# Patient Record
Sex: Male | Born: 1980 | Race: White | Hispanic: No | Smoking: Current every day smoker
Health system: Southern US, Community
[De-identification: ages and names within clinical notes are randomized; demographics above are authoritative.]

## PROBLEM LIST (undated history)

## (undated) VITALS — BP 118/102 | HR 152 | Temp 97.5°F | Resp 20 | Ht 72.0 in | Wt 200.0 lb

## (undated) DIAGNOSIS — J45909 Unspecified asthma, uncomplicated: Secondary | ICD-10-CM

## (undated) DIAGNOSIS — G4730 Sleep apnea, unspecified: Secondary | ICD-10-CM

## (undated) DIAGNOSIS — F329 Major depressive disorder, single episode, unspecified: Secondary | ICD-10-CM

## (undated) DIAGNOSIS — K219 Gastro-esophageal reflux disease without esophagitis: Secondary | ICD-10-CM

## (undated) DIAGNOSIS — I219 Acute myocardial infarction, unspecified: Secondary | ICD-10-CM

## (undated) DIAGNOSIS — E119 Type 2 diabetes mellitus without complications: Secondary | ICD-10-CM

## (undated) DIAGNOSIS — M1990 Unspecified osteoarthritis, unspecified site: Secondary | ICD-10-CM

## (undated) DIAGNOSIS — F419 Anxiety disorder, unspecified: Secondary | ICD-10-CM

## (undated) DIAGNOSIS — E785 Hyperlipidemia, unspecified: Secondary | ICD-10-CM

## (undated) DIAGNOSIS — F603 Borderline personality disorder: Secondary | ICD-10-CM

## (undated) DIAGNOSIS — Q249 Congenital malformation of heart, unspecified: Secondary | ICD-10-CM

## (undated) DIAGNOSIS — T148XXA Other injury of unspecified body region, initial encounter: Secondary | ICD-10-CM

## (undated) DIAGNOSIS — R569 Unspecified convulsions: Secondary | ICD-10-CM

## (undated) DIAGNOSIS — M272 Inflammatory conditions of jaws: Secondary | ICD-10-CM

## (undated) DIAGNOSIS — F319 Bipolar disorder, unspecified: Secondary | ICD-10-CM

## (undated) DIAGNOSIS — I10 Essential (primary) hypertension: Secondary | ICD-10-CM

## (undated) DIAGNOSIS — F32A Depression, unspecified: Secondary | ICD-10-CM

## (undated) DIAGNOSIS — I2510 Atherosclerotic heart disease of native coronary artery without angina pectoris: Secondary | ICD-10-CM

## (undated) HISTORY — PX: TONSILLECTOMY: SUR1361

## (undated) HISTORY — PX: OTHER SURGICAL HISTORY: SHX169

## (undated) HISTORY — PX: UVULECTOMY: SHX2631

## (undated) HISTORY — PX: APPENDECTOMY: SHX54

## (undated) HISTORY — PX: CARDIAC CATHETERIZATION: SHX172

---

## 2001-09-09 ENCOUNTER — Encounter: Payer: Self-pay | Admitting: Emergency Medicine

## 2001-09-09 ENCOUNTER — Emergency Department (HOSPITAL_COMMUNITY): Admission: EM | Admit: 2001-09-09 | Discharge: 2001-09-09 | Payer: Self-pay | Admitting: Emergency Medicine

## 2001-12-01 ENCOUNTER — Ambulatory Visit (HOSPITAL_COMMUNITY): Admission: RE | Admit: 2001-12-01 | Discharge: 2001-12-01 | Payer: Self-pay | Admitting: Orthopaedic Surgery

## 2001-12-01 ENCOUNTER — Encounter: Payer: Self-pay | Admitting: Orthopaedic Surgery

## 2002-05-04 ENCOUNTER — Encounter: Payer: Self-pay | Admitting: Emergency Medicine

## 2002-05-04 ENCOUNTER — Emergency Department (HOSPITAL_COMMUNITY): Admission: EM | Admit: 2002-05-04 | Discharge: 2002-05-04 | Payer: Self-pay | Admitting: Emergency Medicine

## 2002-05-07 ENCOUNTER — Emergency Department (HOSPITAL_COMMUNITY): Admission: EM | Admit: 2002-05-07 | Discharge: 2002-05-07 | Payer: Self-pay | Admitting: Emergency Medicine

## 2002-05-07 ENCOUNTER — Encounter: Payer: Self-pay | Admitting: Emergency Medicine

## 2003-02-23 ENCOUNTER — Encounter: Payer: Self-pay | Admitting: Emergency Medicine

## 2003-02-23 ENCOUNTER — Emergency Department (HOSPITAL_COMMUNITY): Admission: EM | Admit: 2003-02-23 | Discharge: 2003-02-23 | Payer: Self-pay | Admitting: Emergency Medicine

## 2003-02-24 ENCOUNTER — Emergency Department (HOSPITAL_COMMUNITY): Admission: EM | Admit: 2003-02-24 | Discharge: 2003-02-24 | Payer: Self-pay | Admitting: Emergency Medicine

## 2003-02-25 ENCOUNTER — Emergency Department (HOSPITAL_COMMUNITY): Admission: EM | Admit: 2003-02-25 | Discharge: 2003-02-25 | Payer: Self-pay | Admitting: Emergency Medicine

## 2003-09-04 ENCOUNTER — Emergency Department (HOSPITAL_COMMUNITY): Admission: EM | Admit: 2003-09-04 | Discharge: 2003-09-04 | Payer: Self-pay | Admitting: Emergency Medicine

## 2004-08-02 ENCOUNTER — Emergency Department (HOSPITAL_COMMUNITY): Admission: EM | Admit: 2004-08-02 | Discharge: 2004-08-03 | Payer: Self-pay | Admitting: Emergency Medicine

## 2004-08-04 ENCOUNTER — Ambulatory Visit: Payer: Self-pay | Admitting: Orthopedic Surgery

## 2004-08-06 ENCOUNTER — Emergency Department (HOSPITAL_COMMUNITY): Admission: EM | Admit: 2004-08-06 | Discharge: 2004-08-06 | Payer: Self-pay | Admitting: Emergency Medicine

## 2004-10-12 ENCOUNTER — Emergency Department (HOSPITAL_COMMUNITY): Admission: EM | Admit: 2004-10-12 | Discharge: 2004-10-13 | Payer: Self-pay | Admitting: Emergency Medicine

## 2004-11-29 ENCOUNTER — Emergency Department (HOSPITAL_COMMUNITY): Admission: EM | Admit: 2004-11-29 | Discharge: 2004-11-29 | Payer: Self-pay | Admitting: Emergency Medicine

## 2005-03-01 ENCOUNTER — Emergency Department (HOSPITAL_COMMUNITY): Admission: EM | Admit: 2005-03-01 | Discharge: 2005-03-01 | Payer: Self-pay | Admitting: Emergency Medicine

## 2005-03-02 ENCOUNTER — Ambulatory Visit (HOSPITAL_COMMUNITY): Admission: RE | Admit: 2005-03-02 | Discharge: 2005-03-02 | Payer: Self-pay | Admitting: Emergency Medicine

## 2005-04-01 ENCOUNTER — Emergency Department (HOSPITAL_COMMUNITY): Admission: EM | Admit: 2005-04-01 | Discharge: 2005-04-01 | Payer: Self-pay | Admitting: Emergency Medicine

## 2005-04-06 ENCOUNTER — Emergency Department (HOSPITAL_COMMUNITY): Admission: EM | Admit: 2005-04-06 | Discharge: 2005-04-06 | Payer: Self-pay | Admitting: *Deleted

## 2005-05-11 ENCOUNTER — Inpatient Hospital Stay (HOSPITAL_COMMUNITY): Admission: RE | Admit: 2005-05-11 | Discharge: 2005-05-18 | Payer: Self-pay | Admitting: Psychiatry

## 2005-05-11 ENCOUNTER — Ambulatory Visit: Payer: Self-pay | Admitting: Psychiatry

## 2005-06-09 ENCOUNTER — Ambulatory Visit: Payer: Self-pay | Admitting: Family Medicine

## 2005-06-10 ENCOUNTER — Ambulatory Visit: Payer: Self-pay | Admitting: Family Medicine

## 2005-06-11 ENCOUNTER — Ambulatory Visit: Payer: Self-pay | Admitting: Family Medicine

## 2005-06-12 ENCOUNTER — Ambulatory Visit: Payer: Self-pay | Admitting: Family Medicine

## 2005-06-15 ENCOUNTER — Ambulatory Visit (HOSPITAL_COMMUNITY): Admission: RE | Admit: 2005-06-15 | Discharge: 2005-06-15 | Payer: Self-pay | Admitting: Family Medicine

## 2005-06-16 ENCOUNTER — Ambulatory Visit: Payer: Self-pay | Admitting: Family Medicine

## 2005-07-16 ENCOUNTER — Ambulatory Visit: Payer: Self-pay | Admitting: Family Medicine

## 2005-08-25 ENCOUNTER — Ambulatory Visit: Payer: Self-pay | Admitting: Family Medicine

## 2005-08-26 ENCOUNTER — Ambulatory Visit (HOSPITAL_COMMUNITY): Admission: RE | Admit: 2005-08-26 | Discharge: 2005-08-26 | Payer: Self-pay | Admitting: Family Medicine

## 2005-09-28 ENCOUNTER — Ambulatory Visit: Payer: Self-pay | Admitting: Family Medicine

## 2005-10-22 ENCOUNTER — Ambulatory Visit (HOSPITAL_COMMUNITY): Admission: RE | Admit: 2005-10-22 | Discharge: 2005-10-22 | Payer: Self-pay | Admitting: Otolaryngology

## 2005-10-30 ENCOUNTER — Ambulatory Visit (HOSPITAL_COMMUNITY): Admission: RE | Admit: 2005-10-30 | Discharge: 2005-10-30 | Payer: Self-pay | Admitting: Urology

## 2005-10-30 ENCOUNTER — Encounter (INDEPENDENT_AMBULATORY_CARE_PROVIDER_SITE_OTHER): Payer: Self-pay | Admitting: Urology

## 2005-11-26 ENCOUNTER — Inpatient Hospital Stay (HOSPITAL_COMMUNITY): Admission: RE | Admit: 2005-11-26 | Discharge: 2005-11-28 | Payer: Self-pay | Admitting: Otolaryngology

## 2005-12-02 ENCOUNTER — Emergency Department (HOSPITAL_COMMUNITY): Admission: EM | Admit: 2005-12-02 | Discharge: 2005-12-02 | Payer: Self-pay | Admitting: Emergency Medicine

## 2005-12-28 ENCOUNTER — Ambulatory Visit: Payer: Self-pay | Admitting: Family Medicine

## 2006-04-19 ENCOUNTER — Ambulatory Visit: Payer: Self-pay | Admitting: Family Medicine

## 2006-07-15 ENCOUNTER — Emergency Department (HOSPITAL_COMMUNITY): Admission: EM | Admit: 2006-07-15 | Discharge: 2006-07-15 | Payer: Self-pay | Admitting: Emergency Medicine

## 2006-10-14 ENCOUNTER — Emergency Department (HOSPITAL_COMMUNITY): Admission: EM | Admit: 2006-10-14 | Discharge: 2006-10-14 | Payer: Self-pay | Admitting: Emergency Medicine

## 2006-11-26 ENCOUNTER — Emergency Department (HOSPITAL_COMMUNITY): Admission: EM | Admit: 2006-11-26 | Discharge: 2006-11-26 | Payer: Self-pay | Admitting: Emergency Medicine

## 2006-12-05 ENCOUNTER — Emergency Department (HOSPITAL_COMMUNITY): Admission: EM | Admit: 2006-12-05 | Discharge: 2006-12-05 | Payer: Self-pay | Admitting: Emergency Medicine

## 2008-07-28 ENCOUNTER — Emergency Department (HOSPITAL_COMMUNITY): Admission: EM | Admit: 2008-07-28 | Discharge: 2008-07-28 | Payer: Self-pay | Admitting: Emergency Medicine

## 2008-07-29 ENCOUNTER — Emergency Department (HOSPITAL_COMMUNITY): Admission: EM | Admit: 2008-07-29 | Discharge: 2008-07-29 | Payer: Self-pay | Admitting: Emergency Medicine

## 2008-08-31 ENCOUNTER — Ambulatory Visit (HOSPITAL_COMMUNITY): Admission: RE | Admit: 2008-08-31 | Discharge: 2008-08-31 | Payer: Self-pay | Admitting: Family Medicine

## 2008-09-07 ENCOUNTER — Ambulatory Visit (HOSPITAL_COMMUNITY): Admission: RE | Admit: 2008-09-07 | Discharge: 2008-09-07 | Payer: Self-pay | Admitting: Family Medicine

## 2008-09-14 ENCOUNTER — Emergency Department (HOSPITAL_COMMUNITY): Admission: EM | Admit: 2008-09-14 | Discharge: 2008-09-14 | Payer: Self-pay | Admitting: Emergency Medicine

## 2008-10-11 ENCOUNTER — Encounter (HOSPITAL_COMMUNITY): Admission: RE | Admit: 2008-10-11 | Discharge: 2008-11-10 | Payer: Self-pay | Admitting: Endocrinology

## 2008-12-26 ENCOUNTER — Emergency Department (HOSPITAL_COMMUNITY): Admission: EM | Admit: 2008-12-26 | Discharge: 2008-12-26 | Payer: Self-pay | Admitting: Emergency Medicine

## 2009-01-03 ENCOUNTER — Encounter: Admission: RE | Admit: 2009-01-03 | Discharge: 2009-01-03 | Payer: Self-pay | Admitting: Endocrinology

## 2009-01-11 ENCOUNTER — Emergency Department (HOSPITAL_COMMUNITY): Admission: EM | Admit: 2009-01-11 | Discharge: 2009-01-11 | Payer: Self-pay | Admitting: Emergency Medicine

## 2009-01-27 ENCOUNTER — Emergency Department (HOSPITAL_COMMUNITY): Admission: EM | Admit: 2009-01-27 | Discharge: 2009-01-27 | Payer: Self-pay | Admitting: Emergency Medicine

## 2009-01-29 ENCOUNTER — Emergency Department (HOSPITAL_COMMUNITY): Admission: EM | Admit: 2009-01-29 | Discharge: 2009-01-29 | Payer: Self-pay | Admitting: Emergency Medicine

## 2009-03-04 ENCOUNTER — Emergency Department (HOSPITAL_COMMUNITY): Admission: EM | Admit: 2009-03-04 | Discharge: 2009-03-04 | Payer: Self-pay | Admitting: Emergency Medicine

## 2009-05-06 ENCOUNTER — Emergency Department (HOSPITAL_COMMUNITY): Admission: EM | Admit: 2009-05-06 | Discharge: 2009-05-07 | Payer: Self-pay | Admitting: Emergency Medicine

## 2009-05-07 ENCOUNTER — Ambulatory Visit: Payer: Self-pay | Admitting: *Deleted

## 2009-05-07 ENCOUNTER — Inpatient Hospital Stay (HOSPITAL_COMMUNITY): Admission: AD | Admit: 2009-05-07 | Discharge: 2009-05-13 | Payer: Self-pay | Admitting: *Deleted

## 2009-05-28 ENCOUNTER — Observation Stay (HOSPITAL_COMMUNITY): Admission: EM | Admit: 2009-05-28 | Discharge: 2009-05-29 | Payer: Self-pay | Admitting: Emergency Medicine

## 2009-06-01 ENCOUNTER — Inpatient Hospital Stay (HOSPITAL_COMMUNITY): Admission: AD | Admit: 2009-06-01 | Discharge: 2009-06-05 | Payer: Self-pay | Admitting: *Deleted

## 2009-08-29 ENCOUNTER — Encounter: Payer: Self-pay | Admitting: Family Medicine

## 2009-11-20 ENCOUNTER — Emergency Department (HOSPITAL_COMMUNITY): Admission: EM | Admit: 2009-11-20 | Discharge: 2009-11-20 | Payer: Self-pay | Admitting: Emergency Medicine

## 2009-12-12 ENCOUNTER — Emergency Department (HOSPITAL_COMMUNITY): Admission: EM | Admit: 2009-12-12 | Discharge: 2009-12-12 | Payer: Self-pay | Admitting: Emergency Medicine

## 2009-12-14 ENCOUNTER — Emergency Department (HOSPITAL_COMMUNITY): Admission: EM | Admit: 2009-12-14 | Discharge: 2009-12-14 | Payer: Self-pay | Admitting: Emergency Medicine

## 2009-12-17 ENCOUNTER — Emergency Department (HOSPITAL_COMMUNITY): Admission: EM | Admit: 2009-12-17 | Discharge: 2009-12-17 | Payer: Self-pay | Admitting: Emergency Medicine

## 2009-12-18 ENCOUNTER — Emergency Department (HOSPITAL_COMMUNITY): Admission: EM | Admit: 2009-12-18 | Discharge: 2009-12-19 | Payer: Self-pay | Admitting: Emergency Medicine

## 2009-12-19 ENCOUNTER — Inpatient Hospital Stay (HOSPITAL_COMMUNITY): Admission: AD | Admit: 2009-12-19 | Discharge: 2009-12-23 | Payer: Self-pay | Admitting: Psychiatry

## 2009-12-19 ENCOUNTER — Ambulatory Visit: Payer: Self-pay | Admitting: Psychiatry

## 2010-02-13 ENCOUNTER — Emergency Department (HOSPITAL_COMMUNITY): Admission: EM | Admit: 2010-02-13 | Discharge: 2010-02-13 | Payer: Self-pay | Admitting: Emergency Medicine

## 2010-10-12 ENCOUNTER — Encounter: Payer: Self-pay | Admitting: Emergency Medicine

## 2010-12-14 LAB — COMPREHENSIVE METABOLIC PANEL
AST: 40 U/L — ABNORMAL HIGH (ref 0–37)
BUN: 11 mg/dL (ref 6–23)
CO2: 30 mEq/L (ref 19–32)
Chloride: 102 mEq/L (ref 96–112)
Creatinine, Ser: 1.28 mg/dL (ref 0.4–1.5)
GFR calc Af Amer: >60 mL/min (ref >60)
GFR calc non Af Amer: >60 mL/min (ref >60)
Glucose, Bld: 92 mg/dL (ref 70–99)
Total Bilirubin: 0.5 mg/dL (ref 0.3–1.2)

## 2010-12-14 LAB — URINALYSIS, ROUTINE W REFLEX MICROSCOPIC
Bilirubin Urine: NEGATIVE
Ketones, ur: NEGATIVE mg/dL
Nitrite: NEGATIVE
Protein, ur: NEGATIVE mg/dL
Specific Gravity, Urine: 1.01 (ref 1.005–1.030)
Specific Gravity, Urine: 1.005 (ref 1.005–1.030)
Urobilinogen, UA: 0.2 mg/dL (ref 0.0–1.0)
Urobilinogen, UA: 0.2 mg/dL (ref 0.0–1.0)

## 2010-12-14 LAB — BASIC METABOLIC PANEL
Chloride: 105 mEq/L (ref 96–112)
GFR calc Af Amer: >60 mL/min (ref >60)
Potassium: 4.1 mEq/L (ref 3.5–5.1)

## 2010-12-14 LAB — CBC
HCT: 42.5 % (ref 39.0–52.0)
Hemoglobin: 15.3 g/dL (ref 13.0–17.0)
MCV: 96.9 fL (ref 78.0–100.0)
RBC: 4.39 MIL/uL (ref 4.22–5.81)
WBC: 10.5 10*3/uL (ref 4.0–10.5)

## 2010-12-14 LAB — RAPID URINE DRUG SCREEN, HOSP PERFORMED
Barbiturates: NOT DETECTED
Opiates: POSITIVE — AB
Opiates: NOT DETECTED
Tetrahydrocannabinol: NOT DETECTED

## 2010-12-14 LAB — DIFFERENTIAL
Basophils Absolute: 0 10*3/uL (ref 0.0–0.1)
Eosinophils Relative: 1 % (ref 0–5)
Lymphocytes Relative: 15 % (ref 12–46)
Neutrophils Relative %: 78 % — ABNORMAL HIGH (ref 43–77)

## 2010-12-14 LAB — LITHIUM LEVEL: Lithium Lvl: 0.54 mEq/L — ABNORMAL LOW (ref 0.80–1.40)

## 2010-12-26 LAB — BASIC METABOLIC PANEL
Calcium: 8.8 mg/dL (ref 8.4–10.5)
GFR calc Af Amer: >60 mL/min (ref >60)
GFR calc non Af Amer: >60 mL/min (ref >60)
Sodium: 139 mEq/L (ref 135–145)

## 2010-12-26 LAB — CBC
Hemoglobin: 15.1 g/dL (ref 13.0–17.0)
RBC: 4.24 MIL/uL (ref 4.22–5.81)

## 2010-12-26 LAB — T3, FREE: T3, Free: 2.9 pg/mL (ref 2.3–4.2)

## 2010-12-26 LAB — DIFFERENTIAL
Basophils Absolute: 0 10*3/uL (ref 0.0–0.1)
Lymphocytes Relative: 23 % (ref 12–46)
Monocytes Absolute: 0.8 10*3/uL (ref 0.1–1.0)
Monocytes Relative: 10 % (ref 3–12)
Neutro Abs: 5.3 10*3/uL (ref 1.7–7.7)

## 2010-12-26 LAB — T4, FREE: Free T4: 0.59 ng/dL — ABNORMAL LOW (ref 0.80–1.80)

## 2010-12-27 LAB — CBC
HCT: 45.6 % (ref 39.0–52.0)
Hemoglobin: 16.1 g/dL (ref 13.0–17.0)
MCHC: 35.4 g/dL (ref 30.0–36.0)
MCV: 99 fL (ref 78.0–100.0)
Platelets: 155 10*3/uL (ref 150–400)
RBC: 4.6 MIL/uL (ref 4.22–5.81)
RDW: 12.9 % (ref 11.5–15.5)
WBC: 8.4 10*3/uL (ref 4.0–10.5)

## 2010-12-27 LAB — URINALYSIS, ROUTINE W REFLEX MICROSCOPIC
Bilirubin Urine: NEGATIVE
Nitrite: NEGATIVE
Specific Gravity, Urine: >1.03 — ABNORMAL HIGH (ref 1.005–1.030)
pH: 6 (ref 5.0–8.0)

## 2010-12-27 LAB — BASIC METABOLIC PANEL
BUN: 11 mg/dL (ref 6–23)
CO2: 27 mEq/L (ref 19–32)
Chloride: 104 mEq/L (ref 96–112)
GFR calc non Af Amer: >60 mL/min (ref >60)
Glucose, Bld: 87 mg/dL (ref 70–99)
Potassium: 4 mEq/L (ref 3.5–5.1)

## 2010-12-27 LAB — RAPID URINE DRUG SCREEN, HOSP PERFORMED
Amphetamines: POSITIVE — AB
Barbiturates: NOT DETECTED
Benzodiazepines: NOT DETECTED
Cocaine: NOT DETECTED
Opiates: NOT DETECTED
Tetrahydrocannabinol: POSITIVE — AB

## 2010-12-27 LAB — DIFFERENTIAL
Basophils Absolute: 0 10*3/uL (ref 0.0–0.1)
Basophils Relative: 0 % (ref 0–1)
Eosinophils Absolute: 0 10*3/uL (ref 0.0–0.7)
Eosinophils Relative: 1 % (ref 0–5)
Monocytes Absolute: 0.6 10*3/uL (ref 0.1–1.0)

## 2010-12-27 LAB — ETHANOL

## 2010-12-30 LAB — BASIC METABOLIC PANEL
BUN: 10 mg/dL (ref 6–23)
Calcium: 9.6 mg/dL (ref 8.4–10.5)
Creatinine, Ser: 1.08 mg/dL (ref 0.4–1.5)
GFR calc Af Amer: >60 mL/min (ref >60)

## 2010-12-30 LAB — POCT CARDIAC MARKERS
CKMB, poc: 1.3 ng/mL (ref 1.0–8.0)
Troponin i, poc: <0.05 ng/mL (ref 0.00–0.09)

## 2010-12-31 LAB — DIFFERENTIAL
Basophils Relative: 0 % (ref 0–1)
Eosinophils Absolute: 0.1 10*3/uL (ref 0.0–0.7)
Eosinophils Relative: 1 % (ref 0–5)
Lymphs Abs: 1.6 10*3/uL (ref 0.7–4.0)
Monocytes Absolute: 0.4 10*3/uL (ref 0.1–1.0)
Monocytes Relative: 6 % (ref 3–12)

## 2010-12-31 LAB — POCT I-STAT, CHEM 8
BUN: <3 mg/dL — ABNORMAL LOW (ref 6–23)
Calcium, Ion: 1.17 mmol/L (ref 1.12–1.32)
Chloride: 107 mEq/L (ref 96–112)
HCT: 47 % (ref 39.0–52.0)
Sodium: 143 mEq/L (ref 135–145)

## 2010-12-31 LAB — COMPREHENSIVE METABOLIC PANEL
ALT: 28 U/L (ref 0–53)
AST: 28 U/L (ref 0–37)
Albumin: 4.3 g/dL (ref 3.5–5.2)
Alkaline Phosphatase: 67 U/L (ref 39–117)
Calcium: 9.5 mg/dL (ref 8.4–10.5)
GFR calc Af Amer: >60 mL/min (ref >60)
Glucose, Bld: 94 mg/dL (ref 70–99)
Potassium: 3.9 mEq/L (ref 3.5–5.1)
Sodium: 141 mEq/L (ref 135–145)
Total Protein: 7 g/dL (ref 6.0–8.3)

## 2010-12-31 LAB — CBC
Hemoglobin: 16.1 g/dL (ref 13.0–17.0)
MCHC: 35.6 g/dL (ref 30.0–36.0)
Platelets: 152 10*3/uL (ref 150–400)
RDW: 12.7 % (ref 11.5–15.5)

## 2010-12-31 LAB — POCT CARDIAC MARKERS
CKMB, poc: <1 ng/mL — ABNORMAL LOW (ref 1.0–8.0)
Troponin i, poc: <0.05 ng/mL (ref 0.00–0.09)

## 2010-12-31 LAB — TRICYCLICS SCREEN, URINE: TCA Scrn: NOT DETECTED

## 2010-12-31 LAB — URINE CULTURE: Culture: NO GROWTH

## 2010-12-31 LAB — ETHANOL: Alcohol, Ethyl (B): 139 mg/dL — ABNORMAL HIGH (ref 0–10)

## 2010-12-31 LAB — RAPID URINE DRUG SCREEN, HOSP PERFORMED
Barbiturates: NOT DETECTED
Cocaine: NOT DETECTED

## 2010-12-31 LAB — URINALYSIS, ROUTINE W REFLEX MICROSCOPIC
Glucose, UA: NEGATIVE mg/dL
Ketones, ur: NEGATIVE mg/dL
Nitrite: NEGATIVE
Protein, ur: NEGATIVE mg/dL
pH: 7 (ref 5.0–8.0)

## 2010-12-31 LAB — D-DIMER, QUANTITATIVE: D-Dimer, Quant: <0.22 ug/mL-FEU (ref 0.00–0.48)

## 2011-02-03 NOTE — Discharge Summary (Signed)
[name], Jermaine [name] NO.:  [id number]   MEDICAL RECORD NO.:  [id number]          PATIENT TYPE:  IPS   LOCATION:  0501                          FACILITY:  [facility]   PHYSICIAN:  Anselm Jungling, MD  DATE OF BIRTH:  [birth year]   DATE OF ADMISSION:  05/07/2009  DATE OF DISCHARGE:  05/13/2009                               DISCHARGE SUMMARY   IDENTIFYING DATA AND REASON FOR ADMISSION:  This was an inpatient  psychiatric admission for Jermaine [name], a 30 year old married Caucasian male,  who presented for stabilization of severe mood disturbance, including  depression and extreme anger management problems.  Please refer to the  admission note for further details pertaining to the symptoms,  circumstances. and history that led to his hospitalization.  He was  given an initial Axis I diagnosis of mood disorder NOS, cannabis  dependence, and rule out bipolar disorder NOS.   MEDICAL AND LABORATORY:  The patient was medically and physically  assessed by the psychiatric nurse practitioner.  He was in good health  without any active or chronic medical problems.  There were no  significant medical issues.   HOSPITAL COURSE:  The patient was admitted to the adult inpatient  psychiatric service.  He presented as a well-nourished, normally-  developed male who was alert, fully oriented, and depressed, but without  any active suicidal ideation, plan or intent.  He stated that he has  been depressed all of his life.  He also admitted to extreme  irritability, inappropriately rageful responses, and asked for help with  this.  There were no signs or symptoms of psychosis.  He admitted to  regular cannabis use, which he stated was helpful to his mood.   The patient consented to a trial of medication to address what appeared  to be an atypical bipolar disorder characterized by severe anger and  irritability as well as depression.  He responded reasonably well to a  trial of Seroquel, in  combination with Depakote.  He had come to [place] on a  trial of Zoloft from his outside [place] [facility].  This was discontinued, as  there was concern that it might have been increasing his level of  irritability as well as contributing to his admission insomnia.   He participated in therapeutic groups and activities.  His level of  irritability diminished significantly during his stay.  There was a  family session involving his girlfriend and mother, which was fairly  beneficial even though the patient's irritation did come out at times  during that session.   On the 7th hospital day, the patient appeared appropriate for discharge.  He agreed to the following aftercare plan.  He was absent suicidal  ideation at the time of discharge.   AFTERCARE:  The patient was to follow up at [facility] with an appointment on May 15, 2009, at 8:00 a.m.   DISCHARGE MEDICATIONS:  1. Seroquel 100 mg q.a.m. and 200 mg q.h.s.  2. Inderal 80 mg b.i.d.  3. Depakote 750 mg b.i.d.  4. Zyprexa Zydis 5 mg q.6 h as needed  for agitation.   He was instructed to stop taking Zoloft.   He was instructed to also consult with his usual medical physician about  his history of intermittent hypertension and the advisability of  continued use of Inderal for this.   DISCHARGE DIAGNOSES:  AXIS I:  Bipolar disorder, atypical, NOS, and  cannabis abuse.  AXIS II:  Deferred.  AXIS III:  Hypertension.  AXIS IV:  Stressors severe.  AXIS V:  GAF on discharge 50.      Anselm Jungling, MD  Electronically Signed     SPB/MEDQ  D:  05/15/2009  T:  05/15/2009  Job:  [id number]

## 2011-02-06 NOTE — Op Note (Signed)
Jermaine [name], Jermaine [name]                ACCOUNT NO.:  [id number]   MEDICAL RECORD NO.:  [id number]          PATIENT TYPE:  AMB   LOCATION:  SDS                          FACILITY:  [facility]   PHYSICIAN:  Zola Button T. Wolicki, M.D. DATE OF BIRTH:  [birth year]   DATE OF PROCEDURE:  11/26/2005  DATE OF DISCHARGE:                                 OPERATIVE REPORT   PREOPERATIVE DIAGNOSIS:  Nasal septal deviation with obstruction,  hypertrophic inferior turbinates with obstruction, hypertrophic tonsils with  obstruction, obstructive sleep apnea.   POSTOPERATIVE DIAGNOSIS:  Nasal septal deviation with obstruction,  hypertrophic inferior turbinates with obstruction, hypertrophic tonsils with  obstruction, obstructive sleep apnea.   PROCEDURE PERFORMED:  1.  Nasal septoplasty.  2.  Bilateral submucous resection of inferior turbinates.  3.  Lysis, intranasal synechia.  4.  Uvulopalatal pharyngoplasty.  5.  Tonsillectomy.   SURGEON:  Gloris Manchester. Lazarus Salines, M.D.   ANESTHESIA:  General orotracheal.   BLOOD LOSS:  Minimal.   COMPLICATIONS:  None.   FINDINGS:  An overall mildly rightward deviated nasal septum with some heavy  synechial bands between the septum and the left inferior turbinate. Bulky  inferior turbinates bilaterally.  3+ tonsils with a long thick but otherwise  normal soft palate.  No residual adenoids.   PROCEDURE:  With the patient in the comfortable supine position, general  orotracheal anesthesia was induced without difficulty. At an appropriate  level, the table was turned 90 degrees and the patient placed in a slight  sitting position.  A saline moistened throat pack was placed. Nasal  vibrissae were trimmed.  4% cocaine solution, 160 mg total, was applied on  1/2 by 3 inch cottonoids to both sides of the nasal septum. Cocaine  crystals, 200 mg total, were applied on cotton carriers to the anterior  ethmoid and sphenopalatine ganglion regions. Finally, 1% Xylocaine with  1:100,000  epinephrine, 10 mL total, was infiltrated into the anterior floor  of the nose, into the nasal spine region, into the membranous columella, and  into the anterior pole of the inferior turbinates, and into the  submucoperichondrial plane of the septum on both sides.  Several minutes  were allowed for this to take effect.  A sterile preparation and draping of  the midface was accomplished.   The materials were removed.  The nose appeared to be intact and correct in  number. The findings were as described above.  3-4 synechial bands in the  left nose were lysed on the turbinate side, leaving adequate mucosa on the  septal side to avoid perforation.   Two floor incisions were made anteriorly and on the left side, it was  continued up as a hemitransfixion incision.  Floor tunnels were elevated  posteriorly and brought medially to the maxillary crest and then brought  forward.  The submucoperichondrial plane of the left septum was elevated up  to the dorsum of the nose, back onto the perpendicular plate, and brought  down to the floor of the nose and communicated with the floor tunnel and  brought forward.  The septal flap was raised intact.  The chondroethmoid  junction was identified and opened with a Risk analyst.  The opposite  submucoperiosteal plane of the perpendicular plate was elevated back to the  dorsum and carried down and communicated with the right floor tunnel.  The  superior perpendicular plate was lysed with open Jansen-Middleton forceps.  The inferior portion was dissected free, rocked, delivered, and discarded.  Bony spicules were carefully removed and there was a small spur on the left  side posteriorly.  At this point, a 2 mm strip of the inferior quadrangular  cartilage was incised and submucosally resected allowing the septum to  trapdoor into the midline more freely.  A small portion of the posterior  inferior corner of the quadrangular cartilage was submucosally  resected  where it was spurred to the right.  This left approximately 2.5 cm dorsal  and caudal struts.  The maxillary crest posterior to the caudal strut was  resected using a mallet and osteotome.  At this point,  the septum was  straight and easily brought into the midline, it was secured to the nasal  spine with figure-of-eight 4-0 PDS suture.  The septal tunnels were  carefully suctioned free, the flaps were laid back down intact, and the  several incisions were closed with interrupted 4-0 chromic suture.   Just prior to completing the septoplasty, the inferior turbinates were  infiltrated with a total of 6 mL of 1% Xylocaine with 1:100,000 epinephrine.  After completing the septoplasty, beginning on the right side, the anterior  hood of the inferior turbinate was lysed just behind the nasal valve. The  medial mucosa was incised in an anterior upsloping fashion and a laterally  based flap was developed.  The turbinate was infractured.  Using angled  turbinate scissors, the turbinate bone and lateral mucosa was resected in a  posterior downsloping fashion taking virtually all the anterior pole leaving  virtually all the posterior pole. Additional bony spicules were dissected  and removed. The bulbous posterior pole was suction coagulated as were the  cut mucosal edges. The flap was laid back down, the turbinate was  outfractured, and this side was completed.  After completing the right side,  the left side was done in identical fashion.   At this point, 0.040 reinforced Silastic splints were fashioned and placed  against the septum on both sides and secured thereto with a 3-0 nylon  stitch. A double thickness Neosporin impregnated Telfa pack was placed along  the inferior turbinate on each side. A 6.5 mm nasal trumpet which had been  shortened to reach just into the nasopharynx was placed in each side to allow some postoperative nasal airway. This completed the nasal portion of  the  procedure. Hemostasis was observed.   At this point, the patient was flattened and then placed in Trendelenburg.  The pharynx was suctioned clean and the throat pack was removed. Taking care  to protect lips, teeth, and endotracheal tube, the Crowe-Davis mouth gag was  introduced, expanded for visualization, and suspended from the Mayo stand in  the standard fashion. The findings were as described above. Palate retractor  and mirror were used to visualize the nasopharynx with no significant  residual adenoids. 0.5% Xylocaine with 1:200,000 epinephrine, 10 mL total  was infiltrated into the peritonsillar planes and into the soft palate. The  midline palatal tattoo mark placed in the office was identified.   After allowing several minutes for hemostasis to take effect, the cautery  was used to mark an incision  along the anterior tonsillar pillar on both  sides vertically and then communicating across the palate in an arch  fashion. An inverted V was planned around the tattoo mark to prevent scar  contracture.   Beginning on the left side, the tonsil was grasped and retracted medially.  Working through the previously marked incision, the dissection was carried  down through the anterior pillar to the capsule of the tonsil at which point  the dissection was carried on the capsule. The cautery tip was used with a  blunt dissector, fibrous bands were lysed, and crossing vessels were  coagulated as identified. The tonsil was dissected first at its inferior  pole and then brought upwards into the soft palate following the previously  marked arch but remaining by the soft palatal tissues. After freeing the  left tonsil, the right side was done in identical fashion.   Working across the arch of the soft palate, the dissection was carried out  with Bovie cautery directly through the palatal soft tissues and then  working back and forth from each tonsil up onto the posterior surface. An  elongated  flap of mucosa was left on the posterior surface of the uvula to  be brought forward. Hemostasis was observed. The two tonsils and the soft  palate were resected en block and sent for gross interpretation.   At this point, the posterior uvular mucosa was tucked into the anterior  Chevron with interrupted 4-0 Vicryl sutures. The posterior mucosa of the  soft palate was brought up and tethered slightly laterally. A relaxing  incision was made on the posterior mucosal surface of the soft palate to  allow the posterior pillars to be brought more laterally. The free edge of  the soft palate was closed with interrupted 4-0 Vicryl sutures and the  tonsil fossae were closed with the same material.  A good closure with very  slight horizontal banding of the posterior pharyngeal mucosa was  accomplished.  Mirror examination revealed a good configuration to the free  edge of the soft palate. Hemostasis was observed.  At this point, the Crowe-Davis gag was relaxed for several minutes. Upon re-  expansion, hemostasis was persistent. At this point the procedure was  completed. The mouth gag was relaxed and removed. The dental status was  intact. The patient was returned to anesthesia, awakened, extubated, and  transferred to recovery in stable condition.   COMMENT:  31 year old white male with a history of loud snoring, nasal  obstruction, and documented sleep apnea was indication for several  components of today's procedure. Anticipated routine postoperative recovery  with attention to analgesia, antibiosis, hydration, and observation for  bleeding, emesis, and airway compromise. Given the nature of the surgery on  both the oral and nasal airway as well as the patient's history of sleep  apnea, will observe him in one of the step-down units tonight. Anticipate  removal of packs in the morning and discharge home when he is able to  breathe adequately, eat and drink adequately, and achieve adequate  pain  control.      Gloris Manchester. Lazarus Salines, M.D.  Electronically Signed     KTW/MEDQ  D:  11/26/2005  T:  11/26/2005  Job:  [id number]   cc:   Dorthula Rue. Early Chars, MD  Fax: [phone]   Dani Gobble, MD  Fax: [phone]   Kofi A. Gerilyn Pilgrim, M.D.  Fax: [phone]

## 2011-02-06 NOTE — H&P (Signed)
NAMENILO, FALLIN NO.:  [id number]   MEDICAL RECORD NO.:  [id number]          PATIENT TYPE:  IPS   LOCATION:  0306                          [facility]ILITY[facility]  [facility]   PHYSICIAN:  Jermaine [name], M.D.      DATE OF BIRTH:  [birth year]   DATE OF ADMISSION:  05/11/2005  DATE OF DISCHARGE:                         PSYCHIATRIC ADMISSION ASSESSMENT   IDENTIFYING INFORMATION:  A 30 year old married white male, voluntarily  admitted on May 11, 2005.   HISTORY OF PRESENT ILLNESS:  The patient presents with a history of  depression and suicidal thoughts with a plan to cut his wrists.  The patient  has been using crack cocaine for the past 2 years, drinking alcohol and  smoking marijuana.  He has been drinking 6-12 beers daily.  His last drink  was on May 10, 2005.  The patient states that he keeps thinking about  killing himself, and recently cut himself with a razor blade.  He reports  severe mood swings.  He feels that he is just ready to explode.  The patient  reports only sleeping a few hours at a time.  His appetite has been  satisfactory, denies any psychotic symptoms.   PAST PSYCHIATRIC HISTORY:  First admission to [facility], has  a history of cutting his wrists at age of 51, has a history of bipolar  disorder and ADHD.   SOCIAL HISTORY:  He is a 30 year old married white male, has 2 children,  ages 70 and 1 year of age.  Lives with his wife and children.  He is  unemployed for the past month.  Has a DUI pending and driving with a revoked  license.   FAMILY HISTORY:  Sister with bipolar, mother is depressed.   ALCOHOL DRUG HISTORY:  The patient smokes, he reports drinking in the  morning, with blackouts, denies any seizure activity, and has been smoking  marijuana and crack cocaine.   PAST MEDICAL HISTORY:  Primary care Alyana Kreiter is none, medical problems are  hypertension, acid reflux and asthma.   MEDICATIONS:  Takes an albuterol inhaler, reports  no hypertension  medications at this time.   DRUG ALLERGIES:  No known allergies.   PHYSICAL EXAMINATION:  The patient was assessed at [facility].  He is a well-  nourished male, well-developed, in no acute distress.  He does have multiple  tattoos.  He has a healed scar to his right wrist and some superficial  lacerations to his left forearm where he states he cut himself.  Temperature  is 98.3, 93 heart rate, 18 respirations, blood pressure is 140/110.  Urine  drug screen is positive for cocaine, THC and benzodiazepines.  CBC is within  normal limits.  Alcohol level is 38.  His BMET was within normal limits.   MENTAL STATUS EXAM:  He is a fully alert male, cooperative, good eye  contact.  Speech is clear, normal pace and tone.  The patient feels anxious  and tense.  His affect is anxious with some mild irritability.  Thought  processes are coherent.  Cognitive function intact.  Memory  is good,  judgment is fair, insight is fair, poor impulse control.   ADMISSION DIAGNOSES:  AXIS I:  Rule out bipolar disorder, polysubstance  abuse.  AXIS II:  Deferred.  AXIS III: Acid reflux, asthma and hypertension per patient.  AXIS IV:  Problems with primary support group, occupation and other  psychosocial problems related to substance abuse, and legal system.  AXIS V:  Current is 30, past year 60-65.   PLAN:  Plan is to stabilize mood and thinking, work on relapse prevention.  We will initiate a mood stabilizer.  Medication compliance will be  reinforced.  We will have a family session with his wife.  Case manager is  to look at any potential rehab programs available to the patient.  The  patient is to follow up with mental health.   TENTATIVE LENGTH OF CARE:  5-6 days.      Jermaine [name], N.P.      Jermaine [name], M.D.  Electronically Signed    JO/MEDQ  D:  05/14/2005  T:  05/14/2005  Job:  [id number]

## 2011-02-06 NOTE — Discharge Summary (Signed)
Jermaine [name], GEERS NO.:  [id number]   MEDICAL RECORD NO.:  [id number]          PATIENT TYPE:  IPS   LOCATION:  0306                          FACILITY:  [facility]   PHYSICIAN:  Geoffery Lyons, M.D.      DATE OF BIRTH:  Jan 29, 1981   DATE OF ADMISSION:  05/11/2005  DATE OF DISCHARGE:  05/18/2005                                 DISCHARGE SUMMARY   CHIEF COMPLAINT AND PRESENT ILLNESS:  This was the first admission to [facility] for this 30 year old married white male voluntarily  admitted.  History of depression, suicidal thoughts with a plan to cut his  wrist.  Had been using crack cocaine for the past two years.  Drinking  alcohol and smoking marijuana.  Had been drinking 6-12 beers daily.  Last  drink was on August 20th.  Has kept thinking about killing himself.  Recently cut himself with a razor blade.  Reports severe mood swings.  Felt  that he was ready to explode.  Sleeping only a few hours.   PAST PSYCHIATRIC HISTORY:  First time at [facility].  Has of cutting  his wrist at age 1.  History of bipolar disorder and ADHD.   ALCOHOL/DRUG HISTORY:  Smokes.  Drinking in the morning and blackouts.  Has  been smoking marijuana and crack cocaine.   MEDICAL HISTORY:  Hypertension, acid reflux, asthma.   MEDICATIONS:  Albuterol inhaler and no active medication for his blood  pressure.   PHYSICAL EXAMINATION:  Performed and failed to show any acute findings  except healed scars to his right wrist and some superficial laceration to  his left forearm.   LABORATORY DATA:  Blood chemistry with glucose 93.  CBC within normal  limits.  BMET within normal limits.  Urine drug screen positive for cocaine.   MENTAL STATUS EXAM:  Fully alert, cooperative male with good eye contact.  Speech was clear, normal pace, tempo and production.  Feeling anxious and  tense.  Affect was anxious, some mild irritability.  Thought processes were  logical, coherent and  relevant.  No delusions.  No active suicidal or  homicidal ideation.  Cognition was well-preserved.   ADMISSION DIAGNOSES:  AXIS I:  Bipolar disorder.  Polysubstance abuse.  AXIS II:  No diagnosis.  AXIS III:  Acid reflux, asthma, hypertension.  AXIS IV:  Moderate.  AXIS V:  GAF upon admission 30; highest GAF in the last year 60-65.   HOSPITAL COURSE:  He was admitted.  He was started in individual and group  psychotherapy.  He was given Symmetrel for cravings, detoxified with  Librium, given Ambien.  Was switched to Risperdal and Depakote.  He  developed an abscess tooth.  He was given Ultram.  When that was not  effective, he was given Vicodin and Anbesol.  Risperdal was eventually  discontinued.  He was placed on Seroquel 100 mg at night.  He was placed on  Depakote 250 mg twice a day and then 250 mg twice a day and at bedtime.  He  endorsed that he was having a  really hard time.  Endorsed that he was  actively using alcohol, crack and marijuana.  Increased signs and symptoms  of depression.  Gotten more depressed, using more alcohol and drugs.  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  Endorsed that he was kicked out of  his house due to the same.  Strong family history of substance use.  Endorsed he wanted to do better.  Wanted to stop using drugs.  Endorsed that  he was having thoughts of hurting himself, although he could contract in the  unit.  Continued to endorse the anxiety.  Worried about the mood swings.  Continued to have a hard time.  Difficulty with sleep, pain from the tooth,  exposed nerve.  Endorsed panic.  Endorsed not feeling right.  Sleep was an  issue.  Lack of energy, lack of motivation, depressed mood.  Endorsed when  feeling like this outside, he would go and use cocaine.  Endorsed that this  state of mind triggers the relapse.  We continued to work to stabilize.  He  was started on Wellbutrin XL 150 mg in the morning as he said the sister  responded well  to the Wellbutrin and he had similar symptoms.  He was also  placed on Seroquel 200 mg at night.  There was a family session with the  wife.  She was able to tell about his loss of control.  He became upset as  he did not respond to him as he expected her to.  Endorsed consistent  depression, a sense of hopelessness and helplessness, especially as he did  not experience the response from wife that he would expect.  [birth year]  [birth year].  He became upset as he had not heard from his family.  Endorsed feeling really down.  By August 28th, he was better.  He said he  was better and he looked better.  Endorsed no suicidal or homicidal  ideation.  No hallucinations.  No delusions.  Was going to pursue the  medications.  Pursue a residential treatment center.  He was going to be  followed up at mental health.  He was also willing to try the Strattera for  the ADHD.   DISCHARGE DIAGNOSES:  AXIS I:  Bipolar disorder not otherwise specified.  Polysubstance abuse.  Attention-deficit hyperactivity disorder.  AXIS II:  No diagnosis.  AXIS III:  Acid reflux, asthma, arterial hypertension.  AXIS IV:  Moderate.  AXIS V:  GAF upon discharge 50.   DISCHARGE MEDICATIONS:  1.  Seroquel 100 mg at 9 p.m.  2.  Wellbutrin XL 300 mg in the morning.  3.  Symmetrel 100 mg twice a day.  4.  Depakote ER 250 mg twice a day and at night.  5.  Strattera 40 mg per day.   FOLLOW UP:  Michell Heinrich.      Geoffery Lyons, M.D.  Electronically Signed     IL/MEDQ  D:  06/13/2005  T:  06/13/2005  Job:  [id number]

## 2011-02-06 NOTE — H&P (Signed)
NAMEWESLEY, DUCRE                ACCOUNT NO.:  [id number]   MEDICAL RECORD NO.:  [id number]           PATIENT TYPE:  AMB   LOCATION:                                FACILITY:  [facility]   PHYSICIAN:  Ky Barban, M.D.DATE OF BIRTH:  05/22/1981   DATE OF ADMISSION:  DATE OF DISCHARGE:  LH                                HISTORY & PHYSICAL   CHIEF COMPLAINT:  Right hydrocele.   HISTORY:  This 30 year old male was referred to me by Dr. Early Chars with swelling  of his right testicle and it was unsure if it was a hydrocele.  The testicle  was normal. He says that it is gradually getting bigger and bothers him.  He  wants something done about this.  I have advised him to undergo  hydrocelectomy.  The procedure and complications are discussed in detail.  He understands and wants me to proceed.   PAST HISTORY:  He has no other medical problems except hypertension; and he  had some liver enzyme elevation, but he says that he does not have that any  more.  He was told that his liver is fine.  He had appendectomy several  years ago.   PERSONAL HISTORY:  He does not smoke or drink.   REVIEW OF SYSTEMS:  Denies any chest pain, orthopnea, PND, nausea or  vomiting.   PHYSICAL EXAMINATION:  VITAL SIGNS:  Blood pressure 132/84, temperature  98.6, internal  CENTRAL NERVOUS SYSTEM:  Negative.  HEAD AND NECK, AND EENT:  Negative.  CHEST:  Symmetrical.  HEART:  Regular sinus rhythm.  ABDOMEN:  Soft and flat.  Liver, spleen, and kidneys are not palpable.  No  CVA tenderness.  GENITOURINARY:  His uncircumcised meatus is adequate.  Testicles are normal.  There is enlargement of the right hemiscrotum which is cystic and  transilluminates.  No inguinal hernia.   IMPRESSION:  1.  Right hydrocele.  2.  Hypertension.   PLAN:  Right hydrocelectomy under anesthesia as an outpatient.      Ky Barban, M.D.  Electronically Signed     MIJ/MEDQ  D:  10/29/2005  T:  10/29/2005  Job:  [id number]   cc:   Dorthula Rue. Early Chars, MD  Fax: [phone]   [facility]  Fax: [phone]

## 2011-02-06 NOTE — Op Note (Signed)
Jermaine [name], Jermaine [name]                ACCOUNT NO.:  [id number]   MEDICAL RECORD NO.:  [id number]          PATIENT TYPE:  AMB   LOCATION:  DAY                           FACILITY:  [facility]   PHYSICIAN:  Ky Barban, M.D.DATE OF BIRTH:  March 18, 1981   DATE OF PROCEDURE:  10/30/2005  DATE OF DISCHARGE:                                 OPERATIVE REPORT   PREOPERATIVE DIAGNOSIS:  Right hydrocele.   POSTOPERATIVE DIAGNOSIS:  Right hydrocele.   PROCEDURE:  Right hydrocelectomy.   ANESTHESIA:  General.   PROCEDURE NOTE:  The patient had general endotracheal anesthesia, placed in  supine position. After usual prep and drape, right hemiscrotum held by the  assistant. Transverse incision across the mid scrotum was made, carried down  through the fascial layers with the help of the cautery. Tunica vaginalis  opened, and clear, amber-colored fluid came out. The testicle was delivered.  The hydrocele sac was everted. The testicle grossly looked normal. The  appendix testicle was removed, and the everted hydrocele sac was imbricated  along the margins of the testicle. After making sure there was complete  hemostasis, testicle was properly placed in the scrotum, and the fascial  layers were closed with continuous stitch of 3-0 Vicryl. Skin was closed  with 3-0 chromic interrupted sutures. Sterile gauze dressing applied. The  patient left the operating room in satisfactory condition.      Ky Barban, M.D.  Electronically Signed     MIJ/MEDQ  D:  10/30/2005  T:  10/30/2005  Job:  [id number]

## 2011-02-14 ENCOUNTER — Emergency Department (HOSPITAL_COMMUNITY)
Admission: EM | Admit: 2011-02-14 | Discharge: 2011-02-14 | Disposition: A | Discharge status: Home / Self Care | Payer: Self-pay | Attending: Emergency Medicine | Admitting: Emergency Medicine

## 2011-02-14 ENCOUNTER — Emergency Department (HOSPITAL_COMMUNITY): Payer: Self-pay

## 2011-02-14 DIAGNOSIS — Y998 Other external cause status: Secondary | ICD-10-CM | POA: Insufficient documentation

## 2011-02-14 DIAGNOSIS — I10 Essential (primary) hypertension: Secondary | ICD-10-CM | POA: Insufficient documentation

## 2011-02-14 DIAGNOSIS — S52509A Unspecified fracture of the lower end of unspecified radius, initial encounter for closed fracture: Secondary | ICD-10-CM | POA: Insufficient documentation

## 2011-02-14 DIAGNOSIS — S52539A Colles' fracture of unspecified radius, initial encounter for closed fracture: Secondary | ICD-10-CM | POA: Insufficient documentation

## 2011-02-14 DIAGNOSIS — X500XXA Overexertion from strenuous movement or load, initial encounter: Secondary | ICD-10-CM | POA: Insufficient documentation

## 2011-02-14 DIAGNOSIS — Y9359 Activity, other involving other sports and athletics played individually: Secondary | ICD-10-CM | POA: Insufficient documentation

## 2011-02-14 DIAGNOSIS — S52609A Unspecified fracture of lower end of unspecified ulna, initial encounter for closed fracture: Secondary | ICD-10-CM | POA: Insufficient documentation

## 2011-02-14 DIAGNOSIS — F319 Bipolar disorder, unspecified: Secondary | ICD-10-CM | POA: Insufficient documentation

## 2011-05-26 ENCOUNTER — Emergency Department (HOSPITAL_COMMUNITY)
Admission: EM | Admit: 2011-05-26 | Discharge: 2011-05-26 | Disposition: A | Discharge status: Home / Self Care | Payer: BC Managed Care – PPO | Attending: Emergency Medicine | Admitting: Emergency Medicine

## 2011-05-26 ENCOUNTER — Encounter: Payer: Self-pay | Admitting: Emergency Medicine

## 2011-05-26 DIAGNOSIS — A084 Viral intestinal infection, unspecified: Secondary | ICD-10-CM

## 2011-05-26 DIAGNOSIS — F17200 Nicotine dependence, unspecified, uncomplicated: Secondary | ICD-10-CM | POA: Insufficient documentation

## 2011-05-26 DIAGNOSIS — A088 Other specified intestinal infections: Secondary | ICD-10-CM | POA: Insufficient documentation

## 2011-05-26 DIAGNOSIS — I10 Essential (primary) hypertension: Secondary | ICD-10-CM | POA: Insufficient documentation

## 2011-05-26 HISTORY — DX: Essential (primary) hypertension: I10

## 2011-05-26 LAB — BASIC METABOLIC PANEL
BUN: 12 mg/dL (ref 6–23)
CO2: 26 mEq/L (ref 19–32)
Chloride: 101 mEq/L (ref 96–112)
Creatinine, Ser: 0.94 mg/dL (ref 0.50–1.35)
Glucose, Bld: 88 mg/dL (ref 70–99)
Potassium: 3.5 mEq/L (ref 3.5–5.1)

## 2011-05-26 MED ORDER — SODIUM CHLORIDE 0.9 % IV BOLUS (SEPSIS)
2000.0000 mL | Freq: Once | INTRAVENOUS | Status: AC
  Administered 2011-05-26: 2000 mL via INTRAVENOUS

## 2011-05-26 MED ORDER — ONDANSETRON HCL 4 MG/2ML IJ SOLN
4.0000 mg | Freq: Once | INTRAMUSCULAR | Status: AC
  Administered 2011-05-26: 4 mg via INTRAVENOUS
  Filled 2011-05-26: qty 2

## 2011-05-26 MED ORDER — ONDANSETRON HCL 4 MG PO TABS: 8.0000 mg | ORAL_TABLET | Freq: Three times a day (TID) | ORAL | Status: AC | PRN
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
## 2011-05-26 NOTE — Discharge Instructions (Signed)
B.R.A.T. Diet    Your doctor has recommended the B.R.A.T. diet for you or your child until the condition improves. This is often used to help control diarrhea and vomiting symptoms. If you or your child can tolerate clear liquids, you may have:   Bananas.   Rice.   Applesauce.   Toast (and other simple starches such as crackers, potatoes, noodles).    Be sure to avoid dairy products, meats, and fatty foods until symptoms are better.  Fruit juices such as apple, grape, and prune juice can make diarrhea worse. Avoid these. Continue this diet for 2 days or as instructed by your caregiver.    Document Released: 09/07/2005  Document Re-Released: 07/05/2007  [employer] Patient Information 2011 [employer].

## 2011-06-23 LAB — URINALYSIS, ROUTINE W REFLEX MICROSCOPIC
Bilirubin Urine: NEGATIVE
Glucose, UA: NEGATIVE
Hgb urine dipstick: NEGATIVE
Nitrite: NEGATIVE
Specific Gravity, Urine: <1.005 — ABNORMAL LOW
pH: 6.5

## 2011-06-23 LAB — DIFFERENTIAL
Basophils Absolute: 0
Basophils Absolute: 0
Basophils Relative: 0
Eosinophils Absolute: 0.1
Eosinophils Relative: 2
Lymphocytes Relative: 35
Monocytes Absolute: 0.7
Monocytes Relative: 9
Neutrophils Relative %: 65

## 2011-06-23 LAB — BASIC METABOLIC PANEL
BUN: 7
CO2: 25
CO2: 27
Calcium: 9.3
Chloride: 102
Chloride: 99
Creatinine, Ser: 1.06
GFR calc Af Amer: >60
GFR calc non Af Amer: >60
Glucose, Bld: 98
Glucose, Bld: 90
Potassium: 3.5
Sodium: 134 — ABNORMAL LOW

## 2011-06-23 LAB — POCT CARDIAC MARKERS
CKMB, poc: <1 — ABNORMAL LOW
Myoglobin, poc: 77.3
Troponin i, poc: <0.05

## 2011-06-23 LAB — CBC
HCT: 47.8
Hemoglobin: 17
MCHC: 34.3
MCV: 96.7
MCV: 98.8
RBC: 5.29
RDW: 13
RDW: 13.3

## 2011-06-23 LAB — RAPID URINE DRUG SCREEN, HOSP PERFORMED
Barbiturates: NOT DETECTED
Benzodiazepines: NOT DETECTED
Cocaine: NOT DETECTED

## 2011-06-25 LAB — BASIC METABOLIC PANEL
BUN: 8 mg/dL (ref 6–23)
CO2: 27 mEq/L (ref 19–32)
Calcium: 9.4 mg/dL (ref 8.4–10.5)
Glucose, Bld: 110 mg/dL — ABNORMAL HIGH (ref 70–99)
Potassium: 3.5 mEq/L (ref 3.5–5.1)
Sodium: 137 mEq/L (ref 135–145)

## 2011-06-25 LAB — POCT CARDIAC MARKERS
CKMB, poc: 1.8 ng/mL (ref 1.0–8.0)
Myoglobin, poc: 89.7 ng/mL (ref 12–200)
Myoglobin, poc: 98.8 ng/mL (ref 12–200)
Troponin i, poc: <0.05 ng/mL (ref 0.00–0.09)

## 2011-06-25 LAB — DIFFERENTIAL
Basophils Absolute: 0 10*3/uL (ref 0.0–0.1)
Basophils Relative: 0 % (ref 0–1)
Eosinophils Relative: 3 % (ref 0–5)
Monocytes Absolute: 0.5 10*3/uL (ref 0.1–1.0)

## 2011-06-25 LAB — CBC
HCT: 44.3 % (ref 39.0–52.0)
Hemoglobin: 15.6 g/dL (ref 13.0–17.0)
MCHC: 35.3 g/dL (ref 30.0–36.0)
RDW: 13.1 % (ref 11.5–15.5)

## 2011-07-17 ENCOUNTER — Emergency Department (HOSPITAL_COMMUNITY)
Admission: EM | Admit: 2011-07-17 | Discharge: 2011-07-17 | Disposition: A | Discharge status: Home / Self Care | Payer: BC Managed Care – PPO | Attending: Emergency Medicine | Admitting: Emergency Medicine

## 2011-07-17 ENCOUNTER — Encounter (HOSPITAL_COMMUNITY): Payer: Self-pay | Admitting: Emergency Medicine

## 2011-07-17 ENCOUNTER — Emergency Department (HOSPITAL_COMMUNITY): Payer: BC Managed Care – PPO

## 2011-07-17 DIAGNOSIS — J40 Bronchitis, not specified as acute or chronic: Secondary | ICD-10-CM | POA: Insufficient documentation

## 2011-07-17 DIAGNOSIS — I10 Essential (primary) hypertension: Secondary | ICD-10-CM | POA: Insufficient documentation

## 2011-07-17 DIAGNOSIS — F17200 Nicotine dependence, unspecified, uncomplicated: Secondary | ICD-10-CM | POA: Insufficient documentation

## 2011-07-17 MED ORDER — GUAIFENESIN-CODEINE 100-10 MG/5ML PO SYRP: ORAL_SOLUTION | ORAL | Status: DC

## 2011-07-17 MED ORDER — ALBUTEROL SULFATE HFA 108 (90 BASE) MCG/ACT IN AERS
2.0000 | INHALATION_SPRAY | Freq: Once | RESPIRATORY_TRACT | Status: AC
  Administered 2011-07-17: 2 via RESPIRATORY_TRACT
  Filled 2011-07-17: qty 6.7

## 2011-07-17 MED ORDER — ALBUTEROL SULFATE (5 MG/ML) 0.5% IN NEBU
2.5000 mg | INHALATION_SOLUTION | Freq: Once | RESPIRATORY_TRACT | Status: AC
  Administered 2011-07-17: 2.5 mg via RESPIRATORY_TRACT
  Filled 2011-07-17: qty 0.5

## 2011-07-17 MED ORDER — LIDOCAINE VISCOUS 2 % MT SOLN
20.0000 mL | Freq: Once | OROMUCOSAL | Status: AC
  Administered 2011-07-17: 20 mL via OROMUCOSAL
  Filled 2011-07-17: qty 15

## 2011-07-17 MED ORDER — PREDNISONE 20 MG PO TABS
60.0000 mg | ORAL_TABLET | Freq: Once | ORAL | Status: AC
  Administered 2011-07-17: 60 mg via ORAL
  Filled 2011-07-17: qty 3

## 2011-07-17 MED ORDER — PREDNISONE 50 MG PO TABS: ORAL_TABLET | ORAL | Status: AC

## 2011-07-17 NOTE — ED Notes (Signed)
Pt states he began having a productive cough and sore throat approximately 2 weeks ago.  Pt reports thick yellow mucus with cough.  Pt speaking in complete sentences.  NAD.

## 2011-07-17 NOTE — ED Provider Notes (Signed)
History     CSN: [id number] Arrival date & time: 07/17/2011  8:55 AM   First MD Initiated Contact with Patient 07/17/11 0912      Chief Complaint  Patient presents with  . Cough    (Consider location/radiation/quality/duration/timing/severity/associated sxs/prior treatment) Patient is a 30 y.o. male presenting with cough. The history is provided by the patient. No language interpreter was used.  Cough This is a new problem. Episode onset: 2 weeks ago. The problem occurs constantly. The problem has been gradually worsening. The cough is productive of sputum. Maximum temperature: subj fever. Associated symptoms include sore throat. Pertinent negatives include no shortness of breath. Associated symptoms comments: Sore throat. He has tried nothing for the symptoms. His past medical history does not include asthma.    Past Medical History  Diagnosis Date  . Hypertension     Past Surgical History  Procedure Date  . Appendectomy   . Tonsillectomy     History reviewed. No pertinent family history.  History  Substance Use Topics  . Smoking status: Current Everyday Smoker -- 0.5 packs/day  . Smokeless tobacco: Not on file  . Alcohol Use: 7.2 oz/week    12 Cans of beer per week      Review of Systems  HENT: Positive for sore throat and trouble swallowing.   Respiratory: Positive for cough. Negative for shortness of breath.   Gastrointestinal: Negative for nausea and vomiting.  All other systems reviewed and are negative.    Allergies  Review of patient's allergies indicates no known allergies.  Home Medications   Current Outpatient Rx  Name Route Sig Dispense Refill  . GUAIFENESIN 600 MG PO TB12 Oral Take 600 mg by mouth 2 (two) times daily. congestion     . PSEUDOEPH-DOXYLAMINE-DM-APAP 60-7.02-17-999 MG/30ML PO LIQD Oral Take 30 mLs by mouth daily as needed. For cough     . LOPERAMIDE HCL 2 MG PO TABS Oral Take 2 mg by mouth 3 (three) times daily as needed. For  symptoms       BP 127/90  Pulse 80  Temp(Src) 97.9 F (36.6 C) (Oral)  Resp 16  Ht 6\' 2"  (1.88 m)  Wt 245 lb (111.131 kg)  BMI 31.46 kg/m2  SpO2 97%  Physical Exam  Nursing note and vitals reviewed. Constitutional: He is oriented to person, place, and time. He appears well-developed and well-nourished. No distress.  HENT:  Head: Normocephalic and atraumatic.    Right Ear: External ear normal.  Left Ear: External ear normal.  Mouth/Throat: Mucous membranes are normal. Normal dentition. No dental abscesses. No oropharyngeal exudate, posterior oropharyngeal edema, posterior oropharyngeal erythema or tonsillar abscesses.       Uvula surgically removed.  Eyes: EOM are normal.  Neck: Normal range of motion.  Cardiovascular: Normal rate, regular rhythm, normal heart sounds and intact distal pulses.   Pulmonary/Chest: Effort normal. No accessory muscle usage. Not tachypneic. No respiratory distress. He has wheezes in the right upper field, the right middle field, the right lower field, the left upper field, the left middle field and the left lower field. He has no rales. He exhibits no tenderness.  Abdominal: Soft. He exhibits no distension. There is no tenderness.  Musculoskeletal: Normal range of motion.  Lymphadenopathy:    He has no cervical adenopathy.  Neurological: He is alert and oriented to person, place, and time. No cranial nerve deficit. Coordination normal.  Skin: Skin is warm and dry. He is not diaphoretic.  Psychiatric: He has a normal mood  and affect. Judgment normal.    ED Course  Procedures (including critical care time)   Labs Reviewed  RAPID STREP SCREEN   No results found.   No diagnosis found.    MDM          Worthy Rancher, PA 07/17/11 1240

## 2011-07-17 NOTE — ED Provider Notes (Signed)
Medical screening examination/treatment/procedure(s) were performed by non-physician practitioner and as supervising physician I was immediately available for consultation/collaboration.   Eldrick Penick M Kaedyn Belardo, DO 07/17/11 1738 

## 2011-07-17 NOTE — ED Notes (Signed)
Pt alert and oriented x 4.  Respirations even and unlabored.  NAD at this time.  Discharge instructions reviewed with patient and patient verbalized understanding.  Pt ambulated to POV with steady gait.

## 2011-09-11 ENCOUNTER — Emergency Department (HOSPITAL_COMMUNITY): Payer: BC Managed Care – PPO

## 2011-09-11 ENCOUNTER — Encounter (HOSPITAL_COMMUNITY): Payer: Self-pay | Admitting: Emergency Medicine

## 2011-09-11 ENCOUNTER — Emergency Department (HOSPITAL_COMMUNITY)
Admission: EM | Admit: 2011-09-11 | Discharge: 2011-09-11 | Disposition: A | Discharge status: Home / Self Care | Payer: BC Managed Care – PPO | Attending: Emergency Medicine | Admitting: Emergency Medicine

## 2011-09-11 DIAGNOSIS — M25469 Effusion, unspecified knee: Secondary | ICD-10-CM | POA: Insufficient documentation

## 2011-09-11 DIAGNOSIS — M25569 Pain in unspecified knee: Secondary | ICD-10-CM | POA: Insufficient documentation

## 2011-09-11 DIAGNOSIS — M25461 Effusion, right knee: Secondary | ICD-10-CM

## 2011-09-11 DIAGNOSIS — I10 Essential (primary) hypertension: Secondary | ICD-10-CM | POA: Insufficient documentation

## 2011-09-11 DIAGNOSIS — Z79899 Other long term (current) drug therapy: Secondary | ICD-10-CM | POA: Insufficient documentation

## 2011-09-11 DIAGNOSIS — F17200 Nicotine dependence, unspecified, uncomplicated: Secondary | ICD-10-CM | POA: Insufficient documentation

## 2011-09-11 MED ORDER — ACETAMINOPHEN-CODEINE #3 300-30 MG PO TABS: 1.0000 | ORAL_TABLET | Freq: Four times a day (QID) | ORAL | Status: AC | PRN

## 2011-09-11 MED ORDER — PREDNISONE 20 MG PO TABS
60.0000 mg | ORAL_TABLET | Freq: Once | ORAL | Status: AC
  Administered 2011-09-11: 60 mg via ORAL
  Filled 2011-09-11: qty 3

## 2011-09-11 MED ORDER — ACETAMINOPHEN-CODEINE #3 300-30 MG PO TABS
2.0000 | ORAL_TABLET | Freq: Once | ORAL | Status: AC
  Administered 2011-09-11: 2 via ORAL
  Filled 2011-09-11: qty 2

## 2011-09-11 MED ORDER — PREDNISONE 10 MG PO TABS: ORAL_TABLET | ORAL | Status: DC

## 2011-09-11 MED ORDER — PROMETHAZINE HCL 25 MG PO TABS: ORAL_TABLET | ORAL | Status: DC

## 2011-09-11 MED ORDER — PROMETHAZINE HCL 12.5 MG PO TABS
25.0000 mg | ORAL_TABLET | Freq: Once | ORAL | Status: AC
  Administered 2011-09-11: 25 mg via ORAL
  Filled 2011-09-11: qty 2

## 2011-09-11 NOTE — ED Notes (Signed)
Pt c/o rt knee pain x 2 weeks.

## 2011-09-11 NOTE — ED Provider Notes (Signed)
History     CSN: [id number]  Arrival date & time 09/11/11  1112   First MD Initiated Contact with Patient 09/11/11 1359      Chief Complaint  Patient presents with  . Knee Pain    (Consider location/radiation/quality/duration/timing/severity/associated sxs/prior treatment) HPI Comments: Patient reports history of chronic knee pain he reports multiple knee surgeries in the pass. He has been told that he may have to have a knee replacement however the surgeons are tempting to hold on this do to the patient's current age. The patient states that he is now having pain that he cannot control with conservative management. And he requests evaluation of the knee, swelling, and assistance with the pain.  Patient is a 30 y.o. male presenting with knee pain. The history is provided by the patient.  Knee Pain This is a chronic problem. The current episode started more than 1 year ago. The problem occurs daily. The problem has been gradually worsening. Associated symptoms include arthralgias. Pertinent negatives include no abdominal pain, chest pain, coughing, fever, neck pain, vomiting or weakness. The symptoms are aggravated by bending, walking and standing. He has tried NSAIDs for the symptoms. The treatment provided no relief.    Past Medical History  Diagnosis Date  . Hypertension     Past Surgical History  Procedure Date  . Appendectomy   . Tonsillectomy     History reviewed. No pertinent family history.  History  Substance Use Topics  . Smoking status: Current Everyday Smoker -- 0.5 packs/day  . Smokeless tobacco: Not on file  . Alcohol Use: 7.2 oz/week    12 Cans of beer per week      Review of Systems  Constitutional: Negative for fever and activity change.       All ROS Neg except as noted in HPI  HENT: Negative for nosebleeds and neck pain.   Eyes: Negative for photophobia and discharge.  Respiratory: Negative for cough, shortness of breath and wheezing.     Cardiovascular: Negative for chest pain and palpitations.  Gastrointestinal: Negative for vomiting, abdominal pain and blood in stool.  Genitourinary: Negative for dysuria, frequency and hematuria.  Musculoskeletal: Positive for arthralgias. Negative for back pain.  Skin: Negative.   Neurological: Negative for dizziness, seizures, speech difficulty and weakness.  Psychiatric/Behavioral: Negative for hallucinations and confusion.    Allergies  Vicodin  Home Medications   Current Outpatient Rx  Name Route Sig Dispense Refill  . ACETAMINOPHEN 500 MG PO TABS Oral Take 1,000 mg by mouth every 6 (six) hours as needed. For pain     . ALPRAZOLAM 1 MG PO TABS Oral Take 1 mg by mouth 3 (three) times daily as needed. For anxiety     . ASPIRIN 325 MG PO TABS Oral Take 650 mg by mouth every 6 (six) hours as needed. For pain     . IBUPROFEN 200 MG PO TABS Oral Take 400 mg by mouth every 6 (six) hours as needed. For pain     . ACETAMINOPHEN-CODEINE #3 300-30 MG PO TABS Oral Take 1-2 tablets by mouth every 6 (six) hours as needed for pain. 20 tablet 0  . PREDNISONE 10 MG PO TABS  6,5,4,3,2,1 - Take with food 21 tablet 0  . PROMETHAZINE HCL 25 MG PO TABS  Take one with tylenol-codeine every 6 hours as needed 20 tablet 0    BP 149/88  Pulse 101  Temp(Src) 97.6 F (36.4 C) (Oral)  Resp 18  Ht 6\' 2"  (1.88  m)  Wt 237 lb (107.502 kg)  BMI 30.43 kg/m2  SpO2 97%  Physical Exam  Nursing note and vitals reviewed. Constitutional: He is oriented to person, place, and time. He appears well-developed and well-nourished.  Non-toxic appearance.  HENT:  Head: Normocephalic.  Right Ear: Tympanic membrane and external ear normal.  Left Ear: Tympanic membrane and external ear normal.  Eyes: EOM and lids are normal. Pupils are equal, round, and reactive to light.  Neck: Normal range of motion. Neck supple. Carotid bruit is not present.  Cardiovascular: Normal rate, regular rhythm, normal heart sounds,  intact distal pulses and normal pulses.   Pulmonary/Chest: Breath sounds normal. No respiratory distress.  Abdominal: Soft. Bowel sounds are normal. There is no tenderness. There is no guarding.  Musculoskeletal: Normal range of motion.       The dorsalis pedis pulse is symmetrical. The right Achilles tendon is intact. There is effusion of the right knee. The right knee is warm to touch but not hot. There is no posterior mass. No quad deformity. There is significant pain to palpation and attempted range of motion of the right knee.  Lymphadenopathy:       Head (right side): No submandibular adenopathy present.       Head (left side): No submandibular adenopathy present.    He has no cervical adenopathy.  Neurological: He is alert and oriented to person, place, and time. He has normal strength. No cranial nerve deficit or sensory deficit.  Skin: Skin is warm and dry.  Psychiatric: He has a normal mood and affect. His speech is normal.    ED Course  Procedures (including critical care time)  Labs Reviewed - No data to display Dg Knee Complete 4 Views Right  09/11/2011  *RADIOLOGY REPORT*  Clinical Data: Knee pain, swelling.  RIGHT KNEE - COMPLETE 4+ VIEW  Comparison: 02/13/2010  Findings: There is a moderate joint effusion.  Joint spaces are maintained. No acute bony abnormality.  Specifically, no fracture, subluxation, or dislocation.  Soft tissues are intact.  IMPRESSION: Moderate joint effusion. No acute bony abnormality.  Original Report Authenticated By: Cyndie Chime, M.D.   pulse oximetry 97% on room air. Within normal limits by my interpretation.   1. Knee effusion, right       MDM  I have reviewed nursing notes, vital signs, and all appropriate lab and imaging results for this patient. Patient strongly advised to see his primary physician for assistance with pain management. Patient strongly advised to see his orthopedic specialist for reevaluation of his right knee. Patient  fitted with a knee immobilizer and crutches.       Kathie Dike, [place] 09/11/11 [phone]

## 2011-09-11 NOTE — ED Notes (Signed)
Pt a/ox4. Resp even and unlabored. NAD at this time. D/C instructions and Rx x3 reviewed with pt. Pt verbalized understanding. Pt ambulated to lobby with steady gate.   

## 2011-09-11 NOTE — ED Notes (Signed)
Knee immobilizer applied to right knee and pt fitted for crutches.

## 2011-09-11 NOTE — ED Notes (Signed)
Pt presents with right knee pain and swelling. Pt states pain started 2 weeks ago and swelling occurred 2 days ago. Right knee is greatly larger than left. Pt unable to put weight on right leg. Pt denies injury. Pt states 2 years ago he has a nail go through his right knee and ever since he has had problems with his knee. Pt states he has tried OTC medication for pain but with no relief. NAD at this time.

## 2011-09-12 NOTE — ED Provider Notes (Signed)
Medical screening examination/treatment/procedure(s) were performed by non-physician practitioner and as supervising physician I was immediately available for consultation/collaboration.    Aroldo Galli L Veora Fonte, MD 09/12/11 2226 

## 2011-10-02 ENCOUNTER — Other Ambulatory Visit: Payer: Self-pay | Admitting: Pain Medicine

## 2011-10-06 ENCOUNTER — Other Ambulatory Visit (HOSPITAL_COMMUNITY): Payer: Self-pay | Admitting: Specialist

## 2011-10-06 DIAGNOSIS — M25569 Pain in unspecified knee: Secondary | ICD-10-CM

## 2011-10-06 DIAGNOSIS — T148XXA Other injury of unspecified body region, initial encounter: Secondary | ICD-10-CM

## 2011-10-07 ENCOUNTER — Ambulatory Visit (HOSPITAL_COMMUNITY): Payer: Self-pay

## 2011-10-07 ENCOUNTER — Ambulatory Visit (HOSPITAL_COMMUNITY): Payer: Self-pay | Attending: Specialist

## 2011-10-07 ENCOUNTER — Other Ambulatory Visit (HOSPITAL_COMMUNITY): Payer: BC Managed Care – PPO

## 2012-04-15 DIAGNOSIS — R112 Nausea with vomiting, unspecified: Secondary | ICD-10-CM

## 2012-08-07 ENCOUNTER — Encounter (HOSPITAL_COMMUNITY): Payer: Self-pay

## 2012-08-07 ENCOUNTER — Emergency Department (HOSPITAL_COMMUNITY): Payer: Self-pay

## 2012-08-07 ENCOUNTER — Emergency Department (HOSPITAL_COMMUNITY)
Admission: EM | Admit: 2012-08-07 | Discharge: 2012-08-07 | Disposition: A | Discharge status: Home / Self Care | Payer: Self-pay | Attending: Emergency Medicine | Admitting: Emergency Medicine

## 2012-08-07 DIAGNOSIS — S41009A Unspecified open wound of unspecified shoulder, initial encounter: Secondary | ICD-10-CM | POA: Insufficient documentation

## 2012-08-07 DIAGNOSIS — F17200 Nicotine dependence, unspecified, uncomplicated: Secondary | ICD-10-CM | POA: Insufficient documentation

## 2012-08-07 DIAGNOSIS — I10 Essential (primary) hypertension: Secondary | ICD-10-CM | POA: Insufficient documentation

## 2012-08-07 DIAGNOSIS — T148XXA Other injury of unspecified body region, initial encounter: Secondary | ICD-10-CM

## 2012-08-07 DIAGNOSIS — Z79899 Other long term (current) drug therapy: Secondary | ICD-10-CM | POA: Insufficient documentation

## 2012-08-07 MED ORDER — TETANUS-DIPHTH-ACELL PERTUSSIS 5-2.5-18.5 LF-MCG/0.5 IM SUSP
0.5000 mL | Freq: Once | INTRAMUSCULAR | Status: AC
  Administered 2012-08-07: 0.5 mL via INTRAMUSCULAR
  Filled 2012-08-07: qty 0.5

## 2012-08-07 MED ORDER — LIDOCAINE-EPINEPHRINE (PF) 1 %-1:200000 IJ SOLN
INTRAMUSCULAR | Status: AC
  Administered 2012-08-07: 03:00:00
  Filled 2012-08-07: qty 10
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
## 2012-08-15 ENCOUNTER — Emergency Department (HOSPITAL_COMMUNITY)
Admission: EM | Admit: 2012-08-15 | Discharge: 2012-08-15 | Disposition: A | Discharge status: Home / Self Care | Payer: Self-pay | Attending: Emergency Medicine | Admitting: Emergency Medicine

## 2012-08-15 ENCOUNTER — Encounter (HOSPITAL_COMMUNITY): Payer: Self-pay | Admitting: Emergency Medicine

## 2012-08-15 DIAGNOSIS — Z79899 Other long term (current) drug therapy: Secondary | ICD-10-CM | POA: Insufficient documentation

## 2012-08-15 DIAGNOSIS — M25569 Pain in unspecified knee: Secondary | ICD-10-CM | POA: Insufficient documentation

## 2012-08-15 DIAGNOSIS — M25561 Pain in right knee: Secondary | ICD-10-CM

## 2012-08-15 DIAGNOSIS — G8929 Other chronic pain: Secondary | ICD-10-CM | POA: Insufficient documentation

## 2012-08-15 DIAGNOSIS — F17200 Nicotine dependence, unspecified, uncomplicated: Secondary | ICD-10-CM | POA: Insufficient documentation

## 2012-08-15 DIAGNOSIS — Z4802 Encounter for removal of sutures: Secondary | ICD-10-CM | POA: Insufficient documentation

## 2012-08-15 DIAGNOSIS — I10 Essential (primary) hypertension: Secondary | ICD-10-CM | POA: Insufficient documentation

## 2012-08-15 MED ORDER — NAPROXEN 500 MG PO TABS: 500.0000 mg | ORAL_TABLET | Freq: Two times a day (BID) | ORAL | Status: DC

## 2012-08-15 MED ORDER — OXYCODONE-ACETAMINOPHEN 5-325 MG PO TABS: 1.0000 | ORAL_TABLET | ORAL | Status: AC | PRN

## 2012-08-15 NOTE — ED Notes (Signed)
Staples removed from healed lac to upper lt back.

## 2012-08-15 NOTE — ED Provider Notes (Signed)
History     CSN: [id number]  Arrival date & time 08/15/12  7829   First MD Initiated Contact with Patient 08/15/12 1912      Chief Complaint  Patient presents with  . Suture / Staple Removal  . Knee Pain    (Consider location/radiation/quality/duration/timing/severity/associated sxs/prior treatment) HPI Comments: Patient comes to ED requesting staple removal.  He was seen here 8 days ago and treated for a laceration to his upper back. He denies problems with the wound, redness, drainage or excessive warmth.  Patient also c/o increasing pain and swelling or the right knee.  He has hx of same, states that he has been working outside and his job requires a lot of walking and standing which he states makes his knee pain worse.  He denies numbness to his leg, redness, excessive warmth or fever  Patient is a 31 y.o. male presenting with suture removal and knee pain. The history is provided by the patient.  Suture / Staple Removal  The sutures were placed 7 to 10 days ago. There has been no treatment since the wound repair. Fever duration: no fever. There has been no drainage from the wound. There is no redness present. There is no swelling present. The pain has improved. He has no difficulty moving the affected extremity or digit.  Knee Pain This is a recurrent problem. The current episode started in the past 7 days. The problem occurs constantly. The problem has been gradually worsening. Associated symptoms include arthralgias and joint swelling. Pertinent negatives include no chills, fever, headaches, nausea, neck pain, numbness, sore throat, swollen glands, visual change, vomiting or weakness. The symptoms are aggravated by standing, walking, bending and twisting. He has tried nothing for the symptoms. The treatment provided no relief.    Past Medical History  Diagnosis Date  . Hypertension     Past Surgical History  Procedure Date  . Appendectomy   . Tonsillectomy     History  reviewed. No pertinent family history.  History  Substance Use Topics  . Smoking status: Current Every Day Smoker -- 0.5 packs/day  . Smokeless tobacco: Not on file  . Alcohol Use: 0.0 oz/week     Comment: rare      Review of Systems  Constitutional: Negative for fever and chills.  HENT: Negative for sore throat and neck pain.   Gastrointestinal: Negative for nausea and vomiting.  Genitourinary: Negative for dysuria and difficulty urinating.  Musculoskeletal: Positive for joint swelling and arthralgias. Negative for gait problem.  Skin: Negative for color change and wound.       Laceration upper back with staples in place  Neurological: Negative for weakness, numbness and headaches.  All other systems reviewed and are negative.    Allergies  Vicodin  Home Medications   Current Outpatient Rx  Name  Route  Sig  Dispense  Refill  . ACETAMINOPHEN 500 MG PO TABS   Oral   Take 1,000 mg by mouth every 6 (six) hours as needed. For pain          . ALPRAZOLAM 1 MG PO TABS   Oral   Take 1 mg by mouth 3 (three) times daily as needed. For anxiety          . ASPIRIN 325 MG PO TABS   Oral   Take 650 mg by mouth every 6 (six) hours as needed. For pain          . IBUPROFEN 200 MG PO TABS   Oral  Take 400 mg by mouth every 6 (six) hours as needed. For pain          . PREDNISONE 10 MG PO TABS      6,5,4,3,2,1 - Take with food   21 tablet   0   . PROMETHAZINE HCL 25 MG PO TABS      Take one with tylenol-codeine every 6 hours as needed   20 tablet   0     BP 148/105  Pulse 91  Temp 97.5 F (36.4 C)  Resp 18  Ht 6\' 2"  (1.88 m)  Wt 225 lb (102.059 kg)  BMI 28.89 kg/m2  SpO2 98%  Physical Exam  Nursing note and vitals reviewed. Constitutional: He is oriented to person, place, and time. He appears well-developed and well-nourished. No distress.  Cardiovascular: Normal rate, regular rhythm, normal heart sounds and intact distal pulses.   Pulmonary/Chest:  Effort normal and breath sounds normal.  Musculoskeletal: He exhibits tenderness.       Right knee: He exhibits normal range of motion, no swelling, no effusion, no ecchymosis, no deformity, no erythema and no bony tenderness. tenderness found. Medial joint line and lateral joint line tenderness noted. No patellar tendon tenderness noted.       Legs:      ttp of the anterior right knee.  Mild to moderate crepitus. No erythema, bruising , effusion or deformity.  No calf pain or erythema.  Dp pulse brisk, Distal sensation intact.  Pt has full ROM of the joint  Neurological: He is alert and oriented to person, place, and time. He exhibits normal muscle tone. Coordination normal.  Skin: Skin is warm and dry. No erythema.       Laceration to left upper back appears to be healing well.  Staples in place.  No wound dehiscence, erythema or drainage.      ED Course  Procedures (including critical care time)  Labs Reviewed - No data to display No results found.   Previous ED chart and imaging of the right knee reviewed by me.     MDM    staples removed by nursing staff w/o difficulty.  Suture line intact.    Patient ambulates with a steady gait.  Has hx of chronic knee pain.  Advised to f/u with his orthopedic regarding the knee pain.    Prescribed: Naprosyn Percocet #8   Maryah Marinaro L. [place], [place] 08/16/12 8295

## 2012-08-15 NOTE — ED Notes (Signed)
Pt here for staple removal from back. Pt also c/o right knee pain/swelling.

## 2012-08-18 NOTE — ED Provider Notes (Signed)
Medical screening examination/treatment/procedure(s) were performed by non-physician practitioner and as supervising physician I was immediately available for consultation/collaboration.  Hurman Horn, MD 08/18/12 1900

## 2012-12-18 ENCOUNTER — Emergency Department (HOSPITAL_COMMUNITY)
Admission: EM | Admit: 2012-12-18 | Discharge: 2012-12-18 | Disposition: A | Discharge status: Home / Self Care | Payer: Self-pay | Attending: Emergency Medicine | Admitting: Emergency Medicine

## 2012-12-18 ENCOUNTER — Encounter (HOSPITAL_COMMUNITY): Payer: Self-pay | Admitting: Emergency Medicine

## 2012-12-18 DIAGNOSIS — R6883 Chills (without fever): Secondary | ICD-10-CM | POA: Insufficient documentation

## 2012-12-18 DIAGNOSIS — F17200 Nicotine dependence, unspecified, uncomplicated: Secondary | ICD-10-CM | POA: Insufficient documentation

## 2012-12-18 DIAGNOSIS — I10 Essential (primary) hypertension: Secondary | ICD-10-CM | POA: Insufficient documentation

## 2012-12-18 DIAGNOSIS — R112 Nausea with vomiting, unspecified: Secondary | ICD-10-CM | POA: Insufficient documentation

## 2012-12-18 LAB — BASIC METABOLIC PANEL
BUN: 12 mg/dL (ref 6–23)
CO2: 27 mEq/L (ref 19–32)
Calcium: 9.5 mg/dL (ref 8.4–10.5)
Chloride: 100 mEq/L (ref 96–112)
Creatinine, Ser: 1 mg/dL (ref 0.50–1.35)
GFR calc Af Amer: >90 mL/min (ref >90)
GFR calc non Af Amer: >90 mL/min (ref >90)
Glucose, Bld: 128 mg/dL — ABNORMAL HIGH (ref 70–99)
Potassium: 3.9 mEq/L (ref 3.5–5.1)
Sodium: 138 mEq/L (ref 135–145)

## 2012-12-18 LAB — CBC WITH DIFFERENTIAL/PLATELET
Basophils Absolute: 0 10*3/uL (ref 0.0–0.1)
Basophils Relative: 0 % (ref 0–1)
Eosinophils Absolute: 0.2 10*3/uL (ref 0.0–0.7)
Eosinophils Relative: 1 % (ref 0–5)
HCT: 50.3 % (ref 39.0–52.0)
Hemoglobin: 18.7 g/dL — ABNORMAL HIGH (ref 13.0–17.0)
Lymphocytes Relative: 22 % (ref 12–46)
Lymphs Abs: 2.9 10*3/uL (ref 0.7–4.0)
MCH: 35.6 pg — ABNORMAL HIGH (ref 26.0–34.0)
MCHC: 37.2 g/dL — ABNORMAL HIGH (ref 30.0–36.0)
MCV: 95.6 fL (ref 78.0–100.0)
Monocytes Absolute: 0.8 10*3/uL (ref 0.1–1.0)
Monocytes Relative: 6 % (ref 3–12)
Neutro Abs: 9.3 10*3/uL — ABNORMAL HIGH (ref 1.7–7.7)
Neutrophils Relative %: 70 % (ref 43–77)
Platelets: 190 10*3/uL (ref 150–400)
RBC: 5.26 MIL/uL (ref 4.22–5.81)
RDW: 12.7 % (ref 11.5–15.5)
WBC: 13.2 10*3/uL — ABNORMAL HIGH (ref 4.0–10.5)

## 2012-12-18 MED ORDER — METOCLOPRAMIDE HCL 5 MG/ML IJ SOLN
INTRAMUSCULAR | Status: AC
  Filled 2012-12-18: qty 2

## 2012-12-18 MED ORDER — HYDROMORPHONE HCL PF 1 MG/ML IJ SOLN
1.0000 mg | Freq: Once | INTRAMUSCULAR | Status: AC
  Administered 2012-12-18: 1 mg via INTRAVENOUS
  Filled 2012-12-18: qty 1

## 2012-12-18 MED ORDER — SODIUM CHLORIDE 0.9 % IV BOLUS (SEPSIS)
1000.0000 mL | Freq: Once | INTRAVENOUS | Status: AC
  Administered 2012-12-18: 1000 mL via INTRAVENOUS

## 2012-12-18 MED ORDER — METOCLOPRAMIDE HCL 10 MG PO TABS: 10.0000 mg | ORAL_TABLET | Freq: Four times a day (QID) | ORAL | Status: DC | PRN

## 2012-12-18 MED ORDER — METOCLOPRAMIDE HCL 5 MG/ML IJ SOLN
10.0000 mg | Freq: Once | INTRAMUSCULAR | Status: AC
  Administered 2012-12-18: 10 mg via INTRAVENOUS

## 2012-12-18 MED ORDER — ONDANSETRON HCL 4 MG/2ML IJ SOLN
4.0000 mg | Freq: Once | INTRAMUSCULAR | Status: AC
  Administered 2012-12-18: 4 mg via INTRAVENOUS
  Filled 2012-12-18: qty 2

## 2012-12-18 NOTE — ED Notes (Signed)
Has been sleeping soundly.

## 2012-12-18 NOTE — ED Notes (Signed)
Feels much better, sipping coke

## 2012-12-18 NOTE — ED Notes (Signed)
Pt vomiting again, orders received for reglan

## 2012-12-18 NOTE — ED Notes (Signed)
N/v since last night. Pt vomiting in triage. Denies diarrhea. C/o pain to chest "from puking so much".

## 2012-12-18 NOTE — ED Provider Notes (Signed)
History     This chart was scribed for Jermaine Razor, MD, MD by Smitty Pluck, ED Scribe. The patient was seen in room APA04/APA04 and the patient's care was started at 5:37 PM.   CSN: [id number]  Arrival date & time 12/18/12  1720      Chief Complaint  Patient presents with  . Emesis     The history is provided by the patient. No language interpreter was used.   Jermaine [name] is a 32 y.o. male with h/o HTN who presents to the Emergency Department complaining of constant, nausea and moderate emesis onset today 12 hours ago. He has vomited multiple times since onset. He has taken Zofran without relief. Pt reports that he has chills and burning sensation in his chest due to vomiting so much. Wife mentions that the vomit is yellow and appears to have streaks of blood. Pt denies fever, diarrhea, weakness, cough, SOB and any other pain. He denies sick contact.  He hx of appendectomy. He drinks alcohol socially.   Past Medical History  Diagnosis Date  . Hypertension     Past Surgical History  Procedure Laterality Date  . Appendectomy    . Tonsillectomy      History reviewed. No pertinent family history.  History  Substance Use Topics  . Smoking status: Current Every Day Smoker -- 0.50 packs/day  . Smokeless tobacco: Not on file  . Alcohol Use: 0.0 oz/week     Comment: rare      Review of Systems  All other systems reviewed and are negative.    Allergies  Vicodin  Home Medications  No current outpatient prescriptions on file.  BP 128/106  Pulse 81  Temp(Src) 98 F (36.7 C) (Oral)  Resp 24  Ht 6\' 2"  (1.88 m)  Wt 235 lb (106.595 kg)  BMI 30.16 kg/m2  SpO2 99%  Physical Exam  Nursing note and vitals reviewed. Constitutional: He appears well-developed and well-nourished. No distress.  Uncomfortable appearing but  not toxic  HENT:  Head: Normocephalic and atraumatic.  Eyes: Conjunctivae are normal. Right eye exhibits no discharge. Left eye exhibits no  discharge.  Neck: Neck supple.  Cardiovascular: Normal rate, regular rhythm and normal heart sounds.  Exam reveals no gallop and no friction rub.   No murmur heard. Pulmonary/Chest: Effort normal. No respiratory distress. He has wheezes (bilaterally).  Abdominal: Soft. He exhibits no distension. There is tenderness (diffuse). There is no rebound and no guarding.  Musculoskeletal: He exhibits no edema and no tenderness.  Neurological: He is alert.  Skin: Skin is warm and dry.  Psychiatric: He has a normal mood and affect. His behavior is normal. Thought content normal.    ED Course  Procedures (including critical care time) DIAGNOSTIC STUDIES: Oxygen Saturation is 99% on room air, normal by my interpretation.    COORDINATION OF CARE: 5:41 PM Discussed ED treatment with pt and pt agrees.  5:41 PM Ordered:  Medications  ondansetron (ZOFRAN) injection 4 mg (4 mg Intravenous Given 12/18/12 1741)  sodium chloride 0.9 % bolus 1,000 mL (1,000 mLs Intravenous New Bag/Given 12/18/12 1741)  metoCLOPramide (REGLAN) injection 10 mg (10 mg Intravenous Given 12/18/12 1812)   6:45 PM Recheck: pt reports that he feels the same. Will order more medication.     Labs Reviewed  CBC WITH DIFFERENTIAL - Abnormal; Notable for the following:    WBC 13.2 (*)    Hemoglobin 18.7 (*)    MCH 35.6 (*)    MCHC 37.2 (*)  Neutro Abs 9.3 (*)    All other components within normal limits  BASIC METABOLIC PANEL - Abnormal; Notable for the following:    Glucose, Bld 128 (*)    All other components within normal limits   No results found.   1. Nausea and vomiting       MDM  Likely viral illness. Very low suspicion for emergent surgical process, significant metabolic derangement or serious bacterial illness. Tx'd symptomatically with much improved symptoms. HD stable. Return precautions discussed.     I personally preformed the services scribed in my presence. The recorded information has been reviewed  is accurate. Jermaine Razor, MD.    Jermaine Razor, MD 12/21/12 [phone]

## 2013-02-11 ENCOUNTER — Emergency Department (HOSPITAL_COMMUNITY)
Admission: EM | Admit: 2013-02-11 | Discharge: 2013-02-11 | Disposition: A | Discharge status: Home / Self Care | Payer: Self-pay | Attending: Emergency Medicine | Admitting: Emergency Medicine

## 2013-02-11 ENCOUNTER — Emergency Department (HOSPITAL_COMMUNITY): Payer: Self-pay

## 2013-02-11 ENCOUNTER — Encounter (HOSPITAL_COMMUNITY): Payer: Self-pay | Admitting: *Deleted

## 2013-02-11 DIAGNOSIS — Y9389 Activity, other specified: Secondary | ICD-10-CM | POA: Insufficient documentation

## 2013-02-11 DIAGNOSIS — F17200 Nicotine dependence, unspecified, uncomplicated: Secondary | ICD-10-CM | POA: Insufficient documentation

## 2013-02-11 DIAGNOSIS — IMO0002 Reserved for concepts with insufficient information to code with codable children: Secondary | ICD-10-CM | POA: Insufficient documentation

## 2013-02-11 DIAGNOSIS — L089 Local infection of the skin and subcutaneous tissue, unspecified: Secondary | ICD-10-CM

## 2013-02-11 DIAGNOSIS — Y929 Unspecified place or not applicable: Secondary | ICD-10-CM | POA: Insufficient documentation

## 2013-02-11 DIAGNOSIS — S60221A Contusion of right hand, initial encounter: Secondary | ICD-10-CM

## 2013-02-11 DIAGNOSIS — S60229A Contusion of unspecified hand, initial encounter: Secondary | ICD-10-CM | POA: Insufficient documentation

## 2013-02-11 DIAGNOSIS — I10 Essential (primary) hypertension: Secondary | ICD-10-CM | POA: Insufficient documentation

## 2013-02-11 DIAGNOSIS — W230XXA Caught, crushed, jammed, or pinched between moving objects, initial encounter: Secondary | ICD-10-CM | POA: Insufficient documentation

## 2013-02-11 MED ORDER — KETOROLAC TROMETHAMINE 10 MG PO TABS
10.0000 mg | ORAL_TABLET | Freq: Once | ORAL | Status: AC
  Administered 2013-02-11: 10 mg via ORAL
  Filled 2013-02-11: qty 1

## 2013-02-11 MED ORDER — SULFAMETHOXAZOLE-TRIMETHOPRIM 800-160 MG PO TABS: 1.0000 | ORAL_TABLET | Freq: Two times a day (BID) | ORAL | Status: DC

## 2013-02-11 MED ORDER — ONDANSETRON HCL 4 MG PO TABS
4.0000 mg | ORAL_TABLET | Freq: Once | ORAL | Status: AC
  Administered 2013-02-11: 4 mg via ORAL
  Filled 2013-02-11: qty 1

## 2013-02-11 MED ORDER — CEFTRIAXONE SODIUM 1 G IJ SOLR
1.0000 g | Freq: Once | INTRAMUSCULAR | Status: AC
  Administered 2013-02-11: 1 g via INTRAMUSCULAR
  Filled 2013-02-11: qty 10

## 2013-02-11 MED ORDER — DICLOFENAC SODIUM 75 MG PO TBEC: 75.0000 mg | DELAYED_RELEASE_TABLET | Freq: Two times a day (BID) | ORAL | Status: DC

## 2013-02-11 MED ORDER — AMOXICILLIN 500 MG PO CAPS: 500.0000 mg | ORAL_CAPSULE | Freq: Three times a day (TID) | ORAL | Status: DC

## 2013-02-11 MED ORDER — OXYCODONE-ACETAMINOPHEN 5-325 MG PO TABS: ORAL_TABLET | ORAL | Status: DC

## 2013-02-11 NOTE — ED Notes (Signed)
Pt has had no adverse reaction to medications.
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
## 2013-02-11 NOTE — ED Notes (Signed)
Pt alert & oriented x4, stable gait. Patient given discharge instructions, paperwork & prescription(s). Patient  instructed to stop at the registration desk to finish any additional paperwork. Patient verbalized understanding. Pt left department w/ no further questions. 
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
## 2013-02-12 NOTE — ED Provider Notes (Signed)
Medical screening examination/treatment/procedure(s) were performed by non-physician practitioner and as supervising physician I was immediately available for consultation/collaboration. Devoria Albe, MD, FACEP   Ward Givens, MD 02/12/13 [phone]

## 2013-02-16 ENCOUNTER — Emergency Department (HOSPITAL_COMMUNITY)
Admission: EM | Admit: 2013-02-16 | Discharge: 2013-02-17 | Disposition: A | Discharge status: Home / Self Care | Payer: Self-pay | Attending: Emergency Medicine | Admitting: Emergency Medicine

## 2013-02-16 ENCOUNTER — Emergency Department (HOSPITAL_COMMUNITY): Payer: Self-pay

## 2013-02-16 ENCOUNTER — Encounter (HOSPITAL_COMMUNITY): Payer: Self-pay | Admitting: *Deleted

## 2013-02-16 DIAGNOSIS — Y939 Activity, unspecified: Secondary | ICD-10-CM | POA: Insufficient documentation

## 2013-02-16 DIAGNOSIS — F17200 Nicotine dependence, unspecified, uncomplicated: Secondary | ICD-10-CM | POA: Insufficient documentation

## 2013-02-16 DIAGNOSIS — S9780XA Crushing injury of unspecified foot, initial encounter: Secondary | ICD-10-CM | POA: Insufficient documentation

## 2013-02-16 DIAGNOSIS — I10 Essential (primary) hypertension: Secondary | ICD-10-CM | POA: Insufficient documentation

## 2013-02-16 DIAGNOSIS — Y92410 Unspecified street and highway as the place of occurrence of the external cause: Secondary | ICD-10-CM | POA: Insufficient documentation

## 2013-02-16 DIAGNOSIS — S9781XA Crushing injury of right foot, initial encounter: Secondary | ICD-10-CM

## 2013-02-16 DIAGNOSIS — I2510 Atherosclerotic heart disease of native coronary artery without angina pectoris: Secondary | ICD-10-CM | POA: Insufficient documentation

## 2013-02-16 HISTORY — DX: Atherosclerotic heart disease of native coronary artery without angina pectoris: I25.10

## 2013-02-16 MED ORDER — IBUPROFEN 600 MG PO TABS: 600.0000 mg | ORAL_TABLET | Freq: Four times a day (QID) | ORAL | Status: DC | PRN

## 2013-02-16 MED ORDER — OXYCODONE-ACETAMINOPHEN 5-325 MG PO TABS: 2.0000 | ORAL_TABLET | ORAL | Status: DC | PRN
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
## 2013-02-17 MED ORDER — OXYCODONE-ACETAMINOPHEN 5-325 MG PO TABS
ORAL_TABLET | ORAL | Status: AC
  Administered 2013-02-17: 1
  Filled 2013-02-17: qty 1

## 2013-05-08 ENCOUNTER — Encounter (HOSPITAL_COMMUNITY): Payer: Self-pay | Admitting: Emergency Medicine

## 2013-05-08 ENCOUNTER — Emergency Department (HOSPITAL_COMMUNITY)
Admission: EM | Admit: 2013-05-08 | Discharge: 2013-05-08 | Disposition: A | Discharge status: Home / Self Care | Payer: Self-pay | Attending: Emergency Medicine | Admitting: Emergency Medicine

## 2013-05-08 ENCOUNTER — Emergency Department (HOSPITAL_COMMUNITY): Payer: Self-pay

## 2013-05-08 DIAGNOSIS — R05 Cough: Secondary | ICD-10-CM | POA: Insufficient documentation

## 2013-05-08 DIAGNOSIS — R51 Headache: Secondary | ICD-10-CM | POA: Insufficient documentation

## 2013-05-08 DIAGNOSIS — I10 Essential (primary) hypertension: Secondary | ICD-10-CM | POA: Insufficient documentation

## 2013-05-08 DIAGNOSIS — I2510 Atherosclerotic heart disease of native coronary artery without angina pectoris: Secondary | ICD-10-CM | POA: Insufficient documentation

## 2013-05-08 DIAGNOSIS — R079 Chest pain, unspecified: Secondary | ICD-10-CM | POA: Insufficient documentation

## 2013-05-08 DIAGNOSIS — F17200 Nicotine dependence, unspecified, uncomplicated: Secondary | ICD-10-CM | POA: Insufficient documentation

## 2013-05-08 DIAGNOSIS — R059 Cough, unspecified: Secondary | ICD-10-CM | POA: Insufficient documentation

## 2013-05-08 DIAGNOSIS — Z79899 Other long term (current) drug therapy: Secondary | ICD-10-CM | POA: Insufficient documentation

## 2013-05-08 DIAGNOSIS — Z8774 Personal history of (corrected) congenital malformations of heart and circulatory system: Secondary | ICD-10-CM | POA: Insufficient documentation

## 2013-05-08 HISTORY — DX: Congenital malformation of heart, unspecified: Q24.9

## 2013-05-08 LAB — POCT I-STAT, CHEM 8
BUN: 8 mg/dL (ref 6–23)
Hemoglobin: 14.6 g/dL (ref 13.0–17.0)
Potassium: 3.4 mEq/L — ABNORMAL LOW (ref 3.5–5.1)
Sodium: 141 mEq/L (ref 135–145)
TCO2: 23 mmol/L (ref 0–100)

## 2013-05-08 LAB — CBC WITH DIFFERENTIAL/PLATELET
Basophils Absolute: 0 10*3/uL (ref 0.0–0.1)
Eosinophils Absolute: 0.3 10*3/uL (ref 0.0–0.7)
Eosinophils Relative: 4 % (ref 0–5)
MCH: 34.6 pg — ABNORMAL HIGH (ref 26.0–34.0)
MCV: 95.4 fL (ref 78.0–100.0)
Monocytes Absolute: 0.6 10*3/uL (ref 0.1–1.0)
Platelets: 157 10*3/uL (ref 150–400)
RDW: 12.2 % (ref 11.5–15.5)

## 2013-05-08 LAB — POCT I-STAT TROPONIN I: Troponin i, poc: 0 ng/mL (ref 0.00–0.08)

## 2013-05-08 LAB — BASIC METABOLIC PANEL
Creatinine, Ser: 0.97 mg/dL (ref 0.50–1.35)
GFR calc Af Amer: >90 mL/min (ref >90)
Glucose, Bld: 103 mg/dL — ABNORMAL HIGH (ref 70–99)
Sodium: 137 mEq/L (ref 135–145)

## 2013-05-08 MED ORDER — ACETAMINOPHEN 325 MG PO TABS
650.0000 mg | ORAL_TABLET | Freq: Once | ORAL | Status: AC
  Administered 2013-05-08: 650 mg via ORAL

## 2013-05-08 MED ORDER — ACETAMINOPHEN 325 MG PO TABS
ORAL_TABLET | ORAL | Status: AC
  Filled 2013-05-08: qty 2

## 2013-05-08 MED ORDER — NITROGLYCERIN 0.4 MG SL SUBL
0.4000 mg | SUBLINGUAL_TABLET | SUBLINGUAL | Status: DC | PRN
  Administered 2013-05-08: 0.4 mg via SUBLINGUAL
  Filled 2013-05-08: qty 25

## 2013-05-08 NOTE — ED Notes (Signed)
Patient c/o left sided chest pain x 2 hours; states he coughs all the time.  Patient states pain is sharp in nature.  Patient also c/o headache.

## 2013-05-08 NOTE — ED Notes (Addendum)
Pt reports onset of chest pains that started a few hours ago. Pt states he took 2 325 ASA before coming to the ER. Pt denies any activity prior to chest pain starting. Pt states he also has a headache that started when chest pain started.

## 2013-05-08 NOTE — ED Notes (Signed)
Pt alert & oriented x4, stable gait. Patient given discharge instructions, paperwork & prescription(s). Patient  instructed to stop at the registration desk to finish any additional paperwork. Patient verbalized understanding. Pt left department w/ no further questions. 

## 2013-05-08 NOTE — ED Provider Notes (Addendum)
CSN: [id number]     Arrival date & time 05/08/13  2006 History  This chart was scribed for Hilario Quarry, MD by Bennett Scrape, ED Scribe. This patient was seen in room APA02/APA02 and the patient's care was started at 8:37 PM.    Chief Complaint  Patient presents with  . Chest Pain  . Cough    Patient is a 32 y.o. male presenting with chest pain. The history is provided by the patient. No language interpreter was used.  Chest Pain Pain location:  L chest Pain quality: sharp   Duration:  2 hours Timing:  Constant Context: at rest   Ineffective treatments:  Aspirin Associated symptoms: headache   Associated symptoms: no cough, no diaphoresis, no dizziness, no nausea, no shortness of breath, not vomiting and no weakness     HPI Comments: Jermaine [name] is a 32 y.o. male with a h/o CAD who presents to the Emergency Department complaining of left-sided for the past 2 hours that started at rest. He describes the pain as sharp in nature that does not radiate. He denies any modifying factors including deep breathing and cough. He reports having pain in left jaw and left arm pain earlier that resolved. He reports taking two 325 mg ASA PTA with no improvement and rates his CP a 6 or 7 out of 10. He states that he was diagnosed with congenital heart disease 5 years ago by [facility] through a cardiac cath at [facility]. He reports a strong family h/o hypercoronary artery disease with his father and grandfather. He being on any cardiac related medications. He reports wearing a holter monitor for one month but is unable to give more context. He denies SOB, leg swelling, nausea and diaphoresis as associated symptoms. He denies having a h/o DVT or PE. He is also c/o HA attributed to HTN. The HA is similar to prior HTN HAs and he rates his pain a 9 out of 10 currently. BP is 163/103 in the ED.   Past Medical History  Diagnosis Date  . Hypertension   . CAD (coronary artery disease)    "a hole in bottom of heart" per pt   Past Surgical History  Procedure Laterality Date  . Appendectomy    . Tonsillectomy     No family history on file. History  Substance Use Topics  . Smoking status: Current Every Day Smoker -- 0.50 packs/day  . Smokeless tobacco: Not on file  . Alcohol Use: 0.0 oz/week     Comment: rare    Review of Systems  Constitutional: Negative for diaphoresis.  Respiratory: Negative for cough and shortness of breath.   Cardiovascular: Positive for chest pain. Negative for leg swelling.  Gastrointestinal: Negative for nausea and vomiting.  Neurological: Positive for headaches. Negative for dizziness and weakness.  All other systems reviewed and are negative.    Allergies  Vicodin  Home Medications   Current Outpatient Rx  Name  Route  Sig  Dispense  Refill  . amoxicillin (AMOXIL) 500 MG capsule   Oral   Take 1 capsule (500 mg total) by mouth 3 (three) times daily.   21 capsule   0   . diclofenac (VOLTAREN) 75 MG EC tablet   Oral   Take 1 tablet (75 mg total) by mouth 2 (two) times daily.   12 tablet   0   . ibuprofen (ADVIL,MOTRIN) 600 MG tablet   Oral   Take 1 tablet (600 mg total) by  mouth every 6 (six) hours as needed for pain.   30 tablet   0   . oxyCODONE-acetaminophen (PERCOCET/ROXICET) 5-325 MG per tablet   Oral   Take 2 tablets by mouth every 4 (four) hours as needed for pain.   6 tablet   0   . sulfamethoxazole-trimethoprim (SEPTRA DS) 800-160 MG per tablet   Oral   Take 1 tablet by mouth every 12 (twelve) hours.   14 tablet   0    Triage Vitals: BP 163/103  Pulse 85  Temp(Src) 97.5 F (36.4 C) (Oral)  Resp 24  Ht 6\' 2"  (1.88 m)  Wt 225 lb (102.059 kg)  BMI 28.88 kg/m2  SpO2 100%  Physical Exam  Nursing note and vitals reviewed. Constitutional: He is oriented to person, place, and time. He appears well-developed and well-nourished. No distress.  HENT:  Head: Normocephalic and atraumatic.  Right Ear:  External ear normal.  Left Ear: External ear normal.  Nose: Nose normal.  Mouth/Throat: Oropharynx is clear and moist.  Eyes: Conjunctivae and EOM are normal. Pupils are equal, round, and reactive to light.  Neck: Normal range of motion. No tracheal deviation present.  Cardiovascular: Normal rate, regular rhythm, normal heart sounds and intact distal pulses.   Pulmonary/Chest: Effort normal and breath sounds normal. No respiratory distress. He exhibits no tenderness (no reproducible chest tenderness).  Abdominal: Soft. There is no tenderness.  Musculoskeletal: Normal range of motion. He exhibits no edema.  Neurological: He is alert and oriented to person, place, and time. No cranial nerve deficit.  Skin: Skin is warm and dry.  Psychiatric: He has a normal mood and affect. His behavior is normal. Judgment and thought content normal.    ED Course   Medications  nitroGLYCERIN (NITROSTAT) SL tablet 0.4 mg (0.4 mg Sublingual Given 05/08/13 2051)    DIAGNOSTIC STUDIES: Oxygen Saturation is 100% on room air, normal by my interpretation.    COORDINATION OF CARE: 8:47 PM-Discussed treatment plan which includes NTG, CXR, CBC panel, BMP and troponin with pt at bedside and pt agreed to plan.   Procedures (including critical care time)  Labs Reviewed  CBC WITH DIFFERENTIAL  BASIC METABOLIC PANEL  TROPONIN I   No results found. No diagnosis found. Results for orders placed during the hospital encounter of 05/08/13  CBC WITH DIFFERENTIAL      Result Value Range   WBC 7.6  4.0 - 10.5 K/uL   RBC 4.37  4.22 - 5.81 MIL/uL   Hemoglobin 15.1  13.0 - 17.0 g/dL   HCT 16.1  09.6 - 04.5 %   MCV 95.4  78.0 - 100.0 fL   MCH 34.6 (*) 26.0 - 34.0 pg   MCHC 36.2 (*) 30.0 - 36.0 g/dL   RDW 40.9  81.1 - 91.4 %   Platelets 157  150 - 400 K/uL   Neutrophils Relative % 50  43 - 77 %   Neutro Abs 3.8  1.7 - 7.7 K/uL   Lymphocytes Relative 38  12 - 46 %   Lymphs Abs 2.8  0.7 - 4.0 K/uL   Monocytes  Relative 8  3 - 12 %   Monocytes Absolute 0.6  0.1 - 1.0 K/uL   Eosinophils Relative 4  0 - 5 %   Eosinophils Absolute 0.3  0.0 - 0.7 K/uL   Basophils Relative 0  0 - 1 %   Basophils Absolute 0.0  0.0 - 0.1 K/uL  BASIC METABOLIC PANEL  Result Value Range   Sodium 137  135 - 145 mEq/L   Potassium 3.5  3.5 - 5.1 mEq/L   Chloride 99  96 - 112 mEq/L   CO2 25  19 - 32 mEq/L   Glucose, Bld 103 (*) 70 - 99 mg/dL   BUN 9  6 - 23 mg/dL   Creatinine, Ser 4.01  0.50 - 1.35 mg/dL   Calcium 9.0  8.4 - 02.7 mg/dL   GFR calc non Af Amer >90  >90 mL/min   GFR calc Af Amer >90  >90 mL/min  TROPONIN I      Result Value Range   Troponin I <0.30  <0.30 ng/mL  POCT I-STAT TROPONIN I      Result Value Range   Troponin i, poc 0.00  0.00 - 0.08 ng/mL   Comment 3           POCT I-STAT, CHEM 8      Result Value Range   Sodium 141  135 - 145 mEq/L   Potassium 3.4 (*) 3.5 - 5.1 mEq/L   Chloride 103  96 - 112 mEq/L   BUN 8  6 - 23 mg/dL   Creatinine, Ser 2.53  0.50 - 1.35 mg/dL   Glucose, Bld 98  70 - 99 mg/dL   Calcium, Ion 6.64  4.03 - 1.23 mmol/L   TCO2 23  0 - 100 mmol/L   Hemoglobin 14.6  13.0 - 17.0 g/dL   HCT 47.4  25.9 - 56.3 %  POCT I-STAT TROPONIN I      Result Value Range   Troponin i, poc 0.00  0.00 - 0.08 ng/mL   Comment 3             Date: 05/08/2013  Rate: 84  Rhythm: normal sinus rhythm  QRS Axis: normal  Intervals: normal  ST/T Wave abnormalities: normal  Conduction Disutrbances: none  Narrative Interpretation: unremarkable  Dg Chest Portable 1 View  05/08/2013   *RADIOLOGY REPORT*  Clinical Data: 33 year old male with cough and chest pain  PORTABLE CHEST - 1 VIEW  Comparison: 08/07/2012 and prior chest radiographs  Findings: The cardiomediastinal silhouette is unremarkable. Elevation of the right hemidiaphragm is present. There is no evidence of focal airspace disease, pulmonary edema, suspicious pulmonary nodule/mass, pleural effusion, or pneumothorax. No acute bony  abnormalities are identified.  IMPRESSION: No evidence of active cardiopulmonary disease.   Original Report Authenticated By: Harmon Pier, M.D.      MDM  Patient with complaints of chest pain and headache.  Some complaints of cough.  Patient with normal ekg and troponin negative x2.  Headache not worrisome for sah or meningitis with no sudden onset, neuro deficit, or neck pain.  Pain is improved after acetaminophen.  Hilario Quarry, MD 05/08/13 8756  Hilario Quarry, MD 05/08/13 2239

## 2013-05-08 NOTE — ED Notes (Signed)
Pt still complaining about headache stating that the tylenol did not help. No mention of chest pain at this time. NAD noted.

## 2013-05-08 NOTE — ED Notes (Signed)
Performed EKG in Triage, moved patient to room 2.  Pulled old EKG from MUSE and handed both to Dr Rosalia Hammers.

## 2013-05-14 ENCOUNTER — Emergency Department (HOSPITAL_COMMUNITY)
Admission: EM | Admit: 2013-05-14 | Discharge: 2013-05-14 | Disposition: A | Discharge status: Home / Self Care | Payer: Self-pay | Attending: Emergency Medicine | Admitting: Emergency Medicine

## 2013-05-14 ENCOUNTER — Encounter (HOSPITAL_COMMUNITY): Payer: Self-pay

## 2013-05-14 ENCOUNTER — Emergency Department (HOSPITAL_COMMUNITY): Payer: Self-pay

## 2013-05-14 DIAGNOSIS — M5417 Radiculopathy, lumbosacral region: Secondary | ICD-10-CM

## 2013-05-14 DIAGNOSIS — F17200 Nicotine dependence, unspecified, uncomplicated: Secondary | ICD-10-CM | POA: Insufficient documentation

## 2013-05-14 DIAGNOSIS — Z87828 Personal history of other (healed) physical injury and trauma: Secondary | ICD-10-CM | POA: Insufficient documentation

## 2013-05-14 DIAGNOSIS — Y990 Civilian activity done for income or pay: Secondary | ICD-10-CM | POA: Insufficient documentation

## 2013-05-14 DIAGNOSIS — Y9389 Activity, other specified: Secondary | ICD-10-CM | POA: Insufficient documentation

## 2013-05-14 DIAGNOSIS — IMO0002 Reserved for concepts with insufficient information to code with codable children: Secondary | ICD-10-CM | POA: Insufficient documentation

## 2013-05-14 DIAGNOSIS — I10 Essential (primary) hypertension: Secondary | ICD-10-CM | POA: Insufficient documentation

## 2013-05-14 DIAGNOSIS — X503XXA Overexertion from repetitive movements, initial encounter: Secondary | ICD-10-CM | POA: Insufficient documentation

## 2013-05-14 DIAGNOSIS — Y9289 Other specified places as the place of occurrence of the external cause: Secondary | ICD-10-CM | POA: Insufficient documentation

## 2013-05-14 DIAGNOSIS — Z8774 Personal history of (corrected) congenital malformations of heart and circulatory system: Secondary | ICD-10-CM | POA: Insufficient documentation

## 2013-05-14 DIAGNOSIS — I2510 Atherosclerotic heart disease of native coronary artery without angina pectoris: Secondary | ICD-10-CM | POA: Insufficient documentation

## 2013-05-14 HISTORY — DX: Other injury of unspecified body region, initial encounter: T14.8XXA

## 2013-05-14 MED ORDER — MELOXICAM 7.5 MG PO TABS: 7.5000 mg | ORAL_TABLET | Freq: Every day | ORAL | Status: DC

## 2013-05-14 MED ORDER — CYCLOBENZAPRINE HCL 10 MG PO TABS: 10.0000 mg | ORAL_TABLET | Freq: Two times a day (BID) | ORAL | Status: DC | PRN

## 2013-05-14 MED ORDER — OXYCODONE-ACETAMINOPHEN 5-325 MG PO TABS
1.0000 | ORAL_TABLET | Freq: Once | ORAL | Status: AC
  Administered 2013-05-14: 1 via ORAL
  Filled 2013-05-14: qty 1

## 2013-05-14 MED ORDER — PREDNISONE 50 MG PO TABS
60.0000 mg | ORAL_TABLET | Freq: Once | ORAL | Status: AC
  Administered 2013-05-14: 60 mg via ORAL
  Filled 2013-05-14: qty 1

## 2013-05-14 MED ORDER — CYCLOBENZAPRINE HCL 10 MG PO TABS
10.0000 mg | ORAL_TABLET | Freq: Once | ORAL | Status: AC
  Administered 2013-05-14: 10 mg via ORAL
  Filled 2013-05-14: qty 1

## 2013-05-14 NOTE — ED Notes (Signed)
Pt reports "doing a job yesterday" and injured his low back area.
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
## 2013-05-15 NOTE — ED Provider Notes (Signed)
Medical screening examination/treatment/procedure(s) were performed by non-physician practitioner and as supervising physician I was immediately available for consultation/collaboration.  Micah Barnier R. Zaria Taha, MD 05/15/13 0034 

## 2013-07-03 ENCOUNTER — Encounter (HOSPITAL_COMMUNITY): Payer: Self-pay | Admitting: Emergency Medicine

## 2013-07-03 ENCOUNTER — Emergency Department (HOSPITAL_COMMUNITY)
Admission: EM | Admit: 2013-07-03 | Discharge: 2013-07-03 | Disposition: A | Discharge status: Home / Self Care | Payer: Self-pay | Attending: Emergency Medicine | Admitting: Emergency Medicine

## 2013-07-03 DIAGNOSIS — F17200 Nicotine dependence, unspecified, uncomplicated: Secondary | ICD-10-CM | POA: Insufficient documentation

## 2013-07-03 DIAGNOSIS — I10 Essential (primary) hypertension: Secondary | ICD-10-CM | POA: Insufficient documentation

## 2013-07-03 DIAGNOSIS — Z79899 Other long term (current) drug therapy: Secondary | ICD-10-CM | POA: Insufficient documentation

## 2013-07-03 DIAGNOSIS — Z8774 Personal history of (corrected) congenital malformations of heart and circulatory system: Secondary | ICD-10-CM | POA: Insufficient documentation

## 2013-07-03 DIAGNOSIS — X12XXXA Contact with other hot fluids, initial encounter: Secondary | ICD-10-CM | POA: Insufficient documentation

## 2013-07-03 DIAGNOSIS — T24139A Burn of first degree of unspecified lower leg, initial encounter: Secondary | ICD-10-CM | POA: Insufficient documentation

## 2013-07-03 DIAGNOSIS — T23202A Burn of second degree of left hand, unspecified site, initial encounter: Secondary | ICD-10-CM

## 2013-07-03 DIAGNOSIS — R209 Unspecified disturbances of skin sensation: Secondary | ICD-10-CM | POA: Insufficient documentation

## 2013-07-03 DIAGNOSIS — I2510 Atherosclerotic heart disease of native coronary artery without angina pectoris: Secondary | ICD-10-CM | POA: Insufficient documentation

## 2013-07-03 DIAGNOSIS — Y929 Unspecified place or not applicable: Secondary | ICD-10-CM | POA: Insufficient documentation

## 2013-07-03 DIAGNOSIS — T24102A Burn of first degree of unspecified site of left lower limb, except ankle and foot, initial encounter: Secondary | ICD-10-CM

## 2013-07-03 DIAGNOSIS — Y9389 Activity, other specified: Secondary | ICD-10-CM | POA: Insufficient documentation

## 2013-07-03 DIAGNOSIS — T23209A Burn of second degree of unspecified hand, unspecified site, initial encounter: Secondary | ICD-10-CM | POA: Insufficient documentation

## 2013-07-03 MED ORDER — OXYCODONE-ACETAMINOPHEN 5-325 MG PO TABS
1.0000 | ORAL_TABLET | Freq: Once | ORAL | Status: AC
  Administered 2013-07-03: 1 via ORAL
  Filled 2013-07-03: qty 1

## 2013-07-03 MED ORDER — TETANUS-DIPHTH-ACELL PERTUSSIS 5-2.5-18.5 LF-MCG/0.5 IM SUSP: 0.5000 mL | Freq: Once | INTRAMUSCULAR | Status: DC

## 2013-07-03 MED ORDER — BACITRACIN-NEOMYCIN-POLYMYXIN 400-5-5000 EX OINT
TOPICAL_OINTMENT | CUTANEOUS | Status: AC
  Administered 2013-07-03: 23:00:00
  Filled 2013-07-03: qty 1

## 2013-07-03 MED ORDER — CEPHALEXIN 500 MG PO CAPS
500.0000 mg | ORAL_CAPSULE | Freq: Once | ORAL | Status: AC
  Administered 2013-07-03: 500 mg via ORAL
  Filled 2013-07-03: qty 1

## 2013-07-03 MED ORDER — CEPHALEXIN 500 MG PO CAPS: 500.0000 mg | ORAL_CAPSULE | Freq: Four times a day (QID) | ORAL | Status: DC

## 2013-07-03 MED ORDER — OXYCODONE-ACETAMINOPHEN 5-325 MG PO TABS: 1.0000 | ORAL_TABLET | Freq: Four times a day (QID) | ORAL | Status: DC | PRN

## 2013-07-03 MED ORDER — BACITRACIN-NEOMYCIN-POLYMYXIN 400-5-5000 EX OINT
TOPICAL_OINTMENT | Freq: Once | CUTANEOUS | Status: AC
  Administered 2013-07-03: 23:00:00 via TOPICAL

## 2013-07-03 MED ORDER — KETOROLAC TROMETHAMINE 10 MG PO TABS
10.0000 mg | ORAL_TABLET | Freq: Once | ORAL | Status: AC
  Administered 2013-07-03: 10 mg via ORAL
  Filled 2013-07-03: qty 1

## 2013-07-03 NOTE — ED Notes (Signed)
Patient given coke per request. Vickie, RN in room.
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
## 2013-07-03 NOTE — ED Notes (Signed)
Neosporin ointment to rt hand  , adaptic, dressed. Ointment to lt leg.
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
## 2013-07-03 NOTE — ED Notes (Signed)
PA at bedside with patient

## 2013-07-06 ENCOUNTER — Encounter (HOSPITAL_COMMUNITY): Payer: Self-pay | Admitting: Emergency Medicine

## 2013-07-06 ENCOUNTER — Emergency Department (HOSPITAL_COMMUNITY)
Admission: EM | Admit: 2013-07-06 | Discharge: 2013-07-06 | Disposition: A | Discharge status: Home / Self Care | Payer: Self-pay | Attending: Emergency Medicine | Admitting: Emergency Medicine

## 2013-07-06 DIAGNOSIS — M79609 Pain in unspecified limb: Secondary | ICD-10-CM | POA: Insufficient documentation

## 2013-07-06 DIAGNOSIS — Q249 Congenital malformation of heart, unspecified: Secondary | ICD-10-CM | POA: Insufficient documentation

## 2013-07-06 DIAGNOSIS — T23201D Burn of second degree of right hand, unspecified site, subsequent encounter: Secondary | ICD-10-CM

## 2013-07-06 DIAGNOSIS — Z792 Long term (current) use of antibiotics: Secondary | ICD-10-CM | POA: Insufficient documentation

## 2013-07-06 DIAGNOSIS — F17200 Nicotine dependence, unspecified, uncomplicated: Secondary | ICD-10-CM | POA: Insufficient documentation

## 2013-07-06 DIAGNOSIS — I2510 Atherosclerotic heart disease of native coronary artery without angina pectoris: Secondary | ICD-10-CM | POA: Insufficient documentation

## 2013-07-06 DIAGNOSIS — Z4800 Encounter for change or removal of nonsurgical wound dressing: Secondary | ICD-10-CM | POA: Insufficient documentation

## 2013-07-06 DIAGNOSIS — I10 Essential (primary) hypertension: Secondary | ICD-10-CM | POA: Insufficient documentation

## 2013-07-06 MED ORDER — OXYCODONE HCL 5 MG PO TABS: 5.0000 mg | ORAL_TABLET | ORAL | Status: DC | PRN

## 2013-07-06 MED ORDER — SILVER SULFADIAZINE 1 % EX CREA
TOPICAL_CREAM | Freq: Once | CUTANEOUS | Status: AC
  Administered 2013-07-06: 17:00:00 via TOPICAL

## 2013-07-06 MED ORDER — SILVER SULFADIAZINE 1 % EX CREA
TOPICAL_CREAM | CUTANEOUS | Status: AC
  Filled 2013-07-06: qty 50
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
## 2013-07-07 NOTE — ED Provider Notes (Signed)
Medical screening examination/treatment/procedure(s) were performed by non-physician practitioner and as supervising physician I was immediately available for consultation/collaboration.    Celene Kras, MD 07/07/13 1134

## 2013-07-10 ENCOUNTER — Encounter (HOSPITAL_COMMUNITY): Payer: Self-pay | Admitting: Emergency Medicine

## 2013-07-10 ENCOUNTER — Emergency Department (HOSPITAL_COMMUNITY)
Admission: EM | Admit: 2013-07-10 | Discharge: 2013-07-10 | Disposition: A | Discharge status: Home / Self Care | Payer: Self-pay | Attending: Emergency Medicine | Admitting: Emergency Medicine

## 2013-07-10 DIAGNOSIS — Z792 Long term (current) use of antibiotics: Secondary | ICD-10-CM | POA: Insufficient documentation

## 2013-07-10 DIAGNOSIS — I2510 Atherosclerotic heart disease of native coronary artery without angina pectoris: Secondary | ICD-10-CM | POA: Insufficient documentation

## 2013-07-10 DIAGNOSIS — Z79899 Other long term (current) drug therapy: Secondary | ICD-10-CM | POA: Insufficient documentation

## 2013-07-10 DIAGNOSIS — Z5189 Encounter for other specified aftercare: Secondary | ICD-10-CM

## 2013-07-10 DIAGNOSIS — Q249 Congenital malformation of heart, unspecified: Secondary | ICD-10-CM | POA: Insufficient documentation

## 2013-07-10 DIAGNOSIS — F17200 Nicotine dependence, unspecified, uncomplicated: Secondary | ICD-10-CM | POA: Insufficient documentation

## 2013-07-10 DIAGNOSIS — I10 Essential (primary) hypertension: Secondary | ICD-10-CM | POA: Insufficient documentation

## 2013-07-10 DIAGNOSIS — Z87828 Personal history of other (healed) physical injury and trauma: Secondary | ICD-10-CM | POA: Insufficient documentation

## 2013-07-10 DIAGNOSIS — Z4800 Encounter for change or removal of nonsurgical wound dressing: Secondary | ICD-10-CM | POA: Insufficient documentation

## 2013-07-10 MED ORDER — SILVER SULFADIAZINE 1 % EX CREA
TOPICAL_CREAM | CUTANEOUS | Status: AC
  Filled 2013-07-10: qty 50

## 2013-07-10 MED ORDER — OXYCODONE-ACETAMINOPHEN 5-325 MG PO TABS
1.0000 | ORAL_TABLET | Freq: Once | ORAL | Status: AC
  Administered 2013-07-10: 1 via ORAL
  Filled 2013-07-10: qty 1

## 2013-07-10 MED ORDER — OXYCODONE-ACETAMINOPHEN 5-325 MG PO TABS: 1.0000 | ORAL_TABLET | ORAL | Status: DC | PRN

## 2013-07-10 MED ORDER — CEPHALEXIN 500 MG PO CAPS: 500.0000 mg | ORAL_CAPSULE | Freq: Three times a day (TID) | ORAL | Status: DC
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
## 2013-07-10 NOTE — ED Provider Notes (Signed)
Medical screening examination/treatment/procedure(s) were performed by non-physician practitioner and as supervising physician I was immediately available for consultation/collaboration.  Otto Felkins, MD 07/10/13 2318 

## 2013-07-16 ENCOUNTER — Encounter (HOSPITAL_COMMUNITY): Payer: Self-pay | Admitting: Emergency Medicine

## 2013-07-16 ENCOUNTER — Emergency Department (HOSPITAL_COMMUNITY)
Admission: EM | Admit: 2013-07-16 | Discharge: 2013-07-16 | Disposition: A | Discharge status: Home / Self Care | Payer: Self-pay | Attending: Emergency Medicine | Admitting: Emergency Medicine

## 2013-07-16 DIAGNOSIS — Z4800 Encounter for change or removal of nonsurgical wound dressing: Secondary | ICD-10-CM | POA: Insufficient documentation

## 2013-07-16 DIAGNOSIS — Z87828 Personal history of other (healed) physical injury and trauma: Secondary | ICD-10-CM | POA: Insufficient documentation

## 2013-07-16 DIAGNOSIS — Z79899 Other long term (current) drug therapy: Secondary | ICD-10-CM | POA: Insufficient documentation

## 2013-07-16 DIAGNOSIS — F17200 Nicotine dependence, unspecified, uncomplicated: Secondary | ICD-10-CM | POA: Insufficient documentation

## 2013-07-16 DIAGNOSIS — Z09 Encounter for follow-up examination after completed treatment for conditions other than malignant neoplasm: Secondary | ICD-10-CM

## 2013-07-16 DIAGNOSIS — I2510 Atherosclerotic heart disease of native coronary artery without angina pectoris: Secondary | ICD-10-CM | POA: Insufficient documentation

## 2013-07-16 DIAGNOSIS — I10 Essential (primary) hypertension: Secondary | ICD-10-CM | POA: Insufficient documentation

## 2013-07-16 MED ORDER — SILVER SULFADIAZINE 1 % EX CREA
TOPICAL_CREAM | Freq: Once | CUTANEOUS | Status: AC
  Administered 2013-07-16: 13:00:00 via TOPICAL
  Filled 2013-07-16: qty 50

## 2013-07-16 MED ORDER — OXYCODONE-ACETAMINOPHEN 5-325 MG PO TABS: 1.0000 | ORAL_TABLET | ORAL | Status: DC | PRN
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
## 2013-07-23 NOTE — ED Provider Notes (Signed)
Medical screening examination/treatment/procedure(s) were performed by non-physician practitioner and as supervising physician I was immediately available for consultation/collaboration.  Skylar Flynt M Samie Reasons, MD 07/23/13 1841 

## 2013-09-03 ENCOUNTER — Emergency Department (HOSPITAL_COMMUNITY): Payer: Self-pay

## 2013-09-03 ENCOUNTER — Emergency Department (HOSPITAL_COMMUNITY)
Admission: EM | Admit: 2013-09-03 | Discharge: 2013-09-03 | Disposition: A | Discharge status: Home / Self Care | Payer: Self-pay | Attending: Emergency Medicine | Admitting: Emergency Medicine

## 2013-09-03 ENCOUNTER — Encounter (HOSPITAL_COMMUNITY): Payer: Self-pay | Admitting: Emergency Medicine

## 2013-09-03 DIAGNOSIS — F17200 Nicotine dependence, unspecified, uncomplicated: Secondary | ICD-10-CM | POA: Insufficient documentation

## 2013-09-03 DIAGNOSIS — Z87828 Personal history of other (healed) physical injury and trauma: Secondary | ICD-10-CM | POA: Insufficient documentation

## 2013-09-03 DIAGNOSIS — Y929 Unspecified place or not applicable: Secondary | ICD-10-CM | POA: Insufficient documentation

## 2013-09-03 DIAGNOSIS — I10 Essential (primary) hypertension: Secondary | ICD-10-CM | POA: Insufficient documentation

## 2013-09-03 DIAGNOSIS — Q249 Congenital malformation of heart, unspecified: Secondary | ICD-10-CM | POA: Insufficient documentation

## 2013-09-03 DIAGNOSIS — Y9301 Activity, walking, marching and hiking: Secondary | ICD-10-CM | POA: Insufficient documentation

## 2013-09-03 DIAGNOSIS — I2510 Atherosclerotic heart disease of native coronary artery without angina pectoris: Secondary | ICD-10-CM | POA: Insufficient documentation

## 2013-09-03 DIAGNOSIS — W1809XA Striking against other object with subsequent fall, initial encounter: Secondary | ICD-10-CM | POA: Insufficient documentation

## 2013-09-03 DIAGNOSIS — S8391XA Sprain of unspecified site of right knee, initial encounter: Secondary | ICD-10-CM

## 2013-09-03 DIAGNOSIS — IMO0002 Reserved for concepts with insufficient information to code with codable children: Secondary | ICD-10-CM | POA: Insufficient documentation

## 2013-09-03 MED ORDER — IBUPROFEN 600 MG PO TABS: 600.0000 mg | ORAL_TABLET | Freq: Four times a day (QID) | ORAL | Status: DC | PRN

## 2013-09-03 MED ORDER — OXYCODONE-ACETAMINOPHEN 5-325 MG PO TABS
2.0000 | ORAL_TABLET | Freq: Once | ORAL | Status: AC
  Administered 2013-09-03: 2 via ORAL
  Filled 2013-09-03: qty 2

## 2013-09-03 MED ORDER — OXYCODONE-ACETAMINOPHEN 5-325 MG PO TABS: 1.0000 | ORAL_TABLET | ORAL | Status: DC | PRN

## 2013-09-03 MED ORDER — IBUPROFEN 800 MG PO TABS
800.0000 mg | ORAL_TABLET | Freq: Once | ORAL | Status: AC
  Administered 2013-09-03: 800 mg via ORAL
  Filled 2013-09-03: qty 1
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
## 2013-09-06 NOTE — ED Provider Notes (Signed)
Medical screening examination/treatment/procedure(s) were performed by non-physician practitioner and as supervising physician I was immediately available for consultation/collaboration.  EKG Interpretation   None         Jakylah Bassinger L Jene Huq, MD 09/06/13 2315 

## 2013-09-21 ENCOUNTER — Emergency Department (HOSPITAL_COMMUNITY): Payer: Self-pay

## 2013-09-21 ENCOUNTER — Encounter (HOSPITAL_COMMUNITY): Payer: Self-pay | Admitting: Emergency Medicine

## 2013-09-21 ENCOUNTER — Emergency Department (HOSPITAL_COMMUNITY)
Admission: EM | Admit: 2013-09-21 | Discharge: 2013-09-21 | Disposition: A | Discharge status: Home / Self Care | Payer: Self-pay | Attending: Emergency Medicine | Admitting: Emergency Medicine

## 2013-09-21 DIAGNOSIS — I10 Essential (primary) hypertension: Secondary | ICD-10-CM | POA: Insufficient documentation

## 2013-09-21 DIAGNOSIS — F17200 Nicotine dependence, unspecified, uncomplicated: Secondary | ICD-10-CM | POA: Insufficient documentation

## 2013-09-21 DIAGNOSIS — K209 Esophagitis, unspecified without bleeding: Secondary | ICD-10-CM | POA: Insufficient documentation

## 2013-09-21 DIAGNOSIS — I2510 Atherosclerotic heart disease of native coronary artery without angina pectoris: Secondary | ICD-10-CM | POA: Insufficient documentation

## 2013-09-21 DIAGNOSIS — Z87828 Personal history of other (healed) physical injury and trauma: Secondary | ICD-10-CM | POA: Insufficient documentation

## 2013-09-21 DIAGNOSIS — Q249 Congenital malformation of heart, unspecified: Secondary | ICD-10-CM | POA: Insufficient documentation

## 2013-09-21 LAB — COMPREHENSIVE METABOLIC PANEL
ALBUMIN: 4.7 g/dL (ref 3.5–5.2)
ALT: 26 U/L (ref 0–53)
AST: 25 U/L (ref 0–37)
Alkaline Phosphatase: 69 U/L (ref 39–117)
BUN: 11 mg/dL (ref 6–23)
CALCIUM: 9.7 mg/dL (ref 8.4–10.5)
CO2: 28 mEq/L (ref 19–32)
CREATININE: 0.97 mg/dL (ref 0.50–1.35)
Chloride: 97 mEq/L (ref 96–112)
GFR calc Af Amer: >90 mL/min (ref >90)
Glucose, Bld: 85 mg/dL (ref 70–99)
Potassium: 4.2 mEq/L (ref 3.7–5.3)
Sodium: 139 mEq/L (ref 137–147)
Total Bilirubin: 0.6 mg/dL (ref 0.3–1.2)
Total Protein: 7.7 g/dL (ref 6.0–8.3)

## 2013-09-21 LAB — CBC WITH DIFFERENTIAL/PLATELET
BASOS PCT: 0 % (ref 0–1)
Basophils Absolute: 0 10*3/uL (ref 0.0–0.1)
EOS PCT: 1 % (ref 0–5)
Eosinophils Absolute: 0.1 10*3/uL (ref 0.0–0.7)
HEMATOCRIT: 44.4 % (ref 39.0–52.0)
HEMOGLOBIN: 16 g/dL (ref 13.0–17.0)
Lymphocytes Relative: 25 % (ref 12–46)
Lymphs Abs: 2.5 10*3/uL (ref 0.7–4.0)
MCH: 34.2 pg — AB (ref 26.0–34.0)
MCHC: 36 g/dL (ref 30.0–36.0)
MCV: 94.9 fL (ref 78.0–100.0)
MONO ABS: 0.7 10*3/uL (ref 0.1–1.0)
Monocytes Relative: 7 % (ref 3–12)
Neutro Abs: 6.7 10*3/uL (ref 1.7–7.7)
Neutrophils Relative %: 67 % (ref 43–77)
Platelets: 165 10*3/uL (ref 150–400)
RBC: 4.68 MIL/uL (ref 4.22–5.81)
RDW: 12.3 % (ref 11.5–15.5)
WBC: 10.1 10*3/uL (ref 4.0–10.5)

## 2013-09-21 LAB — LIPASE, BLOOD: LIPASE: 28 U/L (ref 11–59)

## 2013-09-21 LAB — POCT I-STAT TROPONIN I: TROPONIN I, POC: 0.02 ng/mL (ref 0.00–0.08)

## 2013-09-21 MED ORDER — ONDANSETRON HCL 4 MG/2ML IJ SOLN
4.0000 mg | Freq: Once | INTRAMUSCULAR | Status: AC
  Administered 2013-09-21: 4 mg via INTRAMUSCULAR
  Filled 2013-09-21: qty 2

## 2013-09-21 MED ORDER — PANTOPRAZOLE SODIUM 20 MG PO TBEC: 20.0000 mg | DELAYED_RELEASE_TABLET | Freq: Every day | ORAL | Status: DC

## 2013-09-21 MED ORDER — GI COCKTAIL ~~LOC~~
30.0000 mL | Freq: Once | ORAL | Status: AC
  Administered 2013-09-21: 30 mL via ORAL
  Filled 2013-09-21: qty 30

## 2013-09-21 MED ORDER — PANTOPRAZOLE SODIUM 40 MG IV SOLR
40.0000 mg | Freq: Once | INTRAVENOUS | Status: AC
  Administered 2013-09-21: 40 mg via INTRAVENOUS
  Filled 2013-09-21: qty 40

## 2013-09-21 MED ORDER — SODIUM CHLORIDE 0.9 % IV BOLUS (SEPSIS)
1000.0000 mL | Freq: Once | INTRAVENOUS | Status: AC
Start: 2013-09-21 — End: 2013-09-21
  Administered 2013-09-21: 1000 mL via INTRAVENOUS

## 2013-09-21 NOTE — Discharge Instructions (Signed)
Esophagitis  Esophagitis is inflammation of the esophagus. It can involve swelling, soreness, and pain in the esophagus. This condition can make it difficult and painful to swallow.  CAUSES   Most causes of esophagitis are not serious. Many different factors can cause esophagitis, including:   Gastroesophageal reflux disease (GERD). This is when acid from your stomach flows up into the esophagus.   Recurrent vomiting.   An allergic-type reaction.   Certain medicines, especially those that come in large pills.   Ingestion of harmful chemicals, such as household cleaning products.   Heavy alcohol use.   An infection of the esophagus.   Radiation treatment for cancer.   Certain diseases such as sarcoidosis, Crohn's disease, and scleroderma. These diseases may cause recurrent esophagitis.  SYMPTOMS    Trouble swallowing.   Painful swallowing.   Chest pain.   Difficulty breathing.   Nausea.   Vomiting.   Abdominal pain.  DIAGNOSIS   Your caregiver will take your history and do a physical exam. Depending upon what your caregiver finds, certain tests may also be done, including:   Barium X-Jasn Xia. You will drink a solution that coats the esophagus, and X-rays will be taken.   Endoscopy. A lighted tube is put down the esophagus so your caregiver can examine the area.   Allergy tests. These can sometimes be arranged through follow-up visits.  TREATMENT   Treatment will depend on the cause of your esophagitis. In some cases, steroids or other medicines may be given to help relieve your symptoms or to treat the underlying cause of your condition. Medicines that may be recommended include:   Viscous lidocaine, to soothe the esophagus.   Antacids.   Acid reducers.   Proton pump inhibitors.   Antiviral medicines for certain viral infections of the esophagus.   Antifungal medicines for certain fungal infections of the esophagus.   Antibiotic medicines, depending on the cause of the esophagitis.  HOME CARE  INSTRUCTIONS    Avoid foods and drinks that seem to make your symptoms worse.   Eat small, frequent meals instead of large meals.   Avoid eating for the 3 hours prior to your bedtime.   If you have trouble taking pills, use a pill splitter to decrease the size and likelihood of the pill getting stuck or injuring the esophagus on the way down. Drinking water after taking a pill also helps.   Stop smoking if you smoke.   Maintain a healthy weight.   Wear loose-fitting clothing. Do not wear anything tight around your waist that causes pressure on your stomach.   Raise the head of your bed 6 to 8 inches with wood blocks to help you sleep. Extra pillows will not help.   Only take over-the-counter or prescription medicines as directed by your caregiver.  SEEK IMMEDIATE MEDICAL CARE IF:   You have severe chest pain that radiates into your arm, neck, or jaw.   You feel sweaty, dizzy, or lightheaded.   You have shortness of breath.   You vomit blood.   You have difficulty or pain with swallowing.   You have bloody or black, tarry stools.   You have a fever.   You have a burning sensation in the chest more than 3 times a week for more than 2 weeks.   You cannot swallow, drink, or eat.   You drool because you cannot swallow your saliva.  MAKE SURE YOU:   Understand these instructions.   Will watch your condition.     Will get help right away if you are not doing well or get worse.  Document Released: 10/15/2004 Document Revised: 11/30/2011 Document Reviewed: 05/08/2011  [employer] Patient Information 2014 [employer].
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
## 2013-09-21 NOTE — ED Notes (Signed)
Started having chest pain last night then I started vomiting. Having a sharp stabbing pain in my chest per pt.

## 2013-09-28 ENCOUNTER — Encounter (HOSPITAL_COMMUNITY): Payer: Self-pay | Admitting: Emergency Medicine

## 2013-09-28 ENCOUNTER — Emergency Department (HOSPITAL_COMMUNITY)
Admission: EM | Admit: 2013-09-28 | Discharge: 2013-09-28 | Disposition: A | Discharge status: Home / Self Care | Payer: Self-pay | Attending: Emergency Medicine | Admitting: Emergency Medicine

## 2013-09-28 ENCOUNTER — Emergency Department (HOSPITAL_COMMUNITY): Payer: Self-pay

## 2013-09-28 DIAGNOSIS — K209 Esophagitis, unspecified without bleeding: Secondary | ICD-10-CM | POA: Insufficient documentation

## 2013-09-28 DIAGNOSIS — I2510 Atherosclerotic heart disease of native coronary artery without angina pectoris: Secondary | ICD-10-CM | POA: Insufficient documentation

## 2013-09-28 DIAGNOSIS — Z79899 Other long term (current) drug therapy: Secondary | ICD-10-CM | POA: Insufficient documentation

## 2013-09-28 DIAGNOSIS — Z87828 Personal history of other (healed) physical injury and trauma: Secondary | ICD-10-CM | POA: Insufficient documentation

## 2013-09-28 DIAGNOSIS — R079 Chest pain, unspecified: Secondary | ICD-10-CM

## 2013-09-28 DIAGNOSIS — R5383 Other fatigue: Secondary | ICD-10-CM

## 2013-09-28 DIAGNOSIS — M549 Dorsalgia, unspecified: Secondary | ICD-10-CM | POA: Insufficient documentation

## 2013-09-28 DIAGNOSIS — F17200 Nicotine dependence, unspecified, uncomplicated: Secondary | ICD-10-CM | POA: Insufficient documentation

## 2013-09-28 DIAGNOSIS — R05 Cough: Secondary | ICD-10-CM | POA: Insufficient documentation

## 2013-09-28 DIAGNOSIS — R1110 Vomiting, unspecified: Secondary | ICD-10-CM | POA: Insufficient documentation

## 2013-09-28 DIAGNOSIS — R5381 Other malaise: Secondary | ICD-10-CM | POA: Insufficient documentation

## 2013-09-28 DIAGNOSIS — Z9089 Acquired absence of other organs: Secondary | ICD-10-CM | POA: Insufficient documentation

## 2013-09-28 DIAGNOSIS — R059 Cough, unspecified: Secondary | ICD-10-CM | POA: Insufficient documentation

## 2013-09-28 DIAGNOSIS — Q249 Congenital malformation of heart, unspecified: Secondary | ICD-10-CM | POA: Insufficient documentation

## 2013-09-28 DIAGNOSIS — I10 Essential (primary) hypertension: Secondary | ICD-10-CM | POA: Insufficient documentation

## 2013-09-28 LAB — BASIC METABOLIC PANEL
BUN: 9 mg/dL (ref 6–23)
CHLORIDE: 96 meq/L (ref 96–112)
CO2: 27 meq/L (ref 19–32)
Calcium: 9.5 mg/dL (ref 8.4–10.5)
Creatinine, Ser: 0.99 mg/dL (ref 0.50–1.35)
GFR calc Af Amer: >90 mL/min (ref >90)
GFR calc non Af Amer: >90 mL/min (ref >90)
GLUCOSE: 89 mg/dL (ref 70–99)
Potassium: 3.8 mEq/L (ref 3.7–5.3)
SODIUM: 135 meq/L — AB (ref 137–147)

## 2013-09-28 LAB — TRIGLYCERIDES: Triglycerides: 154 mg/dL — ABNORMAL HIGH (ref <150)

## 2013-09-28 LAB — CBC WITH DIFFERENTIAL/PLATELET
Basophils Absolute: 0 10*3/uL (ref 0.0–0.1)
Basophils Relative: 0 % (ref 0–1)
Eosinophils Absolute: 0.1 10*3/uL (ref 0.0–0.7)
Eosinophils Relative: 1 % (ref 0–5)
HCT: 41.6 % (ref 39.0–52.0)
Hemoglobin: 15 g/dL (ref 13.0–17.0)
LYMPHS ABS: 1.8 10*3/uL (ref 0.7–4.0)
LYMPHS PCT: 26 % (ref 12–46)
MCH: 33.9 pg (ref 26.0–34.0)
MCHC: 36.1 g/dL — ABNORMAL HIGH (ref 30.0–36.0)
MCV: 94.1 fL (ref 78.0–100.0)
Monocytes Absolute: 0.7 10*3/uL (ref 0.1–1.0)
Monocytes Relative: 10 % (ref 3–12)
NEUTROS ABS: 4.3 10*3/uL (ref 1.7–7.7)
NEUTROS PCT: 63 % (ref 43–77)
PLATELETS: 145 10*3/uL — AB (ref 150–400)
RBC: 4.42 MIL/uL (ref 4.22–5.81)
RDW: 12.2 % (ref 11.5–15.5)
WBC: 6.9 10*3/uL (ref 4.0–10.5)

## 2013-09-28 MED ORDER — GI COCKTAIL ~~LOC~~
30.0000 mL | Freq: Once | ORAL | Status: AC
  Administered 2013-09-28: 30 mL via ORAL

## 2013-09-28 MED ORDER — SUCRALFATE 1 GM/10ML PO SUSP: 1.0000 g | Freq: Three times a day (TID) | ORAL | Status: DC

## 2013-09-28 MED ORDER — PREDNISONE 10 MG PO TABS
60.0000 mg | ORAL_TABLET | ORAL | Status: AC
  Administered 2013-09-28: 60 mg via ORAL
  Filled 2013-09-28 (×2): qty 1

## 2013-09-28 MED ORDER — PANTOPRAZOLE SODIUM 20 MG PO TBEC: 20.0000 mg | DELAYED_RELEASE_TABLET | Freq: Every day | ORAL | Status: DC

## 2013-09-28 MED ORDER — GI COCKTAIL ~~LOC~~
ORAL | Status: DC
Start: 2013-09-28 — End: 2013-09-29
  Filled 2013-09-28: qty 30

## 2013-09-28 MED ORDER — PREDNISONE 20 MG PO TABS: 60.0000 mg | ORAL_TABLET | Freq: Every day | ORAL | Status: AC

## 2013-09-28 NOTE — ED Notes (Signed)
Pain central chest , seen here for same 1/1 and dx with esophagitis, relief with drinking cold water. Cough,nonproductive.
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
## 2013-09-28 NOTE — Discharge Instructions (Signed)
As discussed, your evaluation tonight has been largely reassuring.  Your symptoms are most consistent with esophagitis.  Pretibial medication as directed, and be sure to follow up with our gastroenterologist.  Return here for concerning changes in your condition.

## 2013-09-28 NOTE — ED Notes (Signed)
No answer when called , registration says he was going to his truck

## 2013-10-04 ENCOUNTER — Emergency Department (HOSPITAL_COMMUNITY)
Admission: EM | Admit: 2013-10-04 | Discharge: 2013-10-04 | Disposition: A | Discharge status: Home / Self Care | Payer: No Typology Code available for payment source | Attending: Emergency Medicine | Admitting: Emergency Medicine

## 2013-10-04 ENCOUNTER — Encounter (HOSPITAL_COMMUNITY): Payer: Self-pay | Admitting: Emergency Medicine

## 2013-10-04 ENCOUNTER — Emergency Department (HOSPITAL_COMMUNITY): Payer: No Typology Code available for payment source

## 2013-10-04 DIAGNOSIS — S335XXA Sprain of ligaments of lumbar spine, initial encounter: Secondary | ICD-10-CM | POA: Insufficient documentation

## 2013-10-04 DIAGNOSIS — I10 Essential (primary) hypertension: Secondary | ICD-10-CM | POA: Insufficient documentation

## 2013-10-04 DIAGNOSIS — Q249 Congenital malformation of heart, unspecified: Secondary | ICD-10-CM | POA: Insufficient documentation

## 2013-10-04 DIAGNOSIS — S39012A Strain of muscle, fascia and tendon of lower back, initial encounter: Secondary | ICD-10-CM

## 2013-10-04 DIAGNOSIS — Z79899 Other long term (current) drug therapy: Secondary | ICD-10-CM | POA: Insufficient documentation

## 2013-10-04 DIAGNOSIS — I2510 Atherosclerotic heart disease of native coronary artery without angina pectoris: Secondary | ICD-10-CM | POA: Insufficient documentation

## 2013-10-04 DIAGNOSIS — Z9089 Acquired absence of other organs: Secondary | ICD-10-CM | POA: Insufficient documentation

## 2013-10-04 DIAGNOSIS — Y92410 Unspecified street and highway as the place of occurrence of the external cause: Secondary | ICD-10-CM | POA: Insufficient documentation

## 2013-10-04 DIAGNOSIS — Y9389 Activity, other specified: Secondary | ICD-10-CM | POA: Insufficient documentation

## 2013-10-04 DIAGNOSIS — S0990XA Unspecified injury of head, initial encounter: Secondary | ICD-10-CM | POA: Insufficient documentation

## 2013-10-04 DIAGNOSIS — S139XXA Sprain of joints and ligaments of unspecified parts of neck, initial encounter: Secondary | ICD-10-CM | POA: Insufficient documentation

## 2013-10-04 DIAGNOSIS — S161XXA Strain of muscle, fascia and tendon at neck level, initial encounter: Secondary | ICD-10-CM

## 2013-10-04 DIAGNOSIS — F17200 Nicotine dependence, unspecified, uncomplicated: Secondary | ICD-10-CM | POA: Insufficient documentation

## 2013-10-04 DIAGNOSIS — S0510XA Contusion of eyeball and orbital tissues, unspecified eye, initial encounter: Secondary | ICD-10-CM | POA: Insufficient documentation

## 2013-10-04 MED ORDER — IOHEXOL 300 MG/ML  SOLN
100.0000 mL | Freq: Once | INTRAMUSCULAR | Status: AC | PRN
  Administered 2013-10-04: 100 mL via INTRAVENOUS

## 2013-10-04 MED ORDER — OXYCODONE-ACETAMINOPHEN 5-325 MG PO TABS
2.0000 | ORAL_TABLET | Freq: Once | ORAL | Status: AC
  Administered 2013-10-04: 2 via ORAL
  Filled 2013-10-04: qty 2

## 2013-10-04 MED ORDER — FENTANYL CITRATE 0.05 MG/ML IJ SOLN
50.0000 ug | Freq: Once | INTRAMUSCULAR | Status: AC
  Administered 2013-10-04: 50 ug via INTRAVENOUS
  Filled 2013-10-04: qty 2
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
## 2013-10-04 NOTE — ED Notes (Signed)
Patient stating he "can't pee". Able to urinate some in urinal, but falls asleep easily and cannot concentrate. In and out completed because patient request.
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
## 2013-10-04 NOTE — ED Notes (Signed)
Patient lethargic, sternal rub to arouse.

## 2013-10-04 NOTE — ED Notes (Addendum)
EMS reports + ETOH. Also c/o neck and back pain. Alert/oriented x 4. No distress noted upon arrival.

## 2014-10-04 ENCOUNTER — Emergency Department (HOSPITAL_COMMUNITY)
Admission: EM | Admit: 2014-10-04 | Discharge: 2014-10-04 | Disposition: A | Discharge status: Home / Self Care | Payer: No Typology Code available for payment source | Attending: Emergency Medicine | Admitting: Emergency Medicine

## 2014-10-04 ENCOUNTER — Encounter (HOSPITAL_COMMUNITY): Payer: Self-pay | Admitting: Emergency Medicine

## 2014-10-04 DIAGNOSIS — R000 Tachycardia, unspecified: Secondary | ICD-10-CM | POA: Insufficient documentation

## 2014-10-04 DIAGNOSIS — Y93G3 Activity, cooking and baking: Secondary | ICD-10-CM | POA: Insufficient documentation

## 2014-10-04 DIAGNOSIS — I2510 Atherosclerotic heart disease of native coronary artery without angina pectoris: Secondary | ICD-10-CM | POA: Insufficient documentation

## 2014-10-04 DIAGNOSIS — Y998 Other external cause status: Secondary | ICD-10-CM | POA: Insufficient documentation

## 2014-10-04 DIAGNOSIS — T23221A Burn of second degree of single right finger (nail) except thumb, initial encounter: Secondary | ICD-10-CM | POA: Insufficient documentation

## 2014-10-04 DIAGNOSIS — X12XXXA Contact with other hot fluids, initial encounter: Secondary | ICD-10-CM | POA: Insufficient documentation

## 2014-10-04 DIAGNOSIS — Z720 Tobacco use: Secondary | ICD-10-CM | POA: Insufficient documentation

## 2014-10-04 DIAGNOSIS — Q249 Congenital malformation of heart, unspecified: Secondary | ICD-10-CM | POA: Insufficient documentation

## 2014-10-04 DIAGNOSIS — Z79899 Other long term (current) drug therapy: Secondary | ICD-10-CM | POA: Insufficient documentation

## 2014-10-04 DIAGNOSIS — Y9289 Other specified places as the place of occurrence of the external cause: Secondary | ICD-10-CM | POA: Insufficient documentation

## 2014-10-04 DIAGNOSIS — I10 Essential (primary) hypertension: Secondary | ICD-10-CM | POA: Insufficient documentation

## 2014-10-04 MED ORDER — OXYCODONE-ACETAMINOPHEN 5-325 MG PO TABS: 1.0000 | ORAL_TABLET | Freq: Four times a day (QID) | ORAL | Status: DC | PRN

## 2014-10-04 MED ORDER — SILVER SULFADIAZINE 1 % EX CREA
TOPICAL_CREAM | Freq: Two times a day (BID) | CUTANEOUS | Status: DC
  Administered 2014-10-04 (×2): via TOPICAL
  Filled 2014-10-04: qty 50

## 2014-10-04 NOTE — Discharge Instructions (Signed)
Change the dressing every 12 hours and return in 2 days for recheck. Return sooner for any problems.
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
## 2014-10-06 ENCOUNTER — Emergency Department (HOSPITAL_COMMUNITY)
Admission: EM | Admit: 2014-10-06 | Discharge: 2014-10-06 | Disposition: A | Discharge status: Home / Self Care | Payer: No Typology Code available for payment source | Attending: Emergency Medicine | Admitting: Emergency Medicine

## 2014-10-06 ENCOUNTER — Encounter (HOSPITAL_COMMUNITY): Payer: Self-pay | Admitting: *Deleted

## 2014-10-06 DIAGNOSIS — Z5189 Encounter for other specified aftercare: Secondary | ICD-10-CM

## 2014-10-06 DIAGNOSIS — Z4800 Encounter for change or removal of nonsurgical wound dressing: Secondary | ICD-10-CM | POA: Insufficient documentation

## 2014-10-06 DIAGNOSIS — Z79899 Other long term (current) drug therapy: Secondary | ICD-10-CM | POA: Insufficient documentation

## 2014-10-06 DIAGNOSIS — I10 Essential (primary) hypertension: Secondary | ICD-10-CM | POA: Insufficient documentation

## 2014-10-06 DIAGNOSIS — Z720 Tobacco use: Secondary | ICD-10-CM | POA: Insufficient documentation

## 2014-10-06 DIAGNOSIS — I2510 Atherosclerotic heart disease of native coronary artery without angina pectoris: Secondary | ICD-10-CM | POA: Insufficient documentation

## 2014-10-06 DIAGNOSIS — IMO0002 Reserved for concepts with insufficient information to code with codable children: Secondary | ICD-10-CM

## 2014-10-06 MED ORDER — OXYCODONE-ACETAMINOPHEN 5-325 MG PO TABS: 2.0000 | ORAL_TABLET | ORAL | Status: DC | PRN

## 2014-10-06 MED ORDER — SILVER SULFADIAZINE 1 % EX CREA
TOPICAL_CREAM | CUTANEOUS | Status: AC
  Filled 2014-10-06: qty 50

## 2014-10-06 MED ORDER — OXYCODONE-ACETAMINOPHEN 5-325 MG PO TABS: 1.0000 | ORAL_TABLET | Freq: Three times a day (TID) | ORAL | Status: DC | PRN

## 2014-10-06 NOTE — Discharge Instructions (Signed)
Please call your doctor for a followup appointment within 24-48 hours. When you talk to your doctor please let them know that you were seen in the emergency department and have them acquire all of your records so that they can discuss the findings with you and formulate a treatment plan to fully care for your new and ongoing problems. Please follow-up with hand specialist if site is to worsen Please continue to keep site clean by washing with warm water and soap Please continue to apply clean dressings daily Please continue to apply Silvadene 2 times per day Please take medications as prescribed - while on pain medications there is to be no drinking alcohol, driving, operating any heavy machinery. If extra please dispose in a proper manner. Please do not take any extra Tylenol with this medication for this can lead to Tylenol overdose and liver issues.  Please continue to monitor symptoms closely and if symptoms are to worsen or change (fever greater than 101, chills, sweating, nausea, vomiting, chest pain, shortness of breathe, difficulty breathing, weakness, numbness, tingling, worsening or changes to pain pattern, swelling, redness, red streaks, pus drainage, numbness, tingling, fall, injury, decreased range of motion to the right ring finger) please report back to the Emergency Department immediately.    Burn Care Burns hurt your skin. When your skin is hurt, it is easier to get an infection. Follow your doctor's directions to help prevent an infection. HOME CARE  Wash your hands well before you change your bandage.  Change your bandage as often as told by your doctor.  Remove the old bandage. If the bandage sticks, soak it off with cool, clean water.  Gently clean the burn with mild soap and water.  Pat the burn dry with a clean, dry cloth.  Put a thin layer of medicated cream on the burn.  Put a clean bandage on as told by your doctor.  Keep the bandage clean and dry.  Raise  (elevate) the burn for the first 24 hours. After that, follow your doctor's directions.  Only take medicine as told by your doctor. GET HELP RIGHT AWAY IF:   You have too much pain.  The skin near the burn is red, tender, puffy (swollen), or has red streaks.  The burn area has yellowish white fluid (pus) or a bad smell coming from it.  You have a fever. MAKE SURE YOU:   Understand these instructions.  Will watch your condition.  Will get help right away if you are not doing well or get worse. Document Released: 06/16/2008 Document Revised: 11/30/2011 Document Reviewed: 01/28/2011 [facility] Patient Information 2015 [place], [place]. This information is not intended to replace advice given to you by your health care provider. Make sure you discuss any questions you have with your health care provider.   Emergency Department Resource Guide 1) Find a Doctor and Pay Out of Pocket Although you won't have to find out who is covered by your insurance plan, it is a good idea to ask around and get recommendations. You will then need to call the office and see if the doctor you have chosen will accept you as a new patient and what types of options they offer for patients who are self-pay. Some doctors offer discounts or will set up payment plans for their patients who do not have insurance, but you will need to ask so you aren't surprised when you get to your appointment.  2) Contact Your Local Health Department Not all health departments have  doctors that can see patients for sick visits, but many do, so it is worth a call to see if yours does. If you don't know where your local health department is, you can check in your phone book. The CDC also has a tool to help you locate your state's health department, and many state websites also have listings of all of their local health departments.  3) Find a [facility] If your illness is not likely to be very severe or complicated, you may want to  try a walk in clinic. These are popping up all over the country in pharmacies, drugstores, and shopping centers. They're usually staffed by nurse practitioners or physician assistants that have been trained to treat common illnesses and complaints. They're usually fairly quick and inexpensive. However, if you have serious medical issues or chronic medical problems, these are probably not your best option.  No Primary Care Doctor: - Call Health Connect at  [phone] - they can help you locate a primary care doctor that  accepts your insurance, provides certain services, etc. - Physician Referral Service- [phone]  Chronic Pain Problems: Organization         Address  Phone   Notes  [facility]  [phone] Patients need to be referred by their primary care doctor.   Medication Assistance: Organization         Address  Phone   Notes  [facility] Medication [facility] [address] [place], [place] [address] [phone] --Must be a resident of [facility] -- Must have NO insurance coverage whatsoever (no Medicaid/ Medicare, etc.) -- The pt. MUST have a primary care doctor that directs their care regularly and follows them in the community   Med[facility]ist  [phone]   [employer]  [phone]    Agencies that provide inexpensive medical care: Organization         Address  Phone   Notes  [place]  [phone]   [facility]    [phone]   [facility] [place], [place] [address] [phone]   [place] [address] [place] [phone]   Planned Parenthood    [phone]   [facility]    [phone]   [place] and [place] [address], [place] Phone:  [phone], Fax:  [phone] Hours of Operation:  9 am - 6 pm, M-F.  Also accepts Medicaid/Medicare and self-pay.  [facility] for [place], [address], [place] Phone: [phone], Fax: [phone]. Hours of Operation:  8:30 am - 5:30 pm, M-F.  Also accepts Medicaid and self-pay.  [facility] [place] [address], [place] Phone: [phone]   [place], [place], [place] [phone], Ext. [phone] Mondays & Thursdays: 7-9 AM.  First 15 patients are seen on a first come, first serve basis.    [place] Providers:  Organization         Address  Phone   Notes  [facility] [address], [place] [phone] Also accepts self-pay patients.  [place], [place]  [phone]   [place], [address], [place] [phone]   [facility] [address] [place], [place] [phone]   Lucianne Lei [address], [place]   [phone] Only accepts [place] Access [place] patients after they have their name applied to their card.   Self-Pay (no insurance) in [facility]:  Organization         Address  Phone   Notes  Sickle Cell Patients, [facility] [place] [phone]   [facility] [place] [phone]   [facility] [place]  [place], [place], [place] [phone]   [facility]/Dr. Osei-Bonsu  [address], [place] or [address], [place] [phone] Phone number for both [place] and [place] locations is the same.  [facility] [address] [place] [phone]   [facility] [address], [place] or [address] [phone]   [facility] [address], [place] [phone], phone; [phone], fax  Sees patients 1st and 3rd Saturday of every month.  Must not qualify for public or private insurance (i.e. Medicaid, Medicare, [place] Health Choice, Veterans' Benefits)  Household income should be no more than 200% of the poverty level The clinic cannot treat you if you are pregnant or think you are pregnant  Sexually transmitted diseases are not treated at the clinic.    Dental Care: Organization         Address  Phone  Notes  [facility] Department of [facility] [place] [phone] Accepts children up to age 54 who are enrolled in [place] or [place]; pregnant women with a Medicaid card; and children who have applied for Medicaid or [place] Health Choice, but were declined, whose parents can pay a reduced fee at time of service.  [facility] Department of [facility]  [address], [place] [phone] Accepts children up to age 44 who are enrolled in [place] or [place]; pregnant women with a Medicaid card; and children who have applied for Medicaid or [place] Health Choice, but were declined, whose parents can pay a reduced fee at time of service.  [place] Adult Dental Access PROGRAM  [place] [phone] Patients are seen by appointment only. Walk-ins are not accepted. [place] will see patients 35 years of age and older. Monday - Tuesday (8am-5pm) Most Wednesdays (8:30-5pm) $30 per visit, cash only  [facility] Adult Dental Access PROGRAM  [address], [facility] [phone] Patients are seen by appointment only. Walk-ins are not accepted. [place] will see patients 49 years of age and older. One Wednesday Evening (Monthly: Volunteer Based).  $30 per visit, cash only  [place]  [phone] for adults; Children under age 68, call Graduate Pediatric Dentistry at [phone]. Children aged 89-14, please call [phone] to  request a pediatric application.  Dental services are provided in all areas of dental care including fillings, crowns and bridges, complete and partial dentures, implants, gum treatment, root canals, and extractions. Preventive care is also provided. Treatment is provided to both adults and children. Patients are selected via a lottery and there is often a waiting list.   [facility] [address] [place]  [phone] [email or web address]  [address], [place], [place] [phone], [place] Thursday of each month, opens at 6:30 AM; Clinic ends at 9 AM.  Patients are seen on a first-come first-served basis, and a limited number are seen during each clinic.   [facility]  [address] Hillard Danker [place], [place] [phone]   Eligibility Requirements You must have lived in [place], [place], or [place] counties for at least the last three months.   You cannot be eligible for state or federal sponsored [employer], including [employer], [place], or [employer].   You generally cannot be eligible for healthcare insurance through your employer.    How to apply: Eligibility screenings are held every Tuesday and Wednesday afternoon from 1:00 pm until 4:00 pm. You do not need an appointment for the interview!  [facility] [address], [place], [place]   [place]  [place] Department  [place]  [phone]    Behavioral Health Resources in the Community: Intensive Outpatient Programs Organization         Address  Phone  Notes  [place]. [address], [place], [place] [phone]   [facility] Outpatient [address] [place], [place]   ADS: Alcohol & Drug Svcs [address] [place], [place]   [place] [address],  [place], [place] or [phone]   Substance Abuse Resources Organization         Address  Phone  Notes  Alcohol and Drug Services  [phone]   [place]  [phone]   [facility] [place]   Chinita Pester  [phone]   Residential & Outpatient Substance Abuse Program  [phone]   Psychological Services Organization         Address  Phone  Notes  [facility] [place]  [place]  [phone]   [place] [address], [place] or [phone]    Mobile Crisis Teams Organization         Address  Phone  Notes  [facility], Mobile Crisis Care Unit  [phone]   [facility]  [address] [place], [place]   [place] [address] [place] [phone]    Self-Help/Support Groups Organization         Address  Phone             Notes  [place]. of [place] - variety of support groups  [place] Call for more information  Narcotics Anonymous (NA), [facility] [address], [employer] [place]  2 meetings at this location   Special educational needs teacher         Address  Phone  Notes  ASAP Residential Treatment [place],    [place]  [phone]   [facility]  [address], [place] [address], [place], [place]   [place], [place] [phone] Admissions: 8am-3pm M-F  Incentives Substance [place] [address],    [place], [place] [phone]   [facility] [address] Jadene Pierini [place], [place]   [facility] [address]  [place], [place] - Intensive Outpatient [address] Kristeen Mans [place], [place]  [phone]   [facility] ([place].,  [place], [place] [phone] or [phone]   [facility] (RTS) [address] [place], [place] Accepts Medicaid  [address]  [place] [phone] Substance Abuse/Addiction Treatment   [facility] Organization         Address  Phone  Notes  [facility]  [phone]   Domenic Schwab, PhD [address] Arlis [place], [place]   [phone] or [phone]   [place], [place] [phone]   [address], [place], [place] [phone] Insurance/Medicaid/sponsorship through [facility] and Families [address] [place]                                    [place], [place] [phone] [place] [address], [place] [phone]    Dr. Adele Schilder  [phone]   [facility] Dept. 1) [address], [place] 2) [place] 3)  [place] [address], [place] [phone]  [phone]   [place] [phone] or [phone] (After Hours)

## 2014-10-06 NOTE — ED Notes (Signed)
Dressing applied to R ring finger. Remaining silvadene given to pt.
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
## 2014-10-09 ENCOUNTER — Emergency Department (HOSPITAL_COMMUNITY)
Admission: EM | Admit: 2014-10-09 | Discharge: 2014-10-09 | Disposition: A | Discharge status: Home / Self Care | Payer: No Typology Code available for payment source | Attending: Emergency Medicine | Admitting: Emergency Medicine

## 2014-10-09 ENCOUNTER — Encounter (HOSPITAL_COMMUNITY): Payer: Self-pay | Admitting: Emergency Medicine

## 2014-10-09 DIAGNOSIS — Z87828 Personal history of other (healed) physical injury and trauma: Secondary | ICD-10-CM | POA: Insufficient documentation

## 2014-10-09 DIAGNOSIS — Z4800 Encounter for change or removal of nonsurgical wound dressing: Secondary | ICD-10-CM | POA: Insufficient documentation

## 2014-10-09 DIAGNOSIS — Z79899 Other long term (current) drug therapy: Secondary | ICD-10-CM | POA: Insufficient documentation

## 2014-10-09 DIAGNOSIS — Z09 Encounter for follow-up examination after completed treatment for conditions other than malignant neoplasm: Secondary | ICD-10-CM

## 2014-10-09 DIAGNOSIS — Q249 Congenital malformation of heart, unspecified: Secondary | ICD-10-CM | POA: Insufficient documentation

## 2014-10-09 DIAGNOSIS — Z720 Tobacco use: Secondary | ICD-10-CM | POA: Insufficient documentation

## 2014-10-09 DIAGNOSIS — I10 Essential (primary) hypertension: Secondary | ICD-10-CM | POA: Insufficient documentation

## 2014-10-09 DIAGNOSIS — I2510 Atherosclerotic heart disease of native coronary artery without angina pectoris: Secondary | ICD-10-CM | POA: Insufficient documentation

## 2014-10-09 MED ORDER — OXYCODONE-ACETAMINOPHEN 5-325 MG PO TABS: 2.0000 | ORAL_TABLET | ORAL | Status: DC | PRN

## 2014-10-09 NOTE — Discharge Instructions (Signed)
Burn Care Your skin is a natural barrier to infection. It is the largest organ of your body. Burns damage this natural protection. To help prevent infection, it is very important to follow your caregiver's instructions in the care of your burn. Burns are classified as:  First degree. There is only redness of the skin (erythema). No scarring is expected.  Second degree. There is blistering of the skin. Scarring may occur with deeper burns.  Third degree. All layers of the skin are injured, and scarring is expected. HOME CARE INSTRUCTIONS   Wash your hands well before changing your bandage.  Change your bandage as often as directed by your caregiver.  Remove the old bandage. If the bandage sticks, you may soak it off with cool, clean water.  Cleanse the burn thoroughly but gently with mild soap and water.  Pat the area dry with a clean, dry cloth.  Apply a thin layer of antibacterial cream to the burn.  Apply a clean bandage as instructed by your caregiver.  Keep the bandage as clean and dry as possible.  Elevate the affected area for the first 24 hours, then as instructed by your caregiver.  Only take over-the-counter or prescription medicines for pain, discomfort, or fever as directed by your caregiver. SEEK IMMEDIATE MEDICAL CARE IF:   You develop excessive pain.  You develop redness, tenderness, swelling, or red streaks near the burn.  The burned area develops yellowish-white fluid (pus) or a bad smell.  You have a fever. MAKE SURE YOU:   Understand these instructions.  Will watch your condition.  Will get help right away if you are not doing well or get worse. Document Released: 09/07/2005 Document Revised: 11/30/2011 Document Reviewed: 01/28/2011 [employer] Patient Information 2015 [employer]. This information is not intended to replace advice given to you by your health care provider. Make sure you discuss any questions you have with your health care  provider.  
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
## 2014-10-09 NOTE — ED Notes (Addendum)
Finger wrapped with sterile gauze and secured with tape. Patient with no complaints at this time. Respirations even and unlabored. Skin warm/dry. Discharge instructions reviewed with patient at this time. Patient given opportunity to voice concerns/ask questions.  Patient discharged at this time and left Emergency Department with steady gait.

## 2014-10-15 ENCOUNTER — Emergency Department: Payer: Self-pay | Admitting: Emergency Medicine

## 2015-03-13 ENCOUNTER — Other Ambulatory Visit: Payer: Self-pay

## 2015-03-13 ENCOUNTER — Emergency Department: Payer: Self-pay

## 2015-03-13 ENCOUNTER — Encounter: Payer: Self-pay | Admitting: Emergency Medicine

## 2015-03-13 ENCOUNTER — Emergency Department
Admission: EM | Admit: 2015-03-13 | Discharge: 2015-03-13 | Disposition: A | Discharge status: Home / Self Care | Payer: Self-pay | Attending: Emergency Medicine | Admitting: Emergency Medicine

## 2015-03-13 DIAGNOSIS — Z720 Tobacco use: Secondary | ICD-10-CM | POA: Insufficient documentation

## 2015-03-13 DIAGNOSIS — Z79899 Other long term (current) drug therapy: Secondary | ICD-10-CM | POA: Insufficient documentation

## 2015-03-13 DIAGNOSIS — I10 Essential (primary) hypertension: Secondary | ICD-10-CM | POA: Insufficient documentation

## 2015-03-13 DIAGNOSIS — R51 Headache: Secondary | ICD-10-CM | POA: Insufficient documentation

## 2015-03-13 LAB — BASIC METABOLIC PANEL
Anion gap: 13 (ref 5–15)
BUN: 7 mg/dL (ref 6–20)
CHLORIDE: 99 mmol/L — AB (ref 101–111)
CO2: 28 mmol/L (ref 22–32)
CREATININE: 1.02 mg/dL (ref 0.61–1.24)
Calcium: 9.6 mg/dL (ref 8.9–10.3)
GFR calc Af Amer: >60 mL/min (ref >60)
GFR calc non Af Amer: >60 mL/min (ref >60)
GLUCOSE: 114 mg/dL — AB (ref 65–99)
Potassium: 3.4 mmol/L — ABNORMAL LOW (ref 3.5–5.1)
Sodium: 140 mmol/L (ref 135–145)

## 2015-03-13 LAB — TROPONIN I

## 2015-03-13 LAB — CBC
HEMATOCRIT: 48.8 % (ref 40.0–52.0)
Hemoglobin: 16.8 g/dL (ref 13.0–18.0)
MCH: 33.7 pg (ref 26.0–34.0)
MCHC: 34.4 g/dL (ref 32.0–36.0)
MCV: 97.9 fL (ref 80.0–100.0)
Platelets: 222 10*3/uL (ref 150–440)
RBC: 4.99 MIL/uL (ref 4.40–5.90)
RDW: 13.2 % (ref 11.5–14.5)
WBC: 10.2 10*3/uL (ref 3.8–10.6)

## 2015-03-13 MED ORDER — ASPIRIN 81 MG PO CHEW
CHEWABLE_TABLET | ORAL | Status: AC
Start: 2015-03-13 — End: 2015-03-13
  Administered 2015-03-13: 324 mg via ORAL
  Filled 2015-03-13: qty 4

## 2015-03-13 MED ORDER — METOPROLOL TARTRATE 50 MG PO TABS
50.0000 mg | ORAL_TABLET | Freq: Once | ORAL | Status: AC
  Administered 2015-03-13: 50 mg via ORAL

## 2015-03-13 MED ORDER — ACETAMINOPHEN 500 MG PO TABS
1000.0000 mg | ORAL_TABLET | Freq: Once | ORAL | Status: AC
  Administered 2015-03-13: 1000 mg via ORAL

## 2015-03-13 MED ORDER — ASPIRIN 81 MG PO CHEW
324.0000 mg | CHEWABLE_TABLET | Freq: Once | ORAL | Status: AC
  Administered 2015-03-13: 324 mg via ORAL

## 2015-03-13 MED ORDER — METOPROLOL TARTRATE 50 MG PO TABS
ORAL_TABLET | ORAL | Status: AC
Start: 2015-03-13 — End: 2015-03-13
  Administered 2015-03-13: 50 mg via ORAL
  Filled 2015-03-13: qty 1

## 2015-03-13 MED ORDER — ACETAMINOPHEN 500 MG PO TABS
ORAL_TABLET | ORAL | Status: AC
  Filled 2015-03-13: qty 2

## 2015-03-13 MED ORDER — METOPROLOL TARTRATE 50 MG PO TABS: 50.0000 mg | ORAL_TABLET | Freq: Two times a day (BID) | ORAL | Status: DC

## 2015-03-13 NOTE — Discharge Instructions (Signed)
As we discussed, though you do have high blood pressure (hypertension), fortunately it is not immediately dangerous at this time and does not need emergency intervention or admission to the hospital.  Please take the prescribed medication and use over-the-counter Tylenol and ibuprofen as needed for pain relief .  Please follow up in clinic as recommended in these papers.  Return to the Emergency Department (ED) if you experience any chest pain/pressure/tightness, difficulty breathing, or sudden sweating, or other symptoms that concern you.   Hypertension Hypertension, commonly called high blood pressure, is when the force of blood pumping through your arteries is too strong. Your arteries are the blood vessels that carry blood from your heart throughout your body. A blood pressure reading consists of a higher number over a lower number, such as 110/72. The higher number (systolic) is the pressure inside your arteries when your heart pumps. The lower number (diastolic) is the pressure inside your arteries when your heart relaxes. Ideally you want your blood pressure below 120/80. Hypertension forces your heart to work harder to pump blood. Your arteries may become narrow or stiff. Having hypertension puts you at risk for heart disease, stroke, and other problems.  RISK FACTORS Some risk factors for high blood pressure are controllable. Others are not.  Risk factors you cannot control include:   Race. You may be at higher risk if you are African American.  Age. Risk increases with age.  Gender. Men are at higher risk than women before age 69 years. After age 21, women are at higher risk than men. Risk factors you can control include:  Not getting enough exercise or physical activity.  Being overweight.  Getting too much fat, sugar, calories, or salt in your diet.  Drinking too much alcohol. SIGNS AND SYMPTOMS Hypertension does not usually cause signs or symptoms. Extremely high blood  pressure (hypertensive crisis) may cause headache, anxiety, shortness of breath, and nosebleed. DIAGNOSIS  To check if you have hypertension, your health care provider will measure your blood pressure while you are seated, with your arm held at the level of your heart. It should be measured at least twice using the same arm. Certain conditions can cause a difference in blood pressure between your right and left arms. A blood pressure reading that is higher than normal on one occasion does not mean that you need treatment. If one blood pressure reading is high, ask your health care provider about having it checked again. TREATMENT  Treating high blood pressure includes making lifestyle changes and possibly taking medicine. Living a healthy lifestyle can help lower high blood pressure. You may need to change some of your habits. Lifestyle changes may include:  Following the DASH diet. This diet is high in fruits, vegetables, and whole grains. It is low in salt, red meat, and added sugars.  Getting at least 2 hours of brisk physical activity every week.  Losing weight if necessary.  Not smoking.  Limiting alcoholic beverages.  Learning ways to reduce stress. If lifestyle changes are not enough to get your blood pressure under control, your health care provider may prescribe medicine. You may need to take more than one. Work closely with your health care provider to understand the risks and benefits. HOME CARE INSTRUCTIONS  Have your blood pressure rechecked as directed by your health care provider.   Take medicines only as directed by your health care provider. Follow the directions carefully. Blood pressure medicines must be taken as prescribed. The medicine does not work  as well when you skip doses. Skipping doses also puts you at risk for problems.   Do not smoke.   Monitor your blood pressure at home as directed by your health care provider. SEEK MEDICAL CARE IF:   You think you  are having a reaction to medicines taken.  You have recurrent headaches or feel dizzy.  You have swelling in your ankles.  You have trouble with your vision. SEEK IMMEDIATE MEDICAL CARE IF:  You develop a severe headache or confusion.  You have unusual weakness, numbness, or feel faint.  You have severe chest or abdominal pain.  You vomit repeatedly.  You have trouble breathing. MAKE SURE YOU:   Understand these instructions.  Will watch your condition.  Will get help right away if you are not doing well or get worse. Document Released: 09/07/2005 Document Revised: 01/22/2014 Document Reviewed: 06/30/2013 [facility] Patient Information 2015 [place], [place]. This information is not intended to replace advice given to you by your health care provider. Make sure you discuss any questions you have with your health care provider.

## 2015-03-13 NOTE — ED Notes (Signed)
Patient c/o sharp, stabbing chest pain since 11. Patient also c/o headache. C/o tingling to face and arms. Hx HTN; noncompliant with meds.
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
## 2015-05-20 ENCOUNTER — Emergency Department (HOSPITAL_COMMUNITY)
Admission: EM | Admit: 2015-05-20 | Discharge: 2015-05-20 | Disposition: A | Discharge status: Home / Self Care | Payer: Self-pay | Attending: Emergency Medicine | Admitting: Emergency Medicine

## 2015-05-20 ENCOUNTER — Emergency Department (HOSPITAL_COMMUNITY): Payer: Self-pay

## 2015-05-20 ENCOUNTER — Encounter (HOSPITAL_COMMUNITY): Payer: Self-pay | Admitting: Emergency Medicine

## 2015-05-20 DIAGNOSIS — I2510 Atherosclerotic heart disease of native coronary artery without angina pectoris: Secondary | ICD-10-CM | POA: Insufficient documentation

## 2015-05-20 DIAGNOSIS — Z9889 Other specified postprocedural states: Secondary | ICD-10-CM | POA: Insufficient documentation

## 2015-05-20 DIAGNOSIS — Q249 Congenital malformation of heart, unspecified: Secondary | ICD-10-CM | POA: Insufficient documentation

## 2015-05-20 DIAGNOSIS — W540XXA Bitten by dog, initial encounter: Secondary | ICD-10-CM | POA: Insufficient documentation

## 2015-05-20 DIAGNOSIS — Y9389 Activity, other specified: Secondary | ICD-10-CM | POA: Insufficient documentation

## 2015-05-20 DIAGNOSIS — Z791 Long term (current) use of non-steroidal anti-inflammatories (NSAID): Secondary | ICD-10-CM | POA: Insufficient documentation

## 2015-05-20 DIAGNOSIS — Z79899 Other long term (current) drug therapy: Secondary | ICD-10-CM | POA: Insufficient documentation

## 2015-05-20 DIAGNOSIS — I10 Essential (primary) hypertension: Secondary | ICD-10-CM | POA: Insufficient documentation

## 2015-05-20 DIAGNOSIS — S71152A Open bite, left thigh, initial encounter: Secondary | ICD-10-CM | POA: Insufficient documentation

## 2015-05-20 DIAGNOSIS — Y9289 Other specified places as the place of occurrence of the external cause: Secondary | ICD-10-CM | POA: Insufficient documentation

## 2015-05-20 DIAGNOSIS — Y998 Other external cause status: Secondary | ICD-10-CM | POA: Insufficient documentation

## 2015-05-20 DIAGNOSIS — T1490XA Injury, unspecified, initial encounter: Secondary | ICD-10-CM

## 2015-05-20 DIAGNOSIS — T148XXA Other injury of unspecified body region, initial encounter: Secondary | ICD-10-CM

## 2015-05-20 DIAGNOSIS — S6991XA Unspecified injury of right wrist, hand and finger(s), initial encounter: Secondary | ICD-10-CM

## 2015-05-20 DIAGNOSIS — Z720 Tobacco use: Secondary | ICD-10-CM | POA: Insufficient documentation

## 2015-05-20 DIAGNOSIS — S71151A Open bite, right thigh, initial encounter: Secondary | ICD-10-CM | POA: Insufficient documentation

## 2015-05-20 DIAGNOSIS — S61151A Open bite of right thumb with damage to nail, initial encounter: Secondary | ICD-10-CM | POA: Insufficient documentation

## 2015-05-20 MED ORDER — HYDROMORPHONE HCL 2 MG/ML IJ SOLN
2.0000 mg | Freq: Once | INTRAMUSCULAR | Status: AC
  Administered 2015-05-20: 2 mg via INTRAVENOUS
  Filled 2015-05-20: qty 1

## 2015-05-20 MED ORDER — HYDROMORPHONE HCL 1 MG/ML IJ SOLN
1.0000 mg | Freq: Once | INTRAMUSCULAR | Status: AC
  Administered 2015-05-20: 1 mg via INTRAVENOUS
  Filled 2015-05-20: qty 1

## 2015-05-20 MED ORDER — SODIUM CHLORIDE 0.9 % IV SOLN
3.0000 g | Freq: Once | INTRAVENOUS | Status: AC
  Administered 2015-05-20: 3 g via INTRAVENOUS
  Filled 2015-05-20: qty 3

## 2015-05-20 MED ORDER — AMOXICILLIN-POT CLAVULANATE 875-125 MG PO TABS: 1.0000 | ORAL_TABLET | Freq: Two times a day (BID) | ORAL | Status: DC

## 2015-05-20 MED ORDER — OXYCODONE-ACETAMINOPHEN 7.5-325 MG PO TABS
1.0000 | ORAL_TABLET | ORAL | Status: DC | PRN
Start: 2015-05-20 — End: 2016-06-11

## 2015-05-20 MED ORDER — LIDOCAINE HCL 2 % IJ SOLN
10.0000 mL | Freq: Once | INTRAMUSCULAR | Status: AC
  Administered 2015-05-20: 200 mg
  Filled 2015-05-20: qty 20

## 2015-05-20 NOTE — ED Notes (Signed)
Patient transported to X-ray 

## 2015-05-20 NOTE — Consult Note (Signed)
  Have spoken to Dr Zenia Resides regarding right thumb nail injury   Will place sterile dressing and see in my office this pm for treatment

## 2015-05-20 NOTE — ED Notes (Signed)
MD at bedside. 

## 2015-05-20 NOTE — ED Notes (Signed)
Hand called for Dr Zenia Resides
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
## 2015-05-20 NOTE — Discharge Instructions (Signed)

## 2015-05-20 NOTE — ED Notes (Signed)
Bed: WA06 Expected date:  Expected time:  Means of arrival:  Comments: Multiple dog bites-ems

## 2015-05-20 NOTE — ED Notes (Signed)
Pt was attacked by his dog while walking to his vehicle at his home this am. Pt reports dog has had his rabies shots. Animal control notified. Pt has wounds to bilateral thighs and R thumb. Also complaining of L hip pain. Pt was ambulatory after incident. Pt was given 236mcg fentanyl by EMS. Last tetanus shot 7 years ago.

## 2015-05-26 DIAGNOSIS — F3160 Bipolar disorder, current episode mixed, unspecified: Secondary | ICD-10-CM | POA: Insufficient documentation

## 2015-05-26 DIAGNOSIS — F3130 Bipolar disorder, current episode depressed, mild or moderate severity, unspecified: Secondary | ICD-10-CM | POA: Insufficient documentation

## 2015-05-26 DIAGNOSIS — I10 Essential (primary) hypertension: Secondary | ICD-10-CM | POA: Insufficient documentation

## 2015-05-26 DIAGNOSIS — R45851 Suicidal ideations: Secondary | ICD-10-CM | POA: Insufficient documentation

## 2015-06-17 ENCOUNTER — Emergency Department (HOSPITAL_COMMUNITY)
Admission: EM | Admit: 2015-06-17 | Discharge: 2015-06-17 | Disposition: A | Discharge status: Home / Self Care | Payer: Self-pay | Attending: Emergency Medicine | Admitting: Emergency Medicine

## 2015-06-17 ENCOUNTER — Encounter (HOSPITAL_COMMUNITY): Payer: Self-pay | Admitting: Emergency Medicine

## 2015-06-17 DIAGNOSIS — Z79899 Other long term (current) drug therapy: Secondary | ICD-10-CM | POA: Insufficient documentation

## 2015-06-17 DIAGNOSIS — Z720 Tobacco use: Secondary | ICD-10-CM | POA: Insufficient documentation

## 2015-06-17 DIAGNOSIS — Z5189 Encounter for other specified aftercare: Secondary | ICD-10-CM

## 2015-06-17 DIAGNOSIS — Z791 Long term (current) use of non-steroidal anti-inflammatories (NSAID): Secondary | ICD-10-CM | POA: Insufficient documentation

## 2015-06-17 DIAGNOSIS — I2510 Atherosclerotic heart disease of native coronary artery without angina pectoris: Secondary | ICD-10-CM | POA: Insufficient documentation

## 2015-06-17 DIAGNOSIS — Z4800 Encounter for change or removal of nonsurgical wound dressing: Secondary | ICD-10-CM | POA: Insufficient documentation

## 2015-06-17 DIAGNOSIS — Z792 Long term (current) use of antibiotics: Secondary | ICD-10-CM | POA: Insufficient documentation

## 2015-06-17 DIAGNOSIS — I10 Essential (primary) hypertension: Secondary | ICD-10-CM | POA: Insufficient documentation

## 2015-06-17 DIAGNOSIS — Q249 Congenital malformation of heart, unspecified: Secondary | ICD-10-CM | POA: Insufficient documentation

## 2015-06-17 MED ORDER — NAPROXEN 500 MG PO TABS: 500.0000 mg | ORAL_TABLET | Freq: Two times a day (BID) | ORAL | Status: DC

## 2015-06-17 MED ORDER — KETOROLAC TROMETHAMINE 60 MG/2ML IM SOLN
60.0000 mg | Freq: Once | INTRAMUSCULAR | Status: AC
  Administered 2015-06-17: 60 mg via INTRAMUSCULAR
  Filled 2015-06-17: qty 2

## 2015-06-17 NOTE — Discharge Instructions (Signed)
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
## 2015-06-17 NOTE — ED Notes (Signed)
Bed: LY65 Expected date:  Expected time:  Means of arrival:  Comments: Tr2

## 2015-06-17 NOTE — ED Notes (Addendum)
Patient reports being bitten by a dog about 1 month ago. Patient was seen here and treated for the injury. Patient was then treated at [facility] for mental health and while there his leg became infection. The wound is now open, being packed twice daily at home by his fiance. Patient was advised to follow up at [facility] but can not afford transportation to their facility. Patient states he is now having uncontrollable pain. Patient was taking 10mg  Oxycodone (from [facility]) 4 times daily and 1 with each dressing change (total 6 pills daily). Patient states he is out of pain medication for past two days. Patient is on antibiotics (Clindamycin) written by [facility]. Patient fiance states this am there was an increased amount of pus-like drainage that is different than it has been.

## 2015-11-09 ENCOUNTER — Encounter (HOSPITAL_COMMUNITY): Payer: Self-pay

## 2015-11-09 DIAGNOSIS — Z791 Long term (current) use of non-steroidal anti-inflammatories (NSAID): Secondary | ICD-10-CM | POA: Insufficient documentation

## 2015-11-09 DIAGNOSIS — R112 Nausea with vomiting, unspecified: Secondary | ICD-10-CM | POA: Insufficient documentation

## 2015-11-09 DIAGNOSIS — R079 Chest pain, unspecified: Secondary | ICD-10-CM | POA: Insufficient documentation

## 2015-11-09 DIAGNOSIS — Z79899 Other long term (current) drug therapy: Secondary | ICD-10-CM | POA: Insufficient documentation

## 2015-11-09 DIAGNOSIS — Z9889 Other specified postprocedural states: Secondary | ICD-10-CM | POA: Insufficient documentation

## 2015-11-09 DIAGNOSIS — I10 Essential (primary) hypertension: Secondary | ICD-10-CM | POA: Insufficient documentation

## 2015-11-09 DIAGNOSIS — Z87828 Personal history of other (healed) physical injury and trauma: Secondary | ICD-10-CM | POA: Insufficient documentation

## 2015-11-09 DIAGNOSIS — I2510 Atherosclerotic heart disease of native coronary artery without angina pectoris: Secondary | ICD-10-CM | POA: Insufficient documentation

## 2015-11-09 DIAGNOSIS — Z9089 Acquired absence of other organs: Secondary | ICD-10-CM | POA: Insufficient documentation

## 2015-11-09 DIAGNOSIS — F17210 Nicotine dependence, cigarettes, uncomplicated: Secondary | ICD-10-CM | POA: Insufficient documentation

## 2015-11-09 DIAGNOSIS — Q249 Congenital malformation of heart, unspecified: Secondary | ICD-10-CM | POA: Insufficient documentation

## 2015-11-09 DIAGNOSIS — R509 Fever, unspecified: Secondary | ICD-10-CM | POA: Insufficient documentation

## 2015-11-09 DIAGNOSIS — R1013 Epigastric pain: Secondary | ICD-10-CM | POA: Insufficient documentation

## 2015-11-09 DIAGNOSIS — Z792 Long term (current) use of antibiotics: Secondary | ICD-10-CM | POA: Insufficient documentation

## 2015-11-09 DIAGNOSIS — M549 Dorsalgia, unspecified: Secondary | ICD-10-CM | POA: Insufficient documentation

## 2015-11-09 NOTE — ED Notes (Signed)
Pt here for headcold and also reports vomting on and off for three months, sts zofran givne by wife is not helping at all. Pt reports back pain as well. sts the vomting is sudden onset when it happens, per wife "it cant be viral"

## 2015-11-10 ENCOUNTER — Emergency Department (HOSPITAL_COMMUNITY)
Admission: EM | Admit: 2015-11-10 | Discharge: 2015-11-10 | Disposition: A | Discharge status: Home / Self Care | Payer: Self-pay | Attending: Emergency Medicine | Admitting: Emergency Medicine

## 2015-11-10 ENCOUNTER — Emergency Department (HOSPITAL_COMMUNITY): Payer: Self-pay

## 2015-11-10 DIAGNOSIS — R112 Nausea with vomiting, unspecified: Secondary | ICD-10-CM

## 2015-11-10 DIAGNOSIS — R1013 Epigastric pain: Secondary | ICD-10-CM

## 2015-11-10 LAB — URINALYSIS, ROUTINE W REFLEX MICROSCOPIC
Bilirubin Urine: NEGATIVE
Glucose, UA: NEGATIVE mg/dL
Hgb urine dipstick: NEGATIVE
KETONES UR: NEGATIVE mg/dL
Leukocytes, UA: NEGATIVE
NITRITE: NEGATIVE
PH: 6.5 (ref 5.0–8.0)
Protein, ur: NEGATIVE mg/dL
Specific Gravity, Urine: 1.021 (ref 1.005–1.030)

## 2015-11-10 LAB — CBC
HEMATOCRIT: 42.7 % (ref 39.0–52.0)
Hemoglobin: 15 g/dL (ref 13.0–17.0)
MCH: 33 pg (ref 26.0–34.0)
MCHC: 35.1 g/dL (ref 30.0–36.0)
MCV: 94.1 fL (ref 78.0–100.0)
PLATELETS: 182 10*3/uL (ref 150–400)
RBC: 4.54 MIL/uL (ref 4.22–5.81)
RDW: 12.9 % (ref 11.5–15.5)
WBC: 7.3 10*3/uL (ref 4.0–10.5)

## 2015-11-10 LAB — COMPREHENSIVE METABOLIC PANEL
ALBUMIN: 4.2 g/dL (ref 3.5–5.0)
ALT: 24 U/L (ref 17–63)
AST: 24 U/L (ref 15–41)
Alkaline Phosphatase: 57 U/L (ref 38–126)
Anion gap: 10 (ref 5–15)
BILIRUBIN TOTAL: 0.7 mg/dL (ref 0.3–1.2)
BUN: 8 mg/dL (ref 6–20)
CHLORIDE: 104 mmol/L (ref 101–111)
CO2: 27 mmol/L (ref 22–32)
CREATININE: 1.06 mg/dL (ref 0.61–1.24)
Calcium: 8.9 mg/dL (ref 8.9–10.3)
GFR calc Af Amer: >60 mL/min (ref >60)
GLUCOSE: 104 mg/dL — AB (ref 65–99)
Potassium: 3.5 mmol/L (ref 3.5–5.1)
Sodium: 141 mmol/L (ref 135–145)
Total Protein: 6.7 g/dL (ref 6.5–8.1)

## 2015-11-10 LAB — LIPASE, BLOOD: Lipase: 31 U/L (ref 11–51)

## 2015-11-10 MED ORDER — ONDANSETRON 4 MG PO TBDP: 4.0000 mg | ORAL_TABLET | Freq: Three times a day (TID) | ORAL | Status: DC | PRN

## 2015-11-10 MED ORDER — PANTOPRAZOLE SODIUM 40 MG IV SOLR
40.0000 mg | Freq: Once | INTRAVENOUS | Status: AC
  Administered 2015-11-10: 40 mg via INTRAVENOUS
  Filled 2015-11-10: qty 40

## 2015-11-10 MED ORDER — PANTOPRAZOLE SODIUM 40 MG IV SOLR
40.0000 mg | Freq: Once | INTRAVENOUS | Status: DC
  Filled 2015-11-10: qty 40

## 2015-11-10 MED ORDER — ONDANSETRON HCL 4 MG/2ML IJ SOLN
4.0000 mg | Freq: Once | INTRAMUSCULAR | Status: AC
  Administered 2015-11-10: 4 mg via INTRAVENOUS
  Filled 2015-11-10: qty 2

## 2015-11-10 MED ORDER — GI COCKTAIL ~~LOC~~
30.0000 mL | Freq: Once | ORAL | Status: AC
  Administered 2015-11-10: 30 mL via ORAL
  Filled 2015-11-10: qty 30

## 2015-11-10 MED ORDER — OMEPRAZOLE 20 MG PO CPDR: 20.0000 mg | DELAYED_RELEASE_CAPSULE | Freq: Every day | ORAL | Status: DC

## 2015-11-10 MED ORDER — SODIUM CHLORIDE 0.9 % IV BOLUS (SEPSIS)
1000.0000 mL | Freq: Once | INTRAVENOUS | Status: AC
  Administered 2015-11-10: 1000 mL via INTRAVENOUS
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
## 2015-11-10 NOTE — ED Notes (Signed)
Requested urine sample from pt again

## 2015-11-10 NOTE — ED Notes (Signed)
EDP at bedside  

## 2015-11-10 NOTE — Discharge Instructions (Signed)
Peptic Ulcer ° °A peptic ulcer is a sore in the lining of your esophagus (esophageal ulcer), stomach (gastric ulcer), or in the first part of your small intestine (duodenal ulcer). The ulcer causes erosion into the deeper tissue. °CAUSES  °Normally, the lining of the stomach and the small intestine protects itself from the acid that digests food. The protective lining can be damaged by: °· An infection caused by a bacterium called Helicobacter pylori (H. pylori). °· Regular use of nonsteroidal anti-inflammatory drugs (NSAIDs), such as ibuprofen or aspirin. °· Smoking tobacco. °Other risk factors include being older than 50, drinking alcohol excessively, and having a family history of ulcer disease.  °SYMPTOMS  °· Burning pain or gnawing in the area between the chest and the belly button. °· Heartburn. °· Nausea and vomiting. °· Bloating. °The pain can be worse on an empty stomach and at night. If the ulcer results in bleeding, it can cause: °· Black, tarry stools. °· Vomiting of bright red blood. °· Vomiting of coffee-ground-looking materials. °DIAGNOSIS  °A diagnosis is usually made based upon your history and an exam. Other tests and procedures may be performed to find the cause of the ulcer. Finding a cause will help determine the best treatment. Tests and procedures may include: °· Blood tests, stool tests, or breath tests to check for the bacterium H. pylori. °· An upper gastrointestinal (GI) series of the esophagus, stomach, and small intestine. °· An endoscopy to examine the esophagus, stomach, and small intestine. °· A biopsy. °TREATMENT  °Treatment may include: °· Eliminating the cause of the ulcer, such as smoking, NSAIDs, or alcohol. °· Medicines to reduce the amount of acid in your digestive tract. °· Antibiotic medicines if the ulcer is caused by the H. pylori bacterium. °· An upper endoscopy to treat a bleeding ulcer. °· Surgery if the bleeding is severe or if the ulcer created a hole somewhere in the  digestive system. °HOME CARE INSTRUCTIONS  °· Avoid tobacco, alcohol, and caffeine. Smoking can increase the acid in the stomach, and continued smoking will impair the healing of ulcers. °· Avoid foods and drinks that seem to cause discomfort or aggravate your ulcer. °· Only take medicines as directed by your caregiver. Do not substitute over-the-counter medicines for prescription medicines without talking to your caregiver. °· Keep any follow-up appointments and tests as directed. °SEEK MEDICAL CARE IF:  °· Your do not improve within 7 days of starting treatment. °· You have ongoing indigestion or heartburn. °SEEK IMMEDIATE MEDICAL CARE IF:  °· You have sudden, sharp, or persistent abdominal pain. °· You have bloody or dark black, tarry stools. °· You vomit blood or vomit that looks like coffee grounds. °· You become light-headed, weak, or feel faint. °· You become sweaty or clammy. °MAKE SURE YOU:  °· Understand these instructions. °· Will watch your condition. °· Will get help right away if you are not doing well or get worse. °  °This information is not intended to replace advice given to you by your health care provider. Make sure you discuss any questions you have with your health care provider. °  °Document Released: 09/04/2000 Document Revised: 09/28/2014 Document Reviewed: 04/06/2012 °Elsevier Interactive Patient Education ©2016 [employer] ° °

## 2015-11-10 NOTE — ED Notes (Signed)
Requested urine sample from pt ? ?

## 2015-11-10 NOTE — ED Notes (Signed)
Sprite given for PO challenge. 

## 2016-06-11 ENCOUNTER — Emergency Department (HOSPITAL_COMMUNITY): Payer: Self-pay

## 2016-06-11 ENCOUNTER — Encounter (HOSPITAL_COMMUNITY): Payer: Self-pay

## 2016-06-11 ENCOUNTER — Emergency Department (HOSPITAL_COMMUNITY)
Admission: EM | Admit: 2016-06-11 | Discharge: 2016-06-11 | Disposition: A | Discharge status: Home / Self Care | Payer: Self-pay | Attending: Emergency Medicine | Admitting: Emergency Medicine

## 2016-06-11 DIAGNOSIS — Y999 Unspecified external cause status: Secondary | ICD-10-CM | POA: Insufficient documentation

## 2016-06-11 DIAGNOSIS — F17210 Nicotine dependence, cigarettes, uncomplicated: Secondary | ICD-10-CM | POA: Insufficient documentation

## 2016-06-11 DIAGNOSIS — I2510 Atherosclerotic heart disease of native coronary artery without angina pectoris: Secondary | ICD-10-CM | POA: Insufficient documentation

## 2016-06-11 DIAGNOSIS — M25561 Pain in right knee: Secondary | ICD-10-CM | POA: Insufficient documentation

## 2016-06-11 DIAGNOSIS — X509XXA Other and unspecified overexertion or strenuous movements or postures, initial encounter: Secondary | ICD-10-CM | POA: Insufficient documentation

## 2016-06-11 DIAGNOSIS — Y92096 Garden or yard of other non-institutional residence as the place of occurrence of the external cause: Secondary | ICD-10-CM | POA: Insufficient documentation

## 2016-06-11 DIAGNOSIS — Y93H2 Activity, gardening and landscaping: Secondary | ICD-10-CM | POA: Insufficient documentation

## 2016-06-11 DIAGNOSIS — I10 Essential (primary) hypertension: Secondary | ICD-10-CM | POA: Insufficient documentation

## 2016-06-11 MED ORDER — METHOCARBAMOL 500 MG PO TABS: 500.0000 mg | ORAL_TABLET | Freq: Two times a day (BID) | ORAL | 0 refills | Status: DC | PRN

## 2016-06-11 MED ORDER — OXYCODONE-ACETAMINOPHEN 5-325 MG PO TABS
1.0000 | ORAL_TABLET | Freq: Once | ORAL | Status: AC
  Administered 2016-06-11: 1 via ORAL
  Filled 2016-06-11: qty 1

## 2016-06-11 MED ORDER — OXYCODONE-ACETAMINOPHEN 5-325 MG PO TABS: 1.0000 | ORAL_TABLET | Freq: Three times a day (TID) | ORAL | 0 refills | Status: DC | PRN

## 2016-06-11 MED ORDER — HYDROCODONE-ACETAMINOPHEN 5-325 MG PO TABS: 1.0000 | ORAL_TABLET | Freq: Four times a day (QID) | ORAL | 0 refills | Status: DC | PRN
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
## 2016-06-11 NOTE — ED Notes (Addendum)
Pt laying in bed with right knee bent slightly, stood to straighten his knee and knee 'popped' back into place. States he stepped in a hole yesterday, hyperextending his knee. Pt has hx of chronic knee problems with surgery.

## 2016-06-11 NOTE — Progress Notes (Signed)
Orthopedic Tech Progress Note Patient Details:  Jermaine [name] [birth year] [id number]  Ortho Devices Type of Ortho Device: Crutches, Knee Immobilizer Ortho Device/Splint Location: rle Ortho Device/Splint Interventions: Application   Idalee Foxworthy 06/11/2016, 1:01 PM

## 2016-06-11 NOTE — ED Notes (Signed)
[facility] 20 COUPON GIVEN

## 2016-06-11 NOTE — ED Triage Notes (Signed)
Pt here with right knee pain. He states he was mowing grass yesterday and states "I hyper-extended my knee." He states"it gave out today."

## 2016-06-11 NOTE — ED Notes (Signed)
PT STATES HE IS ALLERGIC TO HYDROCODONE. WAITING FOR PA TO RETURN TO REPORT ALLERGY.

## 2016-06-11 NOTE — ED Notes (Signed)
REPORTED ALLERGY TO PA

## 2016-06-11 NOTE — Discharge Instructions (Signed)
Use call the orthopedic physician today to schedule a follow-up appointment. Wear knee immobilizer until you are seen by the orthopedic physician. Take pain medication only as needed for severe pain. Muscle relaxer (Robaxin) as needed for muscle spasms and/or pain. In addition to these medications, please ice and elevate for additional pain relief. Return to ER for new or worsening symptoms, any additional concerns.  COLD THERAPY DIRECTIONS:  Ice or gel packs can be used to reduce both pain and swelling. Ice is the most helpful within the first 24 to 48 hours after an injury or flareup from overusing a muscle or joint.  Ice is effective, has very few side effects, and is safe for most people to use.   If you expose your skin to cold temperatures for too long or without the proper protection, you can damage your skin or nerves. Watch for signs of skin damage due to cold.   HOME CARE INSTRUCTIONS  Follow these tips to use ice and cold packs safely.  Place a dry or damp towel between the ice and skin. A damp towel will cool the skin more quickly, so you may need to shorten the time that the ice is used.  For a more rapid response, add gentle compression to the ice.  Ice for no more than 10 to 20 minutes at a time. The bonier the area you are icing, the less time it will take to get the benefits of ice.  Check your skin after 5 minutes to make sure there are no signs of a poor response to cold or skin damage.  Rest 20 minutes or more in between uses.  Once your skin is numb, you can end your treatment. You can test numbness by very lightly touching your skin. The touch should be so light that you do not see the skin dimple from the pressure of your fingertip. When using ice, most people will feel these normal sensations in this order: cold, burning, aching, and numbness.

## 2016-06-21 ENCOUNTER — Emergency Department (HOSPITAL_COMMUNITY): Payer: Self-pay

## 2016-06-21 ENCOUNTER — Emergency Department (HOSPITAL_COMMUNITY)
Admission: EM | Admit: 2016-06-21 | Discharge: 2016-06-21 | Disposition: A | Discharge status: Home / Self Care | Payer: Self-pay | Attending: Emergency Medicine | Admitting: Emergency Medicine

## 2016-06-21 ENCOUNTER — Encounter (HOSPITAL_COMMUNITY): Payer: Self-pay | Admitting: Emergency Medicine

## 2016-06-21 DIAGNOSIS — Y999 Unspecified external cause status: Secondary | ICD-10-CM | POA: Insufficient documentation

## 2016-06-21 DIAGNOSIS — F17210 Nicotine dependence, cigarettes, uncomplicated: Secondary | ICD-10-CM | POA: Insufficient documentation

## 2016-06-21 DIAGNOSIS — S93602A Unspecified sprain of left foot, initial encounter: Secondary | ICD-10-CM | POA: Insufficient documentation

## 2016-06-21 DIAGNOSIS — Y9259 Other trade areas as the place of occurrence of the external cause: Secondary | ICD-10-CM | POA: Insufficient documentation

## 2016-06-21 DIAGNOSIS — T1490XA Injury, unspecified, initial encounter: Secondary | ICD-10-CM

## 2016-06-21 DIAGNOSIS — Z79899 Other long term (current) drug therapy: Secondary | ICD-10-CM | POA: Insufficient documentation

## 2016-06-21 DIAGNOSIS — I10 Essential (primary) hypertension: Secondary | ICD-10-CM | POA: Insufficient documentation

## 2016-06-21 DIAGNOSIS — Y939 Activity, unspecified: Secondary | ICD-10-CM | POA: Insufficient documentation

## 2016-06-21 DIAGNOSIS — I2510 Atherosclerotic heart disease of native coronary artery without angina pectoris: Secondary | ICD-10-CM | POA: Insufficient documentation

## 2016-06-21 DIAGNOSIS — X501XXA Overexertion from prolonged static or awkward postures, initial encounter: Secondary | ICD-10-CM | POA: Insufficient documentation

## 2016-06-21 MED ORDER — IBUPROFEN 600 MG PO TABS: 600.0000 mg | ORAL_TABLET | Freq: Three times a day (TID) | ORAL | 0 refills | Status: DC | PRN

## 2016-06-21 MED ORDER — IBUPROFEN 800 MG PO TABS
800.0000 mg | ORAL_TABLET | Freq: Once | ORAL | Status: AC
  Administered 2016-06-21: 800 mg via ORAL
  Filled 2016-06-21: qty 1

## 2016-06-21 NOTE — ED Triage Notes (Signed)
Pt rolled his ankle off a two foot step down in his garage while trying to get something down.
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
## 2016-08-10 ENCOUNTER — Encounter (HOSPITAL_COMMUNITY): Payer: Self-pay | Admitting: Emergency Medicine

## 2016-08-10 ENCOUNTER — Emergency Department (HOSPITAL_COMMUNITY)
Admission: EM | Admit: 2016-08-10 | Discharge: 2016-08-10 | Disposition: A | Discharge status: Home / Self Care | Payer: Self-pay | Attending: Emergency Medicine | Admitting: Emergency Medicine

## 2016-08-10 DIAGNOSIS — F17290 Nicotine dependence, other tobacco product, uncomplicated: Secondary | ICD-10-CM | POA: Insufficient documentation

## 2016-08-10 DIAGNOSIS — H6991 Unspecified Eustachian tube disorder, right ear: Secondary | ICD-10-CM | POA: Insufficient documentation

## 2016-08-10 DIAGNOSIS — Z79899 Other long term (current) drug therapy: Secondary | ICD-10-CM | POA: Insufficient documentation

## 2016-08-10 DIAGNOSIS — H65191 Other acute nonsuppurative otitis media, right ear: Secondary | ICD-10-CM | POA: Insufficient documentation

## 2016-08-10 DIAGNOSIS — F17210 Nicotine dependence, cigarettes, uncomplicated: Secondary | ICD-10-CM | POA: Insufficient documentation

## 2016-08-10 DIAGNOSIS — I10 Essential (primary) hypertension: Secondary | ICD-10-CM | POA: Insufficient documentation

## 2016-08-10 DIAGNOSIS — F17220 Nicotine dependence, chewing tobacco, uncomplicated: Secondary | ICD-10-CM | POA: Insufficient documentation

## 2016-08-10 DIAGNOSIS — I2510 Atherosclerotic heart disease of native coronary artery without angina pectoris: Secondary | ICD-10-CM | POA: Insufficient documentation

## 2016-08-10 DIAGNOSIS — H6591 Unspecified nonsuppurative otitis media, right ear: Secondary | ICD-10-CM

## 2016-08-10 MED ORDER — AMOXICILLIN 500 MG PO CAPS: 500.0000 mg | ORAL_CAPSULE | Freq: Three times a day (TID) | ORAL | 0 refills | Status: AC

## 2016-08-10 MED ORDER — CETIRIZINE-PSEUDOEPHEDRINE ER 5-120 MG PO TB12: 1.0000 | ORAL_TABLET | Freq: Two times a day (BID) | ORAL | 0 refills | Status: DC

## 2016-08-10 MED ORDER — AMOXICILLIN 250 MG PO CAPS
500.0000 mg | ORAL_CAPSULE | Freq: Once | ORAL | Status: AC
  Administered 2016-08-10: 500 mg via ORAL
  Filled 2016-08-10: qty 2

## 2016-08-10 NOTE — ED Triage Notes (Signed)
Yellow drainage coming out of right ear x 3 weeks

## 2016-08-10 NOTE — ED Notes (Signed)
Patient states "I have high blood pressure but I'm not taking any medicine because I don't have insurance. I'm gonna die anyway, everybody in my family dies young."
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
## 2016-08-16 ENCOUNTER — Emergency Department (HOSPITAL_COMMUNITY)
Admission: EM | Admit: 2016-08-16 | Discharge: 2016-08-16 | Disposition: A | Discharge status: Home / Self Care | Payer: Self-pay | Attending: Emergency Medicine | Admitting: Emergency Medicine

## 2016-08-16 ENCOUNTER — Encounter (HOSPITAL_COMMUNITY): Payer: Self-pay

## 2016-08-16 DIAGNOSIS — H7291 Unspecified perforation of tympanic membrane, right ear: Secondary | ICD-10-CM | POA: Insufficient documentation

## 2016-08-16 DIAGNOSIS — F17210 Nicotine dependence, cigarettes, uncomplicated: Secondary | ICD-10-CM | POA: Insufficient documentation

## 2016-08-16 DIAGNOSIS — I2510 Atherosclerotic heart disease of native coronary artery without angina pectoris: Secondary | ICD-10-CM | POA: Insufficient documentation

## 2016-08-16 DIAGNOSIS — I10 Essential (primary) hypertension: Secondary | ICD-10-CM | POA: Insufficient documentation

## 2016-08-16 MED ORDER — MECLIZINE HCL 25 MG PO TABS
25.0000 mg | ORAL_TABLET | Freq: Once | ORAL | Status: AC
  Administered 2016-08-16: 25 mg via ORAL
  Filled 2016-08-16: qty 1

## 2016-08-16 MED ORDER — AMOXICILLIN-POT CLAVULANATE 875-125 MG PO TABS: 1.0000 | ORAL_TABLET | Freq: Two times a day (BID) | ORAL | 0 refills | Status: AC

## 2016-08-16 MED ORDER — MECLIZINE HCL 25 MG PO TABS: 25.0000 mg | ORAL_TABLET | Freq: Three times a day (TID) | ORAL | 0 refills | Status: DC | PRN

## 2016-08-16 MED ORDER — NAPROXEN 375 MG PO TABS: 375.0000 mg | ORAL_TABLET | Freq: Two times a day (BID) | ORAL | 0 refills | Status: AC | PRN

## 2016-08-16 MED ORDER — DIAZEPAM 5 MG PO TABS
5.0000 mg | ORAL_TABLET | Freq: Once | ORAL | Status: AC
  Administered 2016-08-16: 5 mg via ORAL
  Filled 2016-08-16: qty 1

## 2016-08-16 NOTE — ED Triage Notes (Signed)
Patient complains of ongoing acute dizziness x 2 weeks. Seen at [facility] ED last week for same and put on antibiotics. Patient states that he has bilateral ear drainage with bloody discharge that started when the dizziness started. No relief following antibiotic. Dizziness with nausea with any movment

## 2016-08-16 NOTE — ED Notes (Signed)
Pt with dizziness after 'ear drum popped'. Speaks full complete sentences.

## 2016-08-16 NOTE — Discharge Instructions (Signed)
DO NOT USE ANY OBJECTS, ESPECIALLY Q TIPS, TO CLEAR YOUR EARS  COVER THE OUTSIDE OF YOUR EAR WITH A COTTON SWAB BEFORE TAKING A SHOWER  NO SWIMMING OR SUBMERGING YOUR HEAD UNDERWATER
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
## 2016-08-17 ENCOUNTER — Emergency Department (HOSPITAL_COMMUNITY): Payer: Self-pay

## 2016-08-17 ENCOUNTER — Emergency Department (HOSPITAL_COMMUNITY)
Admission: EM | Admit: 2016-08-17 | Discharge: 2016-08-18 | Disposition: A | Discharge status: Home / Self Care | Payer: Self-pay | Attending: Emergency Medicine | Admitting: Emergency Medicine

## 2016-08-17 ENCOUNTER — Encounter (HOSPITAL_COMMUNITY): Payer: Self-pay

## 2016-08-17 DIAGNOSIS — F17210 Nicotine dependence, cigarettes, uncomplicated: Secondary | ICD-10-CM | POA: Insufficient documentation

## 2016-08-17 DIAGNOSIS — Y939 Activity, unspecified: Secondary | ICD-10-CM | POA: Insufficient documentation

## 2016-08-17 DIAGNOSIS — Z79899 Other long term (current) drug therapy: Secondary | ICD-10-CM | POA: Insufficient documentation

## 2016-08-17 DIAGNOSIS — I10 Essential (primary) hypertension: Secondary | ICD-10-CM | POA: Insufficient documentation

## 2016-08-17 DIAGNOSIS — Y999 Unspecified external cause status: Secondary | ICD-10-CM | POA: Insufficient documentation

## 2016-08-17 DIAGNOSIS — R569 Unspecified convulsions: Secondary | ICD-10-CM | POA: Insufficient documentation

## 2016-08-17 DIAGNOSIS — Y929 Unspecified place or not applicable: Secondary | ICD-10-CM | POA: Insufficient documentation

## 2016-08-17 DIAGNOSIS — I2510 Atherosclerotic heart disease of native coronary artery without angina pectoris: Secondary | ICD-10-CM | POA: Insufficient documentation

## 2016-08-17 DIAGNOSIS — H6691 Otitis media, unspecified, right ear: Secondary | ICD-10-CM | POA: Insufficient documentation

## 2016-08-17 DIAGNOSIS — W07XXXA Fall from chair, initial encounter: Secondary | ICD-10-CM | POA: Insufficient documentation

## 2016-08-17 DIAGNOSIS — R410 Disorientation, unspecified: Secondary | ICD-10-CM | POA: Insufficient documentation

## 2016-08-17 DIAGNOSIS — H6690 Otitis media, unspecified, unspecified ear: Secondary | ICD-10-CM

## 2016-08-17 NOTE — ED Triage Notes (Signed)
Patient comes by EMS for a fall and possible seizure.  Patient has Hx of seizures and is on meclizine and Augmentin for ruptured ear drum.  Patient is A&Ox4 at this time and not post ictal.

## 2016-08-17 NOTE — ED Notes (Signed)
Dr. Vanita Panda and Aaron Edelman - RN aware of pt's BP.
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
## 2016-08-18 LAB — CBC WITH DIFFERENTIAL/PLATELET
Basophils Absolute: 0 10*3/uL (ref 0.0–0.1)
Basophils Relative: 0 %
EOS ABS: 0.1 10*3/uL (ref 0.0–0.7)
EOS PCT: 1 %
HCT: 45.1 % (ref 39.0–52.0)
Hemoglobin: 16.5 g/dL (ref 13.0–17.0)
LYMPHS ABS: 2.2 10*3/uL (ref 0.7–4.0)
LYMPHS PCT: 26 %
MCH: 35 pg — AB (ref 26.0–34.0)
MCHC: 36.6 g/dL — ABNORMAL HIGH (ref 30.0–36.0)
MCV: 95.8 fL (ref 78.0–100.0)
MONO ABS: 0.4 10*3/uL (ref 0.1–1.0)
Monocytes Relative: 5 %
Neutro Abs: 5.8 10*3/uL (ref 1.7–7.7)
Neutrophils Relative %: 68 %
PLATELETS: 178 10*3/uL (ref 150–400)
RBC: 4.71 MIL/uL (ref 4.22–5.81)
RDW: 13.4 % (ref 11.5–15.5)
WBC: 8.5 10*3/uL (ref 4.0–10.5)

## 2016-08-18 LAB — RAPID URINE DRUG SCREEN, HOSP PERFORMED
AMPHETAMINES: NOT DETECTED
BARBITURATES: NOT DETECTED
BENZODIAZEPINES: NOT DETECTED
COCAINE: NOT DETECTED
OPIATES: NOT DETECTED
TETRAHYDROCANNABINOL: NOT DETECTED

## 2016-08-18 LAB — COMPREHENSIVE METABOLIC PANEL
ALT: 45 U/L (ref 17–63)
ANION GAP: 14 (ref 5–15)
AST: 37 U/L (ref 15–41)
Albumin: 4.6 g/dL (ref 3.5–5.0)
Alkaline Phosphatase: 67 U/L (ref 38–126)
BUN: 6 mg/dL (ref 6–20)
CHLORIDE: 106 mmol/L (ref 101–111)
CO2: 20 mmol/L — AB (ref 22–32)
CREATININE: 1.06 mg/dL (ref 0.61–1.24)
Calcium: 9.3 mg/dL (ref 8.9–10.3)
Glucose, Bld: 89 mg/dL (ref 65–99)
POTASSIUM: 3.8 mmol/L (ref 3.5–5.1)
SODIUM: 140 mmol/L (ref 135–145)
Total Bilirubin: 0.5 mg/dL (ref 0.3–1.2)
Total Protein: 7.1 g/dL (ref 6.5–8.1)

## 2016-08-18 MED ORDER — AMLODIPINE BESYLATE 5 MG PO TABS: 5.0000 mg | ORAL_TABLET | Freq: Every day | ORAL | 6 refills | Status: DC

## 2016-08-31 ENCOUNTER — Emergency Department (HOSPITAL_COMMUNITY): Payer: Self-pay

## 2016-08-31 ENCOUNTER — Encounter (HOSPITAL_COMMUNITY): Payer: Self-pay | Admitting: Emergency Medicine

## 2016-08-31 ENCOUNTER — Emergency Department (HOSPITAL_COMMUNITY)
Admission: EM | Admit: 2016-08-31 | Discharge: 2016-09-01 | Disposition: A | Discharge status: Home / Self Care | Payer: Self-pay | Attending: Emergency Medicine | Admitting: Emergency Medicine

## 2016-08-31 DIAGNOSIS — I10 Essential (primary) hypertension: Secondary | ICD-10-CM | POA: Insufficient documentation

## 2016-08-31 DIAGNOSIS — I2510 Atherosclerotic heart disease of native coronary artery without angina pectoris: Secondary | ICD-10-CM | POA: Insufficient documentation

## 2016-08-31 DIAGNOSIS — F17210 Nicotine dependence, cigarettes, uncomplicated: Secondary | ICD-10-CM | POA: Insufficient documentation

## 2016-08-31 DIAGNOSIS — R0789 Other chest pain: Secondary | ICD-10-CM | POA: Insufficient documentation

## 2016-08-31 DIAGNOSIS — Z79899 Other long term (current) drug therapy: Secondary | ICD-10-CM | POA: Insufficient documentation

## 2016-08-31 DIAGNOSIS — Z955 Presence of coronary angioplasty implant and graft: Secondary | ICD-10-CM | POA: Insufficient documentation

## 2016-08-31 LAB — COMPREHENSIVE METABOLIC PANEL
ALBUMIN: 3.9 g/dL (ref 3.5–5.0)
ALT: 46 U/L (ref 17–63)
AST: 59 U/L — AB (ref 15–41)
Alkaline Phosphatase: 66 U/L (ref 38–126)
Anion gap: 9 (ref 5–15)
BILIRUBIN TOTAL: 0.6 mg/dL (ref 0.3–1.2)
BUN: 7 mg/dL (ref 6–20)
CO2: 25 mmol/L (ref 22–32)
CREATININE: 1.09 mg/dL (ref 0.61–1.24)
Calcium: 8.8 mg/dL — ABNORMAL LOW (ref 8.9–10.3)
Chloride: 102 mmol/L (ref 101–111)
GFR calc Af Amer: >60 mL/min (ref >60)
GLUCOSE: 106 mg/dL — AB (ref 65–99)
Potassium: 3.6 mmol/L (ref 3.5–5.1)
Sodium: 136 mmol/L (ref 135–145)
TOTAL PROTEIN: 5.8 g/dL — AB (ref 6.5–8.1)

## 2016-08-31 LAB — LIPASE, BLOOD: Lipase: 23 U/L (ref 11–51)

## 2016-08-31 LAB — CBC
HCT: 38.3 % — ABNORMAL LOW (ref 39.0–52.0)
Hemoglobin: 13.5 g/dL (ref 13.0–17.0)
MCH: 34 pg (ref 26.0–34.0)
MCHC: 35.2 g/dL (ref 30.0–36.0)
MCV: 96.5 fL (ref 78.0–100.0)
Platelets: 150 10*3/uL (ref 150–400)
RBC: 3.97 MIL/uL — ABNORMAL LOW (ref 4.22–5.81)
RDW: 13.6 % (ref 11.5–15.5)
WBC: 6.3 10*3/uL (ref 4.0–10.5)

## 2016-08-31 LAB — RAPID STREP SCREEN (MED CTR MEBANE ONLY): Streptococcus, Group A Screen (Direct): NEGATIVE

## 2016-08-31 MED ORDER — IPRATROPIUM-ALBUTEROL 0.5-2.5 (3) MG/3ML IN SOLN
3.0000 mL | Freq: Once | RESPIRATORY_TRACT | Status: AC
  Administered 2016-09-01: 3 mL via RESPIRATORY_TRACT
  Filled 2016-08-31: qty 3

## 2016-08-31 MED ORDER — KETOROLAC TROMETHAMINE 15 MG/ML IJ SOLN
15.0000 mg | Freq: Once | INTRAMUSCULAR | Status: AC
  Administered 2016-09-01: 15 mg via INTRAVENOUS
  Filled 2016-08-31: qty 1

## 2016-08-31 MED ORDER — SODIUM CHLORIDE 0.9 % IV BOLUS (SEPSIS)
1000.0000 mL | Freq: Once | INTRAVENOUS | Status: AC
  Administered 2016-09-01: 1000 mL via INTRAVENOUS

## 2016-08-31 MED ORDER — GI COCKTAIL ~~LOC~~
30.0000 mL | Freq: Once | ORAL | Status: AC
  Administered 2016-09-01: 30 mL via ORAL
  Filled 2016-08-31: qty 30

## 2016-08-31 MED ORDER — ONDANSETRON HCL 4 MG/2ML IJ SOLN
4.0000 mg | Freq: Once | INTRAMUSCULAR | Status: AC
  Administered 2016-09-01: 4 mg via INTRAVENOUS
  Filled 2016-08-31: qty 2

## 2016-08-31 MED ORDER — OXYCODONE-ACETAMINOPHEN 5-325 MG PO TABS
1.0000 | ORAL_TABLET | Freq: Once | ORAL | Status: AC
  Administered 2016-08-31: 1 via ORAL

## 2016-08-31 MED ORDER — OXYCODONE-ACETAMINOPHEN 5-325 MG PO TABS
ORAL_TABLET | ORAL | Status: AC
  Filled 2016-08-31: qty 1

## 2016-08-31 NOTE — ED Notes (Signed)
Patient transported to X-ray 

## 2016-08-31 NOTE — ED Notes (Signed)
Rounded on patient, apologized for wait.

## 2016-08-31 NOTE — ED Triage Notes (Signed)
Pt. Stated, I've had this pain on the right side of my stomach for 2 days.

## 2016-08-31 NOTE — ED Notes (Signed)
Called pt's name to obtain vital signs, called pt's name twice. No one answered. Nurse was notified and on coming tech was notified.

## 2016-08-31 NOTE — ED Notes (Signed)
Pt up to desk asking for pain management, see new orders per pain protocol

## 2016-09-01 LAB — I-STAT TROPONIN, ED
Troponin i, poc: 0 ng/mL (ref 0.00–0.08)
Troponin i, poc: 0 ng/mL (ref 0.00–0.08)

## 2016-09-01 LAB — I-STAT VENOUS BLOOD GAS, ED
Acid-Base Excess: 2 mmol/L (ref 0.0–2.0)
BICARBONATE: 26.9 mmol/L (ref 20.0–28.0)
O2 SAT: 76 %
PCO2 VEN: 42.9 mmHg — AB (ref 44.0–60.0)
PH VEN: 7.406 (ref 7.250–7.430)
TCO2: 28 mmol/L (ref 0–100)
pO2, Ven: 41 mmHg (ref 32.0–45.0)

## 2016-09-01 LAB — URINALYSIS, ROUTINE W REFLEX MICROSCOPIC
Bilirubin Urine: NEGATIVE
Glucose, UA: NEGATIVE mg/dL
Hgb urine dipstick: NEGATIVE
KETONES UR: NEGATIVE mg/dL
LEUKOCYTES UA: NEGATIVE
NITRITE: NEGATIVE
PH: 5 (ref 5.0–8.0)
Protein, ur: NEGATIVE mg/dL
Specific Gravity, Urine: 1.026 (ref 1.005–1.030)

## 2016-09-01 LAB — D-DIMER, QUANTITATIVE: D-Dimer, Quant: <0.27 ug/mL-FEU (ref 0.00–0.50)

## 2016-09-01 MED ORDER — FENTANYL CITRATE (PF) 100 MCG/2ML IJ SOLN
50.0000 ug | Freq: Once | INTRAMUSCULAR | Status: AC
Start: 2016-09-01 — End: 2016-09-01
  Administered 2016-09-01: 50 ug via INTRAVENOUS
  Filled 2016-09-01: qty 2

## 2016-09-01 MED ORDER — METHOCARBAMOL 500 MG PO TABS: 500.0000 mg | ORAL_TABLET | Freq: Two times a day (BID) | ORAL | 0 refills | Status: DC

## 2016-09-01 MED ORDER — NAPROXEN 500 MG PO TABS
500.0000 mg | ORAL_TABLET | Freq: Two times a day (BID) | ORAL | 0 refills | Status: DC
Start: 2016-09-01 — End: 2017-05-18

## 2016-09-01 MED ORDER — OXYCODONE-ACETAMINOPHEN 5-325 MG PO TABS: 1.0000 | ORAL_TABLET | Freq: Once | ORAL | Status: DC
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
## 2016-09-01 NOTE — ED Notes (Signed)
Pt pulse O2 sat started at 97 while sitting at bedside. Pt ambulated self efficiently with no difficulty and no SOB. Pt O2 sat fluctuated from 97 - 90 - 98.

## 2016-09-01 NOTE — ED Notes (Addendum)
Pulse ox ranging in 85-86% on room air while patient sleeping. Oxygen applied at 2 LPM via nasal cannula. No resp distress noted.

## 2016-09-01 NOTE — ED Provider Notes (Signed)
Hand-off from [employer], PA-C. Pendings labs.  See initial provider's note for full HPI.  Briefly patient has a 35 year old male with history of CAD, OSA and hypertension who presents the ED with multiple complaints. Patient reports having right-sided rib pain for the past 2 weeks. He reports having newosnet seizure 2 weeks ago and reports being evaluated in the ED and discharged home with outpatient neurology follow-up. Patient reports he has been having right rib pain since the seizure. Denies any other recent fall or injury. He also reports having sore throat.   Physical Exam  BP 111/83   Pulse 63   Temp 97.7 F (36.5 C) (Oral)   Resp 14   Ht 6\' 2"  (1.88 m)   Wt 113.4 kg   SpO2 91%   BMI 32.10 kg/m   Physical Exam  Constitutional: He is oriented to person, place, and time. He appears well-developed and well-nourished. No distress.  HENT:  Head: Normocephalic and atraumatic.  Mouth/Throat: Uvula is midline, oropharynx is clear and moist and mucous membranes are normal. No oropharyngeal exudate, posterior oropharyngeal edema, posterior oropharyngeal erythema or tonsillar abscesses. No tonsillar exudate.  Eyes: Conjunctivae and EOM are normal. Right eye exhibits no discharge. Left eye exhibits no discharge. No scleral icterus.  Neck: Normal range of motion. Neck supple.  Cardiovascular: Normal rate, regular rhythm, normal heart sounds and intact distal pulses.   Pulmonary/Chest: Effort normal and breath sounds normal. No respiratory distress. He has no wheezes. He has no rales. He exhibits tenderness (mild TTP over right lateral inferior ribs). He exhibits no laceration, no crepitus, no edema, no deformity, no swelling and no retraction.  Abdominal: Soft. Bowel sounds are normal. He exhibits no distension and no mass. There is no tenderness. There is no rebound and no guarding. No hernia.  Musculoskeletal: Normal range of motion. He exhibits no edema.  Neurological: He is alert and  oriented to person, place, and time.  Skin: Skin is warm and dry. He is not diaphoretic.  Nursing note and vitals reviewed.   ED Course  Procedures  MDM Patient presented with multiple complaints including right rib pain after having a seizure 2 weeks ago and sore throat over the past 2 days. VSS. Exam performed by initial provider revealed tenderness to right lateral chest wall, no erythema or deformities. Expiratory wheezes noted throughout. No neuro deficits. Labs unremarkable. Negative strep. Troponin negative. Chest x-ray negative. Right rib pain appears to be musculoskeletal in etiology. EKG showed sinus rhythm without acute changes. However due to patient's cardiac history that troponin ordered by initial provider. Patient given DuoNeb in the ED with significant improvement of lung sounds. Patient was noted to desat to 85% while sleeping. Upon awakening in the ED patient has remained above 90% on room air. Pending d-dimer, VBG (to evaluate for hypercapnia) and delta troponin. Plan to ambulate patient with pulse ox once labs have returned.  On my initial valuation patient is resting comfortably in bed. O2 saturation has remained above 90% on room air. Exam revealed mild tenderness over right lateral inferior ribs without evidence of injury. Remaining exam unremarkable. D-dimer negative. Delta troponin negative. VBG showed CO2 42. Patient ambulated with O2 saturation remained above 90% on room air. Discussed results and plan for discharge with patient. Plan to discharge patient home with symptomatically treatment for rib pain and advised patient to follow up with PCP for reevaluation this week. Patient has remained hemodynamically stable while in the ED and is appropriate for discharge.  Chesley Noon [place], [place] 09/01/16 0700    Ripley Fraise, MD 09/01/16 [phone]

## 2016-09-01 NOTE — Discharge Instructions (Addendum)
Take your medications as prescribed as needed for pain relief. Please follow up with a primary care provider from the Resource Guide provided below in one week for follow-up evaluation. Please follow up with a primary care provider from the Resource Guide provided below in fever, chest pain, difficulty breathing, coughing up blood, abdominal pain, vomiting, unable to keep fluids down, numbness, tingling, weakness, syncope, seizure.

## 2016-09-03 LAB — CULTURE, GROUP A STREP (THRC)

## 2016-12-03 ENCOUNTER — Encounter (HOSPITAL_COMMUNITY): Payer: Self-pay | Admitting: *Deleted

## 2016-12-03 ENCOUNTER — Emergency Department (HOSPITAL_COMMUNITY)
Admission: EM | Admit: 2016-12-03 | Discharge: 2016-12-03 | Disposition: A | Discharge status: Home / Self Care | Payer: Self-pay | Attending: Emergency Medicine | Admitting: Emergency Medicine

## 2016-12-03 DIAGNOSIS — J329 Chronic sinusitis, unspecified: Secondary | ICD-10-CM

## 2016-12-03 DIAGNOSIS — Z7982 Long term (current) use of aspirin: Secondary | ICD-10-CM | POA: Insufficient documentation

## 2016-12-03 DIAGNOSIS — I10 Essential (primary) hypertension: Secondary | ICD-10-CM | POA: Insufficient documentation

## 2016-12-03 DIAGNOSIS — I2510 Atherosclerotic heart disease of native coronary artery without angina pectoris: Secondary | ICD-10-CM | POA: Insufficient documentation

## 2016-12-03 DIAGNOSIS — J018 Other acute sinusitis: Secondary | ICD-10-CM | POA: Insufficient documentation

## 2016-12-03 DIAGNOSIS — F17290 Nicotine dependence, other tobacco product, uncomplicated: Secondary | ICD-10-CM | POA: Insufficient documentation

## 2016-12-03 DIAGNOSIS — F17210 Nicotine dependence, cigarettes, uncomplicated: Secondary | ICD-10-CM | POA: Insufficient documentation

## 2016-12-03 DIAGNOSIS — H6504 Acute serous otitis media, recurrent, right ear: Secondary | ICD-10-CM | POA: Insufficient documentation

## 2016-12-03 MED ORDER — DEXAMETHASONE 4 MG PO TABS: 4.0000 mg | ORAL_TABLET | Freq: Two times a day (BID) | ORAL | 0 refills | Status: DC

## 2016-12-03 MED ORDER — PREDNISONE 20 MG PO TABS
40.0000 mg | ORAL_TABLET | Freq: Once | ORAL | Status: AC
  Administered 2016-12-03: 40 mg via ORAL
  Filled 2016-12-03: qty 2

## 2016-12-03 MED ORDER — CEFTRIAXONE SODIUM 1 G IJ SOLR
1.0000 g | Freq: Once | INTRAMUSCULAR | Status: AC
  Administered 2016-12-03: 1 g via INTRAMUSCULAR
  Filled 2016-12-03: qty 10

## 2016-12-03 MED ORDER — METOPROLOL TARTRATE 50 MG PO TABS: 50.0000 mg | ORAL_TABLET | Freq: Two times a day (BID) | ORAL | 1 refills | Status: DC

## 2016-12-03 MED ORDER — LIDOCAINE HCL (PF) 1 % IJ SOLN
INTRAMUSCULAR | Status: AC
  Administered 2016-12-03: 2.1 mL
  Filled 2016-12-03: qty 5

## 2016-12-03 MED ORDER — PSEUDOEPHEDRINE HCL 60 MG PO TABS
60.0000 mg | ORAL_TABLET | Freq: Once | ORAL | Status: AC
  Administered 2016-12-03: 60 mg via ORAL
  Filled 2016-12-03: qty 1

## 2016-12-03 MED ORDER — LORATADINE-PSEUDOEPHEDRINE ER 5-120 MG PO TB12: 1.0000 | ORAL_TABLET | Freq: Two times a day (BID) | ORAL | 0 refills | Status: DC

## 2016-12-03 NOTE — ED Triage Notes (Signed)
Pt c/o right ear drainage and pain that started 1 month ago. Pt reports yellow, thick ear drainage and hearing loss to right ear. Pt has been on and off antibiotics for this same issue x 3 months.

## 2016-12-03 NOTE — Discharge Instructions (Signed)
Your vital signs are within normal limits. Your treated in the emergency department with intramuscular antibiotic and steroid medication. Please continue the steroid and the decongesting medication. Please increase fluids. Please see the ear nose and throat specialist as sone as possible.
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
## 2017-02-10 ENCOUNTER — Encounter (HOSPITAL_COMMUNITY): Payer: Self-pay | Admitting: *Deleted

## 2017-02-10 ENCOUNTER — Emergency Department (HOSPITAL_COMMUNITY)
Admission: EM | Admit: 2017-02-10 | Discharge: 2017-02-10 | Disposition: A | Discharge status: Home / Self Care | Payer: Self-pay | Attending: Emergency Medicine | Admitting: Emergency Medicine

## 2017-02-10 ENCOUNTER — Emergency Department (HOSPITAL_COMMUNITY): Payer: Self-pay

## 2017-02-10 DIAGNOSIS — Z7982 Long term (current) use of aspirin: Secondary | ICD-10-CM | POA: Insufficient documentation

## 2017-02-10 DIAGNOSIS — F17210 Nicotine dependence, cigarettes, uncomplicated: Secondary | ICD-10-CM | POA: Insufficient documentation

## 2017-02-10 DIAGNOSIS — S61213A Laceration without foreign body of left middle finger without damage to nail, initial encounter: Secondary | ICD-10-CM | POA: Insufficient documentation

## 2017-02-10 DIAGNOSIS — S61208A Unspecified open wound of other finger without damage to nail, initial encounter: Secondary | ICD-10-CM

## 2017-02-10 DIAGNOSIS — I10 Essential (primary) hypertension: Secondary | ICD-10-CM | POA: Insufficient documentation

## 2017-02-10 DIAGNOSIS — Y929 Unspecified place or not applicable: Secondary | ICD-10-CM | POA: Insufficient documentation

## 2017-02-10 DIAGNOSIS — Z79899 Other long term (current) drug therapy: Secondary | ICD-10-CM | POA: Insufficient documentation

## 2017-02-10 DIAGNOSIS — I2510 Atherosclerotic heart disease of native coronary artery without angina pectoris: Secondary | ICD-10-CM | POA: Insufficient documentation

## 2017-02-10 DIAGNOSIS — Y999 Unspecified external cause status: Secondary | ICD-10-CM | POA: Insufficient documentation

## 2017-02-10 DIAGNOSIS — Z760 Encounter for issue of repeat prescription: Secondary | ICD-10-CM | POA: Insufficient documentation

## 2017-02-10 DIAGNOSIS — W312XXA Contact with powered woodworking and forming machines, initial encounter: Secondary | ICD-10-CM | POA: Insufficient documentation

## 2017-02-10 DIAGNOSIS — Y9389 Activity, other specified: Secondary | ICD-10-CM | POA: Insufficient documentation

## 2017-02-10 DIAGNOSIS — S61201A Unspecified open wound of left index finger without damage to nail, initial encounter: Secondary | ICD-10-CM | POA: Insufficient documentation

## 2017-02-10 MED ORDER — CEPHALEXIN 500 MG PO CAPS: 500.0000 mg | ORAL_CAPSULE | Freq: Four times a day (QID) | ORAL | 0 refills | Status: DC

## 2017-02-10 MED ORDER — OXYCODONE-ACETAMINOPHEN 5-325 MG PO TABS: 1.0000 | ORAL_TABLET | ORAL | 0 refills | Status: DC | PRN

## 2017-02-10 MED ORDER — CEPHALEXIN 500 MG PO CAPS
500.0000 mg | ORAL_CAPSULE | Freq: Once | ORAL | Status: AC
  Administered 2017-02-10: 500 mg via ORAL
  Filled 2017-02-10: qty 1

## 2017-02-10 MED ORDER — METOPROLOL TARTRATE 50 MG PO TABS: 50.0000 mg | ORAL_TABLET | Freq: Two times a day (BID) | ORAL | 0 refills | Status: DC

## 2017-02-10 MED ORDER — OXYCODONE-ACETAMINOPHEN 5-325 MG PO TABS
1.0000 | ORAL_TABLET | Freq: Once | ORAL | Status: AC
  Administered 2017-02-10: 1 via ORAL
  Filled 2017-02-10: qty 1

## 2017-02-10 MED ORDER — LIDOCAINE HCL (PF) 2 % IJ SOLN
10.0000 mL | Freq: Once | INTRAMUSCULAR | Status: DC
  Filled 2017-02-10: qty 10

## 2017-02-10 NOTE — ED Triage Notes (Signed)
Pt was working with a skill saw and lacerated his left hand pointer and middle finger. Pt has section or pointer finger removed (which he has with him). Bleeding controlled.

## 2017-02-10 NOTE — Discharge Instructions (Signed)
Keep the wounds clean and bandaged.  Replace the xeroform gauze daily.  Keep the finger splinted for at least 1-2 weeks.  Sutures out in 10 days.  Call Dr. Ruthe Mannan office to arrange a follow-up if needed.  Return for any signs of infection
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
## 2017-05-18 ENCOUNTER — Emergency Department (HOSPITAL_COMMUNITY)
Admission: EM | Admit: 2017-05-18 | Discharge: 2017-05-18 | Disposition: A | Discharge status: Home / Self Care | Payer: Self-pay | Attending: Emergency Medicine | Admitting: Emergency Medicine

## 2017-05-18 ENCOUNTER — Encounter (HOSPITAL_COMMUNITY): Payer: Self-pay | Admitting: Emergency Medicine

## 2017-05-18 ENCOUNTER — Emergency Department (HOSPITAL_COMMUNITY): Payer: Self-pay

## 2017-05-18 DIAGNOSIS — W0110XA Fall on same level from slipping, tripping and stumbling with subsequent striking against unspecified object, initial encounter: Secondary | ICD-10-CM | POA: Insufficient documentation

## 2017-05-18 DIAGNOSIS — F17210 Nicotine dependence, cigarettes, uncomplicated: Secondary | ICD-10-CM | POA: Insufficient documentation

## 2017-05-18 DIAGNOSIS — I2510 Atherosclerotic heart disease of native coronary artery without angina pectoris: Secondary | ICD-10-CM | POA: Insufficient documentation

## 2017-05-18 DIAGNOSIS — Z79899 Other long term (current) drug therapy: Secondary | ICD-10-CM | POA: Insufficient documentation

## 2017-05-18 DIAGNOSIS — Z760 Encounter for issue of repeat prescription: Secondary | ICD-10-CM | POA: Insufficient documentation

## 2017-05-18 DIAGNOSIS — Y9289 Other specified places as the place of occurrence of the external cause: Secondary | ICD-10-CM | POA: Insufficient documentation

## 2017-05-18 DIAGNOSIS — Y939 Activity, unspecified: Secondary | ICD-10-CM | POA: Insufficient documentation

## 2017-05-18 DIAGNOSIS — R569 Unspecified convulsions: Secondary | ICD-10-CM

## 2017-05-18 DIAGNOSIS — Z789 Other specified health status: Secondary | ICD-10-CM

## 2017-05-18 DIAGNOSIS — G40909 Epilepsy, unspecified, not intractable, without status epilepticus: Secondary | ICD-10-CM | POA: Insufficient documentation

## 2017-05-18 DIAGNOSIS — S80212A Abrasion, left knee, initial encounter: Secondary | ICD-10-CM | POA: Insufficient documentation

## 2017-05-18 DIAGNOSIS — I10 Essential (primary) hypertension: Secondary | ICD-10-CM | POA: Insufficient documentation

## 2017-05-18 DIAGNOSIS — Z7982 Long term (current) use of aspirin: Secondary | ICD-10-CM | POA: Insufficient documentation

## 2017-05-18 DIAGNOSIS — Y990 Civilian activity done for income or pay: Secondary | ICD-10-CM | POA: Insufficient documentation

## 2017-05-18 LAB — CBC WITH DIFFERENTIAL/PLATELET
Basophils Absolute: 0 K/uL (ref 0.0–0.1)
Basophils Relative: 0 %
Eosinophils Absolute: 0.1 K/uL (ref 0.0–0.7)
Eosinophils Relative: 2 %
HCT: 41.7 % (ref 39.0–52.0)
Hemoglobin: 14.6 g/dL (ref 13.0–17.0)
Lymphocytes Relative: 24 %
Lymphs Abs: 2 K/uL (ref 0.7–4.0)
MCH: 33.3 pg (ref 26.0–34.0)
MCHC: 35 g/dL (ref 30.0–36.0)
MCV: 95.2 fL (ref 78.0–100.0)
Monocytes Absolute: 0.4 K/uL (ref 0.1–1.0)
Monocytes Relative: 5 %
Neutro Abs: 5.5 K/uL (ref 1.7–7.7)
Neutrophils Relative %: 69 %
Platelets: 164 K/uL (ref 150–400)
RBC: 4.38 MIL/uL (ref 4.22–5.81)
RDW: 12.9 % (ref 11.5–15.5)
WBC: 8 K/uL (ref 4.0–10.5)

## 2017-05-18 LAB — BASIC METABOLIC PANEL WITH GFR
Anion gap: 9 (ref 5–15)
BUN: 10 mg/dL (ref 6–20)
CO2: 22 mmol/L (ref 22–32)
Calcium: 8.6 mg/dL — ABNORMAL LOW (ref 8.9–10.3)
Chloride: 108 mmol/L (ref 101–111)
Creatinine, Ser: 1.04 mg/dL (ref 0.61–1.24)
GFR calc Af Amer: >60 mL/min
GFR calc non Af Amer: >60 mL/min
Glucose, Bld: 106 mg/dL — ABNORMAL HIGH (ref 65–99)
Potassium: 3.6 mmol/L (ref 3.5–5.1)
Sodium: 139 mmol/L (ref 135–145)

## 2017-05-18 LAB — LITHIUM LEVEL: Lithium Lvl: 0.42 mmol/L — ABNORMAL LOW (ref 0.60–1.20)

## 2017-05-18 LAB — TROPONIN I: Troponin I: <0.03 ng/mL (ref <0.03)

## 2017-05-18 MED ORDER — SODIUM CHLORIDE 0.9 % IV BOLUS (SEPSIS)
1000.0000 mL | Freq: Once | INTRAVENOUS | Status: AC
  Administered 2017-05-18: 1000 mL via INTRAVENOUS

## 2017-05-18 MED ORDER — BUPROPION HCL ER (SR) 150 MG PO TB12: 150.0000 mg | ORAL_TABLET | Freq: Two times a day (BID) | ORAL | 0 refills | Status: DC

## 2017-05-18 MED ORDER — LEVETIRACETAM 500 MG PO TABS: 500.0000 mg | ORAL_TABLET | Freq: Two times a day (BID) | ORAL | 0 refills | Status: DC

## 2017-05-18 MED ORDER — LEVETIRACETAM IN NACL 1000 MG/100ML IV SOLN
1000.0000 mg | Freq: Once | INTRAVENOUS | Status: AC
  Administered 2017-05-18: 1000 mg via INTRAVENOUS
  Filled 2017-05-18: qty 100

## 2017-05-18 MED ORDER — LITHIUM CARBONATE 300 MG PO CAPS: 300.0000 mg | ORAL_CAPSULE | Freq: Three times a day (TID) | ORAL | 0 refills | Status: DC

## 2017-05-18 NOTE — Discharge Instructions (Signed)
Please take all medicine as prescribed.  Call Dr. Freddie Apley office for follow up.  Do not drive or do any activity that would harm yourself or another if you were to have a seizure.

## 2017-05-18 NOTE — ED Notes (Signed)
Pt reports he has been taking wellbutrin since January, he ran out and has not taken since Tuesday.  edp notified.

## 2017-05-18 NOTE — ED Triage Notes (Addendum)
Had syncopal episode while working outside today.  Pt is awake, alert and a&o x 4 at this time.  Denies any pain. bg 100 per ems
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
## 2017-05-19 ENCOUNTER — Emergency Department (HOSPITAL_COMMUNITY)
Admission: EM | Admit: 2017-05-19 | Discharge: 2017-05-19 | Disposition: A | Discharge status: Home / Self Care | Payer: Self-pay | Attending: Emergency Medicine | Admitting: Emergency Medicine

## 2017-05-19 ENCOUNTER — Encounter (HOSPITAL_COMMUNITY): Payer: Self-pay

## 2017-05-19 DIAGNOSIS — K047 Periapical abscess without sinus: Secondary | ICD-10-CM | POA: Insufficient documentation

## 2017-05-19 DIAGNOSIS — F17210 Nicotine dependence, cigarettes, uncomplicated: Secondary | ICD-10-CM | POA: Insufficient documentation

## 2017-05-19 DIAGNOSIS — Q249 Congenital malformation of heart, unspecified: Secondary | ICD-10-CM | POA: Insufficient documentation

## 2017-05-19 DIAGNOSIS — I10 Essential (primary) hypertension: Secondary | ICD-10-CM | POA: Insufficient documentation

## 2017-05-19 DIAGNOSIS — I2510 Atherosclerotic heart disease of native coronary artery without angina pectoris: Secondary | ICD-10-CM | POA: Insufficient documentation

## 2017-05-19 HISTORY — DX: Unspecified convulsions: R56.9

## 2017-05-19 MED ORDER — MORPHINE SULFATE (PF) 4 MG/ML IV SOLN
6.0000 mg | Freq: Once | INTRAVENOUS | Status: AC
  Administered 2017-05-19: 6 mg via INTRAMUSCULAR
  Filled 2017-05-19: qty 2

## 2017-05-19 MED ORDER — BENZOCAINE 20 % MT AERO
INHALATION_SPRAY | Freq: Once | OROMUCOSAL | Status: AC
  Administered 2017-05-19: 18:00:00 via OROMUCOSAL
  Filled 2017-05-19: qty 57

## 2017-05-19 MED ORDER — CLINDAMYCIN HCL 300 MG PO CAPS: 300.0000 mg | ORAL_CAPSULE | Freq: Four times a day (QID) | ORAL | 0 refills | Status: DC

## 2017-05-19 MED ORDER — OXYCODONE-ACETAMINOPHEN 5-325 MG PO TABS
1.0000 | ORAL_TABLET | Freq: Once | ORAL | Status: AC
  Administered 2017-05-19: 1 via ORAL
  Filled 2017-05-19: qty 1

## 2017-05-19 MED ORDER — OXYCODONE-ACETAMINOPHEN 5-325 MG PO TABS: 1.0000 | ORAL_TABLET | Freq: Three times a day (TID) | ORAL | 0 refills | Status: DC | PRN

## 2017-05-19 MED ORDER — CLINDAMYCIN HCL 150 MG PO CAPS
300.0000 mg | ORAL_CAPSULE | Freq: Once | ORAL | Status: AC
  Administered 2017-05-19: 300 mg via ORAL
  Filled 2017-05-19: qty 2

## 2017-05-19 MED ORDER — IBUPROFEN 400 MG PO TABS: 400.0000 mg | ORAL_TABLET | Freq: Four times a day (QID) | ORAL | 0 refills | Status: DC | PRN
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
## 2017-05-19 NOTE — ED Notes (Signed)
Advised pt not to drive after receiving morphine. Pt verbalized understanding

## 2017-05-19 NOTE — ED Notes (Signed)
Equipment set up in room for drainage

## 2017-05-19 NOTE — ED Notes (Signed)
Facial swelling noted on assessment as well as large dental abscess on right upper gum

## 2017-05-19 NOTE — ED Triage Notes (Signed)
Pt reports woke up with dental abscess this morning.  R side of face swollen.  Reports was seen here for seizures yesterday.

## 2017-05-22 ENCOUNTER — Emergency Department (HOSPITAL_COMMUNITY)
Admission: EM | Admit: 2017-05-22 | Discharge: 2017-05-22 | Disposition: A | Discharge status: Home / Self Care | Payer: Medicaid Other | Attending: Emergency Medicine | Admitting: Emergency Medicine

## 2017-05-22 DIAGNOSIS — I2510 Atherosclerotic heart disease of native coronary artery without angina pectoris: Secondary | ICD-10-CM | POA: Diagnosis not present

## 2017-05-22 DIAGNOSIS — I10 Essential (primary) hypertension: Secondary | ICD-10-CM | POA: Insufficient documentation

## 2017-05-22 DIAGNOSIS — F17210 Nicotine dependence, cigarettes, uncomplicated: Secondary | ICD-10-CM | POA: Diagnosis not present

## 2017-05-22 DIAGNOSIS — R569 Unspecified convulsions: Secondary | ICD-10-CM

## 2017-05-22 LAB — I-STAT CHEM 8, ED
BUN: 9 mg/dL (ref 6–20)
CALCIUM ION: 1.14 mmol/L — AB (ref 1.15–1.40)
CREATININE: 1 mg/dL (ref 0.61–1.24)
Chloride: 103 mmol/L (ref 101–111)
GLUCOSE: 111 mg/dL — AB (ref 65–99)
HCT: 41 % (ref 39.0–52.0)
HEMOGLOBIN: 13.9 g/dL (ref 13.0–17.0)
Potassium: 3.5 mmol/L (ref 3.5–5.1)
Sodium: 143 mmol/L (ref 135–145)
TCO2: 28 mmol/L (ref 22–32)

## 2017-05-22 LAB — TROPONIN I

## 2017-05-22 MED ORDER — SODIUM CHLORIDE 0.9 % IV BOLUS (SEPSIS)
500.0000 mL | Freq: Once | INTRAVENOUS | Status: DC
Start: 2017-05-22 — End: 2017-05-22

## 2017-05-22 MED ORDER — LEVETIRACETAM 500 MG PO TABS
500.0000 mg | ORAL_TABLET | Freq: Once | ORAL | Status: AC
  Administered 2017-05-22: 500 mg via ORAL
  Filled 2017-05-22: qty 1

## 2017-05-22 MED ORDER — KETOROLAC TROMETHAMINE 15 MG/ML IJ SOLN
7.5000 mg | Freq: Once | INTRAMUSCULAR | Status: AC
  Administered 2017-05-22: 7.5 mg via INTRAVENOUS
  Filled 2017-05-22: qty 1

## 2017-05-22 MED ORDER — KETOROLAC TROMETHAMINE 15 MG/ML IJ SOLN
15.0000 mg | Freq: Once | INTRAMUSCULAR | Status: AC
Start: 2017-05-22 — End: 2017-05-22
  Administered 2017-05-22: 15 mg via INTRAVENOUS
  Filled 2017-05-22: qty 1

## 2017-05-22 NOTE — ED Triage Notes (Signed)
Per ems was called after pt had a seizures at his child birthday party. PT WAS ALERT AND ORIENTED UPON EMS ARRIVAL. pT WAS ONLY COMPLAINING OF A HEADACHE. Pt  Stated this was the second seizure he had this week.
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
## 2017-05-22 NOTE — Discharge Instructions (Signed)
Continue to take your medicine as prescribed. Do not drive or operate heavy machinery until cleared by neurology. It is very important that she follow up with neurology for further evaluation of her seizures. Return to the emergency room if you develop fever, chills, chest pain, shortness breath, or any new or worsening symptoms.

## 2017-05-26 ENCOUNTER — Emergency Department (HOSPITAL_COMMUNITY)
Admission: EM | Admit: 2017-05-26 | Discharge: 2017-05-27 | Disposition: A | Discharge status: Home / Self Care | Payer: Medicaid Other | Attending: Emergency Medicine | Admitting: Emergency Medicine

## 2017-05-26 ENCOUNTER — Emergency Department (HOSPITAL_COMMUNITY): Payer: Medicaid Other

## 2017-05-26 ENCOUNTER — Encounter (HOSPITAL_COMMUNITY): Payer: Self-pay

## 2017-05-26 DIAGNOSIS — I2510 Atherosclerotic heart disease of native coronary artery without angina pectoris: Secondary | ICD-10-CM | POA: Insufficient documentation

## 2017-05-26 DIAGNOSIS — R569 Unspecified convulsions: Secondary | ICD-10-CM | POA: Diagnosis present

## 2017-05-26 DIAGNOSIS — F17210 Nicotine dependence, cigarettes, uncomplicated: Secondary | ICD-10-CM | POA: Diagnosis not present

## 2017-05-26 DIAGNOSIS — Z79899 Other long term (current) drug therapy: Secondary | ICD-10-CM | POA: Diagnosis not present

## 2017-05-26 DIAGNOSIS — G40909 Epilepsy, unspecified, not intractable, without status epilepticus: Secondary | ICD-10-CM

## 2017-05-26 DIAGNOSIS — I10 Essential (primary) hypertension: Secondary | ICD-10-CM | POA: Insufficient documentation

## 2017-05-26 LAB — CBC WITH DIFFERENTIAL/PLATELET
BASOS ABS: 0 10*3/uL (ref 0.0–0.1)
BASOS PCT: 0 %
Eosinophils Absolute: 0.3 10*3/uL (ref 0.0–0.7)
Eosinophils Relative: 3 %
HEMATOCRIT: 47.2 % (ref 39.0–52.0)
HEMOGLOBIN: 16.6 g/dL (ref 13.0–17.0)
Lymphocytes Relative: 36 %
Lymphs Abs: 3.9 10*3/uL (ref 0.7–4.0)
MCH: 33.7 pg (ref 26.0–34.0)
MCHC: 35.2 g/dL (ref 30.0–36.0)
MCV: 95.9 fL (ref 78.0–100.0)
Monocytes Absolute: 0.7 10*3/uL (ref 0.1–1.0)
Monocytes Relative: 6 %
NEUTROS ABS: 6 10*3/uL (ref 1.7–7.7)
NEUTROS PCT: 55 %
Platelets: 200 10*3/uL (ref 150–400)
RBC: 4.92 MIL/uL (ref 4.22–5.81)
RDW: 12.8 % (ref 11.5–15.5)
WBC: 10.9 10*3/uL — AB (ref 4.0–10.5)

## 2017-05-26 LAB — COMPREHENSIVE METABOLIC PANEL
ALBUMIN: 4.7 g/dL (ref 3.5–5.0)
ALK PHOS: 68 U/L (ref 38–126)
ALT: 35 U/L (ref 17–63)
AST: 32 U/L (ref 15–41)
Anion gap: 10 (ref 5–15)
BILIRUBIN TOTAL: 0.5 mg/dL (ref 0.3–1.2)
BUN: 14 mg/dL (ref 6–20)
CO2: 20 mmol/L — ABNORMAL LOW (ref 22–32)
Calcium: 9 mg/dL (ref 8.9–10.3)
Chloride: 105 mmol/L (ref 101–111)
Creatinine, Ser: 1.14 mg/dL (ref 0.61–1.24)
GFR calc Af Amer: >60 mL/min (ref >60)
GFR calc non Af Amer: >60 mL/min (ref >60)
GLUCOSE: 88 mg/dL (ref 65–99)
POTASSIUM: 4 mmol/L (ref 3.5–5.1)
Sodium: 135 mmol/L (ref 135–145)
TOTAL PROTEIN: 7.7 g/dL (ref 6.5–8.1)

## 2017-05-26 LAB — LITHIUM LEVEL: LITHIUM LVL: 0.89 mmol/L (ref 0.60–1.20)

## 2017-05-26 MED ORDER — ACETAMINOPHEN 325 MG PO TABS
650.0000 mg | ORAL_TABLET | Freq: Once | ORAL | Status: AC
  Administered 2017-05-26: 650 mg via ORAL
  Filled 2017-05-26: qty 2

## 2017-05-26 MED ORDER — LORAZEPAM 2 MG/ML IJ SOLN
INTRAMUSCULAR | Status: AC
  Filled 2017-05-26: qty 1

## 2017-05-26 NOTE — ED Triage Notes (Signed)
Pt arrives via [facility] after family member witnessed several seizures at home as well as a seizure witnessed by ems.

## 2017-05-26 NOTE — ED Notes (Signed)
I went in to obtain a urine sample from the patient, pt friend standing at the bedside. Pt started looking up at her, started shaking violently in the bed and spit. I held my hand up up against the patient right shoulder to protect him and his head, to which he continued shaking all over and in one motion forcefully threw himself out of the top portion of the bed around the siderail onto the floor. (his head was towards the floor and his hips down remained in the bed.) The pt pillow was under the pt head on the ground. No further signs of trauma noted.  Assisted pt back into bed. Pt immediately was alert, no signs of post ictal symptoms and vitals were stable. Seizure precautions remain in place and Dr. Wyvonnia Dusky made aware of the same and to bedside to assess at this time.
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
## 2017-05-27 LAB — RAPID URINE DRUG SCREEN, HOSP PERFORMED
Amphetamines: NOT DETECTED
BARBITURATES: NOT DETECTED
BENZODIAZEPINES: POSITIVE — AB
COCAINE: NOT DETECTED
Opiates: NOT DETECTED
TETRAHYDROCANNABINOL: POSITIVE — AB

## 2017-05-27 MED ORDER — VALPROATE SODIUM 500 MG/5ML IV SOLN
750.0000 mg | Freq: Once | INTRAVENOUS | Status: AC
  Administered 2017-05-27: 750 mg via INTRAVENOUS
  Filled 2017-05-27: qty 7.5

## 2017-05-27 MED ORDER — DIVALPROEX SODIUM 250 MG PO DR TAB: 750.0000 mg | DELAYED_RELEASE_TABLET | Freq: Two times a day (BID) | ORAL | 0 refills | Status: DC

## 2017-05-27 MED ORDER — VALPROATE SODIUM 500 MG/5ML IV SOLN
INTRAVENOUS | Status: AC
  Filled 2017-05-27: qty 10

## 2017-05-27 NOTE — Discharge Instructions (Signed)
Stop taking keppra. Use depakote instead for your seizures. You should also talk to your doctor about stopping wellbutrin because that can lower your seizure threshold. Stop using marijuana.  Followup with the neurologist. No driving, operating heavy machinery until cleared by the neurologist. Return to the ED if you develop new or worsening symptoms.

## 2017-05-27 NOTE — ED Notes (Signed)
ED Provider at bedside. 

## 2017-05-27 NOTE — ED Notes (Signed)
Patient independently ambulated in the hallway with steady gait, given diet coke for oral trial.

## 2017-05-28 MED FILL — Oxycodone w/ Acetaminophen Tab 5-325 MG: ORAL | Qty: 6 | Status: AC

## 2017-06-08 ENCOUNTER — Encounter (HOSPITAL_COMMUNITY): Payer: Self-pay | Admitting: *Deleted

## 2017-06-08 ENCOUNTER — Emergency Department (HOSPITAL_COMMUNITY)
Admission: EM | Admit: 2017-06-08 | Discharge: 2017-06-09 | Disposition: A | Discharge status: Home / Self Care | Payer: Medicaid Other | Attending: Emergency Medicine | Admitting: Emergency Medicine

## 2017-06-08 DIAGNOSIS — I2510 Atherosclerotic heart disease of native coronary artery without angina pectoris: Secondary | ICD-10-CM | POA: Diagnosis not present

## 2017-06-08 DIAGNOSIS — F17210 Nicotine dependence, cigarettes, uncomplicated: Secondary | ICD-10-CM | POA: Diagnosis not present

## 2017-06-08 DIAGNOSIS — I10 Essential (primary) hypertension: Secondary | ICD-10-CM | POA: Diagnosis not present

## 2017-06-08 DIAGNOSIS — Z79899 Other long term (current) drug therapy: Secondary | ICD-10-CM | POA: Diagnosis not present

## 2017-06-08 DIAGNOSIS — R569 Unspecified convulsions: Secondary | ICD-10-CM

## 2017-06-08 LAB — CBG MONITORING, ED: GLUCOSE-CAPILLARY: 125 mg/dL — AB (ref 65–99)

## 2017-06-08 LAB — CBC
HEMATOCRIT: 43.2 % (ref 39.0–52.0)
HEMOGLOBIN: 15.1 g/dL (ref 13.0–17.0)
MCH: 33.5 pg (ref 26.0–34.0)
MCHC: 35 g/dL (ref 30.0–36.0)
MCV: 95.8 fL (ref 78.0–100.0)
Platelets: 138 10*3/uL — ABNORMAL LOW (ref 150–400)
RBC: 4.51 MIL/uL (ref 4.22–5.81)
RDW: 12.7 % (ref 11.5–15.5)
WBC: 7.1 10*3/uL (ref 4.0–10.5)

## 2017-06-08 LAB — BASIC METABOLIC PANEL
Anion gap: 9 (ref 5–15)
BUN: 10 mg/dL (ref 6–20)
CHLORIDE: 102 mmol/L (ref 101–111)
CO2: 26 mmol/L (ref 22–32)
Calcium: 8.6 mg/dL — ABNORMAL LOW (ref 8.9–10.3)
Creatinine, Ser: 1.14 mg/dL (ref 0.61–1.24)
GFR calc Af Amer: >60 mL/min (ref >60)
GFR calc non Af Amer: >60 mL/min (ref >60)
Glucose, Bld: 121 mg/dL — ABNORMAL HIGH (ref 65–99)
POTASSIUM: 3.4 mmol/L — AB (ref 3.5–5.1)
Sodium: 137 mmol/L (ref 135–145)

## 2017-06-08 LAB — VALPROIC ACID LEVEL: Valproic Acid Lvl: 66 ug/mL (ref 50.0–100.0)

## 2017-06-08 NOTE — ED Triage Notes (Signed)
Pt taking Depakote as prescribed , fell and hit head on floor

## 2017-06-08 NOTE — ED Notes (Signed)
ED Provider at bedside. 

## 2017-06-08 NOTE — ED Notes (Signed)
Confirmed with lab they do have blood for lithium level

## 2017-06-08 NOTE — ED Notes (Signed)
Reported that pt has not followed up yet with neurologist

## 2017-06-09 LAB — LITHIUM LEVEL: LITHIUM LVL: 0.14 mmol/L — AB (ref 0.60–1.20)

## 2017-06-09 MED ORDER — KETOROLAC TROMETHAMINE 30 MG/ML IJ SOLN
30.0000 mg | Freq: Once | INTRAMUSCULAR | Status: AC
  Administered 2017-06-09: 30 mg via INTRAVENOUS
  Filled 2017-06-09: qty 1

## 2017-06-09 MED ORDER — LEVETIRACETAM 500 MG PO TABS: 500.0000 mg | ORAL_TABLET | Freq: Two times a day (BID) | ORAL | 0 refills | Status: DC

## 2017-06-09 NOTE — Discharge Instructions (Signed)
Please be aware you may have another seizure ° °Do not drive until seen by your physician for your condition ° °Do not climb ladders/roofs/trees as a seizure can occur at that height and cause serious harm ° °Do not bathe/swim alone as a seizure can occur and cause serious harm ° °Please followup with your physician or neurologist for further testing and possible treatment ° ° °
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
## 2017-06-27 ENCOUNTER — Encounter (HOSPITAL_COMMUNITY): Payer: Self-pay

## 2017-06-27 ENCOUNTER — Emergency Department (HOSPITAL_COMMUNITY)
Admission: EM | Admit: 2017-06-27 | Discharge: 2017-06-27 | Disposition: A | Discharge status: Home / Self Care | Payer: Medicaid Other | Attending: Emergency Medicine | Admitting: Emergency Medicine

## 2017-06-27 DIAGNOSIS — Z79899 Other long term (current) drug therapy: Secondary | ICD-10-CM | POA: Diagnosis not present

## 2017-06-27 DIAGNOSIS — G40909 Epilepsy, unspecified, not intractable, without status epilepticus: Secondary | ICD-10-CM | POA: Insufficient documentation

## 2017-06-27 DIAGNOSIS — I119 Hypertensive heart disease without heart failure: Secondary | ICD-10-CM | POA: Diagnosis not present

## 2017-06-27 DIAGNOSIS — F17210 Nicotine dependence, cigarettes, uncomplicated: Secondary | ICD-10-CM | POA: Insufficient documentation

## 2017-06-27 DIAGNOSIS — I2510 Atherosclerotic heart disease of native coronary artery without angina pectoris: Secondary | ICD-10-CM | POA: Insufficient documentation

## 2017-06-27 DIAGNOSIS — Z760 Encounter for issue of repeat prescription: Secondary | ICD-10-CM

## 2017-06-27 MED ORDER — DIVALPROEX SODIUM 250 MG PO DR TAB: 500.0000 mg | DELAYED_RELEASE_TABLET | Freq: Three times a day (TID) | ORAL | 0 refills | Status: DC

## 2017-06-27 MED ORDER — LEVETIRACETAM 500 MG PO TABS: 500.0000 mg | ORAL_TABLET | Freq: Two times a day (BID) | ORAL | 0 refills | Status: DC

## 2017-06-27 NOTE — ED Triage Notes (Signed)
Pt requesting refill on depakote and keppra. Pt reports going to health dept and was told to see PCP, went to see Dr Nevada Crane and was told since Dr Nevada Crane is not on his medicaid card he would have to come to ED. Last does taken yesterday. Has appt with neuro on the 28th

## 2017-06-27 NOTE — ED Notes (Signed)
Pt states he needs both seizure medications refilled as the health department and PCP office would not do so. Denies neuro sx, recent seizure, or HA.
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
## 2017-07-12 ENCOUNTER — Telehealth: Payer: Self-pay | Admitting: *Deleted

## 2017-07-12 ENCOUNTER — Ambulatory Visit: Payer: MEDICAID | Admitting: Neurology

## 2017-07-12 NOTE — Telephone Encounter (Signed)
Patient canceled his 10:30 new patient appt at 9:40.  States he misplaced his ID and requested to reschedule.

## 2017-07-13 ENCOUNTER — Encounter: Payer: Self-pay | Admitting: Neurology

## 2017-07-21 ENCOUNTER — Encounter (HOSPITAL_COMMUNITY): Payer: Self-pay | Admitting: Emergency Medicine

## 2017-07-21 ENCOUNTER — Emergency Department (HOSPITAL_COMMUNITY)
Admission: EM | Admit: 2017-07-21 | Discharge: 2017-07-21 | Disposition: A | Discharge status: Home / Self Care | Payer: Medicaid Other | Attending: Emergency Medicine | Admitting: Emergency Medicine

## 2017-07-21 DIAGNOSIS — I10 Essential (primary) hypertension: Secondary | ICD-10-CM

## 2017-07-21 DIAGNOSIS — I2510 Atherosclerotic heart disease of native coronary artery without angina pectoris: Secondary | ICD-10-CM | POA: Insufficient documentation

## 2017-07-21 DIAGNOSIS — G44209 Tension-type headache, unspecified, not intractable: Secondary | ICD-10-CM | POA: Diagnosis not present

## 2017-07-21 DIAGNOSIS — Z79891 Long term (current) use of opiate analgesic: Secondary | ICD-10-CM | POA: Insufficient documentation

## 2017-07-21 DIAGNOSIS — F17210 Nicotine dependence, cigarettes, uncomplicated: Secondary | ICD-10-CM | POA: Insufficient documentation

## 2017-07-21 DIAGNOSIS — R51 Headache: Secondary | ICD-10-CM | POA: Diagnosis present

## 2017-07-21 DIAGNOSIS — Z79899 Other long term (current) drug therapy: Secondary | ICD-10-CM | POA: Insufficient documentation

## 2017-07-21 LAB — CBC WITH DIFFERENTIAL/PLATELET
Basophils Absolute: 0 10*3/uL (ref 0.0–0.1)
Basophils Relative: 0 %
EOS ABS: 0.3 10*3/uL (ref 0.0–0.7)
Eosinophils Relative: 3 %
HEMATOCRIT: 46.7 % (ref 39.0–52.0)
HEMOGLOBIN: 16.4 g/dL (ref 13.0–17.0)
LYMPHS ABS: 1.8 10*3/uL (ref 0.7–4.0)
LYMPHS PCT: 20 %
MCH: 33.6 pg (ref 26.0–34.0)
MCHC: 35.1 g/dL (ref 30.0–36.0)
MCV: 95.7 fL (ref 78.0–100.0)
MONOS PCT: 7 %
Monocytes Absolute: 0.6 10*3/uL (ref 0.1–1.0)
NEUTROS ABS: 6.1 10*3/uL (ref 1.7–7.7)
NEUTROS PCT: 70 %
PLATELETS: 136 10*3/uL — AB (ref 150–400)
RBC: 4.88 MIL/uL (ref 4.22–5.81)
RDW: 12.6 % (ref 11.5–15.5)
WBC: 8.8 10*3/uL (ref 4.0–10.5)

## 2017-07-21 LAB — BASIC METABOLIC PANEL
Anion gap: 10 (ref 5–15)
BUN: 14 mg/dL (ref 6–20)
CHLORIDE: 102 mmol/L (ref 101–111)
CO2: 24 mmol/L (ref 22–32)
CREATININE: 1.01 mg/dL (ref 0.61–1.24)
Calcium: 9.2 mg/dL (ref 8.9–10.3)
GFR calc non Af Amer: >60 mL/min (ref >60)
Glucose, Bld: 92 mg/dL (ref 65–99)
POTASSIUM: 4.4 mmol/L (ref 3.5–5.1)
Sodium: 136 mmol/L (ref 135–145)

## 2017-07-21 MED ORDER — METOPROLOL TARTRATE 50 MG PO TABS: 50.0000 mg | ORAL_TABLET | Freq: Two times a day (BID) | ORAL | 0 refills | Status: DC

## 2017-07-21 MED ORDER — KETOROLAC TROMETHAMINE 30 MG/ML IJ SOLN
30.0000 mg | Freq: Once | INTRAMUSCULAR | Status: AC
  Administered 2017-07-21: 30 mg via INTRAMUSCULAR
  Filled 2017-07-21: qty 1

## 2017-07-21 NOTE — ED Triage Notes (Signed)
Pt reports sent over from PCP office after bp readings of 233/137 and 200/153. Pt reports headache but denies any other symptoms at this time. nad noted.
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
## 2017-07-21 NOTE — Discharge Instructions (Signed)
As we discussed, though you do have high blood pressure (hypertension), fortunately it is not immediately dangerous at this time and does not need emergency intervention or admission to the hospital. I am refilling your Metoprolol 50 mg tablets to take for the next 30 days until your PCP can see you in the office and make any necessary changes. Please follow up in clinic as recommended in these papers.    Return to the Emergency Department (ED) if you experience any worsening chest pain/pressure/tightness, difficulty breathing, or sudden sweating, or other symptoms that concern you.   Hypertension Hypertension, commonly called high blood pressure, is when the force of blood pumping through your arteries is too strong. Your arteries are the blood vessels that carry blood from your heart throughout your body. A blood pressure reading consists of a higher number over a lower number, such as 110/72. The higher number (systolic) is the pressure inside your arteries when your heart pumps. The lower number (diastolic) is the pressure inside your arteries when your heart relaxes. Ideally you want your blood pressure below 120/80. Hypertension forces your heart to work harder to pump blood. Your arteries may become narrow or stiff. Having hypertension puts you at risk for heart disease, stroke, and other problems.  RISK FACTORS Some risk factors for high blood pressure are controllable. Others are not.  Risk factors you cannot control include:  Race. You may be at higher risk if you are African American. Age. Risk increases with age. Gender. Men are at higher risk than women before age 39 years. After age 29, women are at higher risk than men. Risk factors you can control include: Not getting enough exercise or physical activity. Being overweight. Getting too much fat, sugar, calories, or salt in your diet. Drinking too much alcohol. SIGNS AND SYMPTOMS Hypertension does not usually cause signs or  symptoms. Extremely high blood pressure (hypertensive crisis) may cause headache, anxiety, shortness of breath, and nosebleed. DIAGNOSIS  To check if you have hypertension, your health care provider will measure your blood pressure while you are seated, with your arm held at the level of your heart. It should be measured at least twice using the same arm. Certain conditions can cause a difference in blood pressure between your right and left arms. A blood pressure reading that is higher than normal on one occasion does not mean that you need treatment. If one blood pressure reading is high, ask your health care provider about having it checked again. TREATMENT  Treating high blood pressure includes making lifestyle changes and possibly taking medicine. Living a healthy lifestyle can help lower high blood pressure. You may need to change some of your habits. Lifestyle changes may include: Following the DASH diet. This diet is high in fruits, vegetables, and whole grains. It is low in salt, red meat, and added sugars. Getting at least 2 hours of brisk physical activity every week. Losing weight if necessary. Not smoking. Limiting alcoholic beverages. Learning ways to reduce stress.  If lifestyle changes are not enough to get your blood pressure under control, your health care provider may prescribe medicine. You may need to take more than one. Work closely with your health care provider to understand the risks and benefits. HOME CARE INSTRUCTIONS Have your blood pressure rechecked as directed by your health care provider.   Take medicines only as directed by your health care provider. Follow the directions carefully. Blood pressure medicines must be taken as prescribed. The medicine does not work  as well when you skip doses. Skipping doses also puts you at risk for problems.   Do not smoke.   Monitor your blood pressure at home as directed by your health care provider.  SEEK MEDICAL CARE IF:  You  think you are having a reaction to medicines taken. You have recurrent headaches or feel dizzy. You have swelling in your ankles. You have trouble with your vision. SEEK IMMEDIATE MEDICAL CARE IF: You develop a severe headache or confusion. You have unusual weakness, numbness, or feel faint. You have severe chest or abdominal pain. You vomit repeatedly. You have trouble breathing. MAKE SURE YOU:  Understand these instructions. Will watch your condition. Will get help right away if you are not doing well or get worse. Document Released: 09/07/2005 Document Revised: 01/22/2014 Document Reviewed: 06/30/2013 [facility] Patient Information 2015 [place], [place]. This information is not intended to replace advice given to you by your health care provider. Make sure you discuss any questions you have with your health care provider.  How to Take Your Blood Pressure HOW DO I GET A BLOOD PRESSURE MACHINE? You can buy an electronic home blood pressure machine at your local pharmacy. Insurance will sometimes cover the cost if you have a prescription. Ask your doctor what type of machine is best for you. There are different machines for your arm and your wrist. If you decide to buy a machine to check your blood pressure on your arm, first check the size of your arm so you can buy the right size cuff. To check the size of your arm:   Use a measuring tape that shows both inches and centimeters.   Wrap the measuring tape around the upper-middle part of your arm. You may need someone to help you measure.   Write down your arm measurement in both inches and centimeters.   To measure your blood pressure correctly, it is important to have the right size cuff.   If your arm is up to 13 inches (up to 34 centimeters), get an adult cuff size. If your arm is 13 to 17 inches (35 to 44 centimeters), get a large adult cuff size.    If your arm is 17 to 20 inches (45 to 52 centimeters), get an adult thigh cuff.   WHAT  DO THE NUMBERS MEAN?  There are two numbers that make up your blood pressure. For example: 120/80. The first number (120 in our example) is called the "systolic pressure." It is a measure of the pressure in your blood vessels when your heart is pumping blood. The second number (80 in our example) is called the "diastolic pressure." It is a measure of the pressure in your blood vessels when your heart is resting between beats. Your doctor will tell you what your blood pressure should be. WHAT SHOULD I DO BEFORE I CHECK MY BLOOD PRESSURE?  Try to rest or relax for at least 30 minutes before you check your blood pressure. Do not smoke. Do not have any drinks with caffeine, such as: Soda. Coffee. Tea. Check your blood pressure in a quiet room. Sit down and stretch out your arm on a table. Keep your arm at about the level of your heart. Let your arm relax. Make sure that your legs are not crossed. HOW DO I CHECK MY BLOOD PRESSURE? Follow the directions that came with your machine. Make sure you remove any tight-fighting clothing from your arm or wrist. Wrap the cuff around your upper arm or wrist.  You should be able to fit a finger between the cuff and your arm. If you cannot fit a finger between the cuff and your arm, it is too tight and should be removed and rewrapped. Some units require you to manually pump up the arm cuff. Automatic units inflate the cuff when you press a button. Cuff deflation is automatic in both models. After the cuff is inflated, the unit measures your blood pressure and pulse. The readings are shown on a monitor. Hold still and breathe normally while the cuff is inflated. Getting a reading takes less than a minute. Some models store readings in a memory. Some provide a printout of readings. If your machine does not store your readings, keep a written record. Take readings with you to your next visit with your doctor. Document Released: 08/20/2008 Document Revised:  01/22/2014 Document Reviewed: 11/02/2013 [facility] Patient Information 2015 [place], [place]. This information is not intended to replace advice given to you by your health care provider. Make sure you discuss any questions you have with your health care provider.

## 2017-07-21 NOTE — ED Notes (Signed)
Pt self-reports having aggressive seizures. Pt reports last seizure he remembers was apprx. 3 weeks ago. Pt has a headache, and BP has dropped down to normal from what was stated earlier. BP now is 137/94.

## 2017-08-29 ENCOUNTER — Telehealth: Payer: Self-pay | Admitting: *Deleted

## 2017-08-29 NOTE — Telephone Encounter (Signed)
I attempted to reach the patient but was unable to complete the call due to my number being blocked (calling from my personal phone at home).  I called Jessica Martinique on his emergency contact list and left a message that his appointment had to be canceled due to inclement weather.  I provided our number to call back to get a new appointment time.  He is a new patient to our office and can be scheduled with the first available, appropriate provider.

## 2017-08-30 ENCOUNTER — Ambulatory Visit: Payer: MEDICAID | Admitting: Neurology

## 2017-09-01 ENCOUNTER — Ambulatory Visit: Payer: MEDICAID | Admitting: Neurology

## 2017-09-23 ENCOUNTER — Emergency Department (HOSPITAL_COMMUNITY): Payer: Medicaid Other

## 2017-09-23 ENCOUNTER — Encounter (HOSPITAL_COMMUNITY): Payer: Self-pay | Admitting: Emergency Medicine

## 2017-09-23 ENCOUNTER — Emergency Department (HOSPITAL_COMMUNITY)
Admission: EM | Admit: 2017-09-23 | Discharge: 2017-09-23 | Disposition: A | Discharge status: Home / Self Care | Payer: Medicaid Other | Attending: Emergency Medicine | Admitting: Emergency Medicine

## 2017-09-23 DIAGNOSIS — I10 Essential (primary) hypertension: Secondary | ICD-10-CM | POA: Insufficient documentation

## 2017-09-23 DIAGNOSIS — I2510 Atherosclerotic heart disease of native coronary artery without angina pectoris: Secondary | ICD-10-CM | POA: Diagnosis not present

## 2017-09-23 DIAGNOSIS — F17210 Nicotine dependence, cigarettes, uncomplicated: Secondary | ICD-10-CM | POA: Insufficient documentation

## 2017-09-23 DIAGNOSIS — R112 Nausea with vomiting, unspecified: Secondary | ICD-10-CM | POA: Diagnosis not present

## 2017-09-23 DIAGNOSIS — K0889 Other specified disorders of teeth and supporting structures: Secondary | ICD-10-CM

## 2017-09-23 LAB — CBC WITH DIFFERENTIAL/PLATELET
BASOS ABS: 0 10*3/uL (ref 0.0–0.1)
BASOS PCT: 0 %
EOS ABS: 0.2 10*3/uL (ref 0.0–0.7)
Eosinophils Relative: 1 %
HEMATOCRIT: 48.6 % (ref 39.0–52.0)
HEMOGLOBIN: 16.3 g/dL (ref 13.0–17.0)
Lymphocytes Relative: 9 %
Lymphs Abs: 1.7 10*3/uL (ref 0.7–4.0)
MCH: 33 pg (ref 26.0–34.0)
MCHC: 33.5 g/dL (ref 30.0–36.0)
MCV: 98.4 fL (ref 78.0–100.0)
Monocytes Absolute: 1.2 10*3/uL — ABNORMAL HIGH (ref 0.1–1.0)
Monocytes Relative: 7 %
NEUTROS ABS: 15.1 10*3/uL — AB (ref 1.7–7.7)
NEUTROS PCT: 83 %
Platelets: 153 10*3/uL (ref 150–400)
RBC: 4.94 MIL/uL (ref 4.22–5.81)
RDW: 12.6 % (ref 11.5–15.5)
WBC: 18.2 10*3/uL — AB (ref 4.0–10.5)

## 2017-09-23 LAB — COMPREHENSIVE METABOLIC PANEL
ALBUMIN: 3.9 g/dL (ref 3.5–5.0)
ALK PHOS: 49 U/L (ref 38–126)
ALT: 33 U/L (ref 17–63)
AST: 25 U/L (ref 15–41)
Anion gap: 10 (ref 5–15)
BUN: 14 mg/dL (ref 6–20)
CALCIUM: 8.7 mg/dL — AB (ref 8.9–10.3)
CO2: 25 mmol/L (ref 22–32)
Chloride: 103 mmol/L (ref 101–111)
Creatinine, Ser: 1.2 mg/dL (ref 0.61–1.24)
GFR calc Af Amer: >60 mL/min (ref >60)
GFR calc non Af Amer: >60 mL/min (ref >60)
GLUCOSE: 94 mg/dL (ref 65–99)
Potassium: 4.3 mmol/L (ref 3.5–5.1)
SODIUM: 138 mmol/L (ref 135–145)
TOTAL PROTEIN: 6.7 g/dL (ref 6.5–8.1)
Total Bilirubin: 0.7 mg/dL (ref 0.3–1.2)

## 2017-09-23 LAB — LIPASE, BLOOD: Lipase: 34 U/L (ref 11–51)

## 2017-09-23 MED ORDER — TRAMADOL HCL 50 MG PO TABS: 50.0000 mg | ORAL_TABLET | Freq: Four times a day (QID) | ORAL | 0 refills | Status: DC | PRN

## 2017-09-23 MED ORDER — CLINDAMYCIN HCL 150 MG PO CAPS: 450.0000 mg | ORAL_CAPSULE | Freq: Three times a day (TID) | ORAL | 0 refills | Status: AC

## 2017-09-23 MED ORDER — TRAMADOL HCL 50 MG PO TABS
50.0000 mg | ORAL_TABLET | Freq: Once | ORAL | Status: AC
  Administered 2017-09-23: 50 mg via ORAL
  Filled 2017-09-23: qty 1

## 2017-09-23 MED ORDER — CLINDAMYCIN PHOSPHATE 600 MG/50ML IV SOLN
600.0000 mg | Freq: Once | INTRAVENOUS | Status: AC
  Administered 2017-09-23: 600 mg via INTRAVENOUS
  Filled 2017-09-23: qty 50

## 2017-09-23 MED ORDER — KETOROLAC TROMETHAMINE 30 MG/ML IJ SOLN
30.0000 mg | Freq: Once | INTRAMUSCULAR | Status: AC
  Administered 2017-09-23: 30 mg via INTRAVENOUS
  Filled 2017-09-23: qty 1

## 2017-09-23 MED ORDER — METOCLOPRAMIDE HCL 5 MG/ML IJ SOLN
10.0000 mg | Freq: Once | INTRAMUSCULAR | Status: AC
  Administered 2017-09-23: 10 mg via INTRAVENOUS
  Filled 2017-09-23: qty 2

## 2017-09-23 MED ORDER — SODIUM CHLORIDE 0.9 % IV BOLUS (SEPSIS)
1000.0000 mL | Freq: Once | INTRAVENOUS | Status: AC
  Administered 2017-09-23: 1000 mL via INTRAVENOUS

## 2017-09-23 MED ORDER — ONDANSETRON HCL 4 MG/2ML IJ SOLN
4.0000 mg | Freq: Once | INTRAMUSCULAR | Status: AC
  Administered 2017-09-23: 4 mg via INTRAVENOUS
  Filled 2017-09-23: qty 2

## 2017-09-23 MED ORDER — ONDANSETRON 4 MG PO TBDP: 4.0000 mg | ORAL_TABLET | Freq: Three times a day (TID) | ORAL | 0 refills | Status: DC | PRN

## 2017-09-23 NOTE — ED Triage Notes (Signed)
Patient states he has abscessed tooth and was given numbing shots and augmentin yesterday. States he has been vomiting since the shots and Dr Valetta Close called today and spoke with Dr Laverta Baltimore requesting IV antibiotics.
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
## 2017-09-23 NOTE — Discharge Instructions (Signed)
You have been seen in the Emergency Department (ED) today for nausea and vomiting.  Your work up today has not shown a clear cause for your symptoms. You have been prescribed Zofran; please use as prescribed as needed for your nausea.  Take the Clindamycin instead of the Augmentin and follow up with your PCP and Dentist by pone tomorrow.   Follow up with your doctor as soon as possible regarding today?s emergent visit and your symptoms of nausea.   Return to the ED if you develop abdominal, bloody vomiting, bloody diarrhea, if you are unable to tolerate fluids due to vomiting, or if you develop other symptoms that concern you.

## 2017-10-28 ENCOUNTER — Ambulatory Visit: Payer: Medicaid Other | Admitting: Neurology

## 2017-10-28 ENCOUNTER — Encounter: Payer: Self-pay | Admitting: Neurology

## 2017-10-28 VITALS — Ht 74.0 in | Wt 212.0 lb

## 2017-10-28 DIAGNOSIS — R569 Unspecified convulsions: Secondary | ICD-10-CM | POA: Diagnosis not present

## 2017-10-28 DIAGNOSIS — F39 Unspecified mood [affective] disorder: Secondary | ICD-10-CM | POA: Diagnosis not present

## 2017-10-28 MED ORDER — LAMOTRIGINE 25 MG PO TABS: 25.0000 mg | ORAL_TABLET | Freq: Every day | ORAL | 0 refills | Status: DC

## 2017-10-28 MED ORDER — LAMOTRIGINE 100 MG PO TABS: 100.0000 mg | ORAL_TABLET | Freq: Two times a day (BID) | ORAL | 11 refills | Status: DC
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
## 2017-10-29 ENCOUNTER — Telehealth: Payer: Self-pay | Admitting: *Deleted

## 2017-10-29 LAB — TSH: TSH: 1.57 u[IU]/mL (ref 0.450–4.500)

## 2017-10-29 LAB — RPR: RPR Ser Ql: NONREACTIVE

## 2017-10-29 LAB — VITAMIN B12: Vitamin B-12: 567 pg/mL (ref 232–1245)

## 2017-10-29 LAB — C-REACTIVE PROTEIN: CRP: 1 mg/L (ref 0.0–4.9)

## 2017-10-29 LAB — SEDIMENTATION RATE: SED RATE: 4 mm/h (ref 0–15)

## 2017-10-29 LAB — HIV ANTIBODY (ROUTINE TESTING W REFLEX): HIV Screen 4th Generation wRfx: NONREACTIVE

## 2017-10-29 NOTE — Telephone Encounter (Signed)
-----   Message from Marcial Pacas, MD sent at 10/29/2017 10:35 AM EST ----- Please call patient for normal laboratory result

## 2017-10-29 NOTE — Telephone Encounter (Signed)
Spoke with [place] and reviewed below lab results.  He verbalized understanding of same/fim

## 2017-11-14 ENCOUNTER — Encounter (HOSPITAL_COMMUNITY): Payer: Self-pay | Admitting: Emergency Medicine

## 2017-11-14 ENCOUNTER — Emergency Department (HOSPITAL_COMMUNITY): Payer: Medicaid Other

## 2017-11-14 ENCOUNTER — Emergency Department (HOSPITAL_COMMUNITY)
Admission: EM | Admit: 2017-11-14 | Discharge: 2017-11-14 | Disposition: A | Discharge status: Home / Self Care | Payer: Medicaid Other | Attending: Emergency Medicine | Admitting: Emergency Medicine

## 2017-11-14 ENCOUNTER — Other Ambulatory Visit: Payer: Self-pay

## 2017-11-14 DIAGNOSIS — R112 Nausea with vomiting, unspecified: Secondary | ICD-10-CM | POA: Diagnosis present

## 2017-11-14 DIAGNOSIS — R1084 Generalized abdominal pain: Secondary | ICD-10-CM | POA: Insufficient documentation

## 2017-11-14 DIAGNOSIS — I2510 Atherosclerotic heart disease of native coronary artery without angina pectoris: Secondary | ICD-10-CM | POA: Insufficient documentation

## 2017-11-14 DIAGNOSIS — R1111 Vomiting without nausea: Secondary | ICD-10-CM | POA: Insufficient documentation

## 2017-11-14 DIAGNOSIS — Z79899 Other long term (current) drug therapy: Secondary | ICD-10-CM | POA: Diagnosis not present

## 2017-11-14 DIAGNOSIS — I10 Essential (primary) hypertension: Secondary | ICD-10-CM | POA: Insufficient documentation

## 2017-11-14 DIAGNOSIS — F17210 Nicotine dependence, cigarettes, uncomplicated: Secondary | ICD-10-CM | POA: Diagnosis not present

## 2017-11-14 LAB — URINALYSIS, ROUTINE W REFLEX MICROSCOPIC
BILIRUBIN URINE: NEGATIVE
Glucose, UA: NEGATIVE mg/dL
HGB URINE DIPSTICK: NEGATIVE
Ketones, ur: 20 mg/dL — AB
Leukocytes, UA: NEGATIVE
NITRITE: NEGATIVE
PROTEIN: NEGATIVE mg/dL
SPECIFIC GRAVITY, URINE: 1.008 (ref 1.005–1.030)
pH: 8 (ref 5.0–8.0)

## 2017-11-14 LAB — LITHIUM LEVEL: LITHIUM LVL: 0.49 mmol/L — AB (ref 0.60–1.20)

## 2017-11-14 LAB — COMPREHENSIVE METABOLIC PANEL
ALK PHOS: 63 U/L (ref 38–126)
ALT: 24 U/L (ref 17–63)
ANION GAP: 13 (ref 5–15)
AST: 28 U/L (ref 15–41)
Albumin: 4.7 g/dL (ref 3.5–5.0)
BILIRUBIN TOTAL: 0.6 mg/dL (ref 0.3–1.2)
BUN: 12 mg/dL (ref 6–20)
CALCIUM: 9.8 mg/dL (ref 8.9–10.3)
CO2: 28 mmol/L (ref 22–32)
CREATININE: 1.21 mg/dL (ref 0.61–1.24)
Chloride: 96 mmol/L — ABNORMAL LOW (ref 101–111)
GFR calc non Af Amer: >60 mL/min (ref >60)
Glucose, Bld: 91 mg/dL (ref 65–99)
Potassium: 3.8 mmol/L (ref 3.5–5.1)
Sodium: 137 mmol/L (ref 135–145)
Total Protein: 8.3 g/dL — ABNORMAL HIGH (ref 6.5–8.1)

## 2017-11-14 LAB — LIPASE, BLOOD: Lipase: 25 U/L (ref 11–51)

## 2017-11-14 LAB — CBC
HCT: 46.5 % (ref 39.0–52.0)
HEMOGLOBIN: 16.1 g/dL (ref 13.0–17.0)
MCH: 33 pg (ref 26.0–34.0)
MCHC: 34.6 g/dL (ref 30.0–36.0)
MCV: 95.3 fL (ref 78.0–100.0)
PLATELETS: 156 10*3/uL (ref 150–400)
RBC: 4.88 MIL/uL (ref 4.22–5.81)
RDW: 12.4 % (ref 11.5–15.5)
WBC: 6.3 10*3/uL (ref 4.0–10.5)

## 2017-11-14 MED ORDER — PROMETHAZINE HCL 12.5 MG PO TABS
25.0000 mg | ORAL_TABLET | ORAL | Status: AC
  Administered 2017-11-14: 25 mg via ORAL
  Filled 2017-11-14: qty 2

## 2017-11-14 MED ORDER — IOPAMIDOL (ISOVUE-300) INJECTION 61%
100.0000 mL | Freq: Once | INTRAVENOUS | Status: AC | PRN
  Administered 2017-11-14: 100 mL via INTRAVENOUS

## 2017-11-14 MED ORDER — PROMETHAZINE HCL 25 MG/ML IJ SOLN
25.0000 mg | Freq: Once | INTRAMUSCULAR | Status: AC
  Administered 2017-11-14: 25 mg via INTRAVENOUS
  Filled 2017-11-14: qty 1

## 2017-11-14 MED ORDER — PROMETHAZINE HCL 25 MG RE SUPP: 25.0000 mg | Freq: Four times a day (QID) | RECTAL | 0 refills | Status: DC | PRN

## 2017-11-14 MED ORDER — SODIUM CHLORIDE 0.9 % IV BOLUS (SEPSIS)
1000.0000 mL | Freq: Once | INTRAVENOUS | Status: AC
Start: 2017-11-14 — End: 2017-11-14
  Administered 2017-11-14: 1000 mL via INTRAVENOUS

## 2017-11-14 MED ORDER — PROMETHAZINE HCL 25 MG PO TABS: 25.0000 mg | ORAL_TABLET | Freq: Four times a day (QID) | ORAL | 0 refills | Status: DC | PRN

## 2017-11-14 NOTE — Discharge Instructions (Addendum)
Please obtain all of your results from medical records or have your doctors office obtain the results - share them with your doctor - you should be seen at your doctors office in the next 2 days. Call today to arrange your follow up. Take the medications as prescribed. Please review all of the medicines and only take them if you do not have an allergy to them. Please be aware that if you are taking birth control pills, taking other prescriptions, ESPECIALLY ANTIBIOTICS may make the birth control ineffective - if this is the case, either do not engage in sexual activity or use alternative methods of birth control such as condoms until you have finished the medicine and your family doctor says it is OK to restart them. If you are on a blood thinner such as COUMADIN, be aware that any other medicine that you take may cause the coumadin to either work too much, or not enough - you should have your coumadin level rechecked in next 7 days if this is the case.  ?  It is also a possibility that you have an allergic reaction to any of the medicines that you have been prescribed - Everybody reacts differently to medications and while MOST people have no trouble with most medicines, you may have a reaction such as nausea, vomiting, rash, swelling, shortness of breath. If this is the case, please stop taking the medicine immediately and contact your physician.  ?  You should return to the ER if you develop severe or worsening symptoms.    Drink 1 bottle of magnesium citrate tomorrow morning Please take MiraLAX, 1 scoop in a glass of liquid twice daily for the next 10 days or until you are having daily soft large volume stools Please take Colace twice a day Please take Senokot twice a day until having regular daily soft stools
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
## 2017-11-14 NOTE — ED Notes (Signed)
Pt denies nausea at this time.

## 2017-11-14 NOTE — ED Triage Notes (Signed)
Pt reports he has had N/V/D/ X1 month, states he has lost 50lb in the last month without trying. Has had bloody stool and emesis.

## 2017-11-18 ENCOUNTER — Ambulatory Visit
Admission: RE | Admit: 2017-11-18 | Discharge: 2017-11-18 | Disposition: A | Discharge status: Home / Self Care | Payer: Medicaid Other | Source: Ambulatory Visit | Attending: Neurology | Admitting: Neurology

## 2017-11-18 ENCOUNTER — Telehealth: Payer: Self-pay | Admitting: Gastroenterology

## 2017-11-18 ENCOUNTER — Other Ambulatory Visit: Payer: MEDICAID

## 2017-11-18 DIAGNOSIS — R569 Unspecified convulsions: Secondary | ICD-10-CM

## 2017-11-18 DIAGNOSIS — F39 Unspecified mood [affective] disorder: Secondary | ICD-10-CM

## 2017-11-18 MED ORDER — GADOBENATE DIMEGLUMINE 529 MG/ML IV SOLN
20.0000 mL | Freq: Once | INTRAVENOUS | Status: AC | PRN
  Administered 2017-11-18: 20 mL via INTRAVENOUS

## 2017-11-18 NOTE — Telephone Encounter (Signed)
Pt was seen in the ER and recommended to follow up with us. Can we accept him as a new patient? 

## 2017-11-19 ENCOUNTER — Telehealth: Payer: Self-pay | Admitting: *Deleted

## 2017-11-19 NOTE — Telephone Encounter (Signed)
Spoke with Jermaine [name] and per YY, advised MRI brain is normal.  He verbalized understanding of same/fim

## 2017-11-19 NOTE — Telephone Encounter (Signed)
-----   Message from Marcial Pacas, MD sent at 11/19/2017 11:52 AM EST ----- Please call pt for normal MRI brain.

## 2017-11-19 NOTE — Telephone Encounter (Signed)
Yes

## 2017-11-19 NOTE — Telephone Encounter (Signed)
OV made and pt is aware °

## 2017-11-23 ENCOUNTER — Telehealth: Payer: Self-pay

## 2017-11-23 ENCOUNTER — Encounter: Payer: Self-pay | Admitting: Gastroenterology

## 2017-11-23 ENCOUNTER — Other Ambulatory Visit: Payer: Self-pay

## 2017-11-23 ENCOUNTER — Ambulatory Visit: Payer: Medicaid Other | Admitting: Gastroenterology

## 2017-11-23 DIAGNOSIS — K625 Hemorrhage of anus and rectum: Secondary | ICD-10-CM | POA: Diagnosis not present

## 2017-11-23 DIAGNOSIS — K219 Gastro-esophageal reflux disease without esophagitis: Secondary | ICD-10-CM | POA: Insufficient documentation

## 2017-11-23 DIAGNOSIS — R112 Nausea with vomiting, unspecified: Secondary | ICD-10-CM | POA: Diagnosis not present

## 2017-11-23 DIAGNOSIS — K5900 Constipation, unspecified: Secondary | ICD-10-CM | POA: Insufficient documentation

## 2017-11-23 DIAGNOSIS — R634 Abnormal weight loss: Secondary | ICD-10-CM | POA: Insufficient documentation

## 2017-11-23 MED ORDER — CLENPIQ 10-3.5-12 MG-GM -GM/160ML PO SOLN: 1.0000 | Freq: Once | ORAL | 0 refills | Status: AC

## 2017-11-23 MED ORDER — LUBIPROSTONE 24 MCG PO CAPS: 24.0000 ug | ORAL_CAPSULE | Freq: Two times a day (BID) | ORAL | 3 refills | Status: DC
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
## 2017-11-23 NOTE — Patient Instructions (Signed)
1. Continue omeprazole minutes before breakfast daily. If you continue to have severe heartburn, please call me and we will adjust your medication. 2. Start Amitiza 63mcg twice daily with food for constipation. If your bowel function does not improve over the next 24 to 48 hours, please let me know so we can adjust your medication. It is very important that we correct your constipation issues prior to your colonoscopy so that you will have a good bowel prep. 3. Colonoscopy and upper endoscopy as scheduled. Please see separate instructions.

## 2017-11-23 NOTE — Telephone Encounter (Signed)
Called and informed pt of pre-op appt 12/21/17 at 9:00am. Letter mailed.

## 2017-11-25 DIAGNOSIS — Z0271 Encounter for disability determination: Secondary | ICD-10-CM

## 2017-11-26 NOTE — Assessment & Plan Note (Signed)
37 year old gentleman with frequent nausea/vomiting, upper abdominal pain, reflux who presents for evaluation. He has had documented 30 pounds weight loss in the past two months. Chronic daily marijuana use. On Suboxone. Denies NSAIDs. Need to rule out gastritis, complicated Gerd, peptic ulcer disease. Cannot exclude hyperemesis cannabinoid syndrome.  EGD in the near future with deep sedation.  I have discussed the risks, alternatives, benefits with regards to but not limited to the risk of reaction to medication, bleeding, infection, perforation and the patient is agreeable to proceed. Written consent to be obtained.  Continue omeprazole for now since he just recently started it. May have to switch PPIs if he does not get good control of symptoms.

## 2017-11-26 NOTE — Assessment & Plan Note (Signed)
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 Intermittent rectal bleeding. May be secondary to benign anorectal source given constipation. He has a 30 pound weight loss in the past two months. Malignancy is less likely. Recommend colonoscopy with deep sedation in the near future.  I have discussed the risks, alternatives, benefits with regards to but not limited to the risk of reaction to medication, bleeding, infection, perforation and the patient is agreeable to proceed. Written consent to be obtained.  Start Amitiza 24 g twice daily with food. He has been advised that if his bowel function does not improve we will need to know so that we can address prior to upcoming colonoscopy otherwise he would be at risk for an adequate bowel prep.

## 2017-11-29 NOTE — Progress Notes (Signed)
CC'D TO PCP °

## 2017-12-09 ENCOUNTER — Ambulatory Visit: Payer: Medicaid Other | Admitting: Neurology

## 2017-12-09 DIAGNOSIS — F39 Unspecified mood [affective] disorder: Secondary | ICD-10-CM

## 2017-12-09 DIAGNOSIS — R569 Unspecified convulsions: Secondary | ICD-10-CM | POA: Diagnosis not present

## 2017-12-15 NOTE — Patient Instructions (Signed)
Jermaine [name]  12/15/2017     @PREFPERIOPPHARMACY @   Your procedure is scheduled on  12/27/2017 .  Report to [facility] at  700  A.M.  Call this number if you have problems the morning of surgery:  [phone]   Remember:  Do not eat food or drink liquids after midnight.  Take these medicines the morning of surgery with A SIP OF WATER  Catapress, depakote, keppra, lithium, metoprolol, zofran or phenergan, suboxone. Use your inhaler before you come.   Do not wear jewelry, make-up or nail polish.  Do not wear lotions, powders, or perfumes, or deodorant.  Do not shave 48 hours prior to surgery.  Men may shave face and neck.  Do not bring valuables to the hospital.  [facility] is not responsible for any belongings or valuables.  Contacts, dentures or bridgework may not be worn into surgery.  Leave your suitcase in the car.  After surgery it may be brought to your room.  For patients admitted to the hospital, discharge time will be determined by your treatment team.  Patients discharged the day of surgery will not be allowed to drive home.   Name and phone number of your driver:   family Special instructions:  Follow the diet and prep instructions given to you by Dr Roseanne Kaufman office.  Please read over the following fact sheets that you were given. Anesthesia Post-op Instructions and Care and Recovery After Surgery       Esophagogastroduodenoscopy Esophagogastroduodenoscopy (EGD) is a procedure to examine the lining of the esophagus, stomach, and first part of the small intestine (duodenum). This procedure is done to check for problems such as inflammation, bleeding, ulcers, or growths. During this procedure, a long, flexible, lighted tube with a camera attached (endoscope) is inserted down the throat. Tell a health care provider about:  Any allergies you have.  All medicines you are taking, including vitamins, herbs, eye drops, creams, and  over-the-counter medicines.  Any problems you or family members have had with anesthetic medicines.  Any blood disorders you have.  Any surgeries you have had.  Any medical conditions you have.  Whether you are pregnant or may be pregnant. What are the risks? Generally, this is a safe procedure. However, problems may occur, including:  Infection.  Bleeding.  A tear (perforation) in the esophagus, stomach, or duodenum.  Trouble breathing.  Excessive sweating.  Spasms of the larynx.  A slowed heartbeat.  Low blood pressure.  What happens before the procedure?  Follow instructions from your health care provider about eating or drinking restrictions.  Ask your health care provider about: ? Changing or stopping your regular medicines. This is especially important if you are taking diabetes medicines or blood thinners. ? Taking medicines such as aspirin and ibuprofen. These medicines can thin your blood. Do not take these medicines before your procedure if your health care provider instructs you not to.  Plan to have someone take you home after the procedure.  If you wear dentures, be ready to remove them before the procedure. What happens during the procedure?  To reduce your risk of infection, your health care team will wash or sanitize their hands.  An IV tube will be put in a vein in your hand or arm. You will get medicines and fluids through this tube.  You will be given one or more of the following: ? A medicine to help  you relax (sedative). ? A medicine to numb the area (local anesthetic). This medicine may be sprayed into your throat. It will make you feel more comfortable and keep you from gagging or coughing during the procedure. ? A medicine for pain.  A mouth guard may be placed in your mouth to protect your teeth and to keep you from biting on the endoscope.  You will be asked to lie on your left side.  The endoscope will be lowered down your throat into  your esophagus, stomach, and duodenum.  Air will be put into the endoscope. This will help your health care provider see better.  The lining of your esophagus, stomach, and duodenum will be examined.  Your health care provider may: ? Take a tissue sample so it can be looked at in a lab (biopsy). ? Remove growths. ? Remove objects (foreign bodies) that are stuck. ? Treat any bleeding with medicines or other devices that stop tissue from bleeding. ? Widen (dilate) or stretch narrowed areas of your esophagus and stomach.  The endoscope will be taken out. The procedure may vary among health care providers and hospitals. What happens after the procedure?  Your blood pressure, heart rate, breathing rate, and blood oxygen level will be monitored often until the medicines you were given have worn off.  Do not eat or drink anything until the numbing medicine has worn off and your gag reflex has returned. This information is not intended to replace advice given to you by your health care provider. Make sure you discuss any questions you have with your health care provider. Document Released: 01/08/2005 Document Revised: 02/13/2016 Document Reviewed: 08/01/2015 Elsevier Interactive Patient Education  2018 [employer]. Esophagogastroduodenoscopy, Care After Refer to this sheet in the next few weeks. These instructions provide you with information about caring for yourself after your procedure. Your health care provider may also give you more specific instructions. Your treatment has been planned according to current medical practices, but problems sometimes occur. Call your health care provider if you have any problems or questions after your procedure. What can I expect after the procedure? After the procedure, it is common to have:  A sore throat.  Nausea.  Bloating.  Dizziness.  Fatigue.  Follow these instructions at home:  Do not eat or drink anything until the numbing medicine  (local anesthetic) has worn off and your gag reflex has returned. You will know that the local anesthetic has worn off when you can swallow comfortably.  Do not drive for 24 hours if you received a medicine to help you relax (sedative).  If your health care provider took a tissue sample for testing during the procedure, make sure to get your test results. This is your responsibility. Ask your health care provider or the department performing the test when your results will be ready.  Keep all follow-up visits as told by your health care provider. This is important. Contact a health care provider if:  You cannot stop coughing.  You are not urinating.  You are urinating less than usual. Get help right away if:  You have trouble swallowing.  You cannot eat or drink.  You have throat or chest pain that gets worse.  You are dizzy or light-headed.  You faint.  You have nausea or vomiting.  You have chills.  You have a fever.  You have severe abdominal pain.  You have black, tarry, or bloody stools. This information is not intended to replace advice given to  you by your health care provider. Make sure you discuss any questions you have with your health care provider. Document Released: 08/24/2012 Document Revised: 02/13/2016 Document Reviewed: 08/01/2015 Elsevier Interactive Patient Education  2018 [employer].  Colonoscopy, Adult A colonoscopy is an exam to look at the large intestine. It is done to check for problems, such as:  Lumps (tumors).  Growths (polyps).  Swelling (inflammation).  Bleeding.  What happens before the procedure? Eating and drinking Follow instructions from your doctor about eating and drinking. These instructions may include:  A few days before the procedure - follow a low-fiber diet. ? Avoid nuts. ? Avoid seeds. ? Avoid dried fruit. ? Avoid raw fruits. ? Avoid vegetables.  1-3 days before the procedure - follow a clear liquid diet.  Avoid liquids that have red or purple dye. Drink only clear liquids, such as: ? Clear broth or bouillon. ? Black coffee or tea. ? Clear juice. ? Clear soft drinks or sports drinks. ? Gelatin dessert. ? Popsicles.  On the day of the procedure - do not eat or drink anything during the 2 hours before the procedure.  Bowel prep If you were prescribed an oral bowel prep:  Take it as told by your doctor. Starting the day before your procedure, you will need to drink a lot of liquid. The liquid will cause you to poop (have bowel movements) until your poop is almost clear or light green.  If your skin or butt gets irritated from diarrhea, you may: ? Wipe the area with wipes that have medicine in them, such as adult wet wipes with aloe and vitamin E. ? Put something on your skin that soothes the area, such as petroleum jelly.  If you throw up (vomit) while drinking the bowel prep, take a break for up to 60 minutes. Then begin the bowel prep again. If you keep throwing up and you cannot take the bowel prep without throwing up, call your doctor.  General instructions  Ask your doctor about changing or stopping your normal medicines. This is important if you take diabetes medicines or blood thinners.  Plan to have someone take you home from the hospital or clinic. What happens during the procedure?  An IV tube may be put into one of your veins.  You will be given medicine to help you relax (sedative).  To reduce your risk of infection: ? Your doctors will wash their hands. ? Your anal area will be washed with soap.  You will be asked to lie on your side with your knees bent.  Your doctor will get a long, thin, flexible tube ready. The tube will have a camera and a light on the end.  The tube will be put into your anus.  The tube will be gently put into your large intestine.  Air will be delivered into your large intestine to keep it open. You may feel some pressure or cramping.  The  camera will be used to take photos.  A small tissue sample may be removed from your body to be looked at under a microscope (biopsy). If any possible problems are found, the tissue will be sent to a lab for testing.  If small growths are found, your doctor may remove them and have them checked for cancer.  The tube that was put into your anus will be slowly removed. The procedure may vary among doctors and hospitals. What happens after the procedure?  Your doctor will check on you often until  the medicines you were given have worn off.  Do not drive for 24 hours after the procedure.  You may have a small amount of blood in your poop.  You may pass gas.  You may have mild cramps or bloating in your belly (abdomen).  It is up to you to get the results of your procedure. Ask your doctor, or the department performing the procedure, when your results will be ready. This information is not intended to replace advice given to you by your health care provider. Make sure you discuss any questions you have with your health care provider. Document Released: 10/10/2010 Document Revised: 07/08/2016 Document Reviewed: 11/19/2015 Elsevier Interactive Patient Education  2017 [employer]  Colonoscopy, Adult, Care After This sheet gives you information about how to care for yourself after your procedure. Your health care provider may also give you more specific instructions. If you have problems or questions, contact your health care provider. What can I expect after the procedure? After the procedure, it is common to have:  A small amount of blood in your stool for 24 hours after the procedure.  Some gas.  Mild abdominal cramping or bloating.  Follow these instructions at home: General instructions   For the first 24 hours after the procedure: ? Do not drive or use machinery. ? Do not sign important documents. ? Do not drink alcohol. ? Do your regular daily activities at a slower pace  than normal. ? Eat soft, easy-to-digest foods. ? Rest often.  Take over-the-counter or prescription medicines only as told by your health care provider.  It is up to you to get the results of your procedure. Ask your health care provider, or the department performing the procedure, when your results will be ready. Relieving cramping and bloating  Try walking around when you have cramps or feel bloated.  Apply heat to your abdomen as told by your health care provider. Use a heat source that your health care provider recommends, such as a moist heat pack or a heating pad. ? Place a towel between your skin and the heat source. ? Leave the heat on for 20-30 minutes. ? Remove the heat if your skin turns bright red. This is especially important if you are unable to feel pain, heat, or cold. You may have a greater risk of getting burned. Eating and drinking  Drink enough fluid to keep your urine clear or pale yellow.  Resume your normal diet as instructed by your health care provider. Avoid heavy or fried foods that are hard to digest.  Avoid drinking alcohol for as long as instructed by your health care provider. Contact a health care provider if:  You have blood in your stool 2-3 days after the procedure. Get help right away if:  You have more than a small spotting of blood in your stool.  You pass large blood clots in your stool.  Your abdomen is swollen.  You have nausea or vomiting.  You have a fever.  You have increasing abdominal pain that is not relieved with medicine. This information is not intended to replace advice given to you by your health care provider. Make sure you discuss any questions you have with your health care provider. Document Released: 04/21/2004 Document Revised: 06/01/2016 Document Reviewed: 11/19/2015 Elsevier Interactive Patient Education  2018 [employer] Anesthesia is a term that refers to techniques, procedures, and  medicines that help a person stay safe and comfortable during a medical procedure.  Monitored anesthesia care, or sedation, is one type of anesthesia. Your anesthesia specialist may recommend sedation if you will be having a procedure that does not require you to be unconscious, such as:  Cataract surgery.  A dental procedure.  A biopsy.  A colonoscopy.  During the procedure, you may receive a medicine to help you relax (sedative). There are three levels of sedation:  Mild sedation. At this level, you may feel awake and relaxed. You will be able to follow directions.  Moderate sedation. At this level, you will be sleepy. You may not remember the procedure.  Deep sedation. At this level, you will be asleep. You will not remember the procedure.  The more medicine you are given, the deeper your level of sedation will be. Depending on how you respond to the procedure, the anesthesia specialist may change your level of sedation or the type of anesthesia to fit your needs. An anesthesia specialist will monitor you closely during the procedure. Let your health care provider know about:  Any allergies you have.  All medicines you are taking, including vitamins, herbs, eye drops, creams, and over-the-counter medicines.  Any use of steroids (by mouth or as a cream).  Any problems you or family members have had with sedatives and anesthetic medicines.  Any blood disorders you have.  Any surgeries you have had.  Any medical conditions you have, such as sleep apnea.  Whether you are pregnant or may be pregnant.  Any use of cigarettes, alcohol, or street drugs. What are the risks? Generally, this is a safe procedure. However, problems may occur, including:  Getting too much medicine (oversedation).  Nausea.  Allergic reaction to medicines.  Trouble breathing. If this happens, a breathing tube may be used to help with breathing. It will be removed when you are awake and breathing on  your own.  Heart trouble.  Lung trouble.  Before the procedure Staying hydrated Follow instructions from your health care provider about hydration, which may include:  Up to 2 hours before the procedure - you may continue to drink clear liquids, such as water, clear fruit juice, black coffee, and plain tea.  Eating and drinking restrictions Follow instructions from your health care provider about eating and drinking, which may include:  8 hours before the procedure - stop eating heavy meals or foods such as meat, fried foods, or fatty foods.  6 hours before the procedure - stop eating light meals or foods, such as toast or cereal.  6 hours before the procedure - stop drinking milk or drinks that contain milk.  2 hours before the procedure - stop drinking clear liquids.  Medicines Ask your health care provider about:  Changing or stopping your regular medicines. This is especially important if you are taking diabetes medicines or blood thinners.  Taking medicines such as aspirin and ibuprofen. These medicines can thin your blood. Do not take these medicines before your procedure if your health care provider instructs you not to.  Tests and exams  You will have a physical exam.  You may have blood tests done to show: ? How well your kidneys and liver are working. ? How well your blood can clot.  General instructions  Plan to have someone take you home from the hospital or clinic.  If you will be going home right after the procedure, plan to have someone with you for 24 hours.  What happens during the procedure?  Your blood pressure, heart rate, breathing, level of pain  and overall condition will be monitored.  An IV tube will be inserted into one of your veins.  Your anesthesia specialist will give you medicines as needed to keep you comfortable during the procedure. This may mean changing the level of sedation.  The procedure will be performed. After the  procedure  Your blood pressure, heart rate, breathing rate, and blood oxygen level will be monitored until the medicines you were given have worn off.  Do not drive for 24 hours if you received a sedative.  You may: ? Feel sleepy, clumsy, or nauseous. ? Feel forgetful about what happened after the procedure. ? Have a sore throat if you had a breathing tube during the procedure. ? Vomit. This information is not intended to replace advice given to you by your health care provider. Make sure you discuss any questions you have with your health care provider. Document Released: 06/03/2005 Document Revised: 02/14/2016 Document Reviewed: 12/29/2015 Elsevier Interactive Patient Education  2018 [employer], Care After These instructions provide you with information about caring for yourself after your procedure. Your health care provider may also give you more specific instructions. Your treatment has been planned according to current medical practices, but problems sometimes occur. Call your health care provider if you have any problems or questions after your procedure. What can I expect after the procedure? After your procedure, it is common to:  Feel sleepy for several hours.  Feel clumsy and have poor balance for several hours.  Feel forgetful about what happened after the procedure.  Have poor judgment for several hours.  Feel nauseous or vomit.  Have a sore throat if you had a breathing tube during the procedure.  Follow these instructions at home: For at least 24 hours after the procedure:   Do not: ? Participate in activities in which you could fall or become injured. ? Drive. ? Use heavy machinery. ? Drink alcohol. ? Take sleeping pills or medicines that cause drowsiness. ? Make important decisions or sign legal documents. ? Take care of children on your own.  Rest. Eating and drinking  Follow the diet that is recommended by your health  care provider.  If you vomit, drink water, juice, or soup when you can drink without vomiting.  Make sure you have little or no nausea before eating solid foods. General instructions  Have a responsible adult stay with you until you are awake and alert.  Take over-the-counter and prescription medicines only as told by your health care provider.  If you smoke, do not smoke without supervision.  Keep all follow-up visits as told by your health care provider. This is important. Contact a health care provider if:  You keep feeling nauseous or you keep vomiting.  You feel light-headed.  You develop a rash.  You have a fever. Get help right away if:  You have trouble breathing. This information is not intended to replace advice given to you by your health care provider. Make sure you discuss any questions you have with your health care provider. Document Released: 12/29/2015 Document Revised: 04/29/2016 Document Reviewed: 12/29/2015 Elsevier Interactive Patient Education  [employer].

## 2017-12-17 NOTE — Procedures (Signed)
   HISTORY: 37 year old male, complains of seizure  TECHNIQUE:  16 channel EEG was performed based on standard 10-16 international system. One channel was dedicated to EKG, which has demonstrates normal sinus rhythm of 72 beats per minutes.  Upon awakening, the posterior background activity was well-developed, mildly slow, 6-7 Hz, reactive to eye opening and closure.  There was no evidence of epileptiform discharge.  Photic stimulation was performed, which induced a symmetric photic driving.  Hyperventilation was performed, there was no abnormality elicit.  No sleep was achieved.  CONCLUSION: This is a mild abnormal EEG.  There is no electrodiagnostic evidence of epileptiform discharge, but there is evidence of mild generalized slowing, common etiologies are metabolic toxic.  Marcial Pacas, M.D. Ph.D.  [facility] [place], [place] [address] Phone: [phone] Fax:      [phone]

## 2017-12-21 ENCOUNTER — Encounter (HOSPITAL_COMMUNITY): Payer: Self-pay

## 2017-12-21 ENCOUNTER — Other Ambulatory Visit: Payer: Self-pay

## 2017-12-21 ENCOUNTER — Encounter (HOSPITAL_COMMUNITY)
Admission: RE | Admit: 2017-12-21 | Discharge: 2017-12-21 | Disposition: A | Discharge status: Home / Self Care | Payer: Medicaid Other | Source: Ambulatory Visit | Attending: Internal Medicine | Admitting: Internal Medicine

## 2017-12-21 DIAGNOSIS — Z01812 Encounter for preprocedural laboratory examination: Secondary | ICD-10-CM | POA: Insufficient documentation

## 2017-12-21 HISTORY — DX: Sleep apnea, unspecified: G47.30

## 2017-12-21 HISTORY — DX: Anxiety disorder, unspecified: F41.9

## 2017-12-21 HISTORY — DX: Gastro-esophageal reflux disease without esophagitis: K21.9

## 2017-12-21 HISTORY — DX: Depression, unspecified: F32.A

## 2017-12-21 HISTORY — DX: Borderline personality disorder: F60.3

## 2017-12-21 HISTORY — DX: Bipolar disorder, unspecified: F31.9

## 2017-12-21 HISTORY — DX: Major depressive disorder, single episode, unspecified: F32.9

## 2017-12-21 LAB — CBC WITH DIFFERENTIAL/PLATELET
Basophils Absolute: 0 10*3/uL (ref 0.0–0.1)
Basophils Relative: 0 %
Eosinophils Absolute: 0.4 10*3/uL (ref 0.0–0.7)
Eosinophils Relative: 6 %
HEMATOCRIT: 40.3 % (ref 39.0–52.0)
Hemoglobin: 13.4 g/dL (ref 13.0–17.0)
LYMPHS PCT: 41 %
Lymphs Abs: 2.6 10*3/uL (ref 0.7–4.0)
MCH: 32.8 pg (ref 26.0–34.0)
MCHC: 33.3 g/dL (ref 30.0–36.0)
MCV: 98.5 fL (ref 78.0–100.0)
MONO ABS: 0.7 10*3/uL (ref 0.1–1.0)
MONOS PCT: 11 %
Neutro Abs: 2.6 10*3/uL (ref 1.7–7.7)
Neutrophils Relative %: 42 %
Platelets: 134 10*3/uL — ABNORMAL LOW (ref 150–400)
RBC: 4.09 MIL/uL — ABNORMAL LOW (ref 4.22–5.81)
RDW: 12.4 % (ref 11.5–15.5)
WBC: 6.3 10*3/uL (ref 4.0–10.5)

## 2017-12-21 LAB — BASIC METABOLIC PANEL
Anion gap: 12 (ref 5–15)
BUN: 6 mg/dL (ref 6–20)
CALCIUM: 9.3 mg/dL (ref 8.9–10.3)
CO2: 25 mmol/L (ref 22–32)
Chloride: 102 mmol/L (ref 101–111)
Creatinine, Ser: 0.99 mg/dL (ref 0.61–1.24)
GFR calc Af Amer: >60 mL/min (ref >60)
GFR calc non Af Amer: >60 mL/min (ref >60)
GLUCOSE: 95 mg/dL (ref 65–99)
Potassium: 3.6 mmol/L (ref 3.5–5.1)
Sodium: 139 mmol/L (ref 135–145)

## 2017-12-23 ENCOUNTER — Encounter: Payer: Self-pay | Admitting: Neurology

## 2017-12-23 ENCOUNTER — Ambulatory Visit: Payer: Medicaid Other | Admitting: Neurology

## 2017-12-23 ENCOUNTER — Telehealth: Payer: Self-pay | Admitting: *Deleted

## 2017-12-23 VITALS — BP 129/80 | HR 70 | Ht 74.0 in | Wt 223.5 lb

## 2017-12-23 DIAGNOSIS — F39 Unspecified mood [affective] disorder: Secondary | ICD-10-CM

## 2017-12-23 DIAGNOSIS — R569 Unspecified convulsions: Secondary | ICD-10-CM | POA: Diagnosis not present

## 2017-12-23 DIAGNOSIS — Z79899 Other long term (current) drug therapy: Secondary | ICD-10-CM | POA: Diagnosis not present

## 2017-12-23 NOTE — Telephone Encounter (Signed)
Faxed completed form to [facility] to schedule pt for in-home 72-hour EEG. Fax: [phone]. Received confirmation.   
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
## 2017-12-27 ENCOUNTER — Encounter (HOSPITAL_COMMUNITY)
Admission: RE | Disposition: A | Discharge status: Home / Self Care | Payer: Self-pay | Source: Ambulatory Visit | Attending: Internal Medicine

## 2017-12-27 ENCOUNTER — Ambulatory Visit (HOSPITAL_COMMUNITY)
Admission: RE | Admit: 2017-12-27 | Discharge: 2017-12-27 | Disposition: A | Discharge status: Home / Self Care | Payer: Medicaid Other | Source: Ambulatory Visit | Attending: Internal Medicine | Admitting: Internal Medicine

## 2017-12-27 ENCOUNTER — Other Ambulatory Visit: Payer: Self-pay

## 2017-12-27 ENCOUNTER — Encounter (HOSPITAL_COMMUNITY): Payer: Self-pay | Admitting: *Deleted

## 2017-12-27 ENCOUNTER — Ambulatory Visit (HOSPITAL_COMMUNITY): Payer: Medicaid Other | Admitting: Anesthesiology

## 2017-12-27 DIAGNOSIS — R1013 Epigastric pain: Secondary | ICD-10-CM | POA: Insufficient documentation

## 2017-12-27 DIAGNOSIS — Z800 Family history of malignant neoplasm of digestive organs: Secondary | ICD-10-CM | POA: Insufficient documentation

## 2017-12-27 DIAGNOSIS — I2510 Atherosclerotic heart disease of native coronary artery without angina pectoris: Secondary | ICD-10-CM | POA: Diagnosis not present

## 2017-12-27 DIAGNOSIS — K319 Disease of stomach and duodenum, unspecified: Secondary | ICD-10-CM | POA: Diagnosis not present

## 2017-12-27 DIAGNOSIS — R569 Unspecified convulsions: Secondary | ICD-10-CM | POA: Diagnosis not present

## 2017-12-27 DIAGNOSIS — K219 Gastro-esophageal reflux disease without esophagitis: Secondary | ICD-10-CM | POA: Diagnosis present

## 2017-12-27 DIAGNOSIS — F319 Bipolar disorder, unspecified: Secondary | ICD-10-CM | POA: Diagnosis not present

## 2017-12-27 DIAGNOSIS — Z79899 Other long term (current) drug therapy: Secondary | ICD-10-CM | POA: Insufficient documentation

## 2017-12-27 DIAGNOSIS — K921 Melena: Secondary | ICD-10-CM | POA: Insufficient documentation

## 2017-12-27 DIAGNOSIS — I10 Essential (primary) hypertension: Secondary | ICD-10-CM | POA: Insufficient documentation

## 2017-12-27 DIAGNOSIS — Z538 Procedure and treatment not carried out for other reasons: Secondary | ICD-10-CM | POA: Diagnosis not present

## 2017-12-27 DIAGNOSIS — F17210 Nicotine dependence, cigarettes, uncomplicated: Secondary | ICD-10-CM | POA: Diagnosis not present

## 2017-12-27 DIAGNOSIS — F603 Borderline personality disorder: Secondary | ICD-10-CM | POA: Insufficient documentation

## 2017-12-27 DIAGNOSIS — F419 Anxiety disorder, unspecified: Secondary | ICD-10-CM | POA: Insufficient documentation

## 2017-12-27 DIAGNOSIS — Z8249 Family history of ischemic heart disease and other diseases of the circulatory system: Secondary | ICD-10-CM | POA: Diagnosis not present

## 2017-12-27 DIAGNOSIS — K5909 Other constipation: Secondary | ICD-10-CM | POA: Insufficient documentation

## 2017-12-27 DIAGNOSIS — G4730 Sleep apnea, unspecified: Secondary | ICD-10-CM | POA: Insufficient documentation

## 2017-12-27 HISTORY — PX: ESOPHAGOGASTRODUODENOSCOPY (EGD) WITH PROPOFOL: SHX5813

## 2017-12-27 HISTORY — PX: COLONOSCOPY WITH PROPOFOL: SHX5780

## 2017-12-27 HISTORY — PX: BIOPSY: SHX5522

## 2017-12-27 SURGERY — COLONOSCOPY WITH PROPOFOL
Anesthesia: General

## 2017-12-27 MED ORDER — MEPERIDINE HCL 100 MG/ML IJ SOLN: 6.2500 mg | INTRAMUSCULAR | Status: DC | PRN

## 2017-12-27 MED ORDER — PROPOFOL 10 MG/ML IV BOLUS
INTRAVENOUS | Status: DC | PRN
  Administered 2017-12-27 (×4): 20 mg via INTRAVENOUS

## 2017-12-27 MED ORDER — PROPOFOL 10 MG/ML IV BOLUS
INTRAVENOUS | Status: AC
  Filled 2017-12-27: qty 80

## 2017-12-27 MED ORDER — ONDANSETRON HCL 4 MG/2ML IJ SOLN: 4.0000 mg | Freq: Once | INTRAMUSCULAR | Status: DC | PRN

## 2017-12-27 MED ORDER — PEG 3350-KCL-NA BICARB-NACL 420 G PO SOLR
4000.0000 mL | Freq: Once | ORAL | Status: AC
  Administered 2017-12-27: 4000 mL via ORAL
  Filled 2017-12-27: qty 4000

## 2017-12-27 MED ORDER — PROPOFOL 500 MG/50ML IV EMUL
INTRAVENOUS | Status: DC | PRN
  Administered 2017-12-27: 175 ug/kg/min via INTRAVENOUS
  Administered 2017-12-27: 125 ug/kg/min via INTRAVENOUS

## 2017-12-27 MED ORDER — LACTATED RINGERS IV SOLN
INTRAVENOUS | Status: DC
  Administered 2017-12-27: 14:00:00 via INTRAVENOUS

## 2017-12-27 NOTE — Progress Notes (Signed)
Patient received two enemas as ordered per Dr. Gala Romney. Patient tolerated well. Results seen. Dr. Gala Romney assessed patient. Go ahead with procedure.

## 2017-12-27 NOTE — Op Note (Signed)
[facility] Patient Name: Jermaine [name] Procedure Date: 12/27/2017 2:09 PM MRN: [id number] Date of Birth: 12-Jan-1981 Attending MD: Norvel Richards , MD CSN: [id number] Age: 37 Admit Type: Outpatient Procedure:                Upper GI endoscopy Indications:              Nausea with vomiting Providers:                Norvel Richards, MD, Jeanann Lewandowsky. Sharon Seller, RN,                            Nelma Rothman, Technician Referring MD:              Medicines:                Propofol per Anesthesia Complications:            No immediate complications. Estimated Blood Loss:     Estimated blood loss was minimal. Procedure:                Pre-Anesthesia Assessment:                           - Prior to the procedure, a History and Physical                            was performed, and patient medications and                            allergies were reviewed. The patient's tolerance of                            previous anesthesia was also reviewed. The risks                            and benefits of the procedure and the sedation                            options and risks were discussed with the patient.                            All questions were answered, and informed consent                            was obtained. Prior Anticoagulants: The patient has                            taken no previous anticoagulant or antiplatelet                            agents. ASA Grade Assessment: II - A patient with                            mild systemic disease. After reviewing the risks  and benefits, the patient was deemed in                            satisfactory condition to undergo the procedure.                           After obtaining informed consent, the endoscope was                            passed under direct vision. Throughout the                            procedure, the patient's blood pressure, pulse, and                            oxygen  saturations were monitored continuously. The                            [id number] ([id number]) scope was introduced through the                            and advanced to the second part of duodenum. The                            upper GI endoscopy was accomplished without                            difficulty. The patient tolerated the procedure                            well. Scope In: 2:33:18 PM Scope Out: 6:96:29 PM Total Procedure Duration: 0 hours 3 minutes 54 seconds  Findings:      The examined esophagus was normal.      Mucosal changes were found in the stomach. Snake Skinning or fish scale       Diffusely. No varices.This was biopsied with a cold forceps for       histology. Estimated blood loss was minimal. Estimated blood loss was       minimal.      The duodenal bulb and second portion of the duodenum were normal. Impression:               - Normal esophagus.                           - Abnormal appearing stomach of uncertain                            significance. Biopsied.                           - Normal duodenal bulb and second portion of the                            duodenum. Moderate Sedation:      Moderate (conscious) sedation was personally administered by an  anesthesia professional. The following parameters were monitored: oxygen       saturation, heart rate, blood pressure, respiratory rate, EKG, adequacy       of pulmonary ventilation, and response to care. Total physician       intraservice time was 13 minutes. Recommendation:           - Patient has a contact number available for                            emergencies. The signs and symptoms of potential                            delayed complications were discussed with the                            patient. Return to normal activities tomorrow.                            Written discharge instructions were provided to the                            patient.                           - Resume  previous diet.                           - Continue present medications.                           - No repeat upper endoscopy.                           - Return to GI office in 2 months.                           - Patient has a contact number available for                            emergencies. The signs and symptoms of potential                            delayed complications were discussed with the                            patient. Return to normal activities tomorrow.                            Written discharge instructions were provided to the                            patient. Procedure Code(s):        --- Professional ---                           [phone], Esophagogastroduodenoscopy, flexible,  transoral; with biopsy, single or multiple Diagnosis Code(s):        --- Professional ---                           K31.89, Other diseases of stomach and duodenum                           R11.2, Nausea with vomiting, unspecified CPT copyright 2017 American Medical Association. All rights reserved. The codes documented in this report are preliminary and upon coder review may  be revised to meet current compliance requirements. Cristopher Estimable. Rourk, MD Norvel Richards, MD 12/27/2017 2:42:41 PM This report has been signed electronically. Number of Addenda: 0

## 2017-12-27 NOTE — Discharge Instructions (Addendum)
Sigmoidoscopy Discharge Instructions  Read the instructions outlined below and refer to this sheet in the next few weeks. These discharge instructions provide you with general information on caring for yourself after you leave the hospital. Your doctor may also give you specific instructions. While your treatment has been planned according to the most current medical practices available, unavoidable complications occasionally occur. If you have any problems or questions after discharge, call Dr. Gala Romney at [phone]. ACTIVITY  You may resume your regular activity, but move at a slower pace for the next 24 hours.   Take frequent rest periods for the next 24 hours.   Walking will help get rid of the air and reduce the bloated feeling in your belly (abdomen).   No driving for 24 hours (because of the medicine (anesthesia) used during the test).    Do not sign any important legal documents or operate any machinery for 24 hours (because of the anesthesia used during the test).  NUTRITION  Drink plenty of fluids.   You may resume your normal diet as instructed by your doctor.   Begin with a light meal and progress to your normal diet. Heavy or fried foods are harder to digest and may make you feel sick to your stomach (nauseated).   Avoid alcoholic beverages for 24 hours or as instructed.  MEDICATIONS  You may resume your normal medications unless your doctor tells you otherwise.  WHAT YOU CAN EXPECT TODAY  Some feelings of bloating in the abdomen.   Passage of more gas than usual.   Spotting of blood in your stool or on the toilet paper.  IF YOU HAD POLYPS REMOVED DURING THE COLONOSCOPY:  No aspirin products for 7 days or as instructed.   No alcohol for 7 days or as instructed.   Eat a soft diet for the next 24 hours.  FINDING OUT THE RESULTS OF YOUR TEST Not all test results are available during your visit. If your test results are not back during the visit, make an appointment  with your caregiver to find out the results. Do not assume everything is normal if you have not heard from your caregiver or the medical facility. It is important for you to follow up on all of your test results.  SEEK IMMEDIATE MEDICAL ATTENTION IF:  You have more than a spotting of blood in your stool.   Your belly is swollen (abdominal distention).   You are nauseated or vomiting.   You have a temperature over 101.   You have abdominal pain or discomfort that is severe or gets worse throughout the day.    Continue Amitiza 24 twice daily  Office visit with [place] in the next 4-6 weeks   May resume all normal medications   PATIENT INSTRUCTIONS POST-ANESTHESIA  IMMEDIATELY FOLLOWING SURGERY:  Do not drive or operate machinery for the first twenty four hours after surgery.  Do not make any important decisions for twenty four hours after surgery or while taking narcotic pain medications or sedatives.  If you develop intractable nausea and vomiting or a severe headache please notify your doctor immediately.  FOLLOW-UP:  Please make an appointment with your surgeon as instructed. You do not need to follow up with anesthesia unless specifically instructed to do so.  WOUND CARE INSTRUCTIONS (if applicable):  Keep a dry clean dressing on the anesthesia/puncture wound site if there is drainage.  Once the wound has quit draining you may leave it open to air.  Generally you should  leave the bandage intact for twenty four hours unless there is drainage.  If the epidural site drains for more than 36-48 hours please call the anesthesia department.  QUESTIONS?:  Please feel free to call your physician or the hospital operator if you have any questions, and they will be happy to assist you.

## 2017-12-27 NOTE — Op Note (Signed)
[facility] Patient Name: Jermaine [name] Procedure Date: 12/27/2017 12:21 PM MRN: [id number] Date of Birth: Jul 16, 1981 Attending MD: Norvel Richards , MD CSN: [id number] Age: 37 Admit Type: Outpatient Procedure:                Colonoscopy Indications:              Hematochezia Providers:                Norvel Richards, MD, Jeanann Lewandowsky. Sharon Seller, RN,                            Nelma Rothman, Technician Referring MD:              Medicines:                Propofol per Anesthesia Complications:            No immediate complications. Estimated Blood Loss:     Estimated blood loss: none. Procedure:                After obtaining informed consent, the colonoscope                            was passed under direct vision. Throughout the                            procedure, the patient's blood pressure, pulse, and                            oxygen saturations were monitored continuously. The                            [id number] ([id number]) scope was introduced through                            the with the intention of advancing to the cecum.                            The scope was advanced to the sigmoid colon before                            the procedure was aborted. Medications were given.                            formed stool in the lumen of the rectum and sigmoid                            precluded completion of examination. Scope In: 2:43:48 PM Scope Out: 2:47:23 PM Total Procedure Duration: 0 hours 3 minutes 35 seconds  Findings:      The perianal and digital rectal examinations were normal.      .      examination limited to the rectum and distal sigmoid. Much formed stool       in the lumen of the rectum and sigmoid colon precluded completion of       examination. Impression:               -  No specimens collected. Aborted attempt at                            colonoscopy. Limited sigmoidoscopy performed as                            outlined above. Moderate  Sedation:      Moderate (conscious) sedation was personally administered by an       anesthesia professional. The following parameters were monitored: oxygen       saturation, heart rate, blood pressure, respiratory rate, EKG, adequacy       of pulmonary ventilation, and response to care. Total physician       intraservice time was 31 minutes. Recommendation:           - Patient has a contact number available for                            emergencies. The signs and symptoms of potential                            delayed complications were discussed with the                            patient. Return to normal activities tomorrow.                            Written discharge instructions were provided to the                            patient.                           - Resume previous diet.                           - Continue present medications.                           - Repeat colonoscopy at appointment to be scheduled                            because the bowel preparation was suboptimal.                           - Return to GI office in 1 month. Would consider a                            Sitzmark study to further evaluate colonic transit                            in the near future. Procedure Code(s):        --- Professional ---                           [phone], 27, Colonoscopy, flexible; diagnostic,  including collection of specimen(s) by brushing or                            washing, when performed (separate procedure) Diagnosis Code(s):        --- Professional ---                           K92.1, Melena (includes Hematochezia) CPT copyright 2017 American Medical Association. All rights reserved. The codes documented in this report are preliminary and upon coder review may  be revised to meet current compliance requirements. Cristopher Estimable. Chrystian Ressler, MD Norvel Richards, MD 12/27/2017 2:56:27 PM This report has been signed electronically. Number of  Addenda: 0

## 2017-12-27 NOTE — Anesthesia Procedure Notes (Signed)
Procedure Name: MAC Date/Time: 12/27/2017 2:21 PM Performed by: Vista Deck, CRNA Pre-anesthesia Checklist: Patient identified, Emergency Drugs available, Suction available, Timeout performed and Patient being monitored Patient Re-evaluated:Patient Re-evaluated prior to induction Oxygen Delivery Method: Non-rebreather mask

## 2017-12-27 NOTE — Anesthesia Preprocedure Evaluation (Addendum)
Anesthesia Evaluation  Patient identified by MRN, date of birth, ID band Patient awake    Reviewed: Allergy & Precautions, H&P , NPO status , Patient's Chart, lab work & pertinent test results, reviewed documented beta blocker date and time   Airway Mallampati: II  TM Distance: >3 FB Neck ROM: full    Dental no notable dental hx.    Pulmonary neg pulmonary ROS, sleep apnea , Current Smoker,    Pulmonary exam normal breath sounds clear to auscultation       Cardiovascular Exercise Tolerance: Good hypertension, + CAD  negative cardio ROS   Rhythm:regular Rate:Normal     Neuro/Psych Seizures -,  Anxiety Depression Bipolar Disorder negative neurological ROS  negative psych ROS   GI/Hepatic negative GI ROS, Neg liver ROS, GERD  ,  Endo/Other  negative endocrine ROS  Renal/GU negative Renal ROS  negative genitourinary   Musculoskeletal   Abdominal   Peds  Hematology negative hematology ROS (+)   Anesthesia Other Findings   Reproductive/Obstetrics negative OB ROS                             Anesthesia Physical Anesthesia Plan  ASA: III  Anesthesia Plan: General   Post-op Pain Management:    Induction:   PONV Risk Score and Plan:   Airway Management Planned:   Additional Equipment:   Intra-op Plan:   Post-operative Plan:   Informed Consent: I have reviewed the patients History and Physical, chart, labs and discussed the procedure including the risks, benefits and alternatives for the proposed anesthesia with the patient or authorized representative who has indicated his/her understanding and acceptance.   Dental Advisory Given  Plan Discussed with: CRNA  Anesthesia Plan Comments:         Anesthesia Quick Evaluation
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
## 2017-12-27 NOTE — Addendum Note (Signed)
Addendum  created 12/27/17 1809 by Vista Deck, CRNA   Charge Capture section accepted

## 2017-12-27 NOTE — Anesthesia Postprocedure Evaluation (Signed)
Anesthesia Post Note  Patient: Jermaine [name]  Procedure(s) Performed: COLONOSCOPY WITH PROPOFOL (N/A ) ESOPHAGOGASTRODUODENOSCOPY (EGD) WITH PROPOFOL (N/A ) BIOPSY  Patient location during evaluation: PACU Anesthesia Type: General Level of consciousness: awake and alert Pain management: satisfactory to patient Vital Signs Assessment: post-procedure vital signs reviewed and stable Respiratory status: spontaneous breathing Cardiovascular status: stable Postop Assessment: no apparent nausea or vomiting Anesthetic complications: no     Last Vitals:  Vitals:   12/27/17 1458 12/27/17 1500  BP: (!) 90/52 (!) 92/49  Pulse: 62 64  Resp: 18 19  Temp: 36.8 C   SpO2: 91% 92%    Last Pain:  Vitals:   12/27/17 1458  TempSrc:   PainSc: Asleep                 Ranier Coach

## 2017-12-27 NOTE — Transfer of Care (Signed)
Immediate Anesthesia Transfer of Care Note  Patient: Jermaine [name]  Procedure(s) Performed: COLONOSCOPY WITH PROPOFOL (N/A ) ESOPHAGOGASTRODUODENOSCOPY (EGD) WITH PROPOFOL (N/A ) BIOPSY  Patient Location: PACU  Anesthesia Type:MAC  Level of Consciousness: awake, alert  and patient cooperative  Airway & Oxygen Therapy: Patient Spontanous Breathing  Post-op Assessment: Report given to RN and Post -op Vital signs reviewed and stable  Post vital signs: Reviewed and stable  Last Vitals:  Vitals Value Taken Time  BP    Temp    Pulse    Resp    SpO2      Last Pain:  Vitals:   12/27/17 1424  TempSrc:   PainSc: 0-No pain      Patients Stated Pain Goal: 5 (00/45/99 7741)  Complications: No apparent anesthesia complications

## 2017-12-28 ENCOUNTER — Encounter: Payer: Self-pay | Admitting: Internal Medicine

## 2017-12-29 ENCOUNTER — Ambulatory Visit: Payer: Medicaid Other | Admitting: Urology

## 2017-12-29 ENCOUNTER — Telehealth: Payer: Self-pay

## 2017-12-29 DIAGNOSIS — N430 Encysted hydrocele: Secondary | ICD-10-CM

## 2017-12-29 NOTE — Telephone Encounter (Signed)
Pt walked in office around 11:05 AM, asking for a doctors note, stating that pt can't return to work until 03/15/18. Pt has a colonoscopy on 12/27/17 and was asked to return schedule a follow up appointment to repeat the colonoscopy. Pt states that he has a note from his neurological doctor and would like a note from our office since he is on stool softeners daily. Pt states that he can't attend work while waiting for his next colonoscopy.   Routing message

## 2017-12-29 NOTE — Telephone Encounter (Signed)
I spoke with the patient it appears he needs a note for a court date on May 5th.    He went on to say that his wife walked out on him and he has a lot going on right now.  I told him he should go see his pcp and see if they can write him a note to be out of work for two months.    I asked him who was his employer and he said he works for himself and has been out of work since August of 2018.  I made him aware that we will not be able to provide him with a tow month work note.  He said he would make an appt to see his pcp.

## 2017-12-30 ENCOUNTER — Encounter: Payer: Self-pay | Admitting: Internal Medicine

## 2017-12-31 ENCOUNTER — Encounter (HOSPITAL_COMMUNITY): Payer: Self-pay | Admitting: Internal Medicine

## 2018-01-04 ENCOUNTER — Other Ambulatory Visit: Payer: Self-pay | Admitting: Urology

## 2018-01-04 LAB — DRUG SCREEN 10 W/CONF, SERUM
Amphetamines, IA: NEGATIVE ng/mL
BARBITURATES, IA: NEGATIVE ug/mL
Benzodiazepines, IA: NEGATIVE ng/mL
Cocaine & Metabolite, IA: NEGATIVE ng/mL
Methadone, IA: NEGATIVE ng/mL
OXYCODONES, IA: NEGATIVE ng/mL
Opiates, IA: NEGATIVE ng/mL
PROPOXYPHENE, IA: NEGATIVE ng/mL
Phencyclidine, IA: NEGATIVE ng/mL
THC(MARIJUANA) METABOLITE, IA: POSITIVE ng/mL — AB

## 2018-01-04 LAB — THC,MS,WB/SP RFX
CANNABIDIOL: 13.8 ng/mL
CANNABINOID CONFIRMATION: POSITIVE
Cannabinol: NEGATIVE ng/mL
Carboxy-THC: 53.9 ng/mL
Hydroxy-THC: NEGATIVE ng/mL
TETRAHYDROCANNABINOL(THC): 9 ng/mL

## 2018-01-04 LAB — OPIATES,MS,WB/SP RFX
6-Acetylmorphine: NEGATIVE
CODEINE: NEGATIVE ng/mL
Dihydrocodeine: NEGATIVE ng/mL
HYDROMORPHONE: NEGATIVE ng/mL
Hydrocodone: NEGATIVE ng/mL
Morphine: NEGATIVE ng/mL
OPIATE CONFIRMATION: NEGATIVE

## 2018-01-04 LAB — LEVETIRACETAM LEVEL: LEVETIRACETAM: 29 ug/mL (ref 10.0–40.0)

## 2018-01-04 LAB — LAMOTRIGINE LEVEL: LAMOTRIGINE LVL: 5.9 ug/mL (ref 2.0–20.0)

## 2018-01-04 LAB — VALPROIC ACID LEVEL: VALPROIC ACID LVL: 61 ug/mL (ref 50–100)

## 2018-01-11 NOTE — Patient Instructions (Signed)
Jermaine [name]  01/11/2018     @PREFPERIOPPHARMACY @   Your procedure is scheduled on 01/14/2018.  Report to [facility] at 1015 A.M.  Call this number if you have problems the morning of surgery:  [phone]   Remember:  Do not eat food or drink liquids after midnight.  Take these medicines the morning of surgery with A SIP OF WATER Catapress, Depakote, Lamictal, Keppra, Suboxone, Seroquel, Lithium, Metoprolol,   Zofran OR Phenergan if needed, Pro Air inhaler   Do not wear jewelry, make-up or nail polish.  Do not wear lotions, powders, or perfumes, or deodorant.  Do not shave 48 hours prior to surgery.  Men may shave face and neck.  Do not bring valuables to the hospital.  [facility] is not responsible for any belongings or valuables.  Contacts, dentures or bridgework may not be worn into surgery.  Leave your suitcase in the car.  After surgery it may be brought to your room.  For patients admitted to the hospital, discharge time will be determined by your treatment team.  Patients discharged the day of surgery will not be allowed to drive home.   Please read over the following fact sheets that you were given. Surgical Site Infection Prevention and Anesthesia Post-op Instructions     PATIENT INSTRUCTIONS POST-ANESTHESIA  IMMEDIATELY FOLLOWING SURGERY:  Do not drive or operate machinery for the first twenty four hours after surgery.  Do not make any important decisions for twenty four hours after surgery or while taking narcotic pain medications or sedatives.  If you develop intractable nausea and vomiting or a severe headache please notify your doctor immediately.  FOLLOW-UP:  Please make an appointment with your surgeon as instructed. You do not need to follow up with anesthesia unless specifically instructed to do so.  WOUND CARE INSTRUCTIONS (if applicable):  Keep a dry clean dressing on the anesthesia/puncture wound site if there is drainage.  Once the wound has quit  draining you may leave it open to air.  Generally you should leave the bandage intact for twenty four hours unless there is drainage.  If the epidural site drains for more than 36-48 hours please call the anesthesia department.  QUESTIONS?:  Please feel free to call your physician or the hospital operator if you have any questions, and they will be happy to assist you.      Hydrocelectomy, Adult A hydrocelectomy is a surgical procedure to remove a collection of fluid (hydrocele) from the pouch that holds the testicles (scrotum). You may need to have a hydrocelectomy if a hydrocele is making your scrotum swell painfully. Tell a health care provider about:  Any allergies you have.  All medicines you are taking, including vitamins, herbs, eye drops, creams, and over-the-counter medicines.  Any problems you or family members have had with anesthetic medicines.  Any blood disorders you have.  Any surgeries you have had.  Any medical conditions you have. What are the risks? Generally this is a safe procedure. However, problems may occur, including:  Bleeding into the scrotum (scrotal hematoma).  Damage to the testicle or the tube that carries sperm out of the testicle (vas deferens).  Infection.  Allergic reactions to medicines.  What happens before the procedure? Staying hydrated Follow instructions from your health care provider about hydration, which may include:  Up to 2 hours before the procedure - you may continue to drink clear liquids, such as water, clear fruit juice, black coffee, and plain tea.  Eating and drinking restrictions Follow instructions from your health care provider about eating and drinking, which may include:  8 hours before the procedure - stop eating heavy meals or foods such as meat, fried foods, or fatty foods.  6 hours before the procedure - stop eating light meals or foods, such as toast or cereal.  6 hours before the procedure - stop drinking milk  or drinks that contain milk.  2 hours before the procedure - stop drinking clear liquids.  Medicines  Ask your health care provider about: ? Changing or stopping your regular medicines. This is especially important if you are taking diabetes medicines or blood thinners. ? Taking medicines such as aspirin and ibuprofen. These medicines can thin your blood. Do not take these medicines before your procedure if your health care provider instructs you not to.  You may be given antibiotic medicine to help prevent infection. General instructions  Plan to have someone take you home after the procedure. What happens during the procedure?  To reduce your risk of infection: ? Your health care team will wash or sanitize their hands. ? A germ-killing solution (antiseptic) will be used to wash your scrotum and the area around it. Hair may be removed from this area.  An IV tube will be inserted into one of your veins.  You will be given one or more of the following: ? A medicine to make you relax (sedative). ? A medicine to make you fall asleep (general anesthetic).  A small incision will be made through the skin of your scrotum.  Your testicle and the hydrocele will be located, and the hydrocele sac will be opened with an incision.  The fluid will be drained from the hydrocele.  The hydrocele will be closed with absorbable stitches (sutures) to prevent fluid from building up again.  If you have a large hydrocele, a thin rubber drain may be placed to allow fluid to drain after the procedure.  The incision in your scrotum will be closed with absorbable sutures.  A bandage (dressing) will be placed over the incision. The procedure may vary among health care providers and hospitals. What happens after the procedure?  Your blood pressure, heart rate, breathing rate, and blood oxygen level will be monitored until the medicines you were given have worn off.  You will be given pain medicine as  needed.  Do not drive for 24 hours if you were given a sedative.  You may have to wear an athletic support strap to hold the dressing in place and support your scrotum. This information is not intended to replace advice given to you by your health care provider. Make sure you discuss any questions you have with your health care provider. Document Released: 05/29/2015 Document Revised: 06/06/2016 Document Reviewed: 06/06/2016 Elsevier Interactive Patient Education  [employer].

## 2018-01-12 ENCOUNTER — Encounter (HOSPITAL_COMMUNITY): Payer: Self-pay

## 2018-01-12 ENCOUNTER — Encounter (HOSPITAL_COMMUNITY)
Admission: RE | Admit: 2018-01-12 | Discharge: 2018-01-12 | Disposition: A | Discharge status: Home / Self Care | Payer: Medicaid Other | Source: Ambulatory Visit | Attending: Urology | Admitting: Urology

## 2018-01-12 ENCOUNTER — Other Ambulatory Visit: Payer: Self-pay

## 2018-01-12 DIAGNOSIS — N433 Hydrocele, unspecified: Secondary | ICD-10-CM | POA: Diagnosis not present

## 2018-01-12 DIAGNOSIS — Z01812 Encounter for preprocedural laboratory examination: Secondary | ICD-10-CM | POA: Insufficient documentation

## 2018-01-12 LAB — CBC
HCT: 46.3 % (ref 39.0–52.0)
Hemoglobin: 15.7 g/dL (ref 13.0–17.0)
MCH: 31.5 pg (ref 26.0–34.0)
MCHC: 33.9 g/dL (ref 30.0–36.0)
MCV: 93 fL (ref 78.0–100.0)
PLATELETS: 189 10*3/uL (ref 150–400)
RBC: 4.98 MIL/uL (ref 4.22–5.81)
RDW: 12.1 % (ref 11.5–15.5)
WBC: 11.8 10*3/uL — ABNORMAL HIGH (ref 4.0–10.5)

## 2018-01-12 LAB — BASIC METABOLIC PANEL
ANION GAP: 12 (ref 5–15)
BUN: 17 mg/dL (ref 6–20)
CO2: 28 mmol/L (ref 22–32)
Calcium: 9.5 mg/dL (ref 8.9–10.3)
Chloride: 98 mmol/L — ABNORMAL LOW (ref 101–111)
Creatinine, Ser: 1.12 mg/dL (ref 0.61–1.24)
GFR calc Af Amer: >60 mL/min (ref >60)
Glucose, Bld: 107 mg/dL — ABNORMAL HIGH (ref 65–99)
POTASSIUM: 3.9 mmol/L (ref 3.5–5.1)
SODIUM: 138 mmol/L (ref 135–145)

## 2018-01-14 ENCOUNTER — Ambulatory Visit (HOSPITAL_COMMUNITY): Payer: Medicaid Other | Admitting: Anesthesiology

## 2018-01-14 ENCOUNTER — Encounter (HOSPITAL_COMMUNITY): Payer: Self-pay | Admitting: *Deleted

## 2018-01-14 ENCOUNTER — Ambulatory Visit (HOSPITAL_COMMUNITY)
Admission: RE | Admit: 2018-01-14 | Discharge: 2018-01-14 | Disposition: A | Discharge status: Home / Self Care | Payer: Medicaid Other | Source: Ambulatory Visit | Attending: Urology | Admitting: Urology

## 2018-01-14 ENCOUNTER — Encounter (HOSPITAL_COMMUNITY)
Admission: RE | Disposition: A | Discharge status: Home / Self Care | Payer: Self-pay | Source: Ambulatory Visit | Attending: Urology

## 2018-01-14 DIAGNOSIS — F603 Borderline personality disorder: Secondary | ICD-10-CM | POA: Diagnosis not present

## 2018-01-14 DIAGNOSIS — G4730 Sleep apnea, unspecified: Secondary | ICD-10-CM | POA: Diagnosis not present

## 2018-01-14 DIAGNOSIS — N433 Hydrocele, unspecified: Secondary | ICD-10-CM | POA: Diagnosis present

## 2018-01-14 DIAGNOSIS — F319 Bipolar disorder, unspecified: Secondary | ICD-10-CM | POA: Diagnosis not present

## 2018-01-14 DIAGNOSIS — F17210 Nicotine dependence, cigarettes, uncomplicated: Secondary | ICD-10-CM | POA: Diagnosis not present

## 2018-01-14 DIAGNOSIS — F418 Other specified anxiety disorders: Secondary | ICD-10-CM | POA: Diagnosis not present

## 2018-01-14 DIAGNOSIS — I10 Essential (primary) hypertension: Secondary | ICD-10-CM | POA: Insufficient documentation

## 2018-01-14 DIAGNOSIS — I2510 Atherosclerotic heart disease of native coronary artery without angina pectoris: Secondary | ICD-10-CM | POA: Insufficient documentation

## 2018-01-14 DIAGNOSIS — N430 Encysted hydrocele: Secondary | ICD-10-CM | POA: Diagnosis not present

## 2018-01-14 DIAGNOSIS — K219 Gastro-esophageal reflux disease without esophagitis: Secondary | ICD-10-CM | POA: Diagnosis not present

## 2018-01-14 DIAGNOSIS — Z79899 Other long term (current) drug therapy: Secondary | ICD-10-CM | POA: Diagnosis not present

## 2018-01-14 HISTORY — PX: HYDROCELE EXCISION: SHX482

## 2018-01-14 SURGERY — HYDROCELECTOMY
Anesthesia: General | Laterality: Left

## 2018-01-14 MED ORDER — BUPIVACAINE HCL (PF) 0.25 % IJ SOLN
INTRAMUSCULAR | Status: AC
  Filled 2018-01-14: qty 30

## 2018-01-14 MED ORDER — OXYCODONE-ACETAMINOPHEN 5-325 MG PO TABS
ORAL_TABLET | ORAL | Status: AC
  Filled 2018-01-14: qty 1

## 2018-01-14 MED ORDER — 0.9 % SODIUM CHLORIDE (POUR BTL) OPTIME
TOPICAL | Status: DC | PRN
  Administered 2018-01-14: 1000 mL

## 2018-01-14 MED ORDER — PROPOFOL 10 MG/ML IV BOLUS
INTRAVENOUS | Status: DC | PRN
  Administered 2018-01-14: 40 mg via INTRAVENOUS
  Administered 2018-01-14: 160 mg via INTRAVENOUS

## 2018-01-14 MED ORDER — FENTANYL CITRATE (PF) 100 MCG/2ML IJ SOLN
INTRAMUSCULAR | Status: AC
  Filled 2018-01-14: qty 2

## 2018-01-14 MED ORDER — SODIUM CHLORIDE 0.9% FLUSH
INTRAVENOUS | Status: AC
  Filled 2018-01-14: qty 10

## 2018-01-14 MED ORDER — PROPOFOL 10 MG/ML IV BOLUS
INTRAVENOUS | Status: AC
  Filled 2018-01-14: qty 20

## 2018-01-14 MED ORDER — FENTANYL CITRATE (PF) 250 MCG/5ML IJ SOLN
INTRAMUSCULAR | Status: AC
  Filled 2018-01-14: qty 5

## 2018-01-14 MED ORDER — ONDANSETRON HCL 4 MG/2ML IJ SOLN
INTRAMUSCULAR | Status: DC | PRN
  Administered 2018-01-14: 4 mg via INTRAVENOUS

## 2018-01-14 MED ORDER — LACTATED RINGERS IV SOLN: INTRAVENOUS | Status: DC

## 2018-01-14 MED ORDER — CEFAZOLIN SODIUM-DEXTROSE 2-4 GM/100ML-% IV SOLN
2.0000 g | INTRAVENOUS | Status: AC
  Administered 2018-01-14: 2 g via INTRAVENOUS

## 2018-01-14 MED ORDER — LIDOCAINE HCL (PF) 1 % IJ SOLN
INTRAMUSCULAR | Status: AC
  Filled 2018-01-14: qty 5

## 2018-01-14 MED ORDER — FENTANYL CITRATE (PF) 100 MCG/2ML IJ SOLN
INTRAMUSCULAR | Status: DC | PRN
  Administered 2018-01-14 (×2): 50 ug via INTRAVENOUS

## 2018-01-14 MED ORDER — KETOROLAC TROMETHAMINE 30 MG/ML IJ SOLN
30.0000 mg | Freq: Once | INTRAMUSCULAR | Status: AC | PRN
  Administered 2018-01-14: 30 mg via INTRAVENOUS
  Filled 2018-01-14: qty 1

## 2018-01-14 MED ORDER — ONDANSETRON HCL 4 MG/2ML IJ SOLN
INTRAMUSCULAR | Status: AC
  Filled 2018-01-14: qty 2

## 2018-01-14 MED ORDER — HYDROCODONE-ACETAMINOPHEN 7.5-325 MG PO TABS
1.0000 | ORAL_TABLET | Freq: Once | ORAL | Status: DC | PRN
  Filled 2018-01-14: qty 1

## 2018-01-14 MED ORDER — MIDAZOLAM HCL 5 MG/5ML IJ SOLN
INTRAMUSCULAR | Status: DC | PRN
  Administered 2018-01-14: 2 mg via INTRAVENOUS

## 2018-01-14 MED ORDER — BUPIVACAINE HCL (PF) 0.25 % IJ SOLN
INTRAMUSCULAR | Status: DC | PRN
  Administered 2018-01-14: 10 mL

## 2018-01-14 MED ORDER — MIDAZOLAM HCL 2 MG/2ML IJ SOLN
INTRAMUSCULAR | Status: AC
  Filled 2018-01-14: qty 2

## 2018-01-14 MED ORDER — ONDANSETRON HCL 4 MG/2ML IJ SOLN: 4.0000 mg | Freq: Once | INTRAMUSCULAR | Status: DC | PRN

## 2018-01-14 MED ORDER — LACTATED RINGERS IV SOLN
INTRAVENOUS | Status: DC | PRN
  Administered 2018-01-14: 11:00:00 via INTRAVENOUS

## 2018-01-14 MED ORDER — MEPERIDINE HCL 50 MG/ML IJ SOLN: 6.2500 mg | INTRAMUSCULAR | Status: DC | PRN

## 2018-01-14 MED ORDER — OXYCODONE-ACETAMINOPHEN 5-325 MG PO TABS: 1.0000 | ORAL_TABLET | ORAL | 0 refills | Status: DC | PRN

## 2018-01-14 MED ORDER — CEFAZOLIN SODIUM-DEXTROSE 2-4 GM/100ML-% IV SOLN
INTRAVENOUS | Status: AC
  Filled 2018-01-14: qty 100

## 2018-01-14 MED ORDER — OXYCODONE-ACETAMINOPHEN 5-325 MG PO TABS
1.0000 | ORAL_TABLET | ORAL | Status: DC | PRN
  Administered 2018-01-14: 1 via ORAL

## 2018-01-14 MED ORDER — HYDROMORPHONE HCL 1 MG/ML IJ SOLN
0.2500 mg | INTRAMUSCULAR | Status: DC | PRN
  Administered 2018-01-14: 0.5 mg via INTRAVENOUS
  Filled 2018-01-14: qty 0.5

## 2018-01-14 SURGICAL SUPPLY — 33 items
ADH SKN CLS APL DERMABOND .7 (GAUZE/BANDAGES/DRESSINGS) ×1
BAG HAMPER (MISCELLANEOUS) ×3 IMPLANT
BLADE SURG 15 STRL LF DISP TIS (BLADE) ×1 IMPLANT
BLADE SURG 15 STRL SS (BLADE) ×3
COVER LIGHT HANDLE STERIS (MISCELLANEOUS) ×6 IMPLANT
DERMABOND ADVANCED (GAUZE/BANDAGES/DRESSINGS) ×2
DERMABOND ADVANCED .7 DNX12 (GAUZE/BANDAGES/DRESSINGS) IMPLANT
ELECT REM PT RETURN 9FT ADLT (ELECTROSURGICAL) ×3
ELECTRODE REM PT RTRN 9FT ADLT (ELECTROSURGICAL) ×1 IMPLANT
GAUZE SPONGE 4X4 12PLY STRL (GAUZE/BANDAGES/DRESSINGS) ×4 IMPLANT
GLOVE BIO SURGEON STRL SZ8 (GLOVE) ×3 IMPLANT
GLOVE BIOGEL PI IND STRL 7.0 (GLOVE) ×2 IMPLANT
GLOVE BIOGEL PI INDICATOR 7.0 (GLOVE) ×4
GLOVE ECLIPSE 6.5 STRL STRAW (GLOVE) ×2 IMPLANT
GOWN STRL REUS W/ TWL LRG LVL3 (GOWN DISPOSABLE) ×1 IMPLANT
GOWN STRL REUS W/ TWL XL LVL3 (GOWN DISPOSABLE) ×1 IMPLANT
GOWN STRL REUS W/TWL LRG LVL3 (GOWN DISPOSABLE) ×3
GOWN STRL REUS W/TWL XL LVL3 (GOWN DISPOSABLE) ×3
KIT TURNOVER KIT A (KITS) ×3 IMPLANT
MANIFOLD NEPTUNE II (INSTRUMENTS) ×3 IMPLANT
NDL HYPO 25X1 1.5 SAFETY (NEEDLE) ×1 IMPLANT
NEEDLE HYPO 25X1 1.5 SAFETY (NEEDLE) ×3 IMPLANT
NS IRRIG 1000ML POUR BTL (IV SOLUTION) ×3 IMPLANT
PACK MINOR (CUSTOM PROCEDURE TRAY) ×3 IMPLANT
PAD ARMBOARD 7.5X6 YLW CONV (MISCELLANEOUS) ×3 IMPLANT
SET BASIN LINEN APH (SET/KITS/TRAYS/PACK) ×3 IMPLANT
SUPPORT SCROTAL LG STRP (MISCELLANEOUS) ×2 IMPLANT
SUPPORTER ATHLETIC LG (MISCELLANEOUS) ×1
SUT MNCRL AB 4-0 PS2 18 (SUTURE) ×3 IMPLANT
SUT VIC AB 3-0 SH 27 (SUTURE) ×3
SUT VIC AB 3-0 SH 27X BRD (SUTURE) ×1 IMPLANT
SYR CONTROL 10ML LL (SYRINGE) ×3 IMPLANT
YANKAUER SUCT 12FT TUBE ARGYLE (SUCTIONS) ×3 IMPLANT

## 2018-01-14 NOTE — Op Note (Signed)
Preoperative diagnosis: Left hydrocele  Postoperative diagnosis: left hydrocele  Procedure: 1. Left hydrocelectomy  Attending: Nicolette Bang, MD  Anesthesia: General  History of blood loss: Minimal  Antibiotics: ancef  Drains: none  Specimens: 1. Left hydrocele sac   Findings:  5cm hydrocele  Indications: Patient is a 37 year old male with a history of left hydrocele that was growing in size and causing him pain with walking.  We discussed the treatment options including observation versus excision after discussing treatment options he and his mother decided to proceed with excision.   Procedure in detail: Prior to procedure consent was obtained.  Patient was brought to the operating room and a brief timeout was done to ensure correct patient, correct procedure, correct site.  General anesthesia was administered and patient was placed in supine position.  His genitalia was then prepped and draped in usual sterile fashion.  A 3 cm incision was made in the left hemiscrotum.  We dissected down to the tunica and then incised the tunica. A 5cm hydrocele was encountered. We then excised the hydrocele sac and then over sewed the edge with 2-0 Vicryl in a running fashion. We then excised the left appendix testis and sent it for pathology. We then returned the testis to the left hemiscrotum and closed the overlying dartos with 3-0 vicryl in a running fashion. The skin was then closed with 3-0 chromic in a running fashion. Dermabond was placed on the incision.  A dressing was then applied to the incision.  We then placed a scrotal fluff and this then concluded the procedure which was well tolerated by the patient.  Complications: None  Condition: Stable, extubated, transferred to PACU.  Plan: Patient is to be discharged home.  He is to follow up in 2 weeks for wound check.

## 2018-01-14 NOTE — Transfer of Care (Signed)
Immediate Anesthesia Transfer of Care Note  Patient: Jermaine [name]  Procedure(s) Performed: LEFT HYDROCELECTOMY (Left )  Patient Location: PACU  Anesthesia Type:General  Level of Consciousness: drowsy and patient cooperative  Airway & Oxygen Therapy: Patient Spontanous Breathing and Patient connected to nasal cannula oxygen  Post-op Assessment: Report given to RN and Post -op Vital signs reviewed and stable  Post vital signs: Reviewed and stable  Last Vitals:  Vitals Value Taken Time  BP 132/83 01/14/2018  1:37 PM  Temp    Pulse 73 01/14/2018  1:38 PM  Resp 21 01/14/2018  1:38 PM  SpO2 94 % 01/14/2018  1:38 PM  Vitals shown include unvalidated device data.  Last Pain:  Vitals:   01/14/18 1041  TempSrc: Oral  PainSc: 3          Complications: No apparent anesthesia complications

## 2018-01-14 NOTE — Discharge Instructions (Signed)
Hydrocelectomy, Adult, Care After This sheet gives you information about how to care for yourself after your procedure. Your health care provider may also give you more specific instructions. If you have problems or questions, contact your health care provider. What can I expect after the procedure? After your procedure, it is common to have mild discomfort, swelling, and bruising in the pouch that holds your testicles (scrotum). Follow these instructions at home: Bathing  Ask your health care provider when you can shower, take baths, or go swimming.  If you were told to wear an athletic support strap, take it off when you shower or take a bath. Incision care  Follow instructions from your health care provider about how to take care of your incision. Make sure you: ? Wash your hands with soap and water before you change your bandage (dressing). If soap and water are not available, use hand sanitizer. ? Change your dressing as told by your health care provider. ? Leave stitches (sutures) in place.  Check your incision and scrotum every day for signs of infection. Check for: ? More redness, swelling, or pain. ? Blood or fluid. ? Warmth. ? Pus or a bad smell. Managing pain, stiffness, and swelling  If directed, apply ice to the injured area: ? Put ice in a plastic bag. ? Place a towel between your skin and the bag. ? Leave the ice on for 20 minutes, 2-3 times per day. Driving  Do not drive for 24 hours if you were given a sedative.  Do not drive or use heavy machinery while taking prescription pain medicine.  Ask your health care provider when it is safe to drive. Activity  Do not do any activities that require great strength and energy (are vigorous) for as long as told by your health care provider.  Return to your normal activities as told by your health care provider. Ask your health care provider what activities are safe for you.  Do not lift anything that is heavier than 10  lb (4.5 kg) until your health care provider says that it is safe. General instructions  Take over-the-counter and prescription medicines only as told by your health care provider.  Keep all follow-up visits as told by your health care provider. This is important.  If you were given an athletic support strap, wear it as told by your health care provider.  If you had a drain put in during the procedure, you will need to return for a follow-up visit to have it removed. Contact a health care provider if:  Your pain is not controlled with medicine.  You have more redness or swelling around your scrotum.  You have blood or fluid coming from your scrotum.  Your incision feels warm to the touch.  You have pus or a bad smell coming from your scrotum.  You have a fever. This information is not intended to replace advice given to you by your health care provider. Make sure you discuss any questions you have with your health care provider. Document Released: 05/29/2015 Document Revised: 06/06/2016 Document Reviewed: 06/06/2016 Elsevier Interactive Patient Education  2018 [place] Anesthesia, Adult, Care After These instructions provide you with information about caring for yourself after your procedure. Your health care provider may also give you more specific instructions. Your treatment has been planned according to current medical practices, but problems sometimes occur. Call your health care provider if you have any problems or questions after your procedure. What can  I expect after the procedure? After the procedure, it is common to have:  Vomiting.  A sore throat.  Mental slowness.  It is common to feel:  Nauseous.  Cold or shivery.  Sleepy.  Tired.  Sore or achy, even in parts of your body where you did not have surgery.  Follow these instructions at home: For at least 24 hours after the procedure:  Do not: ? Participate in activities where you  could fall or become injured. ? Drive. ? Use heavy machinery. ? Drink alcohol. ? Take sleeping pills or medicines that cause drowsiness. ? Make important decisions or sign legal documents. ? Take care of children on your own.  Rest. Eating and drinking  If you vomit, drink water, juice, or soup when you can drink without vomiting.  Drink enough fluid to keep your urine clear or pale yellow.  Make sure you have little or no nausea before eating solid foods.  Follow the diet recommended by your health care provider. General instructions  Have a responsible adult stay with you until you are awake and alert.  Return to your normal activities as told by your health care provider. Ask your health care provider what activities are safe for you.  Take over-the-counter and prescription medicines only as told by your health care provider.  If you smoke, do not smoke without supervision.  Keep all follow-up visits as told by your health care provider. This is important. Contact a health care provider if:  You continue to have nausea or vomiting at home, and medicines are not helpful.  You cannot drink fluids or start eating again.  You cannot urinate after 8-12 hours.  You develop a skin rash.  You have fever.  You have increasing redness at the site of your procedure. Get help right away if:  You have difficulty breathing.  You have chest pain.  You have unexpected bleeding.  You feel that you are having a life-threatening or urgent problem. This information is not intended to replace advice given to you by your health care provider. Make sure you discuss any questions you have with your health care provider. Document Released: 12/14/2000 Document Revised: 02/10/2016 Document Reviewed: 08/22/2015 Elsevier Interactive Patient Education  [employer].
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
## 2018-01-14 NOTE — Anesthesia Procedure Notes (Signed)
Procedure Name: LMA Insertion Date/Time: 01/14/2018 12:47 PM Performed by: Charmaine Downs, CRNA Pre-anesthesia Checklist: Patient identified, Patient being monitored, Emergency Drugs available, Timeout performed and Suction available Patient Re-evaluated:Patient Re-evaluated prior to induction Oxygen Delivery Method: Circle System Utilized Preoxygenation: Pre-oxygenation with 100% oxygen Induction Type: IV induction Ventilation: Mask ventilation without difficulty LMA: LMA inserted LMA Size: 4.0 Number of attempts: 1 Placement Confirmation: positive ETCO2 and breath sounds checked- equal and bilateral Tube secured with: Tape Dental Injury: Teeth and Oropharynx as per pre-operative assessment

## 2018-01-14 NOTE — Anesthesia Preprocedure Evaluation (Signed)
Anesthesia Evaluation  Patient identified by MRN, date of birth, ID band Patient awake    Reviewed: Allergy & Precautions, H&P , NPO status , Patient's Chart, lab work & pertinent test results, reviewed documented beta blocker date and time   Airway Mallampati: I  TM Distance: >3 FB Neck ROM: full    Dental no notable dental hx.    Pulmonary neg pulmonary ROS, sleep apnea , Current Smoker,    Pulmonary exam normal breath sounds clear to auscultation       Cardiovascular Exercise Tolerance: Good hypertension, + CAD  negative cardio ROS   Rhythm:regular Rate:Normal     Neuro/Psych Seizures -,  Anxiety Depression Bipolar Disorder Borderlinenegative neurological ROS  negative psych ROS   GI/Hepatic negative GI ROS, Neg liver ROS, GERD  ,  Endo/Other  negative endocrine ROS  Renal/GU negative Renal ROS  negative genitourinary   Musculoskeletal   Abdominal   Peds  Hematology negative hematology ROS (+)   Anesthesia Other Findings   Reproductive/Obstetrics negative OB ROS                             Anesthesia Physical Anesthesia Plan  ASA: III  Anesthesia Plan: General   Post-op Pain Management:    Induction:   PONV Risk Score and Plan:   Airway Management Planned:   Additional Equipment:   Intra-op Plan:   Post-operative Plan:   Informed Consent: I have reviewed the patients History and Physical, chart, labs and discussed the procedure including the risks, benefits and alternatives for the proposed anesthesia with the patient or authorized representative who has indicated his/her understanding and acceptance.   Dental Advisory Given  Plan Discussed with: CRNA  Anesthesia Plan Comments:         Anesthesia Quick Evaluation

## 2018-01-14 NOTE — Anesthesia Postprocedure Evaluation (Signed)
Anesthesia Post Note  Patient: Jermaine [name]  Procedure(s) Performed: LEFT HYDROCELECTOMY (Left )  Patient location during evaluation: PACU Anesthesia Type: General Level of consciousness: awake and patient cooperative Pain management: pain level controlled Vital Signs Assessment: post-procedure vital signs reviewed and stable Respiratory status: spontaneous breathing, nonlabored ventilation and respiratory function stable Cardiovascular status: blood pressure returned to baseline Postop Assessment: no apparent nausea or vomiting Anesthetic complications: no     Last Vitals:  Vitals:   01/14/18 1041  BP: 106/62  Pulse: 67  Resp: 16  Temp: 36.6 C  SpO2: 93%    Last Pain:  Vitals:   01/14/18 1041  TempSrc: Oral  PainSc: 3                  Anely Spiewak J

## 2018-01-17 ENCOUNTER — Encounter (HOSPITAL_COMMUNITY): Payer: Self-pay | Admitting: Urology

## 2018-01-18 ENCOUNTER — Telehealth: Payer: Self-pay | Admitting: Internal Medicine

## 2018-01-18 DIAGNOSIS — K5900 Constipation, unspecified: Secondary | ICD-10-CM

## 2018-01-18 NOTE — Telephone Encounter (Signed)
[phone] please call patient. His fiance called stating that the patient can not go to the bathroom and they need to know what to do

## 2018-01-18 NOTE — Telephone Encounter (Signed)
Pts significant there states that Pt has only had one bowel movement since RMR was going to due a TCS on pt. PTs next appointment is June 10/2017. Pt would like to know what is he suppose to do in the meantime if he isn't having bowel movents. Pt is taking Amitizia 24 mcg twice daily, Miralax once daily, otc 2 stool softeners twice daily. Pt reports no abdominal pain, mild discomfort felt, no nausea this week. PT was suppose to have a procedure last week EEG last week and they weren't able to complete the procedure due to severe vomiting. Pt hasn't been admitted to the hospital and hasn't gone to the ED. Please advise.

## 2018-01-19 ENCOUNTER — Telehealth: Payer: Self-pay | Admitting: Internal Medicine

## 2018-01-19 NOTE — Telephone Encounter (Signed)
Lmom, pt is aware of the next steps needed. Documentation is in other phone note.

## 2018-01-19 NOTE — Telephone Encounter (Signed)
Order entered for acute abdominal series, no appt needed. Tried to call his significant other, no answer, LMOVM.

## 2018-01-19 NOTE — Telephone Encounter (Signed)
Routing message to [facility] clinical pool. Lmom, pt is aware of what's needed.

## 2018-01-19 NOTE — Telephone Encounter (Signed)
[name] wife, Janett Billow, was calling to speak to the nurse about what was next with plan of care for patient. Please call her back at [phone]

## 2018-01-19 NOTE — Addendum Note (Signed)
Addended by: Hassan Rowan on: 01/19/2018 04:20 PM   Modules accepted: Orders

## 2018-01-19 NOTE — Telephone Encounter (Signed)
Patient needs an acute abdominal series to size up stool, etc.

## 2018-01-28 ENCOUNTER — Ambulatory Visit (HOSPITAL_COMMUNITY)
Admission: RE | Admit: 2018-01-28 | Discharge: 2018-01-28 | Disposition: A | Discharge status: Home / Self Care | Payer: Medicaid Other | Source: Ambulatory Visit | Attending: Internal Medicine | Admitting: Internal Medicine

## 2018-01-28 ENCOUNTER — Ambulatory Visit (INDEPENDENT_AMBULATORY_CARE_PROVIDER_SITE_OTHER): Payer: Medicaid Other | Admitting: Urology

## 2018-01-28 DIAGNOSIS — K5900 Constipation, unspecified: Secondary | ICD-10-CM | POA: Insufficient documentation

## 2018-01-28 DIAGNOSIS — N430 Encysted hydrocele: Secondary | ICD-10-CM

## 2018-02-01 ENCOUNTER — Other Ambulatory Visit: Payer: Self-pay

## 2018-02-01 MED ORDER — PEG 3350-KCL-NA BICARB-NACL 420 G PO SOLR: 4000.0000 mL | ORAL | 0 refills | Status: DC

## 2018-02-07 ENCOUNTER — Telehealth: Payer: Self-pay

## 2018-02-07 NOTE — Telephone Encounter (Signed)
Opened in error

## 2018-02-08 ENCOUNTER — Inpatient Hospital Stay (HOSPITAL_COMMUNITY)
Admission: EM | Admit: 2018-02-08 | Discharge: 2018-02-15 | DRG: 392 | Disposition: A | Discharge status: Home / Self Care | Payer: Medicaid Other | Attending: Internal Medicine | Admitting: Internal Medicine

## 2018-02-08 ENCOUNTER — Encounter (HOSPITAL_COMMUNITY): Payer: Self-pay | Admitting: Emergency Medicine

## 2018-02-08 ENCOUNTER — Emergency Department (HOSPITAL_COMMUNITY): Payer: Medicaid Other

## 2018-02-08 ENCOUNTER — Other Ambulatory Visit: Payer: Self-pay

## 2018-02-08 DIAGNOSIS — K5903 Drug induced constipation: Secondary | ICD-10-CM | POA: Diagnosis not present

## 2018-02-08 DIAGNOSIS — Z716 Tobacco abuse counseling: Secondary | ICD-10-CM

## 2018-02-08 DIAGNOSIS — F39 Unspecified mood [affective] disorder: Secondary | ICD-10-CM

## 2018-02-08 DIAGNOSIS — Z823 Family history of stroke: Secondary | ICD-10-CM

## 2018-02-08 DIAGNOSIS — F419 Anxiety disorder, unspecified: Secondary | ICD-10-CM | POA: Diagnosis present

## 2018-02-08 DIAGNOSIS — K5900 Constipation, unspecified: Secondary | ICD-10-CM | POA: Diagnosis not present

## 2018-02-08 DIAGNOSIS — D696 Thrombocytopenia, unspecified: Secondary | ICD-10-CM | POA: Diagnosis present

## 2018-02-08 DIAGNOSIS — Z6828 Body mass index (BMI) 28.0-28.9, adult: Secondary | ICD-10-CM

## 2018-02-08 DIAGNOSIS — I2510 Atherosclerotic heart disease of native coronary artery without angina pectoris: Secondary | ICD-10-CM | POA: Diagnosis present

## 2018-02-08 DIAGNOSIS — Y92009 Unspecified place in unspecified non-institutional (private) residence as the place of occurrence of the external cause: Secondary | ICD-10-CM

## 2018-02-08 DIAGNOSIS — Z833 Family history of diabetes mellitus: Secondary | ICD-10-CM

## 2018-02-08 DIAGNOSIS — Z800 Family history of malignant neoplasm of digestive organs: Secondary | ICD-10-CM

## 2018-02-08 DIAGNOSIS — D649 Anemia, unspecified: Secondary | ICD-10-CM | POA: Diagnosis present

## 2018-02-08 DIAGNOSIS — E785 Hyperlipidemia, unspecified: Secondary | ICD-10-CM | POA: Diagnosis present

## 2018-02-08 DIAGNOSIS — T402X5A Adverse effect of other opioids, initial encounter: Secondary | ICD-10-CM | POA: Diagnosis present

## 2018-02-08 DIAGNOSIS — I10 Essential (primary) hypertension: Secondary | ICD-10-CM | POA: Diagnosis present

## 2018-02-08 DIAGNOSIS — Z5309 Procedure and treatment not carried out because of other contraindication: Secondary | ICD-10-CM | POA: Diagnosis present

## 2018-02-08 DIAGNOSIS — Z87438 Personal history of other diseases of male genital organs: Secondary | ICD-10-CM

## 2018-02-08 DIAGNOSIS — F1120 Opioid dependence, uncomplicated: Secondary | ICD-10-CM | POA: Diagnosis present

## 2018-02-08 DIAGNOSIS — K219 Gastro-esophageal reflux disease without esophagitis: Secondary | ICD-10-CM | POA: Diagnosis present

## 2018-02-08 DIAGNOSIS — Z885 Allergy status to narcotic agent status: Secondary | ICD-10-CM

## 2018-02-08 DIAGNOSIS — G40909 Epilepsy, unspecified, not intractable, without status epilepticus: Secondary | ICD-10-CM | POA: Diagnosis present

## 2018-02-08 DIAGNOSIS — R634 Abnormal weight loss: Secondary | ICD-10-CM | POA: Diagnosis present

## 2018-02-08 DIAGNOSIS — G4730 Sleep apnea, unspecified: Secondary | ICD-10-CM | POA: Diagnosis present

## 2018-02-08 DIAGNOSIS — F603 Borderline personality disorder: Secondary | ICD-10-CM | POA: Diagnosis present

## 2018-02-08 DIAGNOSIS — Z79899 Other long term (current) drug therapy: Secondary | ICD-10-CM

## 2018-02-08 DIAGNOSIS — F17210 Nicotine dependence, cigarettes, uncomplicated: Secondary | ICD-10-CM | POA: Diagnosis present

## 2018-02-08 DIAGNOSIS — F319 Bipolar disorder, unspecified: Secondary | ICD-10-CM | POA: Diagnosis present

## 2018-02-08 DIAGNOSIS — Z720 Tobacco use: Secondary | ICD-10-CM

## 2018-02-08 DIAGNOSIS — Z8249 Family history of ischemic heart disease and other diseases of the circulatory system: Secondary | ICD-10-CM

## 2018-02-08 LAB — COMPREHENSIVE METABOLIC PANEL
ALBUMIN: 3.6 g/dL (ref 3.5–5.0)
ALK PHOS: 51 U/L (ref 38–126)
ALT: 13 U/L — ABNORMAL LOW (ref 17–63)
AST: 20 U/L (ref 15–41)
Anion gap: 6 (ref 5–15)
BILIRUBIN TOTAL: 0.3 mg/dL (ref 0.3–1.2)
BUN: 12 mg/dL (ref 6–20)
CALCIUM: 8.4 mg/dL — AB (ref 8.9–10.3)
CO2: 27 mmol/L (ref 22–32)
Chloride: 102 mmol/L (ref 101–111)
Creatinine, Ser: 0.97 mg/dL (ref 0.61–1.24)
GFR calc Af Amer: >60 mL/min (ref >60)
GLUCOSE: 92 mg/dL (ref 65–99)
POTASSIUM: 3.7 mmol/L (ref 3.5–5.1)
Sodium: 135 mmol/L (ref 135–145)
TOTAL PROTEIN: 6 g/dL — AB (ref 6.5–8.1)

## 2018-02-08 LAB — CBC WITH DIFFERENTIAL/PLATELET
Basophils Absolute: 0 10*3/uL (ref 0.0–0.1)
Basophils Relative: 0 %
Eosinophils Absolute: 0.3 10*3/uL (ref 0.0–0.7)
Eosinophils Relative: 4 %
HEMATOCRIT: 38.6 % — AB (ref 39.0–52.0)
HEMOGLOBIN: 12.8 g/dL — AB (ref 13.0–17.0)
LYMPHS PCT: 35 %
Lymphs Abs: 2.3 10*3/uL (ref 0.7–4.0)
MCH: 31 pg (ref 26.0–34.0)
MCHC: 33.2 g/dL (ref 30.0–36.0)
MCV: 93.5 fL (ref 78.0–100.0)
MONO ABS: 0.5 10*3/uL (ref 0.1–1.0)
MONOS PCT: 7 %
NEUTROS ABS: 3.5 10*3/uL (ref 1.7–7.7)
NEUTROS PCT: 54 %
Platelets: 154 10*3/uL (ref 150–400)
RBC: 4.13 MIL/uL — ABNORMAL LOW (ref 4.22–5.81)
RDW: 12.8 % (ref 11.5–15.5)
WBC: 6.5 10*3/uL (ref 4.0–10.5)

## 2018-02-08 MED ORDER — PEG 3350-KCL-NA BICARB-NACL 420 G PO SOLR: 2000.0000 mL | Freq: Once | ORAL | Status: DC

## 2018-02-08 MED ORDER — PANTOPRAZOLE SODIUM 40 MG PO TBEC
40.0000 mg | DELAYED_RELEASE_TABLET | Freq: Every day | ORAL | Status: DC
  Administered 2018-02-09 – 2018-02-10 (×2): 40 mg via ORAL
  Filled 2018-02-08 (×2): qty 1

## 2018-02-08 MED ORDER — ONDANSETRON HCL 4 MG PO TABS
4.0000 mg | ORAL_TABLET | Freq: Four times a day (QID) | ORAL | Status: DC | PRN
  Administered 2018-02-12: 4 mg via ORAL
  Filled 2018-02-08: qty 1

## 2018-02-08 MED ORDER — DOCUSATE SODIUM 100 MG PO CAPS
100.0000 mg | ORAL_CAPSULE | Freq: Every day | ORAL | Status: DC
  Administered 2018-02-09 – 2018-02-14 (×6): 100 mg via ORAL
  Filled 2018-02-08 (×6): qty 1

## 2018-02-08 MED ORDER — MILK AND MOLASSES ENEMA
1.0000 | Freq: Once | RECTAL | Status: AC
  Administered 2018-02-08: 250 mL via RECTAL

## 2018-02-08 MED ORDER — SIMVASTATIN 10 MG PO TABS
10.0000 mg | ORAL_TABLET | Freq: Every day | ORAL | Status: DC
  Administered 2018-02-09 – 2018-02-14 (×6): 10 mg via ORAL
  Filled 2018-02-08 (×6): qty 1

## 2018-02-08 MED ORDER — NALOXEGOL OXALATE 25 MG PO TABS
25.0000 mg | ORAL_TABLET | Freq: Every day | ORAL | Status: DC
  Administered 2018-02-08 – 2018-02-14 (×7): 25 mg via ORAL
  Filled 2018-02-08 (×9): qty 1

## 2018-02-08 MED ORDER — QUETIAPINE FUMARATE 100 MG PO TABS
200.0000 mg | ORAL_TABLET | Freq: Every day | ORAL | Status: DC
  Administered 2018-02-08 – 2018-02-14 (×7): 200 mg via ORAL
  Filled 2018-02-08 (×7): qty 2

## 2018-02-08 MED ORDER — ONDANSETRON HCL 4 MG/2ML IJ SOLN
4.0000 mg | Freq: Four times a day (QID) | INTRAMUSCULAR | Status: DC | PRN
  Administered 2018-02-14 (×2): 4 mg via INTRAVENOUS
  Filled 2018-02-08 (×2): qty 2

## 2018-02-08 MED ORDER — SODIUM CHLORIDE 0.9 % IV SOLN: 250.0000 mL | INTRAVENOUS | Status: DC | PRN

## 2018-02-08 MED ORDER — LUBIPROSTONE 24 MCG PO CAPS
24.0000 ug | ORAL_CAPSULE | Freq: Two times a day (BID) | ORAL | Status: DC
  Administered 2018-02-09: 24 ug via ORAL
  Filled 2018-02-08: qty 1

## 2018-02-08 MED ORDER — NICOTINE 14 MG/24HR TD PT24
14.0000 mg | MEDICATED_PATCH | Freq: Every day | TRANSDERMAL | Status: DC
  Administered 2018-02-08 – 2018-02-14 (×7): 14 mg via TRANSDERMAL
  Filled 2018-02-08 (×8): qty 1

## 2018-02-08 MED ORDER — LITHIUM CARBONATE 150 MG PO CAPS
300.0000 mg | ORAL_CAPSULE | Freq: Three times a day (TID) | ORAL | Status: DC
  Administered 2018-02-08 – 2018-02-14 (×19): 300 mg via ORAL
  Filled 2018-02-08 (×19): qty 2

## 2018-02-08 MED ORDER — BUPRENORPHINE HCL 2 MG SL SUBL
8.0000 mg | SUBLINGUAL_TABLET | Freq: Two times a day (BID) | SUBLINGUAL | Status: DC
  Administered 2018-02-08 – 2018-02-14 (×13): 8 mg via SUBLINGUAL
  Filled 2018-02-08 (×13): qty 4

## 2018-02-08 MED ORDER — CLONIDINE HCL 0.1 MG PO TABS
0.1000 mg | ORAL_TABLET | Freq: Two times a day (BID) | ORAL | Status: DC
  Administered 2018-02-08 – 2018-02-14 (×12): 0.1 mg via ORAL
  Filled 2018-02-08 (×13): qty 1

## 2018-02-08 MED ORDER — IOPAMIDOL (ISOVUE-300) INJECTION 61%
100.0000 mL | Freq: Once | INTRAVENOUS | Status: AC | PRN
  Administered 2018-02-08: 100 mL via INTRAVENOUS

## 2018-02-08 MED ORDER — SODIUM CHLORIDE 0.9% FLUSH: 3.0000 mL | INTRAVENOUS | Status: DC | PRN

## 2018-02-08 MED ORDER — HYDROXYZINE HCL 25 MG PO TABS: 25.0000 mg | ORAL_TABLET | Freq: Three times a day (TID) | ORAL | Status: DC | PRN

## 2018-02-08 MED ORDER — FLEET ENEMA 7-19 GM/118ML RE ENEM
1.0000 | ENEMA | Freq: Once | RECTAL | Status: AC | PRN
  Administered 2018-02-08: 1 via RECTAL

## 2018-02-08 MED ORDER — DIVALPROEX SODIUM 250 MG PO DR TAB
500.0000 mg | DELAYED_RELEASE_TABLET | Freq: Three times a day (TID) | ORAL | Status: DC
  Administered 2018-02-08 – 2018-02-14 (×19): 500 mg via ORAL
  Filled 2018-02-08 (×19): qty 2

## 2018-02-08 MED ORDER — LAMOTRIGINE 100 MG PO TABS
100.0000 mg | ORAL_TABLET | Freq: Two times a day (BID) | ORAL | Status: DC
  Administered 2018-02-08 – 2018-02-14 (×13): 100 mg via ORAL
  Filled 2018-02-08 (×13): qty 1

## 2018-02-08 MED ORDER — SODIUM CHLORIDE 0.9% FLUSH
3.0000 mL | Freq: Two times a day (BID) | INTRAVENOUS | Status: DC
  Administered 2018-02-08 – 2018-02-14 (×13): 3 mL via INTRAVENOUS

## 2018-02-08 MED ORDER — METOPROLOL TARTRATE 50 MG PO TABS
50.0000 mg | ORAL_TABLET | Freq: Two times a day (BID) | ORAL | Status: DC
  Administered 2018-02-08 – 2018-02-15 (×11): 50 mg via ORAL
  Filled 2018-02-08 (×14): qty 1

## 2018-02-08 MED ORDER — ALBUTEROL SULFATE (2.5 MG/3ML) 0.083% IN NEBU
3.0000 mL | INHALATION_SOLUTION | RESPIRATORY_TRACT | Status: DC | PRN
Start: 2018-02-08 — End: 2018-02-15

## 2018-02-08 MED ORDER — LEVETIRACETAM 500 MG PO TABS
1500.0000 mg | ORAL_TABLET | Freq: Two times a day (BID) | ORAL | Status: DC
  Administered 2018-02-08 – 2018-02-14 (×13): 1500 mg via ORAL
  Filled 2018-02-08: qty 3
  Filled 2018-02-08: qty 6
  Filled 2018-02-08 (×11): qty 3

## 2018-02-08 MED ORDER — TRAZODONE HCL 50 MG PO TABS
300.0000 mg | ORAL_TABLET | Freq: Every day | ORAL | Status: DC
  Administered 2018-02-08 – 2018-02-14 (×7): 300 mg via ORAL
  Filled 2018-02-08 (×7): qty 6

## 2018-02-08 NOTE — ED Notes (Signed)
Pt was only able to hold enema for 10 minutes
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
## 2018-02-08 NOTE — ED Notes (Addendum)
Pt has been using stool softeners and laxatives. Used a colon prep as well. Has taken 2 enemas as well with no relief. Per pt. Dr. Gala Romney wanted pt to get a CT scan. Has had an endoscopy and colonoscopy done.
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
## 2018-02-08 NOTE — ED Notes (Signed)
Pt has small ball of stool per rectum about size of 50cent piece. Report given to Lisa,RN

## 2018-02-08 NOTE — ED Triage Notes (Signed)
Pt was sent by Rourk to have a CT scan of abdomen.  Pt states "I havent used the bathroom in 42 days".  C/o of constipation, abdominal pain, and nausea.

## 2018-02-09 DIAGNOSIS — F17210 Nicotine dependence, cigarettes, uncomplicated: Secondary | ICD-10-CM

## 2018-02-09 DIAGNOSIS — Z720 Tobacco use: Secondary | ICD-10-CM

## 2018-02-09 DIAGNOSIS — D696 Thrombocytopenia, unspecified: Secondary | ICD-10-CM | POA: Diagnosis not present

## 2018-02-09 DIAGNOSIS — F1120 Opioid dependence, uncomplicated: Secondary | ICD-10-CM | POA: Diagnosis not present

## 2018-02-09 DIAGNOSIS — F39 Unspecified mood [affective] disorder: Secondary | ICD-10-CM | POA: Diagnosis not present

## 2018-02-09 DIAGNOSIS — F11288 Opioid dependence with other opioid-induced disorder: Secondary | ICD-10-CM | POA: Diagnosis not present

## 2018-02-09 DIAGNOSIS — K5903 Drug induced constipation: Secondary | ICD-10-CM | POA: Diagnosis not present

## 2018-02-09 DIAGNOSIS — K5900 Constipation, unspecified: Secondary | ICD-10-CM | POA: Diagnosis not present

## 2018-02-09 MED ORDER — BISACODYL 5 MG PO TBEC
10.0000 mg | DELAYED_RELEASE_TABLET | Freq: Once | ORAL | Status: AC
  Administered 2018-02-09: 10 mg via ORAL
  Filled 2018-02-09: qty 2

## 2018-02-09 MED ORDER — POLYETHYLENE GLYCOL 3350 17 G PO PACK
17.0000 g | PACK | ORAL | Status: AC
  Administered 2018-02-09 (×3): 17 g via ORAL
  Filled 2018-02-09 (×3): qty 1

## 2018-02-09 MED ORDER — MILK AND MOLASSES ENEMA
1.0000 | Freq: Once | RECTAL | Status: AC
  Administered 2018-02-09: 250 mL via RECTAL

## 2018-02-09 MED ORDER — MILK AND MOLASSES ENEMA: 1.0000 | Freq: Once | RECTAL | Status: AC

## 2018-02-09 NOTE — Progress Notes (Addendum)
PROGRESS NOTE  Jermaine [name] EGB:[id number] DOB: Sep 05, 1981 DOA: 02/08/2018 PCP: [name], MD  Brief History:  37 year old male with a history of opioid dependence, bipolar disorder, seizure like activity, hypertension, borderline personality disorder presenting with constipation without a bowel movement for over 40 days.  The patient has been on chronic buprenorphine which he has been getting from Dr. Valetta Close.  He denies any other new opioids except for the oxycodone he received after his surgical procedure on 01/14/2018 when he underwent a left hydrocelectomy.  He states that he drank 1 preparation of GoLYTELY on 02/07/2018 without a bowel movement.  He states that he was taking Amitiza along with MiraLAX and Senokot at home.  He complains of intermittent episodes of vomiting lasting 20 to 30 minutes. ; he does state he had a small, thin stool about width of pinky around 5/18. Denies any abdominal pain unless abdomen is palpated.  02/08/2018 CT abdomen and pelvis was essentially negative for acute findings except for moderate to large amount of stool throughout the colon without inflammatory process.  GI was consulted to assist with management.   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 Krista Blue -continue keppra, depakote  Hypertension -Continue clonidine and metoprolol tartrate  Bipolar disorder/borderline personality  disorder -Continue Depakote, lithium, Seroquel, trazodone, Lamictal  Tobacco abuse -I have discussed tobacco cessation with the patient.  I have counseled the patient regarding the negative impacts of continued tobacco use including but not limited to lung cancer, COPD, and cardiovascular disease.  I have discussed alternatives to tobacco and modalities that may help facilitate tobacco cessation including but not limited to biofeedback, hypnosis, and medications.  Total time spent with tobacco counseling was 4 minutes.     Disposition Plan:   Home when cleared by GI  Family Communication:   No Family at bedside--Total time spent 35 minutes.  Greater than 50% spent face to face counseling and coordinating care.   Consultants:  GI  Code Status:  FULL  DVT Prophylaxis:  SCDs   Procedures: As Listed in Progress Note Above  Antibiotics: None    Subjective: Patient states that he has not had a bowel movement yet this admission.  He denies any nausea, vomiting, diarrhea, abdominal pain, headache, neck pain.  There is no chest pain or shortness of breath.  Objective: Vitals:   02/08/18 1727 02/08/18 2000 02/08/18 2100 02/09/18 0452  BP: (!) 157/114 (!) 165/135 (!) 175/113 135/86  Pulse: 69 68 70 68  Resp: 15 18 18 18   Temp: 97.9 F (36.6 C) 98.2 F (36.8 C) 98.3 F (36.8 C) 97.6 F (36.4 C)  TempSrc: Oral Oral Oral Oral  SpO2: 100% 100% 100% 100%  Weight: 94.5 kg (208 lb 6.4 oz)     Height:       No intake or output data in the 24 hours ending 02/09/18 1132 Weight change:  Exam:   General:  Pt is alert, follows commands appropriately, not in acute distress  HEENT: No icterus,  No thrush, No neck mass, [place]/AT  Cardiovascular: RRR, S1/S2, no rubs, no gallops  Respiratory: CTA bilaterally, no wheezing, no crackles, no rhonchi  Abdomen: Soft/+BS, diffuse mild tenderness, non distended, no guarding  Extremities: No edema, No lymphangitis, No petechiae, No rashes, no  synovitis   Data Reviewed: I have personally reviewed following labs and imaging studies Basic Metabolic Panel: Recent Labs  Lab 02/08/18 1239  NA 135  K 3.7  CL 102  CO2 27  GLUCOSE 92  BUN 12  CREATININE 0.97  CALCIUM 8.4*   Liver Function Tests: Recent Labs  Lab 02/08/18 1239  AST 20  ALT 13*  ALKPHOS 51  BILITOT 0.3  PROT 6.0*  ALBUMIN 3.6   No results for input(s): LIPASE, AMYLASE in the last 168 hours. No results for input(s): AMMONIA in the last 168 hours. Coagulation Profile: No results for input(s): INR, PROTIME in the last 168 hours. CBC: Recent Labs  Lab 02/08/18 1239  WBC 6.5  NEUTROABS 3.5  HGB 12.8*  HCT 38.6*  MCV 93.5  PLT 154   Cardiac Enzymes: No results for input(s): CKTOTAL, CKMB, CKMBINDEX, TROPONINI in the last 168 hours. BNP: Invalid input(s): POCBNP CBG: No results for input(s): GLUCAP in the last 168 hours. HbA1C: No results for input(s): HGBA1C in the last 72 hours. Urine analysis:    Component Value Date/Time   COLORURINE STRAW (A) 11/14/2017 2108   APPEARANCEUR CLEAR 11/14/2017 2108   LABSPEC 1.008 11/14/2017 2108   PHURINE 8.0 11/14/2017 2108   GLUCOSEU NEGATIVE 11/14/2017 2108   HGBUR NEGATIVE 11/14/2017 2108   BILIRUBINUR NEGATIVE 11/14/2017 2108   KETONESUR 20 (A) 11/14/2017 2108   PROTEINUR NEGATIVE 11/14/2017 2108   UROBILINOGEN 0.2 12/18/2009 1312   NITRITE NEGATIVE 11/14/2017 2108   LEUKOCYTESUR NEGATIVE 11/14/2017 2108   Sepsis Labs: @LABRCNTIP (procalcitonin:4,lacticidven:4) )No results found for this or any previous visit (from the past 240 hour(s)).   Scheduled Meds: . buprenorphine  8 mg Sublingual BID  . cloNIDine  0.1 mg Oral BID  . divalproex  500 mg Oral Q8H  . docusate sodium  100 mg Oral Daily  . lamoTRIgine  100 mg Oral BID  . levETIRAcetam  1,500 mg Oral BID  . lithium carbonate  300 mg Oral TID WC  . lubiprostone  24 mcg Oral BID WC  . metoprolol tartrate  50 mg Oral BID  . milk and  molasses  1 enema Rectal Once  . milk and molasses  1 enema Rectal Once  . naloxegol oxalate  25 mg Oral Daily  . nicotine  14 mg Transdermal Daily  . pantoprazole  40 mg Oral Daily  . polyethylene glycol  17 g Oral Q1H  . QUEtiapine  200 mg Oral QHS  . simvastatin  10 mg Oral q1800  . sodium chloride flush  3 mL Intravenous Q12H  . trazodone  300 mg Oral QHS   Continuous Infusions: . sodium chloride      Procedures/Studies: Ct Abdomen Pelvis W Contrast  Result Date: 02/08/2018 CLINICAL DATA:  Constipation and nausea and vomiting. EXAM: CT ABDOMEN AND PELVIS WITH CONTRAST TECHNIQUE: Multidetector CT imaging of the abdomen and pelvis was performed using the standard protocol following bolus administration of intravenous contrast. CONTRAST:  153mL ISOVUE-300 IOPAMIDOL (ISOVUE-300) INJECTION 61% COMPARISON:  CT scan 11/14/2017 FINDINGS: Lower chest: The lung bases are clear of acute process. No pleural effusion or pulmonary lesions. The heart is normal in size. No pericardial effusion. The distal esophagus and aorta are unremarkable.  Hepatobiliary: No focal hepatic lesions or intrahepatic biliary dilatation. The gallbladder is normal. No common bile duct dilatation. Pancreas: No mass, inflammation or ductal dilatation. Spleen: Normal size.  No focal lesions. Adrenals/Urinary Tract: The adrenal glands and kidneys are unremarkable. No renal, ureteral or bladder calculi or mass. Stomach/Bowel: The stomach, duodenum, small bowel and terminal ileum are normal. The appendix is surgically absent. There is a moderate to large amount of stool throughout the colon and down into the rectum suggesting constipation. No acute inflammatory process, mass lesions or obstructive findings. Vascular/Lymphatic: The aorta is normal in caliber. No dissection. The branch vessels are patent. The major venous structures are patent. No mesenteric or retroperitoneal mass or adenopathy. Small scattered lymph nodes are noted.  Reproductive: The prostate gland and seminal vesicles are unremarkable. Suspect left-sided scrotal varicocele. Other: No pelvic mass or adenopathy. No free pelvic fluid collections. No inguinal mass or adenopathy. No abdominal wall hernia or subcutaneous lesions. Musculoskeletal: No significant bony findings. IMPRESSION: 1. No acute abdominal/pelvic findings, mass lesions or lymphadenopathy. 2. Moderate to large amount of stool throughout the colon and down into the rectum suggesting constipation. Electronically Signed   By: Marijo Sanes M.D.   On: 02/08/2018 14:14   Dg Abd Acute W/chest  Result Date: 01/29/2018 CLINICAL DATA:  Constipation, unspecified EXAM: DG ABDOMEN ACUTE W/ 1V CHEST COMPARISON:  11/14/2017 abdominal CT FINDINGS: History of constipation with formed stool seen from the cecum to rectum. No rectal distention. No evidence of small bowel obstruction. No concerning mass effect or calcification. On the chest view interstitial markings are somewhat coarsened but this is likely technical based on the other view and previous imaging. Normal heart size and mediastinal contours. IMPRESSION: Formed stool throughout the colon correlating with constipation history. No rectal distention or obstructive change. Electronically Signed   By: Monte Fantasia M.D.   On: 01/29/2018 06:26    Orson Eva, DO  [facility] Pager [phone]  If 7PM-7AM, please contact night-coverage [email or web address] Password [facility] 02/09/2018, 11:32 AM   LOS: 0 days

## 2018-02-09 NOTE — Progress Notes (Signed)
Subjective:  Small finger sized BM. "Nothing to write home about". Wants solid foods. No N/V. Refused Golytely prep, "I drank 5 gallons of it and it didn't work".  Objective: Vital signs in last 24 hours: Temp:  [97.6 F (36.4 C)-98.3 F (36.8 C)] 97.6 F (36.4 C) (05/22 0452) Pulse Rate:  [62-73] 68 (05/22 0452) Resp:  [15-18] 18 (05/22 0452) BP: (135-175)/(86-135) 135/86 (05/22 0452) SpO2:  [99 %-100 %] 100 % (05/22 0452) Weight:  [188 lb (85.3 kg)-208 lb 6.4 oz (94.5 kg)] 208 lb 6.4 oz (94.5 kg) (05/21 1727) Last BM Date: (per pt 42 days ago) General:   Alert,  Well-developed, well-nourished, pleasant and cooperative in NAD Head:  Normocephalic and atraumatic. Eyes:  Sclera clear, no icterus.  Abdomen:  Soft, nontender and nondistended.   Normal bowel sounds, without guarding, and without rebound.   Extremities:  Without clubbing, deformity or edema. Neurologic:  Alert and  oriented x4;  grossly normal neurologically. Skin:  Intact without significant lesions or rashes. Psych:  Alert and cooperative. Normal mood and affect.  Intake/Output from previous day: No intake/output data recorded. Intake/Output this shift: No intake/output data recorded.  Lab Results: CBC Recent Labs    02/08/18 1239  WBC 6.5  HGB 12.8*  HCT 38.6*  MCV 93.5  PLT 154   BMET Recent Labs    02/08/18 1239  NA 135  K 3.7  CL 102  CO2 27  GLUCOSE 92  BUN 12  CREATININE 0.97  CALCIUM 8.4*   LFTs Recent Labs    02/08/18 1239  BILITOT 0.3  ALKPHOS 51  AST 20  ALT 13*  PROT 6.0*  ALBUMIN 3.6   No results for input(s): LIPASE in the last 72 hours. PT/INR No results for input(s): LABPROT, INR in the last 72 hours.    Imaging Studies: Ct Abdomen Pelvis W Contrast  Result Date: 02/08/2018 CLINICAL DATA:  Constipation and nausea and vomiting. EXAM: CT ABDOMEN AND PELVIS WITH CONTRAST TECHNIQUE: Multidetector CT imaging of the abdomen and pelvis was performed using the standard  protocol following bolus administration of intravenous contrast. CONTRAST:  170mL ISOVUE-300 IOPAMIDOL (ISOVUE-300) INJECTION 61% COMPARISON:  CT scan 11/14/2017 FINDINGS: Lower chest: The lung bases are clear of acute process. No pleural effusion or pulmonary lesions. The heart is normal in size. No pericardial effusion. The distal esophagus and aorta are unremarkable. Hepatobiliary: No focal hepatic lesions or intrahepatic biliary dilatation. The gallbladder is normal. No common bile duct dilatation. Pancreas: No mass, inflammation or ductal dilatation. Spleen: Normal size.  No focal lesions. Adrenals/Urinary Tract: The adrenal glands and kidneys are unremarkable. No renal, ureteral or bladder calculi or mass. Stomach/Bowel: The stomach, duodenum, small bowel and terminal ileum are normal. The appendix is surgically absent. There is a moderate to large amount of stool throughout the colon and down into the rectum suggesting constipation. No acute inflammatory process, mass lesions or obstructive findings. Vascular/Lymphatic: The aorta is normal in caliber. No dissection. The branch vessels are patent. The major venous structures are patent. No mesenteric or retroperitoneal mass or adenopathy. Small scattered lymph nodes are noted. Reproductive: The prostate gland and seminal vesicles are unremarkable. Suspect left-sided scrotal varicocele. Other: No pelvic mass or adenopathy. No free pelvic fluid collections. No inguinal mass or adenopathy. No abdominal wall hernia or subcutaneous lesions. Musculoskeletal: No significant bony findings. IMPRESSION: 1. No acute abdominal/pelvic findings, mass lesions or lymphadenopathy. 2. Moderate to large amount of stool throughout the colon and down into the rectum  suggesting constipation. Electronically Signed   By: Marijo Sanes M.D.   On: 02/08/2018 14:14   Dg Abd Acute W/chest  Result Date: 01/29/2018 CLINICAL DATA:  Constipation, unspecified EXAM: DG ABDOMEN ACUTE W/ 1V  CHEST COMPARISON:  11/14/2017 abdominal CT FINDINGS: History of constipation with formed stool seen from the cecum to rectum. No rectal distention. No evidence of small bowel obstruction. No concerning mass effect or calcification. On the chest view interstitial markings are somewhat coarsened but this is likely technical based on the other view and previous imaging. Normal heart size and mediastinal contours. IMPRESSION: Formed stool throughout the colon correlating with constipation history. No rectal distention or obstructive change. Electronically Signed   By: Monte Fantasia M.D.   On: 01/29/2018 06:26  [2 weeks]   Assessment:   37 year old male presenting with obstipation, failing outpatient measures to include bowel prep, Amitiza 24 mcg BID, stool softeners, and Miralax. CT today without signs of obstruction and notable stool burden. Clinically, he does not appear in any distress.  Chronically on suboxone and recently had additional oxycodone s/p hydrocelectomy April 2019. Recent attempt at colonoscopy unsuccessful due to formed stool in rectum and sigmoid colon. TSH on file recently and normal.   Constipation/obstipation complicated by chronic opioid use and dysmotility. Failing Amitiza and multiple OTC agents. Declined rectal exam at time of consultation.    Weight loss is notable and concerning. Will need complete colonoscopy in future as last was incomplete due to presence of formed stool.  Patient reporting minimal results since admission. Still refusing golytely. Agreeable to further enemas.   Plan:  1. Milk of Molasses enema X 2 today. 2. Continue Movantik and Amitiza. 3. Continue full liquid diet.  4. Will give Dulcolax 10mg  followed by Miralax 17gram hourly for 5 hours.   Laureen Ochs. Bernarda Caffey [facility] [phone] 5/22/20199:05 AM     LOS: 0 days

## 2018-02-10 ENCOUNTER — Observation Stay (HOSPITAL_COMMUNITY): Payer: Medicaid Other

## 2018-02-10 DIAGNOSIS — D696 Thrombocytopenia, unspecified: Secondary | ICD-10-CM | POA: Diagnosis not present

## 2018-02-10 DIAGNOSIS — F1129 Opioid dependence with unspecified opioid-induced disorder: Secondary | ICD-10-CM | POA: Diagnosis not present

## 2018-02-10 DIAGNOSIS — F39 Unspecified mood [affective] disorder: Secondary | ICD-10-CM | POA: Diagnosis not present

## 2018-02-10 DIAGNOSIS — K5900 Constipation, unspecified: Secondary | ICD-10-CM | POA: Diagnosis not present

## 2018-02-10 DIAGNOSIS — F1120 Opioid dependence, uncomplicated: Secondary | ICD-10-CM | POA: Diagnosis not present

## 2018-02-10 DIAGNOSIS — Z720 Tobacco use: Secondary | ICD-10-CM

## 2018-02-10 LAB — BASIC METABOLIC PANEL WITH GFR
Anion gap: 6 (ref 5–15)
BUN: 11 mg/dL (ref 6–20)
CO2: 30 mmol/L (ref 22–32)
Calcium: 9 mg/dL (ref 8.9–10.3)
Chloride: 102 mmol/L (ref 101–111)
Creatinine, Ser: 0.93 mg/dL (ref 0.61–1.24)
GFR calc Af Amer: >60 mL/min
GFR calc non Af Amer: >60 mL/min
Glucose, Bld: 83 mg/dL (ref 65–99)
Potassium: 4 mmol/L (ref 3.5–5.1)
Sodium: 138 mmol/L (ref 135–145)

## 2018-02-10 LAB — CBC
HCT: 41.5 % (ref 39.0–52.0)
HEMOGLOBIN: 13.5 g/dL (ref 13.0–17.0)
MCH: 30.5 pg (ref 26.0–34.0)
MCHC: 32.5 g/dL (ref 30.0–36.0)
MCV: 93.9 fL (ref 78.0–100.0)
Platelets: 153 10*3/uL (ref 150–400)
RBC: 4.42 MIL/uL (ref 4.22–5.81)
RDW: 13.2 % (ref 11.5–15.5)
WBC: 6 10*3/uL (ref 4.0–10.5)

## 2018-02-10 LAB — MAGNESIUM: Magnesium: 2.2 mg/dL (ref 1.7–2.4)

## 2018-02-10 LAB — VITAMIN B12: Vitamin B-12: 524 pg/mL (ref 180–914)

## 2018-02-10 LAB — TSH: TSH: 1.24 u[IU]/mL (ref 0.350–4.500)

## 2018-02-10 MED ORDER — IOPAMIDOL (ISOVUE-300) INJECTION 61%
INTRAVENOUS | Status: AC
  Filled 2018-02-10: qty 600

## 2018-02-10 MED ORDER — IOPAMIDOL (ISOVUE-300) INJECTION 61%
INTRAVENOUS | Status: AC
  Administered 2018-02-10: 900 mL
  Filled 2018-02-10: qty 450

## 2018-02-10 NOTE — Progress Notes (Signed)
PROGRESS NOTE  Jermaine [name] SHF:[id number] DOB: [birth year] DOA: 02/08/2018 PCP: [name], MD  Brief History:  37 year old male with a history of opioid dependence, bipolar disorder, seizure like activity, hypertension, borderline personality disorder presenting with constipation without a bowel movement for over 40 days.  The patient has been on chronic buprenorphine which he has been getting from Dr. Valetta Close.  He denies any other new opioids except for the oxycodone he received after his surgical procedure on 01/14/2018 when he underwent a left hydrocelectomy.  He states that he drank 1 preparation of GoLYTELY on 02/07/2018 without a bowel movement.  He states that he was taking Amitiza along with MiraLAX and Senokot at home.  He complains of intermittent episodes of vomiting lasting 20 to 30 minutes. ; he does state he had a small, thin stool about width of pinky around 5/18. Denies any abdominal pain unless abdomen is palpated.  02/08/2018 CT abdomen and pelvis was essentially negative for acute findings except for moderate to large amount of stool throughout the colon without inflammatory process.  GI was consulted to assist with management.   Assessment/Plan: Constipation -Secondary to chronic opioid dependence -Appreciate GI follow-up -Continue bowel preparation including milk of molasses enema, Movantik, Amitiza -Continue Dulcolax and MiraLAX every hour x5 hours -still no BM-->general surgery consulted -water soluable enema-->made it to cecum -appreciate general surgery-->may need subtotal colectomy  Chronic opioid dependence -The [place] Controlled Substance Reporting System has been queried for this patient  -The patient last filled Percocet 5/325, #30 on 01/28/2018 -The patient last filled buprenorphine 8/2 #22 on 01/24/2018  Thrombocytopenia -chronic -B12--524 -TSH--1.240 -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  Krista Blue -continue keppra, depakote  Hypertension -Continue clonidine and metoprolol tartrate  Bipolar disorder/borderline personality disorder -Continue Depakote, lithium, Seroquel, trazodone, Lamictal  Tobacco abuse -I have discussed tobacco cessation with the patient.  I have counseled the patient regarding the negative impacts of continued tobacco use including but not limited to lung cancer, COPD, and cardiovascular disease.  I have discussed alternatives to tobacco and modalities that may help facilitate tobacco cessation including but not limited to biofeedback, hypnosis, and medications.  Total time spent with tobacco counseling was 4 minutes.     Disposition Plan:   Home when cleared by GI/general surgery  Family Communication:   No Family at bedside   Consultants:  GI  Code Status:  FULL  DVT Prophylaxis:  SCDs   Procedures: As Listed in Progress Note Above  Antibiotics: None    Subjective: Patient states that he still has not had a bowel movement.  He is passing flatus.  Denies any vomiting or worsening abdominal pain.  He denies any chest pain, shortness breath, fevers, chills.  Objective: Vitals:   02/09/18 0452 02/09/18 2047 02/10/18 0515 02/10/18 1005  BP: 135/86 102/61 94/60 130/72  Pulse: 68 (!) 48 (!) 53 65  Resp: 18     Temp: 97.6 F (36.4 C) 98.5 F (36.9 C) (!) 97.5 F (36.4 C)   TempSrc: Oral Oral Oral   SpO2: 100% 95% 96%   Weight:      Height:        Intake/Output Summary (Last 24 hours) at 02/10/2018 1705 Last data filed at 02/10/2018 1648 Gross per 24 hour  Intake -  Output 1 ml  Net -1 ml   Weight change:  Exam:   General:  Pt is alert, follows  commands appropriately, not in acute distress  HEENT: No icterus, No thrush, No neck mass, [place]/AT  Cardiovascular: RRR, S1/S2, no rubs, no gallops  Respiratory: CTA bilaterally, no wheezing, no crackles, no rhonchi  Abdomen: Soft/+BS, lower abdominal tender, non distended,  no guarding  Extremities: No edema, No lymphangitis, No petechiae, No rashes, no synovitis   Data Reviewed: I have personally reviewed following labs and imaging studies Basic Metabolic Panel: Recent Labs  Lab 02/08/18 1239 02/10/18 0642  NA 135 138  K 3.7 4.0  CL 102 102  CO2 27 30  GLUCOSE 92 83  BUN 12 11  CREATININE 0.97 0.93  CALCIUM 8.4* 9.0  MG  --  2.2   Liver Function Tests: Recent Labs  Lab 02/08/18 1239  AST 20  ALT 13*  ALKPHOS 51  BILITOT 0.3  PROT 6.0*  ALBUMIN 3.6   No results for input(s): LIPASE, AMYLASE in the last 168 hours. No results for input(s): AMMONIA in the last 168 hours. Coagulation Profile: No results for input(s): INR, PROTIME in the last 168 hours. CBC: Recent Labs  Lab 02/08/18 1239 02/10/18 0642  WBC 6.5 6.0  NEUTROABS 3.5  --   HGB 12.8* 13.5  HCT 38.6* 41.5  MCV 93.5 93.9  PLT 154 153   Cardiac Enzymes: No results for input(s): CKTOTAL, CKMB, CKMBINDEX, TROPONINI in the last 168 hours. BNP: Invalid input(s): POCBNP CBG: No results for input(s): GLUCAP in the last 168 hours. HbA1C: No results for input(s): HGBA1C in the last 72 hours. Urine analysis:    Component Value Date/Time   COLORURINE STRAW (A) 11/14/2017 2108   APPEARANCEUR CLEAR 11/14/2017 2108   LABSPEC 1.008 11/14/2017 2108   PHURINE 8.0 11/14/2017 2108   GLUCOSEU NEGATIVE 11/14/2017 2108   HGBUR NEGATIVE 11/14/2017 2108   BILIRUBINUR NEGATIVE 11/14/2017 2108   KETONESUR 20 (A) 11/14/2017 2108   PROTEINUR NEGATIVE 11/14/2017 2108   UROBILINOGEN 0.2 12/18/2009 1312   NITRITE NEGATIVE 11/14/2017 2108   LEUKOCYTESUR NEGATIVE 11/14/2017 2108   Sepsis Labs: @LABRCNTIP (procalcitonin:4,lacticidven:4) )No results found for this or any previous visit (from the past 240 hour(s)).   Scheduled Meds: . buprenorphine  8 mg Sublingual BID  . cloNIDine  0.1 mg Oral BID  . divalproex  500 mg Oral Q8H  . docusate sodium  100 mg Oral Daily  . iopamidol      .  lamoTRIgine  100 mg Oral BID  . levETIRAcetam  1,500 mg Oral BID  . lithium carbonate  300 mg Oral TID WC  . metoprolol tartrate  50 mg Oral BID  . naloxegol oxalate  25 mg Oral Daily  . nicotine  14 mg Transdermal Daily  . QUEtiapine  200 mg Oral QHS  . simvastatin  10 mg Oral q1800  . sodium chloride flush  3 mL Intravenous Q12H  . trazodone  300 mg Oral QHS   Continuous Infusions: . sodium chloride      Procedures/Studies: Ct Abdomen Pelvis W Contrast  Result Date: 02/08/2018 CLINICAL DATA:  Constipation and nausea and vomiting. EXAM: CT ABDOMEN AND PELVIS WITH CONTRAST TECHNIQUE: Multidetector CT imaging of the abdomen and pelvis was performed using the standard protocol following bolus administration of intravenous contrast. CONTRAST:  14mL ISOVUE-300 IOPAMIDOL (ISOVUE-300) INJECTION 61% COMPARISON:  CT scan 11/14/2017 FINDINGS: Lower chest: The lung bases are clear of acute process. No pleural effusion or pulmonary lesions. The heart is normal in size. No pericardial effusion. The distal esophagus and aorta are unremarkable. Hepatobiliary: No focal  hepatic lesions or intrahepatic biliary dilatation. The gallbladder is normal. No common bile duct dilatation. Pancreas: No mass, inflammation or ductal dilatation. Spleen: Normal size.  No focal lesions. Adrenals/Urinary Tract: The adrenal glands and kidneys are unremarkable. No renal, ureteral or bladder calculi or mass. Stomach/Bowel: The stomach, duodenum, small bowel and terminal ileum are normal. The appendix is surgically absent. There is a moderate to large amount of stool throughout the colon and down into the rectum suggesting constipation. No acute inflammatory process, mass lesions or obstructive findings. Vascular/Lymphatic: The aorta is normal in caliber. No dissection. The branch vessels are patent. The major venous structures are patent. No mesenteric or retroperitoneal mass or adenopathy. Small scattered lymph nodes are noted.  Reproductive: The prostate gland and seminal vesicles are unremarkable. Suspect left-sided scrotal varicocele. Other: No pelvic mass or adenopathy. No free pelvic fluid collections. No inguinal mass or adenopathy. No abdominal wall hernia or subcutaneous lesions. Musculoskeletal: No significant bony findings. IMPRESSION: 1. No acute abdominal/pelvic findings, mass lesions or lymphadenopathy. 2. Moderate to large amount of stool throughout the colon and down into the rectum suggesting constipation. Electronically Signed   By: Marijo Sanes M.D.   On: 02/08/2018 14:14   Dg Abd Acute W/chest  Result Date: 01/29/2018 CLINICAL DATA:  Constipation, unspecified EXAM: DG ABDOMEN ACUTE W/ 1V CHEST COMPARISON:  11/14/2017 abdominal CT FINDINGS: History of constipation with formed stool seen from the cecum to rectum. No rectal distention. No evidence of small bowel obstruction. No concerning mass effect or calcification. On the chest view interstitial markings are somewhat coarsened but this is likely technical based on the other view and previous imaging. Normal heart size and mediastinal contours. IMPRESSION: Formed stool throughout the colon correlating with constipation history. No rectal distention or obstructive change. Electronically Signed   By: Monte Fantasia M.D.   On: 01/29/2018 06:26   Dg Colon W/water Sol Cm  Result Date: 02/10/2018 CLINICAL DATA:  Chronic constipation EXAM: WATER SOLUBLE CONTRAST ENEMA TECHNIQUE: After insertion of an enema tip, Isovue 300 diluted with water was instilled retrograde by gravity drip. Contrast passed to the cecal tip. Multiple images were obtained. Post evacuation image was also obtained. FLUOROSCOPY TIME:  Fluoroscopy Time:  3 minutes 30 seconds Radiation Exposure Index (if provided by the fluoroscopic device): 168.9 mGy Number of Acquired Spot Images: 3 plus multiple fluoroscopic screen captures COMPARISON:  CT abdomen pelvis 02/08/2018 FINDINGS: Scout image demonstrates  mild scattered retained stool in colon. No bowel dilatation. Osseous structures unremarkable. Contrast easily passed from rectum to tip of cecum. Competent ileocecal valve. Appendix surgically absent by history. Scattered retained stool throughout colon. Colon normal in caliber throughout its course. Mild redundancy of the sigmoid colon. No colonic mass or obstruction identified. Unable to exclude polyps by this exam due to the scattered retained stool. Significant retained contrast on post evacuation image. IMPRESSION: Scattered retained colonic stool. No gross evidence of colonic mass or obstruction. Electronically Signed   By: Lavonia Dana M.D.   On: 02/10/2018 15:12    Orson Eva, DO  [facility] Pager [phone]  If 7PM-7AM, please contact night-coverage [email or web address] Password [email or web address] 02/10/2018, 5:05 PM   LOS: 0 days

## 2018-02-10 NOTE — Progress Notes (Signed)
Subjective:  No BM, just Milk and Molasses fluid returned after enema. +flatus. No N/V.   Objective: Vital signs in last 24 hours: Temp:  [97.5 F (36.4 C)-98.5 F (36.9 C)] 97.5 F (36.4 C) (05/23 0515) Pulse Rate:  [48-53] 53 (05/23 0515) BP: (94-102)/(60-61) 94/60 (05/23 0515) SpO2:  [95 %-96 %] 96 % (05/23 0515) Last BM Date: (per pt 42 days ago) General:   Alert,  Well-developed, well-nourished, pleasant and cooperative in NAD Head:  Normocephalic and atraumatic. Eyes:  Sclera clear, no icterus.  Abdomen:  Soft, nontender and nondistended.Normal bowel sounds, without guarding, and without rebound.   Extremities:  Without clubbing, deformity or edema. Neurologic:  Alert and  oriented x4;  grossly normal neurologically. Skin:  Intact without significant lesions or rashes. Psych:  Alert and cooperative. Normal mood and affect.  Intake/Output from previous day: No intake/output data recorded. Intake/Output this shift: No intake/output data recorded.  Lab Results: CBC Recent Labs    02/08/18 1239 02/10/18 0642  WBC 6.5 6.0  HGB 12.8* 13.5  HCT 38.6* 41.5  MCV 93.5 93.9  PLT 154 153   BMET Recent Labs    02/08/18 1239 02/10/18 0642  NA 135 138  K 3.7 4.0  CL 102 102  CO2 27 30  GLUCOSE 92 83  BUN 12 11  CREATININE 0.97 0.93  CALCIUM 8.4* 9.0   LFTs Recent Labs    02/08/18 1239  BILITOT 0.3  ALKPHOS 51  AST 20  ALT 13*  PROT 6.0*  ALBUMIN 3.6   No results for input(s): LIPASE in the last 72 hours. PT/INR No results for input(s): LABPROT, INR in the last 72 hours.    Imaging Studies: Ct Abdomen Pelvis W Contrast  Result Date: 02/08/2018 CLINICAL DATA:  Constipation and nausea and vomiting. EXAM: CT ABDOMEN AND PELVIS WITH CONTRAST TECHNIQUE: Multidetector CT imaging of the abdomen and pelvis was performed using the standard protocol following bolus administration of intravenous contrast. CONTRAST:  135mL ISOVUE-300 IOPAMIDOL (ISOVUE-300) INJECTION  61% COMPARISON:  CT scan 11/14/2017 FINDINGS: Lower chest: The lung bases are clear of acute process. No pleural effusion or pulmonary lesions. The heart is normal in size. No pericardial effusion. The distal esophagus and aorta are unremarkable. Hepatobiliary: No focal hepatic lesions or intrahepatic biliary dilatation. The gallbladder is normal. No common bile duct dilatation. Pancreas: No mass, inflammation or ductal dilatation. Spleen: Normal size.  No focal lesions. Adrenals/Urinary Tract: The adrenal glands and kidneys are unremarkable. No renal, ureteral or bladder calculi or mass. Stomach/Bowel: The stomach, duodenum, small bowel and terminal ileum are normal. The appendix is surgically absent. There is a moderate to large amount of stool throughout the colon and down into the rectum suggesting constipation. No acute inflammatory process, mass lesions or obstructive findings. Vascular/Lymphatic: The aorta is normal in caliber. No dissection. The branch vessels are patent. The major venous structures are patent. No mesenteric or retroperitoneal mass or adenopathy. Small scattered lymph nodes are noted. Reproductive: The prostate gland and seminal vesicles are unremarkable. Suspect left-sided scrotal varicocele. Other: No pelvic mass or adenopathy. No free pelvic fluid collections. No inguinal mass or adenopathy. No abdominal wall hernia or subcutaneous lesions. Musculoskeletal: No significant bony findings. IMPRESSION: 1. No acute abdominal/pelvic findings, mass lesions or lymphadenopathy. 2. Moderate to large amount of stool throughout the colon and down into the rectum suggesting constipation. Electronically Signed   By: Marijo Sanes M.D.   On: 02/08/2018 14:14   Dg Abd Acute W/chest  Result  Date: 01/29/2018 CLINICAL DATA:  Constipation, unspecified EXAM: DG ABDOMEN ACUTE W/ 1V CHEST COMPARISON:  11/14/2017 abdominal CT FINDINGS: History of constipation with formed stool seen from the cecum to rectum.  No rectal distention. No evidence of small bowel obstruction. No concerning mass effect or calcification. On the chest view interstitial markings are somewhat coarsened but this is likely technical based on the other view and previous imaging. Normal heart size and mediastinal contours. IMPRESSION: Formed stool throughout the colon correlating with constipation history. No rectal distention or obstructive change. Electronically Signed   By: Monte Fantasia M.D.   On: 01/29/2018 06:26  [2 weeks]   Assessment:   37 year old male presenting with obstipation, failing outpatient measures to include bowel prep, Amitiza 24 mcg BID, stool softeners, and Miralax. CT today without signs of obstruction and notable stool burden. Clinically, he does not appear in any distress.  Chronically on suboxone and recently had additional oxycodone s/p hydrocelectomy April 2019. Recent attempt at colonoscopy unsuccessful due to formed stool in rectum and sigmoid colon. TSH on file recently and normal.   Constipation/obstipation complicated by chronic opioid use and dysmotility. Failing Amitiza and multiple OTC agents. Has received Movantik for two days without BM. Miralax and dulcolax yesterday as well without results.    Weight loss is notable and concerning.   Plan: 1. Await surgical consultation.   Laureen Ochs. Bernarda Caffey [facility] [phone] 5/23/20198:32 AM     LOS: 0 days
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
## 2018-02-10 NOTE — Progress Notes (Signed)
[facility]  Full note to follow. Starting with water soluble enema. Radiology aware. Therapeutic/ diagnostic to look for signs of any stricture/ mass, etc.   Curlene Labrum, MD [facility] [address] [place],  [address] [phone] (office)

## 2018-02-11 DIAGNOSIS — R569 Unspecified convulsions: Secondary | ICD-10-CM | POA: Diagnosis not present

## 2018-02-11 DIAGNOSIS — K5903 Drug induced constipation: Secondary | ICD-10-CM | POA: Diagnosis not present

## 2018-02-11 DIAGNOSIS — F11288 Opioid dependence with other opioid-induced disorder: Secondary | ICD-10-CM

## 2018-02-11 DIAGNOSIS — F319 Bipolar disorder, unspecified: Secondary | ICD-10-CM | POA: Diagnosis present

## 2018-02-11 DIAGNOSIS — I10 Essential (primary) hypertension: Secondary | ICD-10-CM | POA: Diagnosis present

## 2018-02-11 DIAGNOSIS — T402X5A Adverse effect of other opioids, initial encounter: Secondary | ICD-10-CM | POA: Diagnosis present

## 2018-02-11 DIAGNOSIS — K5901 Slow transit constipation: Secondary | ICD-10-CM | POA: Diagnosis not present

## 2018-02-11 DIAGNOSIS — K219 Gastro-esophageal reflux disease without esophagitis: Secondary | ICD-10-CM | POA: Diagnosis present

## 2018-02-11 DIAGNOSIS — F39 Unspecified mood [affective] disorder: Secondary | ICD-10-CM | POA: Diagnosis not present

## 2018-02-11 DIAGNOSIS — F1120 Opioid dependence, uncomplicated: Secondary | ICD-10-CM | POA: Diagnosis present

## 2018-02-11 DIAGNOSIS — Z5309 Procedure and treatment not carried out because of other contraindication: Secondary | ICD-10-CM | POA: Diagnosis present

## 2018-02-11 DIAGNOSIS — Z823 Family history of stroke: Secondary | ICD-10-CM | POA: Diagnosis not present

## 2018-02-11 DIAGNOSIS — F17210 Nicotine dependence, cigarettes, uncomplicated: Secondary | ICD-10-CM | POA: Diagnosis present

## 2018-02-11 DIAGNOSIS — G4730 Sleep apnea, unspecified: Secondary | ICD-10-CM | POA: Diagnosis present

## 2018-02-11 DIAGNOSIS — D696 Thrombocytopenia, unspecified: Secondary | ICD-10-CM | POA: Diagnosis present

## 2018-02-11 DIAGNOSIS — Z833 Family history of diabetes mellitus: Secondary | ICD-10-CM | POA: Diagnosis not present

## 2018-02-11 DIAGNOSIS — K5904 Chronic idiopathic constipation: Secondary | ICD-10-CM | POA: Diagnosis not present

## 2018-02-11 DIAGNOSIS — R634 Abnormal weight loss: Secondary | ICD-10-CM | POA: Diagnosis present

## 2018-02-11 DIAGNOSIS — I2510 Atherosclerotic heart disease of native coronary artery without angina pectoris: Secondary | ICD-10-CM | POA: Diagnosis present

## 2018-02-11 DIAGNOSIS — K5900 Constipation, unspecified: Secondary | ICD-10-CM

## 2018-02-11 DIAGNOSIS — G40909 Epilepsy, unspecified, not intractable, without status epilepticus: Secondary | ICD-10-CM | POA: Diagnosis present

## 2018-02-11 DIAGNOSIS — F1129 Opioid dependence with unspecified opioid-induced disorder: Secondary | ICD-10-CM

## 2018-02-11 DIAGNOSIS — Z79899 Other long term (current) drug therapy: Secondary | ICD-10-CM | POA: Diagnosis not present

## 2018-02-11 DIAGNOSIS — Z720 Tobacco use: Secondary | ICD-10-CM | POA: Diagnosis not present

## 2018-02-11 DIAGNOSIS — Y92009 Unspecified place in unspecified non-institutional (private) residence as the place of occurrence of the external cause: Secondary | ICD-10-CM | POA: Diagnosis not present

## 2018-02-11 DIAGNOSIS — F419 Anxiety disorder, unspecified: Secondary | ICD-10-CM | POA: Diagnosis present

## 2018-02-11 DIAGNOSIS — E785 Hyperlipidemia, unspecified: Secondary | ICD-10-CM | POA: Diagnosis present

## 2018-02-11 DIAGNOSIS — Z885 Allergy status to narcotic agent status: Secondary | ICD-10-CM | POA: Diagnosis not present

## 2018-02-11 DIAGNOSIS — Z8249 Family history of ischemic heart disease and other diseases of the circulatory system: Secondary | ICD-10-CM | POA: Diagnosis not present

## 2018-02-11 DIAGNOSIS — Z800 Family history of malignant neoplasm of digestive organs: Secondary | ICD-10-CM | POA: Diagnosis not present

## 2018-02-11 DIAGNOSIS — Z6828 Body mass index (BMI) 28.0-28.9, adult: Secondary | ICD-10-CM | POA: Diagnosis not present

## 2018-02-11 DIAGNOSIS — F603 Borderline personality disorder: Secondary | ICD-10-CM | POA: Diagnosis present

## 2018-02-11 DIAGNOSIS — Z87438 Personal history of other diseases of male genital organs: Secondary | ICD-10-CM | POA: Diagnosis not present

## 2018-02-11 LAB — PREALBUMIN: Prealbumin: 28.4 mg/dL (ref 18–38)

## 2018-02-11 LAB — HIV ANTIBODY (ROUTINE TESTING W REFLEX): HIV Screen 4th Generation wRfx: NONREACTIVE

## 2018-02-11 MED ORDER — METRONIDAZOLE 500 MG PO TABS
1000.0000 mg | ORAL_TABLET | Freq: Once | ORAL | Status: AC
  Administered 2018-02-14: 1000 mg via ORAL
  Filled 2018-02-11: qty 2

## 2018-02-11 MED ORDER — NEOMYCIN SULFATE 500 MG PO TABS
1000.0000 mg | ORAL_TABLET | Freq: Once | ORAL | Status: AC
  Administered 2018-02-14: 1000 mg via ORAL
  Filled 2018-02-11: qty 2

## 2018-02-11 MED ORDER — PANTOPRAZOLE SODIUM 40 MG PO TBEC
40.0000 mg | DELAYED_RELEASE_TABLET | Freq: Every day | ORAL | Status: DC
  Administered 2018-02-11 – 2018-02-14 (×4): 40 mg via ORAL
  Filled 2018-02-11 (×4): qty 1

## 2018-02-11 MED ORDER — ENSURE ENLIVE PO LIQD
237.0000 mL | Freq: Three times a day (TID) | ORAL | Status: DC
  Administered 2018-02-11: 237 mL via ORAL

## 2018-02-11 MED ORDER — FLEET ENEMA 7-19 GM/118ML RE ENEM
1.0000 | ENEMA | Freq: Once | RECTAL | Status: AC
  Administered 2018-02-14: 1 via RECTAL

## 2018-02-11 MED ORDER — POLYETHYLENE GLYCOL 3350 17 GM/SCOOP PO POWD
1.0000 | Freq: Once | ORAL | Status: DC
  Filled 2018-02-11: qty 255

## 2018-02-11 MED ORDER — FLEET ENEMA 7-19 GM/118ML RE ENEM
1.0000 | ENEMA | Freq: Once | RECTAL | Status: AC
  Administered 2018-02-15: 1 via RECTAL

## 2018-02-11 MED ORDER — POLYETHYLENE GLYCOL 3350 17 G PO PACK
255.0000 g | PACK | Freq: Once | ORAL | Status: AC
  Administered 2018-02-14: 255 g via ORAL
  Filled 2018-02-11: qty 15
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
## 2018-02-11 NOTE — Progress Notes (Signed)
[facility]  Spoke with Dr. Florentina Jenny, MD in [place]. She is the pain management physician that the patient is in a pain contract with currently.  Discussed with her the options for pain management after surgery. Explained that I do not want him to break his contract with her or risk causing issues with his management.  She expressed understanding but is limited due to only practicing as outpatient.   She recommends using buprenorphone 8 BID (his normal dose of suboxone) and buprenorphine 8 BID PRN for breakthrough pain.  He can go home on this regimen and then would see her for titration of this down.  Have discussed with pharmacy and we do have buprenorphone and this is what the patient is on currently.  Also discussed with Dr. Carles Collet regarding options for discharge Rx?   He is going to look into the prescribing of buprenorphone for a few days post op.  Patient could see Dr. Valetta Close on Monday June 3 if out of the hospital.  If he takes other opioid meds, he would be breaking her contract and would not be allow to follow with her any longer.   Would plan to do multimodality pain meds including NSAIDs, Xparel/ Sensicaine, Tylenol, and Gabapentin so that we could minimize other pain med use while in hospital.  Will try to sort out prior to surgery Tuesday. Appreciate Dr. Arturo Morton assistance.    Curlene Labrum, MD [facility] [address] [place], [place] [address] [phone] (office)
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
## 2018-02-11 NOTE — Progress Notes (Signed)
PROGRESS NOTE  Jermaine [name] MPN:[id number] DOB: [birth year] DOA: 02/08/2018 PCP: [name], MD  Brief History: 37 year old male with a history of opioid dependence, bipolar disorder, seizure like activity, hypertension, borderline personality disorder presenting with constipation without a bowel movement forover40 days. The patient has been on chronic buprenorphine which he has been getting from Dr. Valetta Close. He denies any other new opioids except for the oxycodone he received after his surgical procedure on 01/14/2018 when he underwent a left hydrocelectomy.He states that he drank 1 preparation of GoLYTELY on 02/07/2018 without a bowel movement. He states that he was taking Amitiza along with MiraLAX and Senokot at home. He complains of intermittent episodes of vomiting lasting 20 to 30 minutes. ; he does state he had a small, thin stool about width of pinky around 5/18. Denies any abdominal pain unless abdomen is palpated.02/08/2018 CT abdomen and pelvis was essentially negative for acute findings except for moderate to large amount of stool throughout the colon without inflammatory process. GI was consulted to assist with management.  Despite numerous enemas and oral medications, the patient has not had a bowel movement.  General surgery was consulted.  Subtotal colectomy is planned for 01/20/18.   Assessment/Plan: Constipation -Secondary to chronic opioid dependence -Appreciate GI follow-up -Continue bowel preparation including milk of molasses enema, Movantik, Amitiza -Continue Dulcolax and MiraLAX every hour x5 hours -still no BM-->general surgery consulted -water soluable enema-->made it to cecum, but no BM -appreciate general surgery-->subtotal colectomy 01/20/18  Chronic opioid dependence -The [place] Controlled Substance Reporting System has been queried for this patient -The patient last filled Percocet 5/325, #30 on 01/28/2018 -The patient last filled  buprenorphine 8/2 #22 on 01/24/2018  Thrombocytopenia -chronic -B12--524 -TSH--1.240 -HIV--neg -likely medication related -am CBC  Seizure like activity -follows Dr. Krista Blue -continue keppra, depakote  Hypertension -Continue clonidine and metoprolol tartrate  Bipolar disorder/borderline personality disorder -Continue Depakote, lithium, Seroquel, trazodone,Lamictal  Tobacco abuse -I have discussed tobacco cessation with the patient. I have counseled the patient regarding the negative impacts of continued tobacco use including but not limited to lung cancer, COPD, and cardiovascular disease. I have discussed alternatives to tobacco and modalities that may help facilitate tobacco cessation including but not limited to biofeedback, hypnosis, and medications. Total time spent with tobacco counseling was 4 minutes.     Disposition Plan: Home next week after subtotal colectomy Family Communication:NoFamily at bedside   Consultants:GI, general surgery  Code Status: FULL  DVT Prophylaxis: SCDs   Procedures: As Listed in Progress Note Above  Antibiotics: None     Subjective: Patient still has not had a bowel movement.  States that his abdominal discomforts about the same.  He denies any worsening abdominal distention.  Denies any nausea, vomiting, chest pain, shortness breath, fevers, chills.  Objective: Vitals:   02/11/18 0527 02/11/18 0637 02/11/18 0650 02/11/18 1459  BP: (!) 87/48 (!) 85/49 102/70 124/68  Pulse: (!) 52 (!) 52  (!) 58  Resp:    18  Temp: 97.8 F (36.6 C)   98.5 F (36.9 C)  TempSrc: Oral   Oral  SpO2: 90%   94%  Weight:      Height:        Intake/Output Summary (Last 24 hours) at 02/11/2018 1650 Last data filed at 02/11/2018 1000 Gross per 24 hour  Intake 840 ml  Output 1 ml  Net 839 ml   Weight change:  Exam:   General:  Pt is alert, follows commands appropriately, not in acute distress  HEENT: No icterus, No  thrush, No neck mass, [place]/AT  Cardiovascular: RRR, S1/S2, no rubs, no gallops  Respiratory: CTA bilaterally, no wheezing, no crackles, no rhonchi  Abdomen: Soft/+BS, generalized tender, non distended, no guarding  Extremities: No edema, No lymphangitis, No petechiae, No rashes, no synovitis   Data Reviewed: I have personally reviewed following labs and imaging studies Basic Metabolic Panel: Recent Labs  Lab 02/08/18 1239 02/10/18 0642  NA 135 138  K 3.7 4.0  CL 102 102  CO2 27 30  GLUCOSE 92 83  BUN 12 11  CREATININE 0.97 0.93  CALCIUM 8.4* 9.0  MG  --  2.2   Liver Function Tests: Recent Labs  Lab 02/08/18 1239  AST 20  ALT 13*  ALKPHOS 51  BILITOT 0.3  PROT 6.0*  ALBUMIN 3.6   No results for input(s): LIPASE, AMYLASE in the last 168 hours. No results for input(s): AMMONIA in the last 168 hours. Coagulation Profile: No results for input(s): INR, PROTIME in the last 168 hours. CBC: Recent Labs  Lab 02/08/18 1239 02/10/18 0642  WBC 6.5 6.0  NEUTROABS 3.5  --   HGB 12.8* 13.5  HCT 38.6* 41.5  MCV 93.5 93.9  PLT 154 153   Cardiac Enzymes: No results for input(s): CKTOTAL, CKMB, CKMBINDEX, TROPONINI in the last 168 hours. BNP: Invalid input(s): POCBNP CBG: No results for input(s): GLUCAP in the last 168 hours. HbA1C: No results for input(s): HGBA1C in the last 72 hours. Urine analysis:    Component Value Date/Time   COLORURINE STRAW (A) 11/14/2017 2108   APPEARANCEUR CLEAR 11/14/2017 2108   LABSPEC 1.008 11/14/2017 2108   PHURINE 8.0 11/14/2017 2108   GLUCOSEU NEGATIVE 11/14/2017 2108   HGBUR NEGATIVE 11/14/2017 2108   BILIRUBINUR NEGATIVE 11/14/2017 2108   KETONESUR 20 (A) 11/14/2017 2108   PROTEINUR NEGATIVE 11/14/2017 2108   UROBILINOGEN 0.2 12/18/2009 1312   NITRITE NEGATIVE 11/14/2017 2108   LEUKOCYTESUR NEGATIVE 11/14/2017 2108   Sepsis Labs: @LABRCNTIP (procalcitonin:4,lacticidven:4) )No results found for this or any previous visit  (from the past 240 hour(s)).   Scheduled Meds: . buprenorphine  8 mg Sublingual BID  . cloNIDine  0.1 mg Oral BID  . divalproex  500 mg Oral Q8H  . docusate sodium  100 mg Oral Daily  . feeding supplement (ENSURE ENLIVE)  237 mL Oral TID BM  . lamoTRIgine  100 mg Oral BID  . levETIRAcetam  1,500 mg Oral BID  . lithium carbonate  300 mg Oral TID WC  . metoprolol tartrate  50 mg Oral BID  . [START ON 02/14/2018] metroNIDAZOLE  1,000 mg Oral Once   Followed by  . [START ON 02/14/2018] metroNIDAZOLE  1,000 mg Oral Once   Followed by  . [START ON 02/14/2018] metroNIDAZOLE  1,000 mg Oral Once  . naloxegol oxalate  25 mg Oral Daily  . [START ON 02/14/2018] neomycin  1,000 mg Oral Once   Followed by  . [START ON 02/14/2018] neomycin  1,000 mg Oral Once   Followed by  . [START ON 02/14/2018] neomycin  1,000 mg Oral Once  . nicotine  14 mg Transdermal Daily  . pantoprazole  40 mg Oral QAC breakfast  . [START ON 02/14/2018] polyethylene glycol  255 g Oral Once  . QUEtiapine  200 mg Oral QHS  . simvastatin  10 mg Oral q1800  . sodium chloride flush  3 mL Intravenous Q12H  . [START ON  02/14/2018] sodium phosphate  1 enema Rectal Once  . [START ON 02/15/2018] sodium phosphate  1 enema Rectal Once  . trazodone  300 mg Oral QHS   Continuous Infusions: . sodium chloride      Procedures/Studies: Ct Abdomen Pelvis W Contrast  Result Date: 02/08/2018 CLINICAL DATA:  Constipation and nausea and vomiting. EXAM: CT ABDOMEN AND PELVIS WITH CONTRAST TECHNIQUE: Multidetector CT imaging of the abdomen and pelvis was performed using the standard protocol following bolus administration of intravenous contrast. CONTRAST:  126mL ISOVUE-300 IOPAMIDOL (ISOVUE-300) INJECTION 61% COMPARISON:  CT scan 11/14/2017 FINDINGS: Lower chest: The lung bases are clear of acute process. No pleural effusion or pulmonary lesions. The heart is normal in size. No pericardial effusion. The distal esophagus and aorta are unremarkable.  Hepatobiliary: No focal hepatic lesions or intrahepatic biliary dilatation. The gallbladder is normal. No common bile duct dilatation. Pancreas: No mass, inflammation or ductal dilatation. Spleen: Normal size.  No focal lesions. Adrenals/Urinary Tract: The adrenal glands and kidneys are unremarkable. No renal, ureteral or bladder calculi or mass. Stomach/Bowel: The stomach, duodenum, small bowel and terminal ileum are normal. The appendix is surgically absent. There is a moderate to large amount of stool throughout the colon and down into the rectum suggesting constipation. No acute inflammatory process, mass lesions or obstructive findings. Vascular/Lymphatic: The aorta is normal in caliber. No dissection. The branch vessels are patent. The major venous structures are patent. No mesenteric or retroperitoneal mass or adenopathy. Small scattered lymph nodes are noted. Reproductive: The prostate gland and seminal vesicles are unremarkable. Suspect left-sided scrotal varicocele. Other: No pelvic mass or adenopathy. No free pelvic fluid collections. No inguinal mass or adenopathy. No abdominal wall hernia or subcutaneous lesions. Musculoskeletal: No significant bony findings. IMPRESSION: 1. No acute abdominal/pelvic findings, mass lesions or lymphadenopathy. 2. Moderate to large amount of stool throughout the colon and down into the rectum suggesting constipation. Electronically Signed   By: Marijo Sanes M.D.   On: 02/08/2018 14:14   Dg Abd Acute W/chest  Result Date: 01/29/2018 CLINICAL DATA:  Constipation, unspecified EXAM: DG ABDOMEN ACUTE W/ 1V CHEST COMPARISON:  11/14/2017 abdominal CT FINDINGS: History of constipation with formed stool seen from the cecum to rectum. No rectal distention. No evidence of small bowel obstruction. No concerning mass effect or calcification. On the chest view interstitial markings are somewhat coarsened but this is likely technical based on the other view and previous imaging.  Normal heart size and mediastinal contours. IMPRESSION: Formed stool throughout the colon correlating with constipation history. No rectal distention or obstructive change. Electronically Signed   By: Monte Fantasia M.D.   On: 01/29/2018 06:26   Dg Colon W/water Sol Cm  Result Date: 02/10/2018 CLINICAL DATA:  Chronic constipation EXAM: WATER SOLUBLE CONTRAST ENEMA TECHNIQUE: After insertion of an enema tip, Isovue 300 diluted with water was instilled retrograde by gravity drip. Contrast passed to the cecal tip. Multiple images were obtained. Post evacuation image was also obtained. FLUOROSCOPY TIME:  Fluoroscopy Time:  3 minutes 30 seconds Radiation Exposure Index (if provided by the fluoroscopic device): 168.9 mGy Number of Acquired Spot Images: 3 plus multiple fluoroscopic screen captures COMPARISON:  CT abdomen pelvis 02/08/2018 FINDINGS: Scout image demonstrates mild scattered retained stool in colon. No bowel dilatation. Osseous structures unremarkable. Contrast easily passed from rectum to tip of cecum. Competent ileocecal valve. Appendix surgically absent by history. Scattered retained stool throughout colon. Colon normal in caliber throughout its course. Mild redundancy of the sigmoid colon. No  colonic mass or obstruction identified. Unable to exclude polyps by this exam due to the scattered retained stool. Significant retained contrast on post evacuation image. IMPRESSION: Scattered retained colonic stool. No gross evidence of colonic mass or obstruction. Electronically Signed   By: Lavonia Dana M.D.   On: 02/10/2018 15:12    Orson Eva, DO  [facility] Pager [phone]  If 7PM-7AM, please contact night-coverage [email or web address] Password [email or web address] 02/11/2018, 4:50 PM   LOS: 0 days

## 2018-02-11 NOTE — Progress Notes (Signed)
[facility] Progress Note     Subjective: NO major issues. Enema completed and have limited stool in colon and made to the cecum. Had some liquid evacuated.   Objective: Vital signs in last 24 hours: Temp:  [97.8 F (36.6 C)-98.7 F (37.1 C)] 97.8 F (36.6 C) (05/24 0527) Pulse Rate:  [52-127] 52 (05/24 0637) Resp:  [24] 24 (05/23 2135) BP: (85-120)/(48-91) 102/70 (05/24 0650) SpO2:  [90 %-100 %] 90 % (05/24 0527) Last BM Date: 02/10/18  Intake/Output from previous day: 05/23 0701 - 05/24 0700 In: 720 [P.O.:720] Out: 2 [Urine:1; Stool:1] Intake/Output this shift: No intake/output data recorded.  General appearance: alert, cooperative and no distress Resp: normal work breathing GI: soft, minimally tender, no rebound or guarding  Lab Results:  Recent Labs    02/08/18 1239 02/10/18 0642  WBC 6.5 6.0  HGB 12.8* 13.5  HCT 38.6* 41.5  PLT 154 153   BMET Recent Labs    02/08/18 1239 02/10/18 0642  NA 135 138  K 3.7 4.0  CL 102 102  CO2 27 30  GLUCOSE 92 83  BUN 12 11  CREATININE 0.97 0.93  CALCIUM 8.4* 9.0    Studies/Results: Dg Colon W/water Sol Cm  Result Date: 02/10/2018 CLINICAL DATA:  Chronic constipation EXAM: WATER SOLUBLE CONTRAST ENEMA TECHNIQUE: After insertion of an enema tip, Isovue 300 diluted with water was instilled retrograde by gravity drip. Contrast passed to the cecal tip. Multiple images were obtained. Post evacuation image was also obtained. FLUOROSCOPY TIME:  Fluoroscopy Time:  3 minutes 30 seconds Radiation Exposure Index (if provided by the fluoroscopic device): 168.9 mGy Number of Acquired Spot Images: 3 plus multiple fluoroscopic screen captures COMPARISON:  CT abdomen pelvis 02/08/2018 FINDINGS: Scout image demonstrates mild scattered retained stool in colon. No bowel dilatation. Osseous structures unremarkable. Contrast easily passed from rectum to tip of cecum. Competent ileocecal valve. Appendix surgically absent by  history. Scattered retained stool throughout colon. Colon normal in caliber throughout its course. Mild redundancy of the sigmoid colon. No colonic mass or obstruction identified. Unable to exclude polyps by this exam due to the scattered retained stool. Significant retained contrast on post evacuation image. IMPRESSION: Scattered retained colonic stool. No gross evidence of colonic mass or obstruction. Electronically Signed   By: Lavonia Dana M.D.   On: 02/10/2018 15:12    Assessment/Plan: Patient with severe constipation and no signs of mass/ stricture. Limited defecation after enema but did have some liquid pass. He has a very redundant colon. We have discussed that the colon is long and redundant and likely he has some degree of colonic inertia. Discussed that there are other studies like Sitz markers and defecography that can be ordered but would be outpatient and probably take a few weeks/ months to get.   He is very tired of testing and wants to pursue the subtotal colectomy.  We have discussed that his may not resolve the problems, and that he could have slow transit in the small bowel, and that he could have issues with defecation, all of which we would find out with the other studies.  At this point, he would rather take the risk and have some potential relief and get the surgery.  I have discussed the case with Dr. Arnoldo Morale and Dr. Oneida Alar and both agree that clinical symptoms warrant the surgery.   -Will do full liquid diet for now so we do not get more formed stool, clears on Monday and NPO at midnight Monday -  Prealbumin ordered, Ensure TID for nutrition, Dietion help appreciated given weight loss   Monday Prior to Surgery:   -Miralax and 64 oz of Gatorade ordered at 10AM -Enema on Monday PM and Tuesday AM  -Neomycin 1000mg  and Flagyl 1000mg  at 2pm, 3pm, and 10 pm ordered  -On Movantik Already daily   Discussed the risk and benefits of surgery / subtotal colectomy including but not  limited to bleeding, infection, risk of ureter injury, risk of leak, risk of needing further surgery, risk of not having relief from the constipation/ slow transit, risk of diarrhea, risk of continue to have the nausea/vomiting and tenderness on exam, risk of hernia.   He has opted to proceed.  Will need to discuss with his Pain Doctor, Dr. Valetta Close in [place] regarding post op medications. [phone].  Remaining on the hospitalist service given his medical issues.    LOS: 0 days    Virl Cagey 02/11/2018

## 2018-02-12 DIAGNOSIS — I10 Essential (primary) hypertension: Secondary | ICD-10-CM

## 2018-02-12 DIAGNOSIS — K5904 Chronic idiopathic constipation: Secondary | ICD-10-CM

## 2018-02-12 LAB — SURGICAL PCR SCREEN
MRSA, PCR: NEGATIVE
Staphylococcus aureus: NEGATIVE

## 2018-02-12 NOTE — Progress Notes (Signed)
PROGRESS NOTE    Jermaine [name]  SWF:[id number] DOB: [birth year] DOA: 02/08/2018 PCP: [name], MD   Brief Narrative:  37 year old with a history of bipolar disorder, opioid dependence, seizures, essential hypertension, borderline personality disorder came to the hospital with complains of constipation for over 40 days.  He has been using chronic buprenorphine, following up with Dr. Valetta Close outpatient.  Denies currently using any outpatient narcotics.  Previously he has tried multiple different type of preparation and laxatives such as GoLYTELY, senna, MiraLAX, and Amitiza without any relief.  States his stools are very thin whenever he had a previously.  CT of the abdomen pelvis done on 5/21 showed moderate to large stool burden throughout the colon without any inflammatory process.  General surgery was consulted who plans on performing partial colectomy on 5/28.   Assessment & Plan:   Principal Problem:   Constipation Active Problems:   Mood disorder (HCC)   GERD (gastroesophageal reflux disease)   Opioid dependence (HCC)   Thrombocytopenia (HCC)   Tobacco abuse  Severe constipation secondary to chronic opioid use - Failed multiple bowel preparations previously.  General surgery following who plans on performing partial colectomy on 5/28.  Continue supportive care in the meantime.  Chronic opioid dependence - Apparently was given Percocet earlier in the month due to his abdominal pain and now has been on buprenorphine, follows Dr Valetta Close. Per documentation Dr Constance Haw had discussed the case with Dr Valetta Close in regards to discharge Buprenorphine recs at the time of discharge and the follow up.   History of seizures -Continue Keppra and Depakote.  Patient is also on lamotrigine 100 mg twice daily  Essential hypertension - Continue home regimen-currently on clonidine 0.1 mg orally twice daily and metoprolol 50 mg twice daily  Bipolar disorder -Continue lithium  Tobacco use -Counseled  to quit using tobacco  Hyperlipidemia -Continue statin  DVT prophylaxis: SCDs Code Status: Full code Family Communication: None at bedside Disposition Plan: To be determined  Consultants:   General surgery  Procedures:   No  Antimicrobials:   None   Subjective: Still no bowel movement but no other new complaints.  Review of Systems Otherwise negative except as per HPI, including: General: Denies fever, chills, night sweats or unintended weight loss. Resp: Denies cough, wheezing, shortness of breath. Cardiac: Denies chest pain, palpitations, orthopnea, paroxysmal nocturnal dyspnea. GI: Denies abdominal pain, nausea, vomiting, diarrhea  GU: Denies dysuria, frequency, hesitancy or incontinence MS: Denies muscle aches, joint pain or swelling Neuro: Denies headache, neurologic deficits (focal weakness, numbness, tingling), abnormal gait Psych: Denies anxiety, depression, SI/HI/AVH Skin: Denies new rashes or lesions ID: Denies sick contacts, exotic exposures, travel  Objective: Vitals:   02/11/18 1459 02/11/18 2147 02/12/18 0503 02/12/18 0856  BP: 124/68 (!) 158/139 108/81 101/76  Pulse: (!) 58 64 71   Resp: 18  16   Temp: 98.5 F (36.9 C) 98.1 F (36.7 C) 98 F (36.7 C)   TempSrc: Oral Oral Oral   SpO2: 94% 100% 94%   Weight:      Height:        Intake/Output Summary (Last 24 hours) at 02/12/2018 1130 Last data filed at 02/12/2018 0918 Gross per 24 hour  Intake 363 ml  Output -  Net 363 ml   Filed Weights   02/08/18 1130 02/08/18 1727  Weight: 85.3 kg (188 lb) 94.5 kg (208 lb 6.4 oz)    Examination:  General exam: Appears calm and comfortable  Respiratory system: Clear to auscultation. Respiratory  effort normal. Cardiovascular system: S1 & S2 heard, RRR. No JVD, murmurs, rubs, gallops or clicks. No pedal edema. Gastrointestinal system: Abdomen is nondistended, soft and nontender. No organomegaly or masses felt. Normal bowel sounds heard. Central  nervous system: Alert and oriented. No focal neurological deficits. Extremities: Symmetric 5 x 5 power. Skin: No rashes, lesions or ulcers Psychiatry: Judgement and insight appear normal. Mood & affect appropriate.     Data Reviewed:   CBC: Recent Labs  Lab 02/08/18 1239 02/10/18 0642  WBC 6.5 6.0  NEUTROABS 3.5  --   HGB 12.8* 13.5  HCT 38.6* 41.5  MCV 93.5 93.9  PLT 154 599   Basic Metabolic Panel: Recent Labs  Lab 02/08/18 1239 02/10/18 0642  NA 135 138  K 3.7 4.0  CL 102 102  CO2 27 30  GLUCOSE 92 83  BUN 12 11  CREATININE 0.97 0.93  CALCIUM 8.4* 9.0  MG  --  2.2   GFR: Estimated Creatinine Clearance: 131.1 mL/min (by C-G formula based on SCr of 0.93 mg/dL). Liver Function Tests: Recent Labs  Lab 02/08/18 1239  AST 20  ALT 13*  ALKPHOS 51  BILITOT 0.3  PROT 6.0*  ALBUMIN 3.6   No results for input(s): LIPASE, AMYLASE in the last 168 hours. No results for input(s): AMMONIA in the last 168 hours. Coagulation Profile: No results for input(s): INR, PROTIME in the last 168 hours. Cardiac Enzymes: No results for input(s): CKTOTAL, CKMB, CKMBINDEX, TROPONINI in the last 168 hours. BNP (last 3 results) No results for input(s): PROBNP in the last 8760 hours. HbA1C: No results for input(s): HGBA1C in the last 72 hours. CBG: No results for input(s): GLUCAP in the last 168 hours. Lipid Profile: No results for input(s): CHOL, HDL, LDLCALC, TRIG, CHOLHDL, LDLDIRECT in the last 72 hours. Thyroid Function Tests: Recent Labs    02/10/18 0642  TSH 1.240   Anemia Panel: Recent Labs    02/10/18 0642  VITAMINB12 524   Sepsis Labs: No results for input(s): PROCALCITON, LATICACIDVEN in the last 168 hours.  Recent Results (from the past 240 hour(s))  Surgical PCR screen     Status: None   Collection Time: 02/12/18 12:42 AM  Result Value Ref Range Status   MRSA, PCR NEGATIVE NEGATIVE Final   Staphylococcus aureus NEGATIVE NEGATIVE Final    Comment:  (NOTE) The Xpert SA Assay (FDA approved for NASAL specimens in patients 59 years of age and older), is one component of a comprehensive surveillance program. It is not intended to diagnose infection nor to guide or monitor treatment. Performed at [facility], [address], [place], [place] [address]          Radiology Studies: Dg Colon W/water Sol Cm  Result Date: 02/10/2018 CLINICAL DATA:  Chronic constipation EXAM: WATER SOLUBLE CONTRAST ENEMA TECHNIQUE: After insertion of an enema tip, Isovue 300 diluted with water was instilled retrograde by gravity drip. Contrast passed to the cecal tip. Multiple images were obtained. Post evacuation image was also obtained. FLUOROSCOPY TIME:  Fluoroscopy Time:  3 minutes 30 seconds Radiation Exposure Index (if provided by the fluoroscopic device): 168.9 mGy Number of Acquired Spot Images: 3 plus multiple fluoroscopic screen captures COMPARISON:  CT abdomen pelvis 02/08/2018 FINDINGS: Scout image demonstrates mild scattered retained stool in colon. No bowel dilatation. Osseous structures unremarkable. Contrast easily passed from rectum to tip of cecum. Competent ileocecal valve. Appendix surgically absent by history. Scattered retained stool throughout colon. Colon normal in caliber throughout its course.  Mild redundancy of the sigmoid colon. No colonic mass or obstruction identified. Unable to exclude polyps by this exam due to the scattered retained stool. Significant retained contrast on post evacuation image. IMPRESSION: Scattered retained colonic stool. No gross evidence of colonic mass or obstruction. Electronically Signed   By: Lavonia Dana M.D.   On: 02/10/2018 15:12        Scheduled Meds: . buprenorphine  8 mg Sublingual BID  . cloNIDine  0.1 mg Oral BID  . divalproex  500 mg Oral Q8H  . docusate sodium  100 mg Oral Daily  . feeding supplement (ENSURE ENLIVE)  237 mL Oral TID BM  . lamoTRIgine  100 mg Oral BID  . levETIRAcetam  1,500 mg  Oral BID  . lithium carbonate  300 mg Oral TID WC  . metoprolol tartrate  50 mg Oral BID  . [START ON 02/14/2018] metroNIDAZOLE  1,000 mg Oral Once   Followed by  . [START ON 02/14/2018] metroNIDAZOLE  1,000 mg Oral Once   Followed by  . [START ON 02/14/2018] metroNIDAZOLE  1,000 mg Oral Once  . naloxegol oxalate  25 mg Oral Daily  . [START ON 02/14/2018] neomycin  1,000 mg Oral Once   Followed by  . [START ON 02/14/2018] neomycin  1,000 mg Oral Once   Followed by  . [START ON 02/14/2018] neomycin  1,000 mg Oral Once  . nicotine  14 mg Transdermal Daily  . pantoprazole  40 mg Oral QAC breakfast  . [START ON 02/14/2018] polyethylene glycol  255 g Oral Once  . QUEtiapine  200 mg Oral QHS  . simvastatin  10 mg Oral q1800  . sodium chloride flush  3 mL Intravenous Q12H  . [START ON 02/14/2018] sodium phosphate  1 enema Rectal Once  . [START ON 02/15/2018] sodium phosphate  1 enema Rectal Once  . trazodone  300 mg Oral QHS   Continuous Infusions: . sodium chloride       LOS: 1 day    I have spent 20 minutes face to face with the patient and on the ward discussing the patients care, assessment, plan and disposition with other care givers. >50% of the time was devoted counseling the patient about the risks and benefits of treatment and coordinating care.     Ankit Arsenio Loader, MD [facility] Pager [phone]   If 7PM-7AM, please contact night-coverage [email or web address] Password [email or web address] 02/12/2018, 11:30 AM

## 2018-02-12 NOTE — Progress Notes (Signed)
  Subjective: Patient states he has had some small watery bowel movements.  No significant change since yesterday.  Objective: Vital signs in last 24 hours: Temp:  [98 F (36.7 C)-98.5 F (36.9 C)] 98 F (36.7 C) (05/25 0503) Pulse Rate:  [58-71] 71 (05/25 0503) Resp:  [16-18] 16 (05/25 0503) BP: (108-158)/(68-139) 108/81 (05/25 0503) SpO2:  [94 %-100 %] 94 % (05/25 0503) Last BM Date: 02/10/18  Intake/Output from previous day: 05/24 0701 - 05/25 0700 In: 960 [P.O.:960] Out: -  Intake/Output this shift: No intake/output data recorded.  General appearance: alert, cooperative and no distress GI: soft, non-tender; bowel sounds normal; no masses,  no organomegaly  Lab Results:  Recent Labs    02/10/18 0642  WBC 6.0  HGB 13.5  HCT 41.5  PLT 153   BMET Recent Labs    02/10/18 0642  NA 138  K 4.0  CL 102  CO2 30  GLUCOSE 83  BUN 11  CREATININE 0.93  CALCIUM 9.0   PT/INR No results for input(s): LABPROT, INR in the last 72 hours.  Studies/Results: Dg Colon W/water Sol Cm  Result Date: 02/10/2018 CLINICAL DATA:  Chronic constipation EXAM: WATER SOLUBLE CONTRAST ENEMA TECHNIQUE: After insertion of an enema tip, Isovue 300 diluted with water was instilled retrograde by gravity drip. Contrast passed to the cecal tip. Multiple images were obtained. Post evacuation image was also obtained. FLUOROSCOPY TIME:  Fluoroscopy Time:  3 minutes 30 seconds Radiation Exposure Index (if provided by the fluoroscopic device): 168.9 mGy Number of Acquired Spot Images: 3 plus multiple fluoroscopic screen captures COMPARISON:  CT abdomen pelvis 02/08/2018 FINDINGS: Scout image demonstrates mild scattered retained stool in colon. No bowel dilatation. Osseous structures unremarkable. Contrast easily passed from rectum to tip of cecum. Competent ileocecal valve. Appendix surgically absent by history. Scattered retained stool throughout colon. Colon normal in caliber throughout its course. Mild  redundancy of the sigmoid colon. No colonic mass or obstruction identified. Unable to exclude polyps by this exam due to the scattered retained stool. Significant retained contrast on post evacuation image. IMPRESSION: Scattered retained colonic stool. No gross evidence of colonic mass or obstruction. Electronically Signed   By: Lavonia Dana M.D.   On: 02/10/2018 15:12    Anti-infectives: Anti-infectives (From admission, onward)   Start     Dose/Rate Route Frequency Ordered Stop   02/14/18 2200  metroNIDAZOLE (FLAGYL) tablet 1,000 mg     1,000 mg Oral Once 02/11/18 1121     02/14/18 2200  neomycin (MYCIFRADIN) tablet 1,000 mg     1,000 mg Oral  Once 02/11/18 1121     02/14/18 1500  metroNIDAZOLE (FLAGYL) tablet 1,000 mg     1,000 mg Oral Once 02/11/18 1121     02/14/18 1500  neomycin (MYCIFRADIN) tablet 1,000 mg     1,000 mg Oral  Once 02/11/18 1121     02/14/18 1400  metroNIDAZOLE (FLAGYL) tablet 1,000 mg     1,000 mg Oral Once 02/11/18 1121     02/14/18 1400  neomycin (MYCIFRADIN) tablet 1,000 mg     1,000 mg Oral  Once 02/11/18 1121        Assessment/Plan: Impression: Dysmotility of large bowel Plan: Continue liquid diet.  Continue current management.  LOS: 1 day    Aviva Signs 02/12/2018

## 2018-02-12 NOTE — Plan of Care (Signed)
  Problem: Education: Goal: Knowledge of General Education information will improve Outcome: Progressing   Problem: Elimination: Goal: Will not experience complications related to bowel motility Outcome: Progressing Goal: Will not experience complications related to urinary retention Outcome: Progressing   Problem: Safety: Goal: Ability to remain free from injury will improve Outcome: Progressing

## 2018-02-13 DIAGNOSIS — K5901 Slow transit constipation: Secondary | ICD-10-CM

## 2018-02-13 DIAGNOSIS — R569 Unspecified convulsions: Secondary | ICD-10-CM

## 2018-02-13 NOTE — Progress Notes (Signed)
PROGRESS NOTE    Jermaine [name]  XBJ:[id number] DOB: [birth year] DOA: 02/08/2018 PCP: [name], MD   Brief Narrative:  37 year old with a history of bipolar disorder, opioid dependence, seizures, essential hypertension, borderline personality disorder came to the hospital with complains of constipation for over 40 days.  He has been using chronic buprenorphine, following up with Dr. Valetta Close outpatient.  Denies currently using any outpatient narcotics.  Previously he has tried multiple different type of preparation and laxatives such as GoLYTELY, senna, MiraLAX, and Amitiza without any relief.  States his stools are very thin whenever he had a previously.  CT of the abdomen pelvis done on 5/21 showed moderate to large stool burden throughout the colon without any inflammatory process.  General surgery was consulted who plans on performing partial colectomy on 5/28.   Assessment & Plan:   Principal Problem:   Constipation Active Problems:   Mood disorder (HCC)   GERD (gastroesophageal reflux disease)   Opioid dependence (HCC)   Thrombocytopenia (HCC)   Tobacco abuse  Severe constipation secondary to chronic opioid use; persist - Failed multiple bowel preparations previously.  General surgery following who plans on performing partial colectomy on 5/28 with likely primary anastamosis. Continue supportive measures.   Chronic opioid dependence - Apparently was given Percocet earlier in the month due to his abdominal pain and now has been on buprenorphine, follows Dr Valetta Close. Per documentation Dr Constance Haw had discussed the case with Dr Valetta Close in regards to discharge Buprenorphine recs at the time of discharge and the follow up.   History of seizures -Continue Keppra, lamotrigiene and Depakote  Essential hypertension - Continue home regimen-currently on clonidine 0.1 mg orally twice daily and metoprolol 50 mg twice daily  Bipolar disorder -Continue lithium  Tobacco use -Counseled to quit  using tobacco  Hyperlipidemia -Continue statin  DVT prophylaxis: SCDs Code Status: Full code Family Communication: None at bedside Disposition Plan: Maintain inpatient stay until his surgery.   Consultants:   General surgery  Procedures:   No  Antimicrobials:   None   Subjective: No bowel movement overnight.   Review of Systems Otherwise negative except as per HPI, including: General = no fevers, chills, dizziness, malaise, fatigue HEENT/EYES = negative for pain, redness, loss of vision, double vision, blurred vision, loss of hearing, sore throat, hoarseness, dysphagia Cardiovascular= negative for chest pain, palpitation, murmurs, lower extremity swelling Respiratory/lungs= negative for shortness of breath, cough, hemoptysis, wheezing, mucus production Gastrointestinal= negative for nausea, vomiting,, abdominal pain, melena, hematemesis Genitourinary= negative for Dysuria, Hematuria, Change in Urinary Frequency MSK = Negative for arthralgia, myalgias, Back Pain, Joint swelling  Neurology= Negative for headache, seizures, numbness, tingling  Psychiatry= Negative for anxiety, depression, suicidal and homocidal ideation Allergy/Immunology= Medication/Food allergy as listed  Skin= Negative for Rash, lesions, ulcers, itching   Objective: Vitals:   02/12/18 0856 02/12/18 1509 02/12/18 2102 02/13/18 0539  BP: 101/76 114/76 134/76 101/63  Pulse:  (!) 53 (!) 58 62  Resp:  16 16 16   Temp:  97.8 F (36.6 C) (!) 97.5 F (36.4 C) 97.8 F (36.6 C)  TempSrc:  Oral Oral Oral  SpO2:  92% 98% 94%  Weight:      Height:        Intake/Output Summary (Last 24 hours) at 02/13/2018 1008 Last data filed at 02/12/2018 1832 Gross per 24 hour  Intake 840 ml  Output -  Net 840 ml   Filed Weights   02/08/18 1130 02/08/18 1727  Weight: 85.3 kg (188  lb) 94.5 kg (208 lb 6.4 oz)    Examination: Constitutional: NAD, calm, comfortable Eyes: PERRL, lids and conjunctivae normal ENMT:  Mucous membranes are moist. Posterior pharynx clear of any exudate or lesions.Normal dentition.  Neck: normal, supple, no masses, no thyromegaly Respiratory: clear to auscultation bilaterally, no wheezing, no crackles. Normal respiratory effort. No accessory muscle use.  Cardiovascular: Regular rate and rhythm, no murmurs / rubs / gallops. No extremity edema. 2+ pedal pulses. No carotid bruits.  Abdomen: no tenderness, no masses palpated. No hepatosplenomegaly. Bowel sounds positive.  Musculoskeletal: no clubbing / cyanosis. No joint deformity upper and lower extremities. Good ROM, no contractures. Normal muscle tone.  Skin: no rashes, lesions, ulcers. No induration Neurologic: CN 2-12 grossly intact. Sensation intact, DTR normal. Strength 5/5 in all 4.  Psychiatric: Normal judgment and insight. Alert and oriented x 3. Normal mood.   Data Reviewed:   CBC: Recent Labs  Lab 02/08/18 1239 02/10/18 0642  WBC 6.5 6.0  NEUTROABS 3.5  --   HGB 12.8* 13.5  HCT 38.6* 41.5  MCV 93.5 93.9  PLT 154 062   Basic Metabolic Panel: Recent Labs  Lab 02/08/18 1239 02/10/18 0642  NA 135 138  K 3.7 4.0  CL 102 102  CO2 27 30  GLUCOSE 92 83  BUN 12 11  CREATININE 0.97 0.93  CALCIUM 8.4* 9.0  MG  --  2.2   GFR: Estimated Creatinine Clearance: 131.1 mL/min (by C-G formula based on SCr of 0.93 mg/dL). Liver Function Tests: Recent Labs  Lab 02/08/18 1239  AST 20  ALT 13*  ALKPHOS 51  BILITOT 0.3  PROT 6.0*  ALBUMIN 3.6   No results for input(s): LIPASE, AMYLASE in the last 168 hours. No results for input(s): AMMONIA in the last 168 hours. Coagulation Profile: No results for input(s): INR, PROTIME in the last 168 hours. Cardiac Enzymes: No results for input(s): CKTOTAL, CKMB, CKMBINDEX, TROPONINI in the last 168 hours. BNP (last 3 results) No results for input(s): PROBNP in the last 8760 hours. HbA1C: No results for input(s): HGBA1C in the last 72 hours. CBG: No results for  input(s): GLUCAP in the last 168 hours. Lipid Profile: No results for input(s): CHOL, HDL, LDLCALC, TRIG, CHOLHDL, LDLDIRECT in the last 72 hours. Thyroid Function Tests: No results for input(s): TSH, T4TOTAL, FREET4, T3FREE, THYROIDAB in the last 72 hours. Anemia Panel: No results for input(s): VITAMINB12, FOLATE, FERRITIN, TIBC, IRON, RETICCTPCT in the last 72 hours. Sepsis Labs: No results for input(s): PROCALCITON, LATICACIDVEN in the last 168 hours.  Recent Results (from the past 240 hour(s))  Surgical PCR screen     Status: None   Collection Time: 02/12/18 12:42 AM  Result Value Ref Range Status   MRSA, PCR NEGATIVE NEGATIVE Final   Staphylococcus aureus NEGATIVE NEGATIVE Final    Comment: (NOTE) The Xpert SA Assay (FDA approved for NASAL specimens in patients 86 years of age and older), is one component of a comprehensive surveillance program. It is not intended to diagnose infection nor to guide or monitor treatment. Performed at [facility], [address] [place], [place] [address]          Radiology Studies: No results found.      Scheduled Meds: . buprenorphine  8 mg Sublingual BID  . cloNIDine  0.1 mg Oral BID  . divalproex  500 mg Oral Q8H  . docusate sodium  100 mg Oral Daily  . feeding supplement (ENSURE ENLIVE)  237 mL Oral TID BM  .  lamoTRIgine  100 mg Oral BID  . levETIRAcetam  1,500 mg Oral BID  . lithium carbonate  300 mg Oral TID WC  . metoprolol tartrate  50 mg Oral BID  . [START ON 02/14/2018] metroNIDAZOLE  1,000 mg Oral Once   Followed by  . [START ON 02/14/2018] metroNIDAZOLE  1,000 mg Oral Once   Followed by  . [START ON 02/14/2018] metroNIDAZOLE  1,000 mg Oral Once  . naloxegol oxalate  25 mg Oral Daily  . [START ON 02/14/2018] neomycin  1,000 mg Oral Once   Followed by  . [START ON 02/14/2018] neomycin  1,000 mg Oral Once   Followed by  . [START ON 02/14/2018] neomycin  1,000 mg Oral Once  . nicotine  14 mg Transdermal Daily  .  pantoprazole  40 mg Oral QAC breakfast  . [START ON 02/14/2018] polyethylene glycol  255 g Oral Once  . QUEtiapine  200 mg Oral QHS  . simvastatin  10 mg Oral q1800  . sodium chloride flush  3 mL Intravenous Q12H  . [START ON 02/14/2018] sodium phosphate  1 enema Rectal Once  . [START ON 02/15/2018] sodium phosphate  1 enema Rectal Once  . trazodone  300 mg Oral QHS   Continuous Infusions: . sodium chloride       LOS: 2 days    I have spent 15 minutes face to face with the patient and on the ward discussing the patients care, assessment, plan and disposition with other care givers. >50% of the time was devoted counseling the patient about the risks and benefits of treatment and coordinating care.     Ladora Osterberg Arsenio Loader, MD [facility] Pager [phone]   If 7PM-7AM, please contact night-coverage [email or web address] Password [email or web address] 02/13/2018, 10:08 AM

## 2018-02-13 NOTE — Progress Notes (Signed)
  Subjective: No changes since yesterday.  Is on full liquid diet.  Objective: Vital signs in last 24 hours: Temp:  [97.5 F (36.4 C)-97.8 F (36.6 C)] 97.8 F (36.6 C) (05/26 0539) Pulse Rate:  [53-62] 62 (05/26 0539) Resp:  [16] 16 (05/26 0539) BP: (101-134)/(63-76) 101/63 (05/26 0539) SpO2:  [92 %-98 %] 94 % (05/26 0539) Last BM Date: 02/10/18  Intake/Output from previous day: 05/25 0701 - 05/26 0700 In: 1443 [P.O.:1440; I.V.:3] Out: -  Intake/Output this shift: No intake/output data recorded.  General appearance: alert, cooperative and no distress GI: Soft, nontender, nondistended.  Minimal bowel sounds appreciated.  Lab Results:  No results for input(s): WBC, HGB, HCT, PLT in the last 72 hours. BMET No results for input(s): NA, K, CL, CO2, GLUCOSE, BUN, CREATININE, CALCIUM in the last 72 hours. PT/INR No results for input(s): LABPROT, INR in the last 72 hours.  Studies/Results: No results found.  Anti-infectives: Anti-infectives (From admission, onward)   Start     Dose/Rate Route Frequency Ordered Stop   02/14/18 2200  metroNIDAZOLE (FLAGYL) tablet 1,000 mg     1,000 mg Oral Once 02/11/18 1121     02/14/18 2200  neomycin (MYCIFRADIN) tablet 1,000 mg     1,000 mg Oral  Once 02/11/18 1121     02/14/18 1500  metroNIDAZOLE (FLAGYL) tablet 1,000 mg     1,000 mg Oral Once 02/11/18 1121     02/14/18 1500  neomycin (MYCIFRADIN) tablet 1,000 mg     1,000 mg Oral  Once 02/11/18 1121     02/14/18 1400  metroNIDAZOLE (FLAGYL) tablet 1,000 mg     1,000 mg Oral Once 02/11/18 1121     02/14/18 1400  neomycin (MYCIFRADIN) tablet 1,000 mg     1,000 mg Oral  Once 02/11/18 1121        Assessment/Plan: Impression: Large bowel dysmotility, constipation Plan: Will change to clear liquid diet tomorrow in preparation for subtotal colectomy on 02/15/2018.  LOS: 2 days    Aviva Signs 02/13/2018

## 2018-02-14 ENCOUNTER — Encounter (HOSPITAL_COMMUNITY): Payer: Self-pay | Admitting: Anesthesiology

## 2018-02-14 MED ORDER — SODIUM CHLORIDE 0.9 % IV SOLN
2.0000 g | INTRAVENOUS | Status: DC
  Filled 2018-02-14: qty 2

## 2018-02-14 MED ORDER — GABAPENTIN 300 MG PO CAPS: 300.0000 mg | ORAL_CAPSULE | ORAL | Status: DC

## 2018-02-14 MED ORDER — CHLORHEXIDINE GLUCONATE CLOTH 2 % EX PADS
6.0000 | MEDICATED_PAD | Freq: Once | CUTANEOUS | Status: AC
  Administered 2018-02-14: 6 via TOPICAL

## 2018-02-14 MED ORDER — ACETAMINOPHEN 500 MG PO TABS: 1000.0000 mg | ORAL_TABLET | ORAL | Status: DC

## 2018-02-14 MED ORDER — CHLORHEXIDINE GLUCONATE CLOTH 2 % EX PADS
6.0000 | MEDICATED_PAD | Freq: Once | CUTANEOUS | Status: AC
  Administered 2018-02-15: 6 via TOPICAL

## 2018-02-14 NOTE — Progress Notes (Signed)
PROGRESS NOTE  Jermaine [name] XTG:[id number] DOB: 1981/05/23 DOA: 02/08/2018 PCP: [name], MD  Brief History: 37 year old male with a history of opioid dependence, bipolar disorder, seizure like activity, hypertension, borderline personality disorder presenting with constipation without a bowel movement forover40 days. The patient has been on chronic buprenorphine which he has been getting from Dr. Valetta Close. He denies any other new opioids except for the oxycodone he received after his surgical procedure on 01/14/2018 when he underwent a left hydrocelectomy.He states that he drank 1 preparation of GoLYTELY on 02/07/2018 without a bowel movement. He states that he was taking Amitiza along with MiraLAX and Senokot at home. He complains of intermittent episodes of vomiting lasting 20 to 30 minutes. ; he does state he had a small, thin stool about width of pinky around 5/18. Denies any abdominal pain unless abdomen is palpated.02/08/2018 CT abdomen and pelvis was essentially negative for acute findings except for moderate to large amount of stool throughout the colon without inflammatory process. GI was consulted to assist with management.  Despite numerous enemas and oral medications, the patient has not had a bowel movement.  General surgery was consulted.  Subtotal colectomy is planned for 01/20/18.   Assessment/Plan: Constipation -Secondary to chronic opioid dependence -Appreciate GI follow-up -Continue bowel preparation including milk of molasses enema, Movantik, Amitiza -Continue Dulcolax and MiraLAX every hour x5 hours -still no BM-->general surgery consulted -water soluable enema-->made it to cecum, but no BM -appreciate general surgery-->subtotal colectomy 02/15/18  Chronic opioid dependence -The [place] Controlled Substance Reporting System has been queried for this patient -The patient last filled Percocet 5/325, #30 on 01/28/2018 -The patient last filled  buprenorphine 8/2 #22 on 01/24/2018 -Dr. Constance Haw discussed with Dr. Florentina Jenny, pt's pain management physician -when stable for d/c, plan to d/c with enough buprenorphine until his appointment with Dr. Valetta Close on 02/21/18  Thrombocytopenia -chronic -B12--524 -TSH--1.240 -HIV--neg -likely medication related -am CBC  Seizure like activity -follows Dr. Krista Blue -continue keppra, depakote  Hypertension -Continue clonidine and metoprolol tartrate  Bipolar disorder/borderline personality disorder -Continue Depakote, lithium, Seroquel, trazodone,Lamictal  Tobacco abuse -I have discussed tobacco cessation with the patient. I have counseled the patient regarding the negative impacts of continued tobacco use including but not limited to lung cancer, COPD, and cardiovascular disease. I have discussed alternatives to tobacco and modalities that may help facilitate tobacco cessation including but not limited to biofeedback, hypnosis, and medications. Total time spent with tobacco counseling was 4 minutes.     Disposition Plan: Home when cleared by surgery after subtotal colectomy Family Communication:NoFamily at bedside   Consultants:GI, general surgery  Code Status: FULL  DVT Prophylaxis: SCDs   Procedures: As Listed in Progress Note Above  Antibiotics: None      Subjective: Patient states that his abdominal pain is about the same as the last few days.  He denies any worsening.  He is passing flatus.  Still no bowel movement.  He denies any nausea or vomiting.  There is no fevers, chills, chest pain, shortness breath, headache, neck pain.  Objective: Vitals:   02/13/18 1254 02/13/18 2139 02/14/18 0611 02/14/18 1038  BP: (!) 111/59 (!) 142/125 117/86 110/80  Pulse: 61 (!) 50 (!) 56 71  Resp: 18 18 18    Temp: 97.7 F (36.5 C) 98.1 F (36.7 C) 98.3 F (36.8 C) 98.6 F (37 C)  TempSrc: Oral Oral Oral Oral  SpO2: 95% 94% 99% 99%  Weight:  Height:        Intake/Output Summary (Last 24 hours) at 02/14/2018 1547 Last data filed at 02/14/2018 1500 Gross per 24 hour  Intake 3360 ml  Output 1000 ml  Net 2360 ml   Weight change:  Exam:   General:  Pt is alert, follows commands appropriately, not in acute distress  HEENT: No icterus, No thrush, No neck mass, [place]/AT  Cardiovascular: RRR, S1/S2, no rubs, no gallops  Respiratory: CTA bilaterally, no wheezing, no crackles, no rhonchi  Abdomen: Soft/+BS, mild diffuse tender, non distended, no guarding  Extremities: No edema, No lymphangitis, No petechiae, No rashes, no synovitis   Data Reviewed: I have personally reviewed following labs and imaging studies Basic Metabolic Panel: Recent Labs  Lab 02/08/18 1239 02/10/18 0642  NA 135 138  K 3.7 4.0  CL 102 102  CO2 27 30  GLUCOSE 92 83  BUN 12 11  CREATININE 0.97 0.93  CALCIUM 8.4* 9.0  MG  --  2.2   Liver Function Tests: Recent Labs  Lab 02/08/18 1239  AST 20  ALT 13*  ALKPHOS 51  BILITOT 0.3  PROT 6.0*  ALBUMIN 3.6   No results for input(s): LIPASE, AMYLASE in the last 168 hours. No results for input(s): AMMONIA in the last 168 hours. Coagulation Profile: No results for input(s): INR, PROTIME in the last 168 hours. CBC: Recent Labs  Lab 02/08/18 1239 02/10/18 0642  WBC 6.5 6.0  NEUTROABS 3.5  --   HGB 12.8* 13.5  HCT 38.6* 41.5  MCV 93.5 93.9  PLT 154 153   Cardiac Enzymes: No results for input(s): CKTOTAL, CKMB, CKMBINDEX, TROPONINI in the last 168 hours. BNP: Invalid input(s): POCBNP CBG: No results for input(s): GLUCAP in the last 168 hours. HbA1C: No results for input(s): HGBA1C in the last 72 hours. Urine analysis:    Component Value Date/Time   COLORURINE STRAW (A) 11/14/2017 2108   APPEARANCEUR CLEAR 11/14/2017 2108   LABSPEC 1.008 11/14/2017 2108   PHURINE 8.0 11/14/2017 2108   GLUCOSEU NEGATIVE 11/14/2017 2108   HGBUR NEGATIVE 11/14/2017 2108   BILIRUBINUR NEGATIVE  11/14/2017 2108   KETONESUR 20 (A) 11/14/2017 2108   PROTEINUR NEGATIVE 11/14/2017 2108   UROBILINOGEN 0.2 12/18/2009 1312   NITRITE NEGATIVE 11/14/2017 2108   LEUKOCYTESUR NEGATIVE 11/14/2017 2108   Sepsis Labs: @LABRCNTIP (procalcitonin:4,lacticidven:4) ) Recent Results (from the past 240 hour(s))  Surgical PCR screen     Status: None   Collection Time: 02/12/18 12:42 AM  Result Value Ref Range Status   MRSA, PCR NEGATIVE NEGATIVE Final   Staphylococcus aureus NEGATIVE NEGATIVE Final    Comment: (NOTE) The Xpert SA Assay (FDA approved for NASAL specimens in patients 67 years of age and older), is one component of a comprehensive surveillance program. It is not intended to diagnose infection nor to guide or monitor treatment. Performed at [facility], [address] [place], [place] [address]      Scheduled Meds: . [START ON 02/15/2018] acetaminophen  1,000 mg Oral On Call to OR  . buprenorphine  8 mg Sublingual BID  . Chlorhexidine Gluconate Cloth  6 each Topical Once   And  . Chlorhexidine Gluconate Cloth  6 each Topical Once  . cloNIDine  0.1 mg Oral BID  . divalproex  500 mg Oral Q8H  . docusate sodium  100 mg Oral Daily  . feeding supplement (ENSURE ENLIVE)  237 mL Oral TID BM  . [START ON 02/15/2018] gabapentin  300 mg Oral On Call  to OR  . lamoTRIgine  100 mg Oral BID  . levETIRAcetam  1,500 mg Oral BID  . lithium carbonate  300 mg Oral TID WC  . metoprolol tartrate  50 mg Oral BID  . metroNIDAZOLE  1,000 mg Oral Once  . naloxegol oxalate  25 mg Oral Daily  . neomycin  1,000 mg Oral Once  . nicotine  14 mg Transdermal Daily  . pantoprazole  40 mg Oral QAC breakfast  . QUEtiapine  200 mg Oral QHS  . simvastatin  10 mg Oral q1800  . sodium chloride flush  3 mL Intravenous Q12H  . sodium phosphate  1 enema Rectal Once  . [START ON 02/15/2018] sodium phosphate  1 enema Rectal Once  . trazodone  300 mg Oral QHS   Continuous Infusions: . sodium chloride    .  [START ON 02/15/2018] cefoTEtan (CEFOTAN) IV      Procedures/Studies: Ct Abdomen Pelvis W Contrast  Result Date: 02/08/2018 CLINICAL DATA:  Constipation and nausea and vomiting. EXAM: CT ABDOMEN AND PELVIS WITH CONTRAST TECHNIQUE: Multidetector CT imaging of the abdomen and pelvis was performed using the standard protocol following bolus administration of intravenous contrast. CONTRAST:  171mL ISOVUE-300 IOPAMIDOL (ISOVUE-300) INJECTION 61% COMPARISON:  CT scan 11/14/2017 FINDINGS: Lower chest: The lung bases are clear of acute process. No pleural effusion or pulmonary lesions. The heart is normal in size. No pericardial effusion. The distal esophagus and aorta are unremarkable. Hepatobiliary: No focal hepatic lesions or intrahepatic biliary dilatation. The gallbladder is normal. No common bile duct dilatation. Pancreas: No mass, inflammation or ductal dilatation. Spleen: Normal size.  No focal lesions. Adrenals/Urinary Tract: The adrenal glands and kidneys are unremarkable. No renal, ureteral or bladder calculi or mass. Stomach/Bowel: The stomach, duodenum, small bowel and terminal ileum are normal. The appendix is surgically absent. There is a moderate to large amount of stool throughout the colon and down into the rectum suggesting constipation. No acute inflammatory process, mass lesions or obstructive findings. Vascular/Lymphatic: The aorta is normal in caliber. No dissection. The branch vessels are patent. The major venous structures are patent. No mesenteric or retroperitoneal mass or adenopathy. Small scattered lymph nodes are noted. Reproductive: The prostate gland and seminal vesicles are unremarkable. Suspect left-sided scrotal varicocele. Other: No pelvic mass or adenopathy. No free pelvic fluid collections. No inguinal mass or adenopathy. No abdominal wall hernia or subcutaneous lesions. Musculoskeletal: No significant bony findings. IMPRESSION: 1. No acute abdominal/pelvic findings, mass lesions  or lymphadenopathy. 2. Moderate to large amount of stool throughout the colon and down into the rectum suggesting constipation. Electronically Signed   By: Marijo Sanes M.D.   On: 02/08/2018 14:14   Dg Abd Acute W/chest  Result Date: 01/29/2018 CLINICAL DATA:  Constipation, unspecified EXAM: DG ABDOMEN ACUTE W/ 1V CHEST COMPARISON:  11/14/2017 abdominal CT FINDINGS: History of constipation with formed stool seen from the cecum to rectum. No rectal distention. No evidence of small bowel obstruction. No concerning mass effect or calcification. On the chest view interstitial markings are somewhat coarsened but this is likely technical based on the other view and previous imaging. Normal heart size and mediastinal contours. IMPRESSION: Formed stool throughout the colon correlating with constipation history. No rectal distention or obstructive change. Electronically Signed   By: Monte Fantasia M.D.   On: 01/29/2018 06:26   Dg Colon W/water Sol Cm  Result Date: 02/10/2018 CLINICAL DATA:  Chronic constipation EXAM: WATER SOLUBLE CONTRAST ENEMA TECHNIQUE: After insertion of an enema tip, Isovue  300 diluted with water was instilled retrograde by gravity drip. Contrast passed to the cecal tip. Multiple images were obtained. Post evacuation image was also obtained. FLUOROSCOPY TIME:  Fluoroscopy Time:  3 minutes 30 seconds Radiation Exposure Index (if provided by the fluoroscopic device): 168.9 mGy Number of Acquired Spot Images: 3 plus multiple fluoroscopic screen captures COMPARISON:  CT abdomen pelvis 02/08/2018 FINDINGS: Scout image demonstrates mild scattered retained stool in colon. No bowel dilatation. Osseous structures unremarkable. Contrast easily passed from rectum to tip of cecum. Competent ileocecal valve. Appendix surgically absent by history. Scattered retained stool throughout colon. Colon normal in caliber throughout its course. Mild redundancy of the sigmoid colon. No colonic mass or obstruction  identified. Unable to exclude polyps by this exam due to the scattered retained stool. Significant retained contrast on post evacuation image. IMPRESSION: Scattered retained colonic stool. No gross evidence of colonic mass or obstruction. Electronically Signed   By: Lavonia Dana M.D.   On: 02/10/2018 15:12    Orson Eva, DO  [facility] Pager [phone]  If 7PM-7AM, please contact night-coverage [email or web address] Password [email or web address] 02/14/2018, 3:47 PM   LOS: 3 days

## 2018-02-14 NOTE — Progress Notes (Signed)
  Subjective: No complaints.  Objective: Vital signs in last 24 hours: Temp:  [97.7 F (36.5 C)-98.6 F (37 C)] 98.6 F (37 C) (05/27 1038) Pulse Rate:  [50-71] 71 (05/27 1038) Resp:  [18] 18 (05/27 0611) BP: (110-142)/(59-125) 110/80 (05/27 1038) SpO2:  [94 %-99 %] 99 % (05/27 1038) Last BM Date: 02/10/18  Intake/Output from previous day: 05/26 0701 - 05/27 0700 In: 960 [P.O.:960] Out: -  Intake/Output this shift: No intake/output data recorded.  General appearance: alert, cooperative and no distress GI: Soft, nontender, nondistended.  Lab Results:  No results for input(s): WBC, HGB, HCT, PLT in the last 72 hours. BMET No results for input(s): NA, K, CL, CO2, GLUCOSE, BUN, CREATININE, CALCIUM in the last 72 hours. PT/INR No results for input(s): LABPROT, INR in the last 72 hours.  Studies/Results: No results found.  Anti-infectives: Anti-infectives (From admission, onward)   Start     Dose/Rate Route Frequency Ordered Stop   02/14/18 2200  metroNIDAZOLE (FLAGYL) tablet 1,000 mg     1,000 mg Oral Once 02/11/18 1121     02/14/18 2200  neomycin (MYCIFRADIN) tablet 1,000 mg     1,000 mg Oral  Once 02/11/18 1121     02/14/18 1500  metroNIDAZOLE (FLAGYL) tablet 1,000 mg     1,000 mg Oral Once 02/11/18 1121     02/14/18 1500  neomycin (MYCIFRADIN) tablet 1,000 mg     1,000 mg Oral  Once 02/11/18 1121     02/14/18 1400  metroNIDAZOLE (FLAGYL) tablet 1,000 mg     1,000 mg Oral Once 02/11/18 1121     02/14/18 1400  neomycin (MYCIFRADIN) tablet 1,000 mg     1,000 mg Oral  Once 02/11/18 1121        Assessment/Plan: Impression: Large bowel dysmotility Plan: Patient is scheduled for subtotal colectomy tomorrow by Dr. Constance Haw.  Preoperative orders are in place.  LOS: 3 days    Aviva Signs 02/14/2018

## 2018-02-15 ENCOUNTER — Encounter (HOSPITAL_COMMUNITY)
Admission: EM | Disposition: A | Discharge status: Home / Self Care | Payer: Self-pay | Source: Home / Self Care | Attending: Internal Medicine

## 2018-02-15 ENCOUNTER — Encounter (HOSPITAL_COMMUNITY): Payer: Self-pay | Admitting: Anesthesiology

## 2018-02-15 SURGERY — COLECTOMY, PARTIAL
Anesthesia: General

## 2018-02-15 MED ORDER — MIDAZOLAM HCL 2 MG/2ML IJ SOLN
INTRAMUSCULAR | Status: AC
  Filled 2018-02-15: qty 2

## 2018-02-15 MED ORDER — NALOXEGOL OXALATE 25 MG PO TABS: 25.0000 mg | ORAL_TABLET | Freq: Every day | ORAL | 0 refills | Status: DC

## 2018-02-15 MED ORDER — ROCURONIUM BROMIDE 50 MG/5ML IV SOLN
INTRAVENOUS | Status: AC
  Filled 2018-02-15: qty 1

## 2018-02-15 MED ORDER — ONDANSETRON HCL 4 MG/2ML IJ SOLN
INTRAMUSCULAR | Status: AC
  Filled 2018-02-15: qty 2

## 2018-02-15 MED ORDER — LIDOCAINE HCL (PF) 1 % IJ SOLN
INTRAMUSCULAR | Status: AC
  Filled 2018-02-15: qty 5

## 2018-02-15 MED ORDER — FENTANYL CITRATE (PF) 250 MCG/5ML IJ SOLN
INTRAMUSCULAR | Status: AC
  Filled 2018-02-15: qty 5

## 2018-02-15 SURGICAL SUPPLY — 44 items
CELLS DAT CNTRL 66122 CELL SVR (MISCELLANEOUS) IMPLANT
CHLORAPREP W/TINT 26ML (MISCELLANEOUS) ×1 IMPLANT
CLOTH BEACON ORANGE TIMEOUT ST (SAFETY) ×1 IMPLANT
COVER LIGHT HANDLE STERIS (MISCELLANEOUS) ×2 IMPLANT
DRSG OPSITE POSTOP 4X10 (GAUZE/BANDAGES/DRESSINGS) IMPLANT
DRSG OPSITE POSTOP 4X8 (GAUZE/BANDAGES/DRESSINGS) IMPLANT
ELECT REM PT RETURN 9FT ADLT (ELECTROSURGICAL)
ELECTRODE REM PT RTRN 9FT ADLT (ELECTROSURGICAL) ×1 IMPLANT
GLOVE BIO SURGEON STRL SZ 6.5 (GLOVE) ×2 IMPLANT
GLOVE BIOGEL PI IND STRL 6.5 (GLOVE) ×2 IMPLANT
GLOVE BIOGEL PI IND STRL 7.0 (GLOVE) ×4 IMPLANT
GLOVE BIOGEL PI INDICATOR 6.5 (GLOVE)
GLOVE BIOGEL PI INDICATOR 7.0 (GLOVE)
GLOVE SURG SS PI 7.5 STRL IVOR (GLOVE) ×2 IMPLANT
GOWN STRL REUS W/TWL LRG LVL3 (GOWN DISPOSABLE) ×6 IMPLANT
HANDLE SUCTION POOLE (INSTRUMENTS) ×1 IMPLANT
INST SET MAJOR GENERAL (KITS) ×1 IMPLANT
KIT BLADEGUARD II DBL (SET/KITS/TRAYS/PACK) ×1 IMPLANT
KIT TURNOVER KIT A (KITS) ×1 IMPLANT
LIGASURE IMPACT 36 18CM CVD LR (INSTRUMENTS) ×1 IMPLANT
MANIFOLD NEPTUNE II (INSTRUMENTS) ×1 IMPLANT
NDL HYPO 18GX1.5 BLUNT FILL (NEEDLE) ×1 IMPLANT
NDL HYPO 21X1.5 SAFETY (NEEDLE) ×1 IMPLANT
NEEDLE HYPO 18GX1.5 BLUNT FILL (NEEDLE) IMPLANT
NEEDLE HYPO 21X1.5 SAFETY (NEEDLE) IMPLANT
NS IRRIG 1000ML POUR BTL (IV SOLUTION) ×2 IMPLANT
PAD ARMBOARD 7.5X6 YLW CONV (MISCELLANEOUS) ×1 IMPLANT
RELOAD LINEAR CUT PROX 55 BLUE (ENDOMECHANICALS) IMPLANT
RELOAD PROXIMATE 75MM BLUE (ENDOMECHANICALS) IMPLANT
RELOAD STAPLE 55 3.8 BLU REG (ENDOMECHANICALS) IMPLANT
RELOAD STAPLE 75 3.8 BLU REG (ENDOMECHANICALS) IMPLANT
RETRACTOR WND ALEXIS 18 MED (MISCELLANEOUS) IMPLANT
RETRACTOR WND ALEXIS 25 LRG (MISCELLANEOUS) IMPLANT
RTRCTR WOUND ALEXIS 18CM MED (MISCELLANEOUS)
RTRCTR WOUND ALEXIS 25CM LRG (MISCELLANEOUS)
SET BASIN LINEN APH (SET/KITS/TRAYS/PACK) ×1 IMPLANT
SPONGE LAP 18X18 X RAY DECT (DISPOSABLE) ×1 IMPLANT
STAPLER GUN LINEAR PROX 60 (STAPLE) ×1 IMPLANT
STAPLER PROXIMATE 55 BLUE (STAPLE) IMPLANT
STAPLER PROXIMATE 75MM BLUE (STAPLE) IMPLANT
STAPLER VISISTAT (STAPLE) ×1 IMPLANT
SUCTION POOLE HANDLE (INSTRUMENTS)
SUT SILK 3 0 SH CR/8 (SUTURE) IMPLANT
TRAY FOLEY MTR SLVR 16FR STAT (SET/KITS/TRAYS/PACK) ×1 IMPLANT

## 2018-02-15 NOTE — Progress Notes (Signed)
[facility]  RN informed me that patient is very uncooperative and is smoking in the room and is drinking water from the grey water pitcher. I spoke with him this AM and he was on board for surgery.  Given the lack of NPO, forced to cancel the surgery today. Given the uncooperative nature and fact that he is ignoring our orders /recommendations, I have no plan for surgery at this time as he is not demonstrating that he will be cooperative or follow our recommendations after surgery.   Surgery Canceled. Will update Dr. Carles Collet.   Will talk to the patient later this afternoon.   Curlene Labrum, MD [facility] [address] [place], [place] [address] [phone] (office)

## 2018-02-15 NOTE — Progress Notes (Signed)
[facility]  Discussed with patient. No additional time in schedule to surgery this week. Discussed smoking cessation. Discussed outpatient follow up.  Would do bowel regimen.  Continue the movantik daily, miralax daily PRN if no BM and enemas if no BM following those maneuvers.   Can d/c home once Dr. Carles Collet feels appropriate.   Updated his Pain Doctor, Dr. Valetta Close regarding no surgery.   Curlene Labrum, MD [facility] [address] [place], [place] [address] [phone] (office)

## 2018-02-15 NOTE — Progress Notes (Signed)
Patient prepped and ready to go to surgery. Transporters reported to this nurse that upon entering the room, patient in 339 had the ice jug turnwed up to his mouth drinking from it. Patient reportedly told transporters that he "did not have that much."  A second time the transported went into room 339, the patient had a can of soda in his hand and was pretending he was giving it to his son. This nurse reported the smoking and drinking incidents to Dr. Constance Haw and she decided to cancel the surgery.

## 2018-02-15 NOTE — Progress Notes (Signed)
Late entry:  Smoke odor in hallway and in room 339.  Visitorss now in room and patient out of bed and window open.  Patient denies smoking.  Patient educated about not smoking in hospital and oxygen present in room.  Patient verbalized understanding.  Nicotine patch to be placed.

## 2018-02-15 NOTE — Care Management Note (Signed)
Case Management Note  Patient Details  Name: LANDEN KNOEDLER MRN: [id number] Date of Birth: 04/06/1981  If discussed at Long Length of Stay Meetings, dates discussed:   02/15/18   Sherald Barge, RN 02/15/2018, 12:06 PM

## 2018-02-15 NOTE — Discharge Summary (Signed)
Physician Discharge Summary  Jermaine [name] DVV:[id number] DOB: 03/03/1981 DOA: 02/08/2018  PCP: [name], MD  Admit date: 02/08/2018 Discharge date: 02/15/2018  Admitted From: Home Disposition:  Home   Recommendations for Outpatient Follow-up:  1. Follow up with PCP in 1-2 weeks 2. Please obtain BMP/CBC in one week    Discharge Condition: Stable CODE STATUS:FULL Diet recommendation: Heart Healthy   Brief/Interim Summary: 37 year old male with a history of opioid dependence, bipolar disorder, seizure like activity, hypertension, borderline personality disorder presenting with constipation without a bowel movement forover40 days. The patient has been on chronic buprenorphine which he has been getting from Dr. Valetta Close. He denies any other new opioids except for the oxycodone he received after his surgical procedure on 01/14/2018 when he underwent a left hydrocelectomy.He states that he drank 1 preparation of GoLYTELY on 02/07/2018 without a bowel movement. He states that he was taking Amitiza along with MiraLAX and Senokot at home. He complains of intermittent episodes of vomiting lasting 20 to 30 minutes. ; he does state he had a small, thin stool about width of pinky around 5/18. Denies any abdominal pain unless abdomen is palpated.02/08/2018 CT abdomen and pelvis was essentially negative for acute findings except for moderate to large amount of stool throughout the colon without inflammatory process. GI was consulted to assist with management. Despite numerous enemas and oral medications, the patient has not had a bowel movement. General surgery was consulted. Subtotal colectomy is planned for 01/20/18.  However, on the day of surgery the patient took it upon himself to eat and drink despite his n.p.o. status for surgery.  As result, Dr. Constance Haw canceled his surgery.  Going forward, there was concern given the patient's ongoing cooperative behavior that he would not follow  postoperative orders and recommendations.  The case was discussed with GI, and all consultants felt the patient was stable to be discharged home with out patient follow up.  Moreover, there was speculation during the entire hospitalization including the nursing and ancillary staff that the patient may have had bowel movements that he did not report to the nursing staff.  Patient was extremely private during entire hospitalization always keeping his door shut at all times.  Nevertheless, the patient tolerated his diet without any worsening abdominal pain or complications.    Discharge Diagnoses:  Constipation -Secondary to chronic opioid dependence -Appreciate GI follow-up -Continue bowel preparation including milk of molasses enema, Movantik, Amitiza -Continue Dulcolax and MiraLAX every hour x5 hours -still no BM-->general surgery consulted -water soluable enema-->made it to cecum, but no BM -appreciate general surgery-->subtotal colectomy5/28/19 -Patient was uncooperative with nursing staff on the day of surgery and eat and drink despite instructions to remain n.p.o. -Surgery was canceled -Going forward, Given the patient's numerous psychosocial issues there was concern that he would follow postoperative instructions -The case was discussed with GI and general surgery who felt the patient was stable for discharge home with outpatient follow-up.  Chronic opioid dependence -The [place] Controlled Substance Reporting System has been queried for this patient -The patient last filled Percocet 5/325, #30 on 01/28/2018 -The patient last filled buprenorphine 8/2 #22 on 01/24/2018 -Dr. Constance Haw discussed with Dr. Florentina Jenny, pt's pain management physician -when stable for d/c, plan to d/c with enough buprenorphine until his appointment with Dr. Valetta Close on 02/21/18  Thrombocytopenia -chronic -B12--524 -TSH--1.240 -HIV--neg -likely medication related  Seizure like activity -follows Dr.  Krista Blue -continue keppra, depakote  Hypertension -Continue clonidine and metoprolol tartrate  Bipolar disorder/borderline personality  disorder -Continue Depakote, lithium, Seroquel, trazodone,Lamictal  Tobacco abuse -I have discussed tobacco cessation with the patient. I have counseled the patient regarding the negative impacts of continued tobacco use including but not limited to lung cancer, COPD, and cardiovascular disease. I have discussed alternatives to tobacco and modalities that may help facilitate tobacco cessation including but not limited to biofeedback, hypnosis, and medications. Total time spent with tobacco counseling was 4 minutes.      Discharge Instructions   Allergies as of 02/15/2018      Reactions   Hydrocodone Itching      Medication List    STOP taking these medications   lubiprostone 24 MCG capsule Commonly known as:  AMITIZA     TAKE these medications   cloNIDine 0.1 MG tablet Commonly known as:  CATAPRES Take 0.1 mg by mouth 2 (two) times daily.   divalproex 250 MG DR tablet Commonly known as:  DEPAKOTE Take 2 tablets (500 mg total) by mouth 3 (three) times daily.   docusate sodium 100 MG capsule Commonly known as:  COLACE Take 100 mg by mouth daily.   hydrOXYzine 25 MG capsule Commonly known as:  VISTARIL Take 25 mg by mouth 3 (three) times daily as needed for anxiety.   lamoTRIgine 100 MG tablet Commonly known as:  LAMICTAL Take 1 tablet (100 mg total) by mouth 2 (two) times daily.   levETIRAcetam 500 MG tablet Commonly known as:  KEPPRA Take 1,500 mg by mouth 2 (two) times daily.   lithium carbonate 300 MG capsule Take 1 capsule (300 mg total) by mouth 3 (three) times daily with meals.   metoprolol tartrate 50 MG tablet Commonly known as:  LOPRESSOR Take 1 tablet (50 mg total) by mouth 2 (two) times daily.   naloxegol oxalate 25 MG Tabs tablet Commonly known as:  MOVANTIK Take 1 tablet (25 mg total) by mouth daily. Start  taking on:  02/16/2018   omeprazole 20 MG capsule Commonly known as:  PRILOSEC TAKE ONE CAPSULE BY MOUTH ONCE DAILY   ondansetron 4 MG disintegrating tablet Commonly known as:  ZOFRAN ODT Take 1 tablet (4 mg total) by mouth every 8 (eight) hours as needed for nausea or vomiting.   polyethylene glycol powder powder Commonly known as:  GLYCOLAX/MIRALAX Take 17 g by mouth at bedtime.   PROAIR HFA 108 (90 Base) MCG/ACT inhaler Generic drug:  albuterol Inhale 2 puffs into the lungs every 4 (four) hours as needed.   promethazine 25 MG suppository Commonly known as:  PHENERGAN Place 1 suppository (25 mg total) rectally every 6 (six) hours as needed for nausea or vomiting. What changed:  Another medication with the same name was removed. Continue taking this medication, and follow the directions you see here.   QUEtiapine 200 MG tablet Commonly known as:  SEROQUEL Take 200 mg by mouth at bedtime.   senna 8.6 MG Tabs tablet Commonly known as:  SENOKOT Take 1 tablet by mouth daily.   simvastatin 10 MG tablet Commonly known as:  ZOCOR Take 10 mg by mouth daily.   SUBOXONE 8-2 MG Film Generic drug:  Buprenorphine HCl-Naloxone HCl Place 1 Film under the tongue 2 (two) times daily.   trazodone 300 MG tablet Commonly known as:  DESYREL Take 300 mg by mouth at bedtime.       Allergies  Allergen Reactions  . Hydrocodone Itching    Consultations:  GI  General surgery   Procedures/Studies: Ct Abdomen Pelvis W Contrast  Result Date: 02/08/2018  CLINICAL DATA:  Constipation and nausea and vomiting. EXAM: CT ABDOMEN AND PELVIS WITH CONTRAST TECHNIQUE: Multidetector CT imaging of the abdomen and pelvis was performed using the standard protocol following bolus administration of intravenous contrast. CONTRAST:  16mL ISOVUE-300 IOPAMIDOL (ISOVUE-300) INJECTION 61% COMPARISON:  CT scan 11/14/2017 FINDINGS: Lower chest: The lung bases are clear of acute process. No pleural effusion or  pulmonary lesions. The heart is normal in size. No pericardial effusion. The distal esophagus and aorta are unremarkable. Hepatobiliary: No focal hepatic lesions or intrahepatic biliary dilatation. The gallbladder is normal. No common bile duct dilatation. Pancreas: No mass, inflammation or ductal dilatation. Spleen: Normal size.  No focal lesions. Adrenals/Urinary Tract: The adrenal glands and kidneys are unremarkable. No renal, ureteral or bladder calculi or mass. Stomach/Bowel: The stomach, duodenum, small bowel and terminal ileum are normal. The appendix is surgically absent. There is a moderate to large amount of stool throughout the colon and down into the rectum suggesting constipation. No acute inflammatory process, mass lesions or obstructive findings. Vascular/Lymphatic: The aorta is normal in caliber. No dissection. The branch vessels are patent. The major venous structures are patent. No mesenteric or retroperitoneal mass or adenopathy. Small scattered lymph nodes are noted. Reproductive: The prostate gland and seminal vesicles are unremarkable. Suspect left-sided scrotal varicocele. Other: No pelvic mass or adenopathy. No free pelvic fluid collections. No inguinal mass or adenopathy. No abdominal wall hernia or subcutaneous lesions. Musculoskeletal: No significant bony findings. IMPRESSION: 1. No acute abdominal/pelvic findings, mass lesions or lymphadenopathy. 2. Moderate to large amount of stool throughout the colon and down into the rectum suggesting constipation. Electronically Signed   By: Marijo Sanes M.D.   On: 02/08/2018 14:14   Dg Abd Acute W/chest  Result Date: 01/29/2018 CLINICAL DATA:  Constipation, unspecified EXAM: DG ABDOMEN ACUTE W/ 1V CHEST COMPARISON:  11/14/2017 abdominal CT FINDINGS: History of constipation with formed stool seen from the cecum to rectum. No rectal distention. No evidence of small bowel obstruction. No concerning mass effect or calcification. On the chest view  interstitial markings are somewhat coarsened but this is likely technical based on the other view and previous imaging. Normal heart size and mediastinal contours. IMPRESSION: Formed stool throughout the colon correlating with constipation history. No rectal distention or obstructive change. Electronically Signed   By: Monte Fantasia M.D.   On: 01/29/2018 06:26   Dg Colon W/water Sol Cm  Result Date: 02/10/2018 CLINICAL DATA:  Chronic constipation EXAM: WATER SOLUBLE CONTRAST ENEMA TECHNIQUE: After insertion of an enema tip, Isovue 300 diluted with water was instilled retrograde by gravity drip. Contrast passed to the cecal tip. Multiple images were obtained. Post evacuation image was also obtained. FLUOROSCOPY TIME:  Fluoroscopy Time:  3 minutes 30 seconds Radiation Exposure Index (if provided by the fluoroscopic device): 168.9 mGy Number of Acquired Spot Images: 3 plus multiple fluoroscopic screen captures COMPARISON:  CT abdomen pelvis 02/08/2018 FINDINGS: Scout image demonstrates mild scattered retained stool in colon. No bowel dilatation. Osseous structures unremarkable. Contrast easily passed from rectum to tip of cecum. Competent ileocecal valve. Appendix surgically absent by history. Scattered retained stool throughout colon. Colon normal in caliber throughout its course. Mild redundancy of the sigmoid colon. No colonic mass or obstruction identified. Unable to exclude polyps by this exam due to the scattered retained stool. Significant retained contrast on post evacuation image. IMPRESSION: Scattered retained colonic stool. No gross evidence of colonic mass or obstruction. Electronically Signed   By: Lavonia Dana M.D.   On:  02/10/2018 15:12         Discharge Exam: Vitals:   02/15/18 0946 02/15/18 1427  BP: 110/73 127/67  Pulse: 61 96  Resp:  18  Temp: 98.5 F (36.9 C) 97.9 F (36.6 C)  SpO2: 96% 96%   Vitals:   02/14/18 2142 02/15/18 0650 02/15/18 0946 02/15/18 1427  BP: (!) 117/97  120/60 110/73 127/67  Pulse: 76 60 61 96  Resp: 19 18  18   Temp: 97.6 F (36.4 C) 98.1 F (36.7 C) 98.5 F (36.9 C) 97.9 F (36.6 C)  TempSrc: Oral  Oral   SpO2: 99% 97% 96% 96%  Weight:      Height:        General: Pt is alert, awake, not in acute distress Cardiovascular: RRR, S1/S2 +, no rubs, no gallops Respiratory: CTA bilaterally, no wheezing, no rhonchi Abdominal: Soft, NT, ND, bowel sounds + Extremities: no edema, no cyanosis   The results of significant diagnostics from this hospitalization (including imaging, microbiology, ancillary and laboratory) are listed below for reference.    Significant Diagnostic Studies: Ct Abdomen Pelvis W Contrast  Result Date: 02/08/2018 CLINICAL DATA:  Constipation and nausea and vomiting. EXAM: CT ABDOMEN AND PELVIS WITH CONTRAST TECHNIQUE: Multidetector CT imaging of the abdomen and pelvis was performed using the standard protocol following bolus administration of intravenous contrast. CONTRAST:  147mL ISOVUE-300 IOPAMIDOL (ISOVUE-300) INJECTION 61% COMPARISON:  CT scan 11/14/2017 FINDINGS: Lower chest: The lung bases are clear of acute process. No pleural effusion or pulmonary lesions. The heart is normal in size. No pericardial effusion. The distal esophagus and aorta are unremarkable. Hepatobiliary: No focal hepatic lesions or intrahepatic biliary dilatation. The gallbladder is normal. No common bile duct dilatation. Pancreas: No mass, inflammation or ductal dilatation. Spleen: Normal size.  No focal lesions. Adrenals/Urinary Tract: The adrenal glands and kidneys are unremarkable. No renal, ureteral or bladder calculi or mass. Stomach/Bowel: The stomach, duodenum, small bowel and terminal ileum are normal. The appendix is surgically absent. There is a moderate to large amount of stool throughout the colon and down into the rectum suggesting constipation. No acute inflammatory process, mass lesions or obstructive findings. Vascular/Lymphatic: The  aorta is normal in caliber. No dissection. The branch vessels are patent. The major venous structures are patent. No mesenteric or retroperitoneal mass or adenopathy. Small scattered lymph nodes are noted. Reproductive: The prostate gland and seminal vesicles are unremarkable. Suspect left-sided scrotal varicocele. Other: No pelvic mass or adenopathy. No free pelvic fluid collections. No inguinal mass or adenopathy. No abdominal wall hernia or subcutaneous lesions. Musculoskeletal: No significant bony findings. IMPRESSION: 1. No acute abdominal/pelvic findings, mass lesions or lymphadenopathy. 2. Moderate to large amount of stool throughout the colon and down into the rectum suggesting constipation. Electronically Signed   By: Marijo Sanes M.D.   On: 02/08/2018 14:14   Dg Abd Acute W/chest  Result Date: 01/29/2018 CLINICAL DATA:  Constipation, unspecified EXAM: DG ABDOMEN ACUTE W/ 1V CHEST COMPARISON:  11/14/2017 abdominal CT FINDINGS: History of constipation with formed stool seen from the cecum to rectum. No rectal distention. No evidence of small bowel obstruction. No concerning mass effect or calcification. On the chest view interstitial markings are somewhat coarsened but this is likely technical based on the other view and previous imaging. Normal heart size and mediastinal contours. IMPRESSION: Formed stool throughout the colon correlating with constipation history. No rectal distention or obstructive change. Electronically Signed   By: Monte Fantasia M.D.   On: 01/29/2018 06:26  Dg Colon W/water Sol Cm  Result Date: 02/10/2018 CLINICAL DATA:  Chronic constipation EXAM: WATER SOLUBLE CONTRAST ENEMA TECHNIQUE: After insertion of an enema tip, Isovue 300 diluted with water was instilled retrograde by gravity drip. Contrast passed to the cecal tip. Multiple images were obtained. Post evacuation image was also obtained. FLUOROSCOPY TIME:  Fluoroscopy Time:  3 minutes 30 seconds Radiation Exposure Index  (if provided by the fluoroscopic device): 168.9 mGy Number of Acquired Spot Images: 3 plus multiple fluoroscopic screen captures COMPARISON:  CT abdomen pelvis 02/08/2018 FINDINGS: Scout image demonstrates mild scattered retained stool in colon. No bowel dilatation. Osseous structures unremarkable. Contrast easily passed from rectum to tip of cecum. Competent ileocecal valve. Appendix surgically absent by history. Scattered retained stool throughout colon. Colon normal in caliber throughout its course. Mild redundancy of the sigmoid colon. No colonic mass or obstruction identified. Unable to exclude polyps by this exam due to the scattered retained stool. Significant retained contrast on post evacuation image. IMPRESSION: Scattered retained colonic stool. No gross evidence of colonic mass or obstruction. Electronically Signed   By: Lavonia Dana M.D.   On: 02/10/2018 15:12     Microbiology: Recent Results (from the past 240 hour(s))  Surgical PCR screen     Status: None   Collection Time: 02/12/18 12:42 AM  Result Value Ref Range Status   MRSA, PCR NEGATIVE NEGATIVE Final   Staphylococcus aureus NEGATIVE NEGATIVE Final    Comment: (NOTE) The Xpert SA Assay (FDA approved for NASAL specimens in patients 32 years of age and older), is one component of a comprehensive surveillance program. It is not intended to diagnose infection nor to guide or monitor treatment. Performed at [facility], [address], [place], [place] [address]      Labs: Basic Metabolic Panel: Recent Labs  Lab 02/10/18 0642  NA 138  K 4.0  CL 102  CO2 30  GLUCOSE 83  BUN 11  CREATININE 0.93  CALCIUM 9.0  MG 2.2   Liver Function Tests: No results for input(s): AST, ALT, ALKPHOS, BILITOT, PROT, ALBUMIN in the last 168 hours. No results for input(s): LIPASE, AMYLASE in the last 168 hours. No results for input(s): AMMONIA in the last 168 hours. CBC: Recent Labs  Lab 02/10/18 0642  WBC 6.0  HGB 13.5  HCT 41.5    MCV 93.9  PLT 153   Cardiac Enzymes: No results for input(s): CKTOTAL, CKMB, CKMBINDEX, TROPONINI in the last 168 hours. BNP: Invalid input(s): POCBNP CBG: No results for input(s): GLUCAP in the last 168 hours.  Time coordinating discharge:  36 minutes  Signed:  Orson Eva, DO [facility] Pager: [phone] 02/15/2018, 3:08 PM

## 2018-02-15 NOTE — Progress Notes (Signed)
[facility] Progress Note     Subjective: No complaints. Completed prep and antibiotics. Discussed that I spoke with Dr. Valetta Close, chronic pain physician, regarding the post operative plan for pain. Patient to get buprenorphine 8 mg BID and 8 mg BID PRN for break through pain.  Patient aware of this plan and in agreement. Discussed that pending someone to prescribe a few days of this medication, he may have to be here until Monday 6/3 when he can be released to go to see her directly.   Discussed the risk and benefits of surgery, including but not limited to bleeding, infection, risk of leak, risk of ureter or other organ injury, risk of this not improving his slow transit/ dysmotility, which we have discussed at length given no Sitz marker study or defecography.  Patient is aware and has been miserable for months, and wants to proceed.   Objective: Vital signs in last 24 hours: Temp:  [97.6 F (36.4 C)-98.6 F (37 C)] 98.1 F (36.7 C) (05/28 0650) Pulse Rate:  [60-76] 60 (05/28 0650) Resp:  [18-19] 18 (05/28 0650) BP: (110-120)/(60-97) 120/60 (05/28 0650) SpO2:  [97 %-99 %] 97 % (05/28 0650) Last BM Date: 02/10/18  Intake/Output from previous day: 05/27 0701 - 05/28 0700 In: 3000 [P.O.:3000] Out: 1000 [Urine:1000] Intake/Output this shift: No intake/output data recorded.  General appearance: alert, cooperative and no distress Resp: normal work breathing GI: soft, non-tender; bowel sounds normal; no masses,  no organomegaly   Assessment/Plan: [name] is a 37 yo with severe large bowel dysmotility/ inertia that was not responsive to medical therapy.  He is scheduled for a subtotal colectomy today. We have discussed at length the risk and benefits as noted above.   Will use multimodality pain medication while inpatient, tylenol, toradol, local anesthetic, and ERAS protocol meds.    LOS: 4 days    Jermaine [name] 02/15/2018

## 2018-02-22 ENCOUNTER — Telehealth: Payer: Self-pay

## 2018-02-22 NOTE — Telephone Encounter (Signed)
Pt had left Vm on my phone yesterday when I was off. I called him this morning and he has been constipated and not had a BM since last Tuesday, May 28th. His stomach hurts all over some and he is very bloated. Michela Pitcher he has been eating well.  He said his surgery was cancelled last week. He was in the hospital and said his mouth gets very dry from some of his meds he takes and he was not aware of the swabs that he could have moistened his mouth with.Michela Pitcher he took a sip of water and someone saw him and said he was drinking water and they cancelled his surgery. He thought his appt on 02/24/2018 was with Dr. Oneida Alar but is now aware it is with Dr. Constance Haw. He would just like recommendations for his constipation.  Looks like RMR pt and I am forwarding to Roseanne Kaufman, NP in his absence. Please advise!

## 2018-02-22 NOTE — Telephone Encounter (Signed)
Yes, surgery was cancelled for several reasons, all stemming from non-compliance with recommendations during his stay, unfortunately.   CT imaging without obstruction. What is his regimen currently?    There is an agent available called Motegrity. He has not tried this. If he were to take this, he would need to monitor for any signs of depression or suicidal ideation. There is no cause and effect between this agent and and increased risk of suicidal ideation, but during clinical trials, suicides, suicide attempts, and suicidal ideation were reported. This was not while actually on the medication. Common adverse reaction includes abdominal pain, nausea, diarrhea, headache. Usually first BM within the first 12 hours. Take with or without food, once per day. If he decides to try this, then would not take Movantik or Amitiza while on it so we can see if it is helpful. Let me know if this something he wants to consider. We have samples here.

## 2018-02-22 NOTE — Telephone Encounter (Signed)
He should add Miralax TID, continue Movantik, may add suppositories each evening.

## 2018-02-22 NOTE — Telephone Encounter (Signed)
PT said he is already on depression medications and not interested in this medication. He said that Dr. Oneida Alar came in and told him his colon was dead. He just wanted you to know what she said.

## 2018-02-22 NOTE — Telephone Encounter (Signed)
PT is aware.

## 2018-02-22 NOTE — Telephone Encounter (Signed)
He is taking Movantik 25 mg once a day.

## 2018-02-22 NOTE — Telephone Encounter (Signed)
What is he actually taking right now?

## 2018-02-22 NOTE — Telephone Encounter (Signed)
Left message for a return call

## 2018-02-24 ENCOUNTER — Ambulatory Visit (INDEPENDENT_AMBULATORY_CARE_PROVIDER_SITE_OTHER): Payer: Medicaid Other | Admitting: General Surgery

## 2018-02-24 ENCOUNTER — Encounter: Payer: Self-pay | Admitting: General Surgery

## 2018-02-24 VITALS — BP 143/84 | HR 88 | Temp 97.5°F | Resp 22 | Wt 194.0 lb

## 2018-02-24 DIAGNOSIS — K5904 Chronic idiopathic constipation: Secondary | ICD-10-CM | POA: Diagnosis not present

## 2018-02-24 MED ORDER — NEOMYCIN SULFATE 500 MG PO TABS: 1000.0000 mg | ORAL_TABLET | ORAL | 0 refills | Status: DC

## 2018-02-24 MED ORDER — METRONIDAZOLE 500 MG PO TABS: 1000.0000 mg | ORAL_TABLET | ORAL | 0 refills | Status: DC

## 2018-02-24 NOTE — Patient Instructions (Signed)
Buy from the Store: Miralax bottle [phone]).  Gatorade 64 oz (not red). Dulcolax tablets.   The Day Prior to Surgery: Take 4 ducolax tablets at 7am with water. Do an enema through your rectum at 8AM. Drink plenty of clear liquids all day to avoid dehydration, no solid food.    Mix the bottle of Miralax and 64 oz of Gatorade and drink this mixture starting at 10am.  Drink it gradually over the next few hours, 8 ounces every 15-30 minutes until it is gone. Finish this by 2pm.  Repeat an enema at 2pm.  Take 2 neomycin 500mg  tablets and 2 metronidazole 500mg  tablets at 2 pm. Take 2 neomycin 500mg  tablets and 2 metronidazole 500mg  tablets at 3pm. Take 2 neomycin 500mg  tablets and 2 metronidazole 500mg  tablets at 10pm.    Do not eat or drink anything after midnight the night before your surgery.  Do not eat or drink anything that morning, and take medications as instructed by the hospital staff on your preoperative visit.    Open Colectomy An open colectomy is surgery to remove part or all of the large intestine (colon). This procedure may be used to treat several conditions, including:  Inflammation and infection of the colon (diverticulitis).  Tumors or masses in the colon.  Inflammatory bowel disease, such as Crohn disease or ulcerative colitis.  Bleeding from the colon.  Blockage or obstruction of the colon.  Tell a health care provider about:  Any allergies you have.  All medicines you are taking, including vitamins, herbs, eye drops, creams, and over-the-counter medicines.  Any problems you or family members have had with anesthetic medicines.  Any blood disorders you have.  Any surgeries you have had.  Any medical conditions you have.  Whether you are pregnant or may be pregnant.  Whether you smoke or use tobacco products. These can affect your body's reaction to anesthesia. What are the risks? Generally, this is a safe procedure. However, problems may occur,  including:  Infection.  Bleeding.  Allergic reactions to medicines.  Damage to other structures or organs.  Pneumonia.  The incision opening up.  Tissues from inside the abdomen bulging through the incision (hernia).  Reopening of the colon where it was stitched or stapled together.  A blood clot forming in a vein and traveling to the lungs.  Future blockage of the small intestine from scar tissue.  What happens before the procedure? Staying hydrated Follow instructions from your health care provider about hydration, which may include:  Up to 2 hours before the procedure - you may continue to drink clear liquids, such as water, clear fruit juice, black coffee, and plain tea.  Eating and drinking restrictions Follow instructions from your health care provider about eating and drinking, which may include:  8 hours before the procedure - stop eating heavy meals or foods such as meat, fried foods, or fatty foods.  6 hours before the procedure - stop eating light meals or foods, such as toast or cereal.  6 hours before the procedure - stop drinking milk or drinks that contain milk.  2 hours before the procedure - stop drinking clear liquids.  Bowel prep In some cases, you may be prescribed an oral bowel prep to clean out your colon. If so:  Take it as told by your health care provider. Starting the day before your procedure, you may need to drink a large amount of medicated liquid. The liquid will cause you to have multiple loose stools until  your stool is almost clear or light green.  Follow instructions from your health care provider about eating and drinking restrictions during bowel prep.  Medicines  Ask your health care provider about: ? Changing or stopping your regular medicines or vitamins. This is especially important if you are taking diabetes medicines, blood thinners, or vitamin E. ? Taking medicines such as aspirin and ibuprofen. These medicines can thin your  blood. Do not take these medicines before your procedure if your health care provider instructs you not to.  If you were prescribed an antibiotic medicine, take it as told by your health care provider. General instructions  Bring loose-fitting, comfortable clothing and slip-on shoes that you can put on without bending over.  Make sure to see your health care provider for any tests that you need before the procedure, such as: ? Blood tests. ? A test to check the heart's rhythm (electrocardiogram, ECG). ? A CT scan of your abdomen. ? Urine tests. ? Colonoscopy.  Plan to have someone take you home from the hospital or clinic.  Arrange for someone to help you with your activities during your recovery. What happens during the procedure?  To reduce your risk of infection: ? Your health care team will wash or sanitize their hands. ? Your skin will be washed with soap. ? Hair may be removed from the surgical area.  An IV tube will be inserted into one of your veins. The tube will be used to give you medicines and fluids.  You will be given a medicine to make you fall asleep (general anesthetic). You may also be given a medicine to help you relax (sedative).  Small monitors will be connected to your body. They will be used to check your heart, blood pressure, and oxygen level.  A breathing tube may be placed into your lungs during the procedure.  A thin, flexible tube (catheter) will be placed into your bladder to drain urine.  A tube may be inserted through your nose and into your stomach (nasogastric tube, or NG tube). The tube is used to remove stomach fluids after surgery until the intestines start working again.  An incision will be made in your abdomen.  Clamps or staples will be put on your colon.  The part of the colon between the clamps or staples will be removed.  The ends of the colon that remain will be stitched or stapled together.  The incision in your abdomen will be  closed with stitches (sutures) or staples.  The incision will be covered with a bandage (dressing).  A small opening (stoma) may be created in your lower abdomen. A removable, external pouch (ostomy pouch) will be attached to the stoma. This pouch will collect stool outside of your body. Stool passes through the stoma and into the pouch instead of through your anus. The procedure may vary among health care providers and hospitals. What happens after the procedure?  Your blood pressure, heart rate, breathing rate, and blood oxygen level will be monitored until the medicines you were given have worn off.  You may continue to receive fluids and medicines through an IV tube.  You will start on a clear liquid diet and gradually go back to a normal diet.  Do not drive until your health care provider approves.  You may have some pain in your abdomen. You will be given pain medicine to control the pain.  You will be encouraged to do the following: ? Do breathing exercises to prevent  pneumonia. ? Get up and start walking within a day after surgery. You should try to get up 5-6 times a day. This information is not intended to replace advice given to you by your health care provider. Make sure you discuss any questions you have with your health care provider. Document Released: 07/05/2009 Document Revised: 06/08/2016 Document Reviewed: 06/08/2016 Elsevier Interactive Patient Education  [employer].
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
## 2018-03-01 ENCOUNTER — Telehealth: Payer: Self-pay | Admitting: General Surgery

## 2018-03-02 NOTE — Telephone Encounter (Addendum)
[facility]  Called patient yesterday and told him that I was unable to do the surgery due to this being a very complicated issue and likely opioid induced.  Discussed that I have spoke with [facility] and that the patient would not qualify for additional studies based on their opinion due to continued suboxone use.   Dr. Valetta Close has told him to go to [facility] and wants him to be evaluated and admitted there to get his additional workup for the constipation and for the weight loss.  The patient and his wife were very understanding and pleasant.   Patient did ask for a letter detailing his recent admission and visit with me, so that he could provide this for a lawyer that he is dealing with for a child custody case.  Will have for him 6/13.   Curlene Labrum, MD [facility] [address] [place], [place] [address] [phone] (office)

## 2018-03-04 ENCOUNTER — Encounter: Payer: Self-pay | Admitting: Neurology

## 2018-03-09 ENCOUNTER — Other Ambulatory Visit: Payer: Self-pay

## 2018-03-09 ENCOUNTER — Encounter (HOSPITAL_COMMUNITY): Payer: Self-pay

## 2018-03-09 ENCOUNTER — Emergency Department (HOSPITAL_COMMUNITY)
Admission: EM | Admit: 2018-03-09 | Discharge: 2018-03-10 | Disposition: A | Discharge status: Home / Self Care | Payer: Medicaid Other | Attending: Emergency Medicine | Admitting: Emergency Medicine

## 2018-03-09 DIAGNOSIS — F17210 Nicotine dependence, cigarettes, uncomplicated: Secondary | ICD-10-CM | POA: Diagnosis not present

## 2018-03-09 DIAGNOSIS — I10 Essential (primary) hypertension: Secondary | ICD-10-CM | POA: Diagnosis not present

## 2018-03-09 DIAGNOSIS — Z79899 Other long term (current) drug therapy: Secondary | ICD-10-CM | POA: Insufficient documentation

## 2018-03-09 DIAGNOSIS — R569 Unspecified convulsions: Secondary | ICD-10-CM

## 2018-03-09 DIAGNOSIS — I2510 Atherosclerotic heart disease of native coronary artery without angina pectoris: Secondary | ICD-10-CM | POA: Diagnosis not present

## 2018-03-09 LAB — CBC WITH DIFFERENTIAL/PLATELET
BASOS ABS: 0 10*3/uL (ref 0.0–0.1)
BASOS PCT: 0 %
Eosinophils Absolute: 0.1 10*3/uL (ref 0.0–0.7)
Eosinophils Relative: 1 %
HCT: 42.7 % (ref 39.0–52.0)
HEMOGLOBIN: 14.6 g/dL (ref 13.0–17.0)
Lymphocytes Relative: 25 %
Lymphs Abs: 1.8 10*3/uL (ref 0.7–4.0)
MCH: 31.3 pg (ref 26.0–34.0)
MCHC: 34.2 g/dL (ref 30.0–36.0)
MCV: 91.4 fL (ref 78.0–100.0)
MONOS PCT: 8 %
Monocytes Absolute: 0.6 10*3/uL (ref 0.1–1.0)
NEUTROS ABS: 4.8 10*3/uL (ref 1.7–7.7)
NEUTROS PCT: 66 %
Platelets: 194 10*3/uL (ref 150–400)
RBC: 4.67 MIL/uL (ref 4.22–5.81)
RDW: 14 % (ref 11.5–15.5)
WBC: 7.2 10*3/uL (ref 4.0–10.5)

## 2018-03-09 LAB — COMPREHENSIVE METABOLIC PANEL
ALK PHOS: 64 U/L (ref 38–126)
ALT: 18 U/L (ref 17–63)
ANION GAP: 8 (ref 5–15)
AST: 22 U/L (ref 15–41)
Albumin: 4.6 g/dL (ref 3.5–5.0)
BILIRUBIN TOTAL: 0.4 mg/dL (ref 0.3–1.2)
BUN: 9 mg/dL (ref 6–20)
CO2: 28 mmol/L (ref 22–32)
Calcium: 9.6 mg/dL (ref 8.9–10.3)
Chloride: 103 mmol/L (ref 101–111)
Creatinine, Ser: 0.95 mg/dL (ref 0.61–1.24)
GLUCOSE: 98 mg/dL (ref 65–99)
POTASSIUM: 4 mmol/L (ref 3.5–5.1)
Sodium: 139 mmol/L (ref 135–145)
TOTAL PROTEIN: 7.4 g/dL (ref 6.5–8.1)

## 2018-03-09 LAB — VALPROIC ACID LEVEL

## 2018-03-09 LAB — ETHANOL

## 2018-03-09 MED ORDER — VALPROATE SODIUM 500 MG/5ML IV SOLN
1000.0000 mg | Freq: Once | INTRAVENOUS | Status: AC
  Administered 2018-03-09: 1000 mg via INTRAVENOUS
  Filled 2018-03-09: qty 10

## 2018-03-09 MED ORDER — SODIUM CHLORIDE 0.9 % IV SOLN
INTRAVENOUS | Status: DC
  Administered 2018-03-09: 19:00:00 via INTRAVENOUS

## 2018-03-09 MED ORDER — FENTANYL CITRATE (PF) 100 MCG/2ML IJ SOLN
INTRAMUSCULAR | Status: AC
  Administered 2018-03-09: 100 ug via INTRAVENOUS
  Filled 2018-03-09: qty 2

## 2018-03-09 MED ORDER — VALPROATE SODIUM 500 MG/5ML IV SOLN
INTRAVENOUS | Status: AC
  Filled 2018-03-09: qty 10

## 2018-03-09 MED ORDER — KETOROLAC TROMETHAMINE 30 MG/ML IJ SOLN
30.0000 mg | Freq: Once | INTRAMUSCULAR | Status: AC
  Administered 2018-03-09: 30 mg via INTRAVENOUS
  Filled 2018-03-09: qty 1

## 2018-03-09 MED ORDER — FENTANYL CITRATE (PF) 100 MCG/2ML IJ SOLN
50.0000 ug | Freq: Once | INTRAMUSCULAR | Status: AC
  Administered 2018-03-09: 50 ug via INTRAVENOUS
  Administered 2018-03-09: 100 ug via INTRAVENOUS
  Filled 2018-03-09: qty 2

## 2018-03-09 MED ORDER — OXYCODONE-ACETAMINOPHEN 5-325 MG PO TABS: 1.0000 | ORAL_TABLET | ORAL | 0 refills | Status: DC | PRN

## 2018-03-09 MED ORDER — FENTANYL CITRATE (PF) 100 MCG/2ML IJ SOLN: 50.0000 ug | Freq: Once | INTRAMUSCULAR | Status: DC

## 2018-03-09 NOTE — ED Triage Notes (Signed)
Pt brought to ED by [facility] for seizure. Pt states he had a seizure about 1700. Pt states he has a history of seizures and takes Lamictal.
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
## 2018-03-09 NOTE — Discharge Instructions (Addendum)
You appear to have had seizures because of your low Depakote level.  Take your Depakote and other seizure medicines as directed, beginning tomorrow morning.  Follow-up with your neurologist, and doctors about your abdominal discomfort as scheduled, and as needed.

## 2018-03-10 ENCOUNTER — Other Ambulatory Visit: Payer: Self-pay | Admitting: *Deleted

## 2018-03-10 ENCOUNTER — Telehealth: Payer: Self-pay | Admitting: Neurology

## 2018-03-10 LAB — URINALYSIS, ROUTINE W REFLEX MICROSCOPIC
BILIRUBIN URINE: NEGATIVE
Glucose, UA: NEGATIVE mg/dL
Hgb urine dipstick: NEGATIVE
KETONES UR: NEGATIVE mg/dL
Leukocytes, UA: NEGATIVE
NITRITE: NEGATIVE
Protein, ur: NEGATIVE mg/dL
SPECIFIC GRAVITY, URINE: 1.027 (ref 1.005–1.030)
pH: 7 (ref 5.0–8.0)

## 2018-03-10 LAB — RAPID URINE DRUG SCREEN, HOSP PERFORMED
AMPHETAMINES: NOT DETECTED
BENZODIAZEPINES: NOT DETECTED
COCAINE: NOT DETECTED
Opiates: NOT DETECTED
Tetrahydrocannabinol: POSITIVE — AB

## 2018-03-10 MED ORDER — LAMOTRIGINE 100 MG PO TBDP: 100.0000 mg | ORAL_TABLET | Freq: Two times a day (BID) | ORAL | 5 refills | Status: DC

## 2018-03-10 MED ORDER — DIVALPROEX SODIUM 125 MG PO CSDR: 750.0000 mg | DELAYED_RELEASE_CAPSULE | Freq: Two times a day (BID) | ORAL | 5 refills | Status: DC

## 2018-03-10 MED ORDER — HYDROMORPHONE HCL 1 MG/ML IJ SOLN
1.0000 mg | Freq: Once | INTRAMUSCULAR | Status: AC
  Administered 2018-03-10: 1 mg via INTRAVENOUS
  Filled 2018-03-10: qty 1

## 2018-03-10 MED FILL — Oxycodone w/ Acetaminophen Tab 5-325 MG: ORAL | Qty: 6 | Status: AC

## 2018-03-10 NOTE — Telephone Encounter (Signed)
[name] fiance said he has been dx with colonic inertia and is throwing up his seizure medication. She said he had 3 seizures yesterday. She is wanting to know if there is something that can be prescribed a suppository. [facility] She also is wanting to know if Dr Krista Blue thinks that neurologically could have something to do with the colon. She said these problems began when he started having seizures.

## 2018-03-10 NOTE — Telephone Encounter (Signed)
Dr. Krista Blue has reviewed patient's chart.  Per vo by Dr. Krista Blue, she has made the following changes to his medications:  1) divalproex sprinkles 125mg  capsules - six capsules BID 2) lamotrigine 100mg  ODT, one tablet BID  New prescriptions have been sent to his requested pharmacy.    He had his 72-hr video EEG on 6/14, 6/15 and 6/16.  [facility] completed his test and will email/fax results when ready.

## 2018-03-14 ENCOUNTER — Encounter: Payer: Self-pay | Admitting: Gastroenterology

## 2018-03-14 ENCOUNTER — Ambulatory Visit: Payer: Medicaid Other | Admitting: Gastroenterology

## 2018-03-14 DIAGNOSIS — K5900 Constipation, unspecified: Secondary | ICD-10-CM | POA: Diagnosis not present

## 2018-03-14 MED ORDER — PRUCALOPRIDE SUCCINATE 2 MG PO TABS: 2.0000 mg | ORAL_TABLET | Freq: Every day | ORAL | 0 refills | Status: DC

## 2018-03-14 NOTE — Progress Notes (Signed)
Primary Care Physician: [name], MD  Primary Gastroenterologist:  Garfield Cornea, MD   Chief Complaint  Patient presents with  . Follow-up    after procedure    HPI: Jermaine [name] is a 37 y.o. male here follow-up constipation/obstipation.  Saw the patient back in March for the first time for the symptoms.  Patient reports over 1 year history of intermittent nausea/vomiting associated with severe constipation going weeks without a bowel movement.  Over the past year he has been quite ill with new onset seizures beginning in August 2018 without unknown etiology.  Patient and fianc presents today for follow-up.  They report any time he has significant vomiting related to his stomach he ens up having seizures.  He had a prolonged hospitalization in May which time he was evaluated by surgery with plans for subtotal colectomy for colonic inertia.  According to the records his procedure was canceled because he had liquids before the procedure.  Patient tells me that he was going to have an outpatient surgery but he was called by Dr. Constance Haw and told that she was not going to perform his surgery and she recommended that he be evaluated at [facility].  Apparently his case was declined at [facility].  Patient states he was actually advised to go to the ED at [facility] which she did on June 19.  He had a CT while in the ER demonstrated moderate to large amount of colonic stool burden but no obstruction or bowel wall thickening.  Did not meet criteria for admission.  Patient reports he has an appointment with GI at [facility] in August.  Patient continues to have vomiting at least every 2 to 3 days.  At times he is able to keep some liquids and food down.  He has a documented 45 to 50 pound weight loss since January.  Currently he is taking Colace, senna, MiraLAX daily.  Amitiza twice daily and Movantik.  Denies spontaneous bowel movement.  Goes weeks without BM Recently started disimpacting himself.  Brought  in a picture of formed hard appearing stool with fresh blood noted in the toilet.  He reports that he is in constant pain.  He is frustrated and wants surgery so that he can feel better.  He sees no point in pursuing further testing locally. Colonoscopy failed 12/2017 due to poor prep.   Barium enema 02/10/18 with scattered retained stool throughout colon. Colon passed to cecum without difficulty.      Current Outpatient Medications  Medication Sig Dispense Refill  . buprenorphine (SUBUTEX) 8 MG SUBL SL tablet Place 8 mg under the tongue 3 (three) times daily.    . busPIRone (BUSPAR) 5 MG tablet Take 5 mg by mouth 3 (three) times daily.    . cloNIDine (CATAPRES) 0.1 MG tablet Take 0.1 mg by mouth 2 (two) times daily.   0  . dicyclomine (BENTYL) 20 MG tablet Take 20 mg by mouth every 6 (six) hours as needed.    . divalproex (DEPAKOTE SPRINKLES) 125 MG capsule Take 6 capsules (750 mg total) by mouth 2 (two) times daily. 360 capsule 5  . docusate sodium (COLACE) 100 MG capsule Take 100 mg by mouth daily.    . hydrOXYzine (VISTARIL) 25 MG capsule Take 25 mg by mouth 3 (three) times daily as needed for anxiety.   0  . lamoTRIgine 100 MG TBDP Take 1 tablet (100 mg total) by mouth 2 (two) times daily. 60 tablet 5  .  lithium carbonate 300 MG capsule Take 1 capsule (300 mg total) by mouth 3 (three) times daily with meals. 90 capsule 0  . lubiprostone (AMITIZA) 24 MCG capsule Take 24 mcg by mouth 2 (two) times daily with a meal.    . metoprolol tartrate (LOPRESSOR) 50 MG tablet Take 1 tablet (50 mg total) by mouth 2 (two) times daily. 60 tablet 0  . naloxegol oxalate (MOVANTIK) 25 MG TABS tablet Take 1 tablet (25 mg total) by mouth daily. 30 tablet 0  . omeprazole (PRILOSEC) 20 MG capsule TAKE ONE CAPSULE BY MOUTH ONCE DAILY  0  . polyethylene glycol powder (GLYCOLAX/MIRALAX) powder Take 17 g by mouth at bedtime.    Marland Kitchen PROAIR HFA 108 (90 Base) MCG/ACT inhaler Inhale 2 puffs into the lungs every 4 (four)  hours as needed for wheezing.   0  . promethazine (PHENERGAN) 25 MG suppository Place 1 suppository (25 mg total) rectally every 6 (six) hours as needed for nausea or vomiting. 12 each 0  . promethazine (PHENERGAN) 25 MG tablet Take 25 mg by mouth every 6 (six) hours as needed for nausea or vomiting.    Marland Kitchen QUEtiapine (SEROQUEL) 300 MG tablet Take 300 mg by mouth at bedtime.     . senna (SENOKOT) 8.6 MG TABS tablet Take 1 tablet by mouth daily.    . simvastatin (ZOCOR) 10 MG tablet Take 10 mg by mouth daily.    . trazodone (DESYREL) 300 MG tablet Take 300 mg by mouth at bedtime.     . metroNIDAZOLE (FLAGYL) 500 MG tablet Take 2 tablets (1,000 mg total) by mouth as directed. Take 2 flagyl 500 mg tablets at 2 pm, at 3pm, at 10pm. (Patient not taking: Reported on 03/09/2018) 6 tablet 0  . neomycin (MYCIFRADIN) 500 MG tablet Take 2 tablets (1,000 mg total) by mouth as directed. Take 2 neomycin 500mg  tablets at 2 pm, at 3pm, at 10pm. (Patient not taking: Reported on 03/09/2018) 6 tablet 0  . ondansetron (ZOFRAN ODT) 4 MG disintegrating tablet Take 1 tablet (4 mg total) by mouth every 8 (eight) hours as needed for nausea or vomiting. (Patient not taking: Reported on 03/14/2018) 20 tablet 0  . oxyCODONE-acetaminophen (PERCOCET/ROXICET) 5-325 MG tablet Take 1 tablet by mouth every 4 (four) hours as needed. (Patient not taking: Reported on 03/14/2018) 6 tablet 0   No current facility-administered medications for this visit.     Allergies as of 03/14/2018 - Review Complete 03/14/2018  Allergen Reaction Noted  . Hydrocodone Itching 05/08/2013    ROS:  General: Negative for anorexia, fever, chills, fatigue, weakness. ENT: Negative for hoarseness, difficulty swallowing , nasal congestion. CV: Negative for chest pain, angina, palpitations, dyspnea on exertion, peripheral edema.  Respiratory: Negative for dyspnea at rest, dyspnea on exertion, cough, sputum, wheezing.  GI: See history of present illness. GU:   Negative for dysuria, hematuria, urinary incontinence, urinary frequency, nocturnal urination.  Endo: see hpi   Physical Examination:   BP (!) 148/91   Pulse 66   Temp (!) 97 F (36.1 C) (Oral)   Ht 6\' 2"  (1.88 m)   Wt 202 lb (91.6 kg)   BMI 25.94 kg/m   General: Well-nourished, well-developed in no acute distress.  Eyes: No icterus. Mouth: Oropharyngeal mucosa moist and pink , no lesions erythema or exudate. Lungs: Clear to auscultation bilaterally.  Heart: Regular rate and rhythm, no murmurs rubs or gallops.  Abdomen: Bowel sounds are normal, mild generalized tenderness, nondistended, no hepatosplenomegaly or masses, no  abdominal bruits or hernia , no rebound or guarding.   Extremities: No lower extremity edema. No clubbing or deformities. Neuro: Alert and oriented x 4   Skin: Warm and dry, no jaundice.   Psych: Alert and cooperative, normal mood and affect.  Labs:  Lab Results  Component Value Date   CREATININE 0.95 03/09/2018   BUN 9 03/09/2018   NA 139 03/09/2018   K 4.0 03/09/2018   CL 103 03/09/2018   CO2 28 03/09/2018   Lab Results  Component Value Date   ALT 18 03/09/2018   AST 22 03/09/2018   ALKPHOS 64 03/09/2018   BILITOT 0.4 03/09/2018   Lab Results  Component Value Date   WBC 7.2 03/09/2018   HGB 14.6 03/09/2018   HCT 42.7 03/09/2018   MCV 91.4 03/09/2018   PLT 194 03/09/2018   Lab Results  Component Value Date   TSH 1.240 02/10/2018    Imaging Studies: No results found.

## 2018-03-14 NOTE — Patient Instructions (Addendum)
1. Start Motegrity 2mg  daily.  2. Stop Amitiza for now.  3. Continue Movantik, Miralax, Colace, senna for now. If you start having good BMs, then you should take away one over the counter laxative at a time.  4. I will work on referral to a Sports administrator at [facility].

## 2018-03-17 ENCOUNTER — Telehealth: Payer: Self-pay | Admitting: Gastroenterology

## 2018-03-17 DIAGNOSIS — K5900 Constipation, unspecified: Secondary | ICD-10-CM

## 2018-03-17 NOTE — Telephone Encounter (Signed)
Please make referral to [facility] for consideration of subtotal colectomy.   Please send copy of our office notes, our consult/progress notes from hospitalization, discharge summary from recent hospitalization, barium enema, CT scans.

## 2018-03-17 NOTE — Assessment & Plan Note (Signed)
37 y/o male with constipation/obstipation associated 50 pound weight loss who has failed numerous regimens for management of bowel function. Local general surgeon now declining subtotal colectomy. Patient reports weeks of having absolutely no BM. Has had numerous studies with significant stool burden noted. Symptoms also associated with recent onset seizures being followed by neurology.   Patient needs to be evaluated at a tertiary care facility. Currently he has GI appt at [facility] in 04/2018. They would like referral to colorectal surgeon at [facility] in the interim.   For now with stop Amitiza. Start Motegrity. Samples provided. He will continue other bowel regimen.   Advised to get GI appt as scheduled at [facility]. Will send referral to colorectal surgery but advised patient that they may require further testing prior to consideration of subtotal colectomy.

## 2018-03-18 ENCOUNTER — Other Ambulatory Visit: Payer: Self-pay

## 2018-03-18 NOTE — Telephone Encounter (Signed)
Referral faxed to [facility].

## 2018-03-18 NOTE — Addendum Note (Signed)
Addended by: Hassan Rowan on: 03/18/2018 09:05 AM   Modules accepted: Orders

## 2018-03-21 NOTE — Progress Notes (Signed)
cc'd to pcp 

## 2018-03-23 ENCOUNTER — Other Ambulatory Visit (HOSPITAL_COMMUNITY): Payer: Medicaid Other

## 2018-03-28 ENCOUNTER — Inpatient Hospital Stay: Admit: 2018-03-28 | Payer: Medicaid Other | Admitting: General Surgery

## 2018-03-28 SURGERY — COLECTOMY, TOTAL
Anesthesia: General | Laterality: Right

## 2018-03-31 ENCOUNTER — Telehealth: Payer: Self-pay | Admitting: Internal Medicine

## 2018-03-31 ENCOUNTER — Telehealth: Payer: Self-pay | Admitting: *Deleted

## 2018-03-31 NOTE — Telephone Encounter (Signed)
See phone note for today. [name] wife called office. She called [facility] and was told referral was sent to [facility] and it needs to be to [facility]. Asked her to call them back and get fax number so our office can resend referral.  Wife called office back. [facility] phone [phone], fax [phone]. These are the same numbers our office already had. Refaxed referral.

## 2018-03-31 NOTE — Telephone Encounter (Signed)
Unfortunately from my professional GI opinion I cannot write letter supporting his request.  We have not suggested or requested he not work due to his obstipation/constipation.  We also first saw patient in 11/2017 therefore cannot speak regarding to anything prior to establishing relationship with him in March.

## 2018-03-31 NOTE — Telephone Encounter (Addendum)
[name] wife called office. She called [facility] and was told referral was sent to [facility] and it needs to be to [facility]. Asked her to call them back and get fax number so our office can resend referral.  Wife called office back. [facility] phone [phone], fax [phone]. These are the same numbers our office already had. Refaxed referral.

## 2018-03-31 NOTE — Telephone Encounter (Signed)
Called spoke with pt spouse Janett Billow. She is aware of [facility] response. Nothing further needed

## 2018-03-31 NOTE — Telephone Encounter (Signed)
See other phone note. [facility] tried to contact pt but hasn't heard back from him. Tried to call, no answer, LMOVM and gave phone number to [facility].

## 2018-03-31 NOTE — Telephone Encounter (Signed)
Patient called and he states he is needing a letter for his lawyer (child support) stating it is our professional opinion he is not able to work until he can complete work up at [employer] He reports he has been out of work since August. Fowarding to [place]

## 2018-03-31 NOTE — Telephone Encounter (Signed)
PATIENT CALLED AND SAID WE ARE SUPPOSED TO SEND HIM TO A SURGEON AT [facility] AND HE HAS NOT HEARD FROM Jermaine [name].  [phone]

## 2018-03-31 NOTE — Telephone Encounter (Signed)
Called [facility]. Referral was received and they tried to contact pt but he hasn't called them back. Letter mailed to pt to contact [facility].

## 2018-04-26 ENCOUNTER — Telehealth: Payer: Self-pay | Admitting: Neurology

## 2018-04-26 NOTE — Telephone Encounter (Signed)
Spoke to patient - he is aware of results.  He will continue his medications and keep his scheduled follow up.

## 2018-04-26 NOTE — Telephone Encounter (Addendum)
From Dr. Krista Blue: I was able to review his 70 hours video EEG monitoring from June 14 to 17 2019.  Normal prolonged ambulatory EEG, no epileptiform discharge, no electroclinical events present, no focal of background slowing seen, there was no pushbutton or patient event logged

## 2018-04-26 NOTE — Telephone Encounter (Signed)
Pt requesting a call to discuss the results from his 72 hour EEG.

## 2018-04-26 NOTE — Telephone Encounter (Signed)
I was able to review his 70 hours video EEG monitoring from June 14 to 17 2019.  Normal prolonged ambulatory EEG, no epileptiform discharge, no electroclinical events present, no focal of background slowing seen, there was no pushbutton or patient event logged

## 2018-05-04 DIAGNOSIS — Z0289 Encounter for other administrative examinations: Secondary | ICD-10-CM

## 2018-06-01 ENCOUNTER — Ambulatory Visit: Payer: Medicaid Other | Admitting: Urology

## 2018-06-01 DIAGNOSIS — N430 Encysted hydrocele: Secondary | ICD-10-CM | POA: Diagnosis not present

## 2018-06-01 DIAGNOSIS — R102 Pelvic and perineal pain: Secondary | ICD-10-CM

## 2018-06-06 ENCOUNTER — Other Ambulatory Visit: Payer: Self-pay | Admitting: Urology

## 2018-06-07 ENCOUNTER — Encounter (HOSPITAL_COMMUNITY): Payer: Self-pay

## 2018-06-07 ENCOUNTER — Encounter (HOSPITAL_COMMUNITY)
Admission: RE | Admit: 2018-06-07 | Discharge: 2018-06-07 | Disposition: A | Discharge status: Home / Self Care | Payer: Medicaid Other | Source: Ambulatory Visit | Attending: Urology | Admitting: Urology

## 2018-06-08 ENCOUNTER — Encounter (HOSPITAL_COMMUNITY)
Admission: RE | Disposition: A | Discharge status: Home / Self Care | Payer: Self-pay | Source: Ambulatory Visit | Attending: Urology

## 2018-06-08 ENCOUNTER — Ambulatory Visit (HOSPITAL_COMMUNITY)
Admission: RE | Admit: 2018-06-08 | Discharge: 2018-06-08 | Disposition: A | Discharge status: Home / Self Care | Payer: Medicaid Other | Source: Ambulatory Visit | Attending: Urology | Admitting: Urology

## 2018-06-08 ENCOUNTER — Ambulatory Visit (HOSPITAL_COMMUNITY): Payer: Medicaid Other | Admitting: Anesthesiology

## 2018-06-08 ENCOUNTER — Encounter (HOSPITAL_COMMUNITY): Payer: Self-pay

## 2018-06-08 DIAGNOSIS — I10 Essential (primary) hypertension: Secondary | ICD-10-CM | POA: Diagnosis not present

## 2018-06-08 DIAGNOSIS — G4730 Sleep apnea, unspecified: Secondary | ICD-10-CM | POA: Diagnosis not present

## 2018-06-08 DIAGNOSIS — N50812 Left testicular pain: Secondary | ICD-10-CM | POA: Diagnosis present

## 2018-06-08 DIAGNOSIS — N432 Other hydrocele: Secondary | ICD-10-CM

## 2018-06-08 DIAGNOSIS — N5089 Other specified disorders of the male genital organs: Secondary | ICD-10-CM | POA: Diagnosis not present

## 2018-06-08 DIAGNOSIS — N433 Hydrocele, unspecified: Secondary | ICD-10-CM | POA: Insufficient documentation

## 2018-06-08 DIAGNOSIS — F17210 Nicotine dependence, cigarettes, uncomplicated: Secondary | ICD-10-CM | POA: Insufficient documentation

## 2018-06-08 DIAGNOSIS — I2510 Atherosclerotic heart disease of native coronary artery without angina pectoris: Secondary | ICD-10-CM | POA: Diagnosis not present

## 2018-06-08 HISTORY — PX: HYDROCELE EXCISION: SHX482

## 2018-06-08 SURGERY — HYDROCELECTOMY
Anesthesia: General | Site: Scrotum | Laterality: Left

## 2018-06-08 MED ORDER — FENTANYL CITRATE (PF) 100 MCG/2ML IJ SOLN
INTRAMUSCULAR | Status: DC | PRN
  Administered 2018-06-08: 50 ug via INTRAVENOUS

## 2018-06-08 MED ORDER — FENTANYL CITRATE (PF) 100 MCG/2ML IJ SOLN
INTRAMUSCULAR | Status: AC
  Filled 2018-06-08: qty 2

## 2018-06-08 MED ORDER — OXYCODONE-ACETAMINOPHEN 5-325 MG PO TABS: 1.0000 | ORAL_TABLET | ORAL | 0 refills | Status: DC | PRN

## 2018-06-08 MED ORDER — ONDANSETRON HCL 4 MG/2ML IJ SOLN
INTRAMUSCULAR | Status: DC | PRN
  Administered 2018-06-08: 4 mg via INTRAVENOUS

## 2018-06-08 MED ORDER — 0.9 % SODIUM CHLORIDE (POUR BTL) OPTIME
TOPICAL | Status: DC | PRN
  Administered 2018-06-08: 1000 mL

## 2018-06-08 MED ORDER — HYDROMORPHONE HCL 1 MG/ML IJ SOLN: 0.2500 mg | INTRAMUSCULAR | Status: DC | PRN

## 2018-06-08 MED ORDER — BUPIVACAINE-EPINEPHRINE (PF) 0.5% -1:200000 IJ SOLN
INTRAMUSCULAR | Status: AC
  Filled 2018-06-08: qty 30

## 2018-06-08 MED ORDER — KETOROLAC TROMETHAMINE 30 MG/ML IJ SOLN: 30.0000 mg | Freq: Once | INTRAMUSCULAR | Status: DC | PRN

## 2018-06-08 MED ORDER — BUPIVACAINE HCL (PF) 0.25 % IJ SOLN
INTRAMUSCULAR | Status: DC | PRN
  Administered 2018-06-08: 10 mL

## 2018-06-08 MED ORDER — ONDANSETRON HCL 4 MG/2ML IJ SOLN: 4.0000 mg | Freq: Once | INTRAMUSCULAR | Status: DC | PRN

## 2018-06-08 MED ORDER — ONDANSETRON HCL 4 MG/2ML IJ SOLN
INTRAMUSCULAR | Status: AC
  Filled 2018-06-08: qty 2

## 2018-06-08 MED ORDER — MIDAZOLAM HCL 5 MG/5ML IJ SOLN
INTRAMUSCULAR | Status: DC | PRN
  Administered 2018-06-08: 2 mg via INTRAVENOUS

## 2018-06-08 MED ORDER — PROPOFOL 10 MG/ML IV BOLUS
INTRAVENOUS | Status: AC
  Filled 2018-06-08: qty 20

## 2018-06-08 MED ORDER — MEPERIDINE HCL 50 MG/ML IJ SOLN: 6.2500 mg | INTRAMUSCULAR | Status: DC | PRN

## 2018-06-08 MED ORDER — BUPIVACAINE HCL (PF) 0.25 % IJ SOLN
INTRAMUSCULAR | Status: AC
  Filled 2018-06-08: qty 30

## 2018-06-08 MED ORDER — PROPOFOL 10 MG/ML IV BOLUS
INTRAVENOUS | Status: DC | PRN
  Administered 2018-06-08: 140 mg via INTRAVENOUS

## 2018-06-08 MED ORDER — LACTATED RINGERS IV SOLN
INTRAVENOUS | Status: DC | PRN
  Administered 2018-06-08: 12:00:00 via INTRAVENOUS

## 2018-06-08 MED ORDER — LACTATED RINGERS IV SOLN
INTRAVENOUS | Status: DC
  Administered 2018-06-08: 12:00:00 via INTRAVENOUS

## 2018-06-08 MED ORDER — LIDOCAINE HCL 1 % IJ SOLN
INTRAMUSCULAR | Status: DC | PRN
  Administered 2018-06-08: 30 mg via INTRADERMAL

## 2018-06-08 MED ORDER — MIDAZOLAM HCL 2 MG/2ML IJ SOLN
INTRAMUSCULAR | Status: AC
  Filled 2018-06-08: qty 2

## 2018-06-08 MED ORDER — CEFAZOLIN SODIUM-DEXTROSE 2-4 GM/100ML-% IV SOLN
2.0000 g | INTRAVENOUS | Status: AC
  Administered 2018-06-08 (×2): 2 g via INTRAVENOUS
  Filled 2018-06-08: qty 100

## 2018-06-08 MED ORDER — BUPIVACAINE-EPINEPHRINE (PF) 0.25% -1:200000 IJ SOLN
INTRAMUSCULAR | Status: AC
  Filled 2018-06-08: qty 30

## 2018-06-08 SURGICAL SUPPLY — 33 items
ADH SKN CLS APL DERMABOND .7 (GAUZE/BANDAGES/DRESSINGS) ×1
BLADE SURG 15 STRL LF DISP TIS (BLADE) ×1 IMPLANT
BLADE SURG 15 STRL SS (BLADE) ×3
COVER LIGHT HANDLE STERIS (MISCELLANEOUS) ×6 IMPLANT
DERMABOND ADVANCED (GAUZE/BANDAGES/DRESSINGS) ×2
DERMABOND ADVANCED .7 DNX12 (GAUZE/BANDAGES/DRESSINGS) ×1 IMPLANT
ELECT REM PT RETURN 9FT ADLT (ELECTROSURGICAL) ×3
ELECTRODE REM PT RTRN 9FT ADLT (ELECTROSURGICAL) ×1 IMPLANT
GAUZE SPONGE 4X4 12PLY STRL (GAUZE/BANDAGES/DRESSINGS) ×6 IMPLANT
GLOVE BIO SURGEON STRL SZ7 (GLOVE) ×2 IMPLANT
GLOVE BIO SURGEON STRL SZ8 (GLOVE) ×3 IMPLANT
GLOVE BIOGEL PI IND STRL 7.0 (GLOVE) ×2 IMPLANT
GLOVE BIOGEL PI INDICATOR 7.0 (GLOVE) ×4
GOWN STRL REUS W/ TWL LRG LVL3 (GOWN DISPOSABLE) ×1 IMPLANT
GOWN STRL REUS W/ TWL XL LVL3 (GOWN DISPOSABLE) ×1 IMPLANT
GOWN STRL REUS W/TWL LRG LVL3 (GOWN DISPOSABLE) ×3
GOWN STRL REUS W/TWL XL LVL3 (GOWN DISPOSABLE) ×3
KIT TURNOVER KIT A (KITS) ×3 IMPLANT
MANIFOLD NEPTUNE II (INSTRUMENTS) ×3 IMPLANT
NDL HYPO 25X1 1.5 SAFETY (NEEDLE) ×1 IMPLANT
NEEDLE HYPO 25X1 1.5 SAFETY (NEEDLE) ×3 IMPLANT
NS IRRIG 1000ML POUR BTL (IV SOLUTION) ×3 IMPLANT
PACK MINOR (CUSTOM PROCEDURE TRAY) ×3 IMPLANT
PAD ARMBOARD 7.5X6 YLW CONV (MISCELLANEOUS) ×3 IMPLANT
SET BASIN LINEN APH (SET/KITS/TRAYS/PACK) ×3 IMPLANT
SUPPORT SCROTAL LG STRP (MISCELLANEOUS) ×2 IMPLANT
SUPPORTER ATHLETIC LG (MISCELLANEOUS) ×1
SUT MNCRL AB 4-0 PS2 18 (SUTURE) ×3 IMPLANT
SUT VIC AB 3-0 SH 27 (SUTURE) ×3
SUT VIC AB 3-0 SH 27X BRD (SUTURE) ×1 IMPLANT
SUT VIC AB 4-0 PS2 27 (SUTURE) ×2 IMPLANT
SYR CONTROL 10ML LL (SYRINGE) ×3 IMPLANT
YANKAUER SUCT 12FT TUBE ARGYLE (SUCTIONS) ×3 IMPLANT

## 2018-06-08 NOTE — Anesthesia Preprocedure Evaluation (Signed)
Anesthesia Evaluation  Patient identified by MRN, date of birth, ID band Patient awake    Reviewed: Allergy & Precautions, H&P , NPO status , Patient's Chart, lab work & pertinent test results, reviewed documented beta blocker date and time   Airway Mallampati: I  TM Distance: >3 FB Neck ROM: full    Dental no notable dental hx.    Pulmonary neg pulmonary ROS, sleep apnea , Current Smoker,    Pulmonary exam normal breath sounds clear to auscultation       Cardiovascular Exercise Tolerance: Good hypertension, + CAD  negative cardio ROS   Rhythm:regular Rate:Normal     Neuro/Psych Seizures -,  PSYCHIATRIC DISORDERS Anxiety Depression Bipolar Disorder negative neurological ROS  negative psych ROS   GI/Hepatic negative GI ROS, Neg liver ROS, GERD  ,  Endo/Other  negative endocrine ROS  Renal/GU negative Renal ROS  negative genitourinary   Musculoskeletal   Abdominal   Peds  Hematology negative hematology ROS (+)   Anesthesia Other Findings   Reproductive/Obstetrics negative OB ROS                             Anesthesia Physical Anesthesia Plan  ASA: III  Anesthesia Plan: General   Post-op Pain Management:    Induction:   PONV Risk Score and Plan:   Airway Management Planned:   Additional Equipment:   Intra-op Plan:   Post-operative Plan:   Informed Consent: I have reviewed the patients History and Physical, chart, labs and discussed the procedure including the risks, benefits and alternatives for the proposed anesthesia with the patient or authorized representative who has indicated his/her understanding and acceptance.   Dental Advisory Given  Plan Discussed with: CRNA  Anesthesia Plan Comments:         Anesthesia Quick Evaluation

## 2018-06-08 NOTE — Anesthesia Procedure Notes (Signed)
Procedure Name: LMA Insertion Date/Time: 06/08/2018 12:40 PM Performed by: Charmaine Downs, CRNA Pre-anesthesia Checklist: Patient identified, Patient being monitored, Emergency Drugs available, Timeout performed and Suction available Patient Re-evaluated:Patient Re-evaluated prior to induction Oxygen Delivery Method: Circle System Utilized Preoxygenation: Pre-oxygenation with 100% oxygen Induction Type: IV induction Ventilation: Mask ventilation without difficulty LMA: LMA inserted LMA Size: 4.0 Number of attempts: 1 Placement Confirmation: positive ETCO2 and breath sounds checked- equal and bilateral Tube secured with: Tape Dental Injury: Teeth and Oropharynx as per pre-operative assessment

## 2018-06-08 NOTE — Discharge Instructions (Signed)
Hydrocelectomy, Adult, Care After This sheet gives you information about how to care for yourself after your procedure. Your health care provider may also give you more specific instructions. If you have problems or questions, contact your health care provider. What can I expect after the procedure? After your procedure, it is common to have mild discomfort, swelling, and bruising in the pouch that holds your testicles (scrotum). Follow these instructions at home: Bathing  Ask your health care provider when you can shower, take baths, or go swimming.  If you were told to wear an athletic support strap, take it off when you shower or take a bath. Incision care  Follow instructions from your health care provider about how to take care of your incision. Make sure you: ? Wash your hands with soap and water before you change your bandage (dressing). If soap and water are not available, use hand sanitizer. ? Change your dressing as told by your health care provider. ? Leave stitches (sutures) in place.  Check your incision and scrotum every day for signs of infection. Check for: ? More redness, swelling, or pain. ? Blood or fluid. ? Warmth. ? Pus or a bad smell. Managing pain, stiffness, and swelling  If directed, apply ice to the injured area: ? Put ice in a plastic bag. ? Place a towel between your skin and the bag. ? Leave the ice on for 20 minutes, 2-3 times per day. Driving  Do not drive for 24 hours if you were given a sedative.  Do not drive or use heavy machinery while taking prescription pain medicine.  Ask your health care provider when it is safe to drive. Activity  Do not do any activities that require great strength and energy (are vigorous) for as long as told by your health care provider.  Return to your normal activities as told by your health care provider. Ask your health care provider what activities are safe for you.  Do not lift anything that is heavier than 10  lb (4.5 kg) until your health care provider says that it is safe. General instructions  Take over-the-counter and prescription medicines only as told by your health care provider.  Keep all follow-up visits as told by your health care provider. This is important.  If you were given an athletic support strap, wear it as told by your health care provider.  If you had a drain put in during the procedure, you will need to return for a follow-up visit to have it removed. Contact a health care provider if:  Your pain is not controlled with medicine.  You have more redness or swelling around your scrotum.  You have blood or fluid coming from your scrotum.  Your incision feels warm to the touch.  You have pus or a bad smell coming from your scrotum.  You have a fever. This information is not intended to replace advice given to you by your health care provider. Make sure you discuss any questions you have with your health care provider. Document Released: 05/29/2015 Document Revised: 06/06/2016 Document Reviewed: 06/06/2016 Elsevier Interactive Patient Education  2018 [place].  Moderate Conscious Sedation, Adult, Care After These instructions provide you with information about caring for yourself after your procedure. Your health care provider may also give you more specific instructions. Your treatment has been planned according to current medical practices, but problems sometimes occur. Call your health care provider if you have any problems or questions after your procedure. What can I  expect after the procedure? After your procedure, it is common:  To feel sleepy for several hours.  To feel clumsy and have poor balance for several hours.  To have poor judgment for several hours.  To vomit if you eat too soon.  Follow these instructions at home: For at least 24 hours after the procedure:   Do not: ? Participate in activities where you could fall or become  injured. ? Drive. ? Use heavy machinery. ? Drink alcohol. ? Take sleeping pills or medicines that cause drowsiness. ? Make important decisions or sign legal documents. ? Take care of children on your own.  Rest. Eating and drinking  Follow the diet recommended by your health care provider.  If you vomit: ? Drink water, juice, or soup when you can drink without vomiting. ? Make sure you have little or no nausea before eating solid foods. General instructions  Have a responsible adult stay with you until you are awake and alert.  Take over-the-counter and prescription medicines only as told by your health care provider.  If you smoke, do not smoke without supervision.  Keep all follow-up visits as told by your health care provider. This is important. Contact a health care provider if:  You keep feeling nauseous or you keep vomiting.  You feel light-headed.  You develop a rash.  You have a fever. Get help right away if:  You have trouble breathing. This information is not intended to replace advice given to you by your health care provider. Make sure you discuss any questions you have with your health care provider. Document Released: 06/28/2013 Document Revised: 02/10/2016 Document Reviewed: 12/28/2015 Elsevier Interactive Patient Education  [employer].

## 2018-06-08 NOTE — Anesthesia Postprocedure Evaluation (Signed)
Anesthesia Post Note  Patient: Jermaine [name]  Procedure(s) Performed: LEFT HYDROCELE SCAR EXCISION (Left Scrotum)  Patient location during evaluation: PACU Anesthesia Type: General Level of consciousness: awake and patient cooperative Pain management: pain level controlled Vital Signs Assessment: post-procedure vital signs reviewed and stable Respiratory status: spontaneous breathing, nonlabored ventilation and respiratory function stable Cardiovascular status: blood pressure returned to baseline Postop Assessment: no apparent nausea or vomiting Anesthetic complications: no     Last Vitals:  Vitals:   06/08/18 1338 06/08/18 1345  BP: 114/76 109/80  Pulse: (!) 59 (!) 58  Resp: 13 12  Temp: 36.6 C   SpO2: 97% 95%    Last Pain:  Vitals:   06/08/18 1345  TempSrc:   PainSc: 0-No pain                 Aasia Peavler J

## 2018-06-08 NOTE — Transfer of Care (Signed)
Immediate Anesthesia Transfer of Care Note  Patient: Jermaine [name]  Procedure(s) Performed: LEFT HYDROCELE SCAR EXCISION (Left Scrotum)  Patient Location: PACU  Anesthesia Type:General  Level of Consciousness: drowsy  Airway & Oxygen Therapy: Patient Spontanous Breathing and Patient connected to nasal cannula oxygen  Post-op Assessment: Report given to RN and Post -op Vital signs reviewed and stable  Post vital signs: Reviewed and stable  Last Vitals:  Vitals Value Taken Time  BP    Temp    Pulse 59 06/08/2018  1:38 PM  Resp    SpO2 97 % 06/08/2018  1:38 PM  Vitals shown include unvalidated device data.  Last Pain:  Vitals:   06/08/18 1119  TempSrc: Oral  PainSc: 0-No pain         Complications: No apparent anesthesia complications
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
## 2018-06-08 NOTE — Brief Op Note (Signed)
06/08/2018  1:21 PM  PATIENT:  Jermaine [name]  37 y.o. male  PRE-OPERATIVE DIAGNOSIS:  LEFT TESTICULAR PAIN  POST-OPERATIVE DIAGNOSIS:  LEFT TESTICULAR PAIN  PROCEDURE:  Procedure(s): LEFT HYDROCELE SCAR EXCISION (Left)  SURGEON:  Surgeon(s) and Role:    * Ruqayya Ventress, Candee Furbish, MD - Primary  PHYSICIAN ASSISTANT:   ASSISTANTS: none   ANESTHESIA:   general  EBL:  0 mL   BLOOD ADMINISTERED:none  DRAINS: none   LOCAL MEDICATIONS USED:  MARCAINE     SPECIMEN:  Source of Specimen:  left scortal scar and hydrocele sac  DISPOSITION OF SPECIMEN:  PATHOLOGY  COUNTS:  YES  TOURNIQUET:  * No tourniquets in log *  DICTATION: .Note written in EPIC  PLAN OF CARE: Discharge to home after PACU  PATIENT DISPOSITION:  PACU - hemodynamically stable.   Delay start of Pharmacological VTE agent (>24hrs) due to surgical blood loss or risk of bleeding: not applicable

## 2018-06-09 ENCOUNTER — Encounter (HOSPITAL_COMMUNITY): Payer: Self-pay | Admitting: Urology

## 2018-06-18 ENCOUNTER — Emergency Department (HOSPITAL_COMMUNITY)
Admission: EM | Admit: 2018-06-18 | Discharge: 2018-06-18 | Disposition: A | Discharge status: Home / Self Care | Payer: Medicaid Other | Attending: Emergency Medicine | Admitting: Emergency Medicine

## 2018-06-18 ENCOUNTER — Other Ambulatory Visit: Payer: Self-pay

## 2018-06-18 ENCOUNTER — Encounter (HOSPITAL_COMMUNITY): Payer: Self-pay | Admitting: Emergency Medicine

## 2018-06-18 DIAGNOSIS — T8130XA Disruption of wound, unspecified, initial encounter: Secondary | ICD-10-CM

## 2018-06-18 DIAGNOSIS — I2510 Atherosclerotic heart disease of native coronary artery without angina pectoris: Secondary | ICD-10-CM | POA: Diagnosis not present

## 2018-06-18 DIAGNOSIS — Z79899 Other long term (current) drug therapy: Secondary | ICD-10-CM | POA: Diagnosis not present

## 2018-06-18 DIAGNOSIS — T8131XA Disruption of external operation (surgical) wound, not elsewhere classified, initial encounter: Secondary | ICD-10-CM | POA: Insufficient documentation

## 2018-06-18 DIAGNOSIS — F319 Bipolar disorder, unspecified: Secondary | ICD-10-CM | POA: Diagnosis not present

## 2018-06-18 DIAGNOSIS — Y829 Unspecified medical devices associated with adverse incidents: Secondary | ICD-10-CM | POA: Diagnosis not present

## 2018-06-18 DIAGNOSIS — F17210 Nicotine dependence, cigarettes, uncomplicated: Secondary | ICD-10-CM | POA: Diagnosis not present

## 2018-06-18 DIAGNOSIS — F419 Anxiety disorder, unspecified: Secondary | ICD-10-CM | POA: Insufficient documentation

## 2018-06-18 DIAGNOSIS — F1120 Opioid dependence, uncomplicated: Secondary | ICD-10-CM | POA: Diagnosis not present

## 2018-06-18 DIAGNOSIS — I10 Essential (primary) hypertension: Secondary | ICD-10-CM | POA: Insufficient documentation

## 2018-06-18 DIAGNOSIS — S3130XD Unspecified open wound of scrotum and testes, subsequent encounter: Secondary | ICD-10-CM | POA: Diagnosis present

## 2018-06-18 DIAGNOSIS — Z5189 Encounter for other specified aftercare: Secondary | ICD-10-CM

## 2018-06-18 MED ORDER — IBUPROFEN 800 MG PO TABS
800.0000 mg | ORAL_TABLET | Freq: Once | ORAL | Status: AC
  Administered 2018-06-18: 800 mg via ORAL
  Filled 2018-06-18: qty 1

## 2018-06-18 MED ORDER — OXYCODONE-ACETAMINOPHEN 5-325 MG PO TABS: ORAL_TABLET | ORAL | 0 refills | Status: DC

## 2018-06-18 MED ORDER — CEPHALEXIN 500 MG PO CAPS: 500.0000 mg | ORAL_CAPSULE | Freq: Four times a day (QID) | ORAL | 0 refills | Status: DC

## 2018-06-18 MED ORDER — ACETAMINOPHEN 500 MG PO TABS
1000.0000 mg | ORAL_TABLET | Freq: Once | ORAL | Status: AC
  Administered 2018-06-18: 1000 mg via ORAL
  Filled 2018-06-18: qty 2

## 2018-06-18 MED ORDER — CEPHALEXIN 500 MG PO CAPS
500.0000 mg | ORAL_CAPSULE | Freq: Once | ORAL | Status: AC
  Administered 2018-06-18: 500 mg via ORAL
  Filled 2018-06-18: qty 1

## 2018-06-18 NOTE — ED Triage Notes (Signed)
Scrotal surgery by Dr Alyson Ingles at [facility] 2 weeks ago  today bent over and when raise up. Felt wound open  Open wound to scrotum

## 2018-06-18 NOTE — Discharge Instructions (Addendum)
I discussed your case with Dr. Gloriann Loan.  Please change the wet to dry dressing daily.  Please call the office on Monday for an office appointment.  Please use Keflex with breakfast, lunch, dinner, and at bedtime.  Use Tylenol, and/or ibuprofen for mild pain.  May use Percocet for more severe pain. This medication may cause drowsiness. Please do not drink, drive, or participate in activity that requires concentration while taking this medication.
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
## 2018-06-20 NOTE — Op Note (Signed)
Preoperative diagnosis: left Hydrocele with scrotal wall adhesions  Postoperative diagnosis: Same  Procedure: 1. left hydrocelectomy 2. Excision of scrotal wall tissue 10 square cm  Attending: Nicolette Bang, MD  Anesthesia: General  History of blood loss: Minimal  Antibiotics: ancef  Drains: none  Specimens: 1. Left scrotal wall skin   Findings: small left hydrocele. Dense adhesion of the left hemiscrotum to the tesis  Indications: Patient is a 37 year old male with a history of left hydrocele status post excision who developed adhesions and recurrance of the hydrocele.   We discussed the treatment options including observation versus excision after discussing treatment options he proceed with excision.   Procedure in detail: Prior to procedure consent was obtained.  Patient was brought to the operating room and a brief timeout was done to ensure correct patient, correct procedure, correct site.  General anesthesia was administered and patient was placed in supine position.  His genitalia was then prepped and draped in usual sterile fashion.  A 4 cm incision was made in the left hemiscrotum circumferentially around the previous scar. We thn excised the scar and sent it for pathology.  We dissected down to the tunica and then incised the tunica. A large hydrocele was encountered and was drained. We then excised the hydrocele sac and then over sewed the edge with 2-0 Vicryl in a running fashion.  Hemostasis was then obtained with electrocautery.  We then returned the testis to the left hemiscrotum and closed the overlying dartos with 3-0 vicryl in a running fashion. The skin was then closed with 4-0 monocryl in a running fashion. Dermabond was placed on the incision.  A dressing was then applied to the incision.  We then placed a scrotal fluff and this then concluded the procedure which was well tolerated by the patient.  Complications: None  Condition: Stable, extubated, transferred to  PACU.  Plan: Patient is to be discharged home.  He is to follow up in 2 weeks for wound check.

## 2018-06-22 ENCOUNTER — Ambulatory Visit (INDEPENDENT_AMBULATORY_CARE_PROVIDER_SITE_OTHER): Payer: Medicaid Other | Admitting: Urology

## 2018-06-22 DIAGNOSIS — N433 Hydrocele, unspecified: Secondary | ICD-10-CM

## 2018-06-29 ENCOUNTER — Ambulatory Visit: Payer: Medicaid Other | Admitting: Adult Health

## 2018-06-29 ENCOUNTER — Encounter: Payer: Self-pay | Admitting: Adult Health

## 2018-06-29 VITALS — BP 102/70 | HR 56 | Ht 75.0 in

## 2018-06-29 DIAGNOSIS — R4189 Other symptoms and signs involving cognitive functions and awareness: Secondary | ICD-10-CM | POA: Diagnosis not present

## 2018-06-29 DIAGNOSIS — R569 Unspecified convulsions: Secondary | ICD-10-CM

## 2018-06-29 DIAGNOSIS — Z5181 Encounter for therapeutic drug level monitoring: Secondary | ICD-10-CM | POA: Diagnosis not present

## 2018-06-29 NOTE — Patient Instructions (Signed)
Your Plan:  Continue Lamictal and Depakote Blood work today Pending blood work may refer to [facility] for seizure monitoring   Thank you for coming to see [place] at [facility]. I hope we have been able to provide you high quality care today.  You may receive a patient satisfaction survey over the next few weeks. We would appreciate your feedback and comments so that we may continue to improve ourselves and the health of our patients.
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
## 2018-07-04 ENCOUNTER — Telehealth: Payer: Self-pay | Admitting: Adult Health

## 2018-07-04 NOTE — Telephone Encounter (Signed)
Spoke with Janett Billow and she stated that this past weekend she went next door to the neighbors house and when she came back the stove was and Mr. Stitely was on the couch asleep. She stated that when she woke him up and ask why was the stove on he denied turning the stove on. This has happened twice. She also stated that he is still dropping things and falling asleep at the drop of a hat. I advised her that Jinny Blossom was out of office until tomorrow 07/05/18. She verbalized understanding and stated that if anything else changes that she would call and update [place].

## 2018-07-04 NOTE — Telephone Encounter (Signed)
Pts fiance Janett Billow requesting a call to discuss the pts lab results, and to discuss a recent change in his memory such as leaving the stove on or sleeping throughout the day. Please advise

## 2018-07-05 NOTE — Telephone Encounter (Signed)
Spoke with Mr. Fallin fiance' Janett Billow( DPR verified) and she verbalized understanding the instructions given by [place]. No further questions at this time.

## 2018-07-05 NOTE — Telephone Encounter (Signed)
Lab work show slightly elevated ammonia level. Still waiting on Drug screen. Dr. Krista Blue do you want to wait until the drug screen results? Or should I adjust his Depakote based on ammonia level.

## 2018-07-05 NOTE — Telephone Encounter (Signed)
[place], please call and let the patients wife know that we are still waiting for drug screen to return. This can take some time. Once we have those results we will let them know.

## 2018-07-06 ENCOUNTER — Ambulatory Visit: Payer: Medicaid Other | Admitting: Urology

## 2018-07-06 DIAGNOSIS — R102 Pelvic and perineal pain: Secondary | ICD-10-CM | POA: Diagnosis not present

## 2018-07-07 ENCOUNTER — Telehealth: Payer: Self-pay | Admitting: Adult Health

## 2018-07-07 LAB — CBC WITH DIFFERENTIAL/PLATELET
BASOS ABS: 0 10*3/uL (ref 0.0–0.2)
Basos: 0 %
EOS (ABSOLUTE): 0.5 10*3/uL — ABNORMAL HIGH (ref 0.0–0.4)
Eos: 6 %
Hematocrit: 39.6 % (ref 37.5–51.0)
Hemoglobin: 13.2 g/dL (ref 13.0–17.7)
IMMATURE GRANS (ABS): 0 10*3/uL (ref 0.0–0.1)
IMMATURE GRANULOCYTES: 0 %
LYMPHS: 27 %
Lymphocytes Absolute: 2.5 10*3/uL (ref 0.7–3.1)
MCH: 30.4 pg (ref 26.6–33.0)
MCHC: 33.3 g/dL (ref 31.5–35.7)
MCV: 91 fL (ref 79–97)
Monocytes Absolute: 0.6 10*3/uL (ref 0.1–0.9)
Monocytes: 7 %
NEUTROS PCT: 60 %
Neutrophils Absolute: 5.6 10*3/uL (ref 1.4–7.0)
Platelets: 184 10*3/uL (ref 150–450)
RBC: 4.34 x10E6/uL (ref 4.14–5.80)
RDW: 12.9 % (ref 12.3–15.4)
WBC: 9.3 10*3/uL (ref 3.4–10.8)

## 2018-07-07 LAB — DRUG SCREEN 10 W/CONF, SERUM
AMPHETAMINES, IA: NEGATIVE ng/mL
Barbiturates, IA: NEGATIVE ug/mL
Benzodiazepines, IA: NEGATIVE ng/mL
Cocaine & Metabolite, IA: NEGATIVE ng/mL
METHADONE, IA: NEGATIVE ng/mL
OXYCODONES, IA: NEGATIVE ng/mL
Opiates, IA: NEGATIVE ng/mL
PHENCYCLIDINE, IA: NEGATIVE ng/mL
PROPOXYPHENE, IA: NEGATIVE ng/mL

## 2018-07-07 LAB — COMPREHENSIVE METABOLIC PANEL
ALK PHOS: 56 IU/L (ref 39–117)
ALT: 11 IU/L (ref 0–44)
AST: 12 IU/L (ref 0–40)
Albumin/Globulin Ratio: 2.2 (ref 1.2–2.2)
Albumin: 4.2 g/dL (ref 3.5–5.5)
BILIRUBIN TOTAL: 0.3 mg/dL (ref 0.0–1.2)
BUN/Creatinine Ratio: 7 — ABNORMAL LOW (ref 9–20)
BUN: 7 mg/dL (ref 6–20)
CHLORIDE: 100 mmol/L (ref 96–106)
CO2: 25 mmol/L (ref 20–29)
Calcium: 9.3 mg/dL (ref 8.7–10.2)
Creatinine, Ser: 1.04 mg/dL (ref 0.76–1.27)
GFR calc Af Amer: 106 mL/min/{1.73_m2} (ref >59)
GFR calc non Af Amer: 91 mL/min/{1.73_m2} (ref >59)
GLUCOSE: 60 mg/dL — AB (ref 65–99)
Globulin, Total: 1.9 g/dL (ref 1.5–4.5)
Potassium: 4 mmol/L (ref 3.5–5.2)
Sodium: 140 mmol/L (ref 134–144)
TOTAL PROTEIN: 6.1 g/dL (ref 6.0–8.5)

## 2018-07-07 LAB — THC,MS,WB/SP RFX
CANNABINOL: NEGATIVE ng/mL
Cannabidiol: NEGATIVE ng/mL
HYDROXY-THC: NEGATIVE ng/mL
Tetrahydrocannabinol(THC): NEGATIVE ng/mL

## 2018-07-07 LAB — OPIATES,MS,WB/SP RFX
6-ACETYLMORPHINE: NEGATIVE
Codeine: NEGATIVE ng/mL
DIHYDROCODEINE: NEGATIVE ng/mL
HYDROCODONE: NEGATIVE ng/mL
Hydromorphone: NEGATIVE ng/mL
Morphine: NEGATIVE ng/mL
Opiate Confirmation: NEGATIVE

## 2018-07-07 LAB — AMMONIA: AMMONIA: 104 ug/dL — AB (ref 27–102)

## 2018-07-07 LAB — VALPROIC ACID LEVEL: VALPROIC ACID LVL: 89 ug/mL (ref 50–100)

## 2018-07-07 LAB — LAMOTRIGINE LEVEL: LAMOTRIGINE LVL: 3.3 ug/mL (ref 2.0–20.0)

## 2018-07-07 MED ORDER — LAMOTRIGINE 100 MG PO TBDP: ORAL_TABLET | ORAL | 5 refills | Status: DC

## 2018-07-07 NOTE — Telephone Encounter (Signed)
Pt wife has called back, message from NP Jinny Blossom was read to her.  Pt wife asking if at all possible to call back this evening, when in the house the phone does not work as good

## 2018-07-07 NOTE — Addendum Note (Signed)
Addended by: Trudie Buckler on: 07/07/2018 05:16 PM   Modules accepted: Orders

## 2018-07-07 NOTE — Telephone Encounter (Signed)
I called the patient and LVM for him to call our office. I will try again in the morning.

## 2018-07-07 NOTE — Telephone Encounter (Signed)
I called the patient's wife.  Advised that I have discussed with Dr. Krista Blue.  Blood work was relatively unremarkable.  Ammonia level is only slightly elevated.  He will continue on current dose of Depakote.  We will increase to 100 mg in the morning and 200 mg in the evening.  The patient is also on several other medications that could be contributing to his symptoms of feeling "out of it" during the day and dropping things.  Advised that they should consult with his psychiatrist and PCP to see if some of his medications could be eliminated or reduced.  The patient has an appointment with that this for admission to the EMU on November 11.

## 2018-07-07 NOTE — Telephone Encounter (Signed)
Can you schedule a 4 week follow-up with me or Dr. Krista Blue

## 2018-07-08 NOTE — Telephone Encounter (Signed)
LVM advising the patient that Jermaine Circle, NP wants him to be seen by her or Dr Krista Blue in 4 weeks. Requested a call back to schedule, gave office hours and number.

## 2018-07-11 ENCOUNTER — Ambulatory Visit: Payer: Medicaid Other | Admitting: Podiatry

## 2018-07-11 ENCOUNTER — Encounter: Payer: Self-pay | Admitting: Podiatry

## 2018-07-11 VITALS — BP 112/72 | HR 73 | Resp 16 | Ht 74.0 in | Wt 170.0 lb

## 2018-07-11 DIAGNOSIS — M79672 Pain in left foot: Secondary | ICD-10-CM

## 2018-07-11 DIAGNOSIS — D492 Neoplasm of unspecified behavior of bone, soft tissue, and skin: Secondary | ICD-10-CM | POA: Diagnosis not present

## 2018-07-11 DIAGNOSIS — L989 Disorder of the skin and subcutaneous tissue, unspecified: Secondary | ICD-10-CM

## 2018-07-11 DIAGNOSIS — Q828 Other specified congenital malformations of skin: Secondary | ICD-10-CM
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
## 2018-07-11 NOTE — Telephone Encounter (Signed)
LVM #2 requesting the patient call back to schedule 4 week FU with Jinny Blossom NP or Dr Krista Blue.

## 2018-07-18 ENCOUNTER — Encounter: Payer: Self-pay | Admitting: *Deleted

## 2018-07-18 NOTE — Telephone Encounter (Signed)
LVM #3. Will mail letter today asking patient to call and schedule FU with Dr Krista Blue or Edman Circle, NP.

## 2018-07-29 ENCOUNTER — Telehealth: Payer: Self-pay | Admitting: Internal Medicine

## 2018-07-29 NOTE — Telephone Encounter (Signed)
Pt wanted to speak with the nurse. Please call [phone]

## 2018-08-01 DIAGNOSIS — G4733 Obstructive sleep apnea (adult) (pediatric): Secondary | ICD-10-CM | POA: Insufficient documentation

## 2018-08-01 DIAGNOSIS — R569 Unspecified convulsions: Secondary | ICD-10-CM | POA: Insufficient documentation

## 2018-08-01 DIAGNOSIS — F319 Bipolar disorder, unspecified: Secondary | ICD-10-CM | POA: Insufficient documentation

## 2018-08-01 DIAGNOSIS — F603 Borderline personality disorder: Secondary | ICD-10-CM | POA: Insufficient documentation

## 2018-08-01 NOTE — Telephone Encounter (Signed)
Spoke with pts significant other. Pt is at [facility] in the hospital for 5 being observed for his seizures. They will contact us back when and if needed.

## 2018-08-03 ENCOUNTER — Ambulatory Visit: Payer: Medicaid Other | Admitting: Urology

## 2018-08-09 ENCOUNTER — Other Ambulatory Visit: Payer: Self-pay

## 2018-08-09 ENCOUNTER — Emergency Department (HOSPITAL_COMMUNITY)
Admission: EM | Admit: 2018-08-09 | Discharge: 2018-08-09 | Disposition: A | Discharge status: Home / Self Care | Payer: Medicaid Other | Attending: Emergency Medicine | Admitting: Emergency Medicine

## 2018-08-09 ENCOUNTER — Encounter (HOSPITAL_COMMUNITY): Payer: Self-pay | Admitting: Emergency Medicine

## 2018-08-09 DIAGNOSIS — I259 Chronic ischemic heart disease, unspecified: Secondary | ICD-10-CM | POA: Insufficient documentation

## 2018-08-09 DIAGNOSIS — F17210 Nicotine dependence, cigarettes, uncomplicated: Secondary | ICD-10-CM | POA: Diagnosis not present

## 2018-08-09 DIAGNOSIS — Z79899 Other long term (current) drug therapy: Secondary | ICD-10-CM | POA: Insufficient documentation

## 2018-08-09 DIAGNOSIS — K5901 Slow transit constipation: Secondary | ICD-10-CM | POA: Insufficient documentation

## 2018-08-09 DIAGNOSIS — I10 Essential (primary) hypertension: Secondary | ICD-10-CM | POA: Insufficient documentation

## 2018-08-09 DIAGNOSIS — K625 Hemorrhage of anus and rectum: Secondary | ICD-10-CM | POA: Diagnosis present

## 2018-08-09 LAB — CBC
HCT: 38.5 % — ABNORMAL LOW (ref 39.0–52.0)
Hemoglobin: 12.8 g/dL — ABNORMAL LOW (ref 13.0–17.0)
MCH: 30.7 pg (ref 26.0–34.0)
MCHC: 33.2 g/dL (ref 30.0–36.0)
MCV: 92.3 fL (ref 80.0–100.0)
NRBC: 0 % (ref 0.0–0.2)
PLATELETS: 154 10*3/uL (ref 150–400)
RBC: 4.17 MIL/uL — ABNORMAL LOW (ref 4.22–5.81)
RDW: 13.1 % (ref 11.5–15.5)
WBC: 7.2 10*3/uL (ref 4.0–10.5)

## 2018-08-09 LAB — BASIC METABOLIC PANEL
ANION GAP: 7 (ref 5–15)
BUN: 7 mg/dL (ref 6–20)
CO2: 25 mmol/L (ref 22–32)
Calcium: 8.9 mg/dL (ref 8.9–10.3)
Chloride: 106 mmol/L (ref 98–111)
Creatinine, Ser: 0.9 mg/dL (ref 0.61–1.24)
GFR calc Af Amer: >60 mL/min (ref >60)
Glucose, Bld: 88 mg/dL (ref 70–99)
POTASSIUM: 3.8 mmol/L (ref 3.5–5.1)
SODIUM: 138 mmol/L (ref 135–145)

## 2018-08-09 LAB — PROTIME-INR
INR: 0.93
PROTHROMBIN TIME: 12.4 s (ref 11.4–15.2)

## 2018-08-09 NOTE — Discharge Instructions (Signed)
I have spoken with Dr. Oneida Alar, the on-call GI doctor about follow-up, she want you to call the office to schedule a smart pill study.  Return to the emergency department for worsening pain vomiting or increased amounts of bleeding but I would expect to have a small amount of bleeding over the next couple of days.  In the meantime I would highly recommend that you discuss stopping any opiate medications including Subutex with your doctor as this is causing and contributing to ongoing difficulty with bowel movements.  Is call your doctor in the morning to discuss this.
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
## 2018-08-09 NOTE — ED Triage Notes (Signed)
Pt states having blood in stool x 2 days. Denies being on blood thinners. 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

## 2018-08-10 ENCOUNTER — Encounter: Payer: Self-pay | Admitting: Gastroenterology

## 2018-08-10 ENCOUNTER — Ambulatory Visit (INDEPENDENT_AMBULATORY_CARE_PROVIDER_SITE_OTHER): Payer: Medicaid Other | Admitting: Gastroenterology

## 2018-08-10 VITALS — BP 129/78 | HR 79 | Temp 96.7°F | Ht 72.0 in | Wt 210.6 lb

## 2018-08-10 DIAGNOSIS — K5900 Constipation, unspecified: Secondary | ICD-10-CM | POA: Diagnosis not present

## 2018-08-10 DIAGNOSIS — K625 Hemorrhage of anus and rectum: Secondary | ICD-10-CM | POA: Diagnosis not present

## 2018-08-10 MED ORDER — LINACLOTIDE 290 MCG PO CAPS: 290.0000 ug | ORAL_CAPSULE | Freq: Every day | ORAL | 5 refills | Status: DC

## 2018-08-10 MED ORDER — POLYETHYLENE GLYCOL 3350 17 GM/SCOOP PO POWD: ORAL | 3 refills | Status: DC

## 2018-08-10 NOTE — Progress Notes (Signed)
Primary Care Physician: [name], MD  Primary Gastroenterologist:  Garfield Cornea, MD   Chief Complaint  Patient presents with  . Constipation    went to ED yesterday  . Abdominal Pain    lower mid abd  . Blood In Stools    HPI: Jermaine [name] is a 37 y.o. male here for follow up constipation/obstipation.   Last seen in 02/2018. He has chronic intermittent N/V associated with severe constipation/obstipation, weight loss. Hospitalized in 01/2018 with subtotal colectomy plans but postponed as patient was noncompliant with diet prior to surgery. Local surgeon recommended tertiary care opinion regarding subtotal colectomy. Patient was ultimately seen by [facility] colorectal surgery, Dr. Edison Nasuti. He was advised to complete sitz marker study and colonoscopy prior to consideration of colon surgery. Recommended getting off narcotics and subutex prior to sitz marker. GI f/u recommended.   Attempted colonoscopy 12/2017, failed due to poor prep.  Barium enema 01/2018, scattered retained stool throughout colon. Contrast passed to cecum.   CT [facility]/P with [facility] at [facility] 02/2018: moderate to large amt of stool within the colon.   Since last OV here he has had surgery for hydrocele scar excision. States he was given pain medication post-operatively. Off opoids for several weeks. Still having BM once every 3-4 weeks. Using a spoon to help get stool out. Stools pink and red. First wipe, dark blood. Then lightens up. Constant rectal pressure all the time. Abdominal bloating and discomfort especially lower abd. Intermittent vomiting at least 1-2 times per week. No heartburn.  09/2017 248 lb 10/2017 212 lb 12/2017 224 lb 02/2018 202 lb 05/2018 180 lb 07/2018 210 lb (verified twice today as weight of 160 listed yesterday)  Seen in ed for constipation/rectal bleeding yesterday. No imaging, labs below.    Current Outpatient Medications  Medication Sig Dispense Refill  . amLODipine (NORVASC) 5 MG tablet  Take 5 mg by mouth daily.   3  . busPIRone (BUSPAR) 5 MG tablet Take 5 mg by mouth 3 (three) times daily.    . cloNIDine (CATAPRES) 0.1 MG tablet Take 0.1 mg by mouth 3 (three) times daily.   0  . hydrOXYzine (VISTARIL) 25 MG capsule Take 25 mg by mouth 3 (three) times daily as needed for anxiety.   0  . lithium carbonate 300 MG capsule Take 1 capsule (300 mg total) by mouth 3 (three) times daily with meals. (Patient taking differently: Take 300 mg by mouth See admin instructions. Take 1 capsule in the morning and 2 capsules at night.) 90 capsule 0  . metoprolol tartrate (LOPRESSOR) 50 MG tablet Take 1 tablet (50 mg total) by mouth 2 (two) times daily. 60 tablet 0  . omeprazole (PRILOSEC) 20 MG capsule Take 20 mg by mouth daily.   0  . ondansetron (ZOFRAN) 4 MG tablet Take 4 mg by mouth every 4 (four) hours as needed.   3  . promethazine (PHENERGAN) 25 MG suppository Place 1 suppository (25 mg total) rectally every 6 (six) hours as needed for nausea or vomiting. 12 each 0  . promethazine (PHENERGAN) 25 MG tablet Take 25 mg by mouth every 6 (six) hours as needed for nausea or vomiting.    Marland Kitchen QUEtiapine (SEROQUEL) 300 MG tablet Take 300 mg by mouth at bedtime.     . simvastatin (ZOCOR) 10 MG tablet Take 10 mg by mouth daily.    . TRANSDERM-SCOP, 1.5 MG, 1 MG/3DAYS Place 1 patch onto the skin every 3 (  three) days.   2  . trazodone (DESYREL) 300 MG tablet Take 150 mg by mouth at bedtime.      No current facility-administered medications for this visit.     Allergies as of 08/10/2018 - Review Complete 08/10/2018  Allergen Reaction Noted  . Hydrocodone Itching 05/08/2013    ROS:  General: Negative for anorexia,  fever, chills, fatigue, weakness. See hpi ENT: Negative for hoarseness, difficulty swallowing , nasal congestion. CV: Negative for chest pain, angina, palpitations, dyspnea on exertion, peripheral edema.  Respiratory: Negative for dyspnea at rest, dyspnea on exertion, cough, sputum,  wheezing.  GI: See history of present illness. GU:  Negative for dysuria, hematuria, urinary incontinence, urinary frequency, nocturnal urination.  Endo: see hpi   Physical Examination:   BP 129/78   Pulse 79   Temp (!) 96.7 F (35.9 C) (Oral)   Ht 6' (1.829 m)   Wt 210 lb 9.6 oz (95.5 kg)   BMI 28.56 kg/m   General: Well-nourished, well-developed in no acute distress.  Eyes: No icterus. Mouth: Oropharyngeal mucosa moist and pink , no lesions erythema or exudate. Lungs: Clear to auscultation bilaterally.  Heart: Regular rate and rhythm, no murmurs rubs or gallops.  Abdomen: Bowel sounds are normal, mildly distended, full, mild lower abd tenderness, no hepatosplenomegaly or masses, no abdominal bruits or hernia , no rebound or guarding.   Extremities: No lower extremity edema. No clubbing or deformities. Neuro: Alert and oriented x 4   Skin: Warm and dry, no jaundice.   Psych: Alert and cooperative, normal mood and affect.  Labs:  Lab Results  Component Value Date   INR 0.93 08/09/2018   Lab Results  Component Value Date   CREATININE 0.90 08/09/2018   BUN 7 08/09/2018   NA 138 08/09/2018   K 3.8 08/09/2018   CL 106 08/09/2018   CO2 25 08/09/2018   Lab Results  Component Value Date   WBC 7.2 08/09/2018   HGB 12.8 (L) 08/09/2018   HCT 38.5 (L) 08/09/2018   MCV 92.3 08/09/2018   PLT 154 08/09/2018   Lab Results  Component Value Date   ALT 11 06/29/2018   AST 12 06/29/2018   ALKPHOS 56 06/29/2018   BILITOT 0.3 06/29/2018   No results found for: IRON, TIBC, FERRITIN  Imaging Studies: No results found.

## 2018-08-10 NOTE — Patient Instructions (Addendum)
1. Take Plenvu prep, make sure you drink all the liquid it requires (it will not work unless you do). See instructions below.  2. Start Miralax one capful twice daily with full glass of water the day after taking Plenvu.  3. Take Linzess 265mcg daily WITH food, start the day after taking Plenvu. 4. I will check on getting a motility test done locally for you. Further recommendations to follow.     CLEAR LIQUIDS ALL DAY- MAKE SURE YOU DRINK AT LEAST 64 OUNCES OF CLEAR LIQUIDS PRIOR TO STARTING YOUR PREP AND HYDRATE THROUGHOUT YOUR BOWEL PREP!   You can premix your dose of Plenvu and store in refrigerator to get cold BUT you must use within 6 hours!   At 3:00 PM Begin the prep as follows:    1. Empty contents of Dose 1 into the mixing container that comes with PLENVU.  2. Add water to the fill line on the mixing container (at least 16 fluid ounces). Do not add other ingredients to the solution.  3. Thoroughly mix with a spoon or shake with lid on securely until completely dissolved (which may take 2 to 3 minutes).  4. Drink over the next 30 minutes. Be sure to drink all of the solution.  5. Refill the mixing container to the fill line (at least 16 fluid ounces) with clear liquids and drink over the next 30 minutes.      Wait at least two hours after you take the first dose and then take second dose as follows.  1. Empty the contents of Dose 2 Pouch A and Dose 2 Pouch B into the mixing container that comes with PLENVU.  2. Add water to the fill line on the mixing container (at least 16 fluid ounces).  Do not add other ingredients to the solution. 3. Thoroughly mixed with a spoon or shake with lid on securely until completely dissolved (which may take 2-3 minutes).  Drink over the next 30 minutes.  Be sure to drink all of the solution. 4. Refill the mixing container to the fill line (at least 16 fluid ounces) with clear liquids and drink over the next 30 minutes.

## 2018-08-10 NOTE — Assessment & Plan Note (Signed)
Significant constipation/obstipation. No improvement off opioids. Requiring disimpaction as outlined. Colorectal surgery advised coming off opioids. Sitz marker testing and complete colonoscopy. Patient returns to try and get these test done. Still going weeks without BM. Currently not on bowel regimen. Last opioids 3 weeks ago.   Start Plenvu bowel prep today. Tomorrow start miralax 17g bid, Linzess 276mcg daily.   Will see if sitz marker testing available or Smart Pill. Consider colonoscopy once bowel function is manageable. Patient previously was not adequately prepped. Further recommendations to follow.

## 2018-08-11 NOTE — Progress Notes (Signed)
CC'D TO PCP °

## 2018-08-25 ENCOUNTER — Other Ambulatory Visit: Payer: Self-pay

## 2018-08-25 ENCOUNTER — Encounter (HOSPITAL_COMMUNITY): Payer: Self-pay | Admitting: *Deleted

## 2018-08-25 ENCOUNTER — Inpatient Hospital Stay (HOSPITAL_COMMUNITY)
Admission: RE | Admit: 2018-08-25 | Discharge: 2018-09-02 | DRG: 885 | Disposition: A | Discharge status: Home / Self Care | Payer: Medicaid Other | Attending: Emergency Medicine | Admitting: Emergency Medicine

## 2018-08-25 DIAGNOSIS — K581 Irritable bowel syndrome with constipation: Secondary | ICD-10-CM | POA: Diagnosis not present

## 2018-08-25 DIAGNOSIS — G4700 Insomnia, unspecified: Secondary | ICD-10-CM | POA: Diagnosis present

## 2018-08-25 DIAGNOSIS — Z800 Family history of malignant neoplasm of digestive organs: Secondary | ICD-10-CM | POA: Diagnosis not present

## 2018-08-25 DIAGNOSIS — E7801 Familial hypercholesterolemia: Secondary | ICD-10-CM | POA: Diagnosis not present

## 2018-08-25 DIAGNOSIS — F339 Major depressive disorder, recurrent, unspecified: Secondary | ICD-10-CM | POA: Diagnosis present

## 2018-08-25 DIAGNOSIS — Z885 Allergy status to narcotic agent status: Secondary | ICD-10-CM

## 2018-08-25 DIAGNOSIS — Z823 Family history of stroke: Secondary | ICD-10-CM | POA: Diagnosis not present

## 2018-08-25 DIAGNOSIS — F17210 Nicotine dependence, cigarettes, uncomplicated: Secondary | ICD-10-CM | POA: Diagnosis present

## 2018-08-25 DIAGNOSIS — F332 Major depressive disorder, recurrent severe without psychotic features: Secondary | ICD-10-CM | POA: Diagnosis not present

## 2018-08-25 DIAGNOSIS — Z915 Personal history of self-harm: Secondary | ICD-10-CM | POA: Diagnosis not present

## 2018-08-25 DIAGNOSIS — M25561 Pain in right knee: Secondary | ICD-10-CM | POA: Diagnosis not present

## 2018-08-25 DIAGNOSIS — I10 Essential (primary) hypertension: Secondary | ICD-10-CM | POA: Diagnosis present

## 2018-08-25 DIAGNOSIS — K219 Gastro-esophageal reflux disease without esophagitis: Secondary | ICD-10-CM | POA: Diagnosis present

## 2018-08-25 DIAGNOSIS — F410 Panic disorder [episodic paroxysmal anxiety] without agoraphobia: Secondary | ICD-10-CM | POA: Diagnosis present

## 2018-08-25 DIAGNOSIS — R946 Abnormal results of thyroid function studies: Secondary | ICD-10-CM | POA: Diagnosis present

## 2018-08-25 DIAGNOSIS — F333 Major depressive disorder, recurrent, severe with psychotic symptoms: Secondary | ICD-10-CM | POA: Diagnosis not present

## 2018-08-25 DIAGNOSIS — Z8249 Family history of ischemic heart disease and other diseases of the circulatory system: Secondary | ICD-10-CM

## 2018-08-25 DIAGNOSIS — F1190 Opioid use, unspecified, uncomplicated: Secondary | ICD-10-CM | POA: Diagnosis present

## 2018-08-25 DIAGNOSIS — R52 Pain, unspecified: Secondary | ICD-10-CM

## 2018-08-25 DIAGNOSIS — G8929 Other chronic pain: Secondary | ICD-10-CM | POA: Diagnosis present

## 2018-08-25 DIAGNOSIS — R45851 Suicidal ideations: Secondary | ICD-10-CM | POA: Diagnosis present

## 2018-08-25 DIAGNOSIS — F419 Anxiety disorder, unspecified: Secondary | ICD-10-CM | POA: Diagnosis not present

## 2018-08-25 DIAGNOSIS — F3130 Bipolar disorder, current episode depressed, mild or moderate severity, unspecified: Principal | ICD-10-CM | POA: Diagnosis present

## 2018-08-25 DIAGNOSIS — F1290 Cannabis use, unspecified, uncomplicated: Secondary | ICD-10-CM | POA: Diagnosis not present

## 2018-08-25 DIAGNOSIS — G4089 Other seizures: Secondary | ICD-10-CM | POA: Diagnosis not present

## 2018-08-25 DIAGNOSIS — Z833 Family history of diabetes mellitus: Secondary | ICD-10-CM | POA: Diagnosis not present

## 2018-08-25 DIAGNOSIS — R569 Unspecified convulsions: Secondary | ICD-10-CM

## 2018-08-25 DIAGNOSIS — E785 Hyperlipidemia, unspecified: Secondary | ICD-10-CM | POA: Diagnosis not present

## 2018-08-25 MED ORDER — QUETIAPINE FUMARATE 50 MG PO TABS
150.0000 mg | ORAL_TABLET | Freq: Every evening | ORAL | Status: DC | PRN
  Administered 2018-08-25 (×2): 150 mg via ORAL
  Filled 2018-08-25 (×5): qty 1

## 2018-08-25 MED ORDER — NICOTINE 21 MG/24HR TD PT24
21.0000 mg | MEDICATED_PATCH | Freq: Every day | TRANSDERMAL | Status: DC
  Administered 2018-08-26 – 2018-08-29 (×4): 21 mg via TRANSDERMAL
  Filled 2018-08-25 (×7): qty 1

## 2018-08-25 MED ORDER — TRAZODONE HCL 50 MG PO TABS
50.0000 mg | ORAL_TABLET | Freq: Every evening | ORAL | Status: DC | PRN
  Administered 2018-08-25 – 2018-08-29 (×4): 50 mg via ORAL
  Filled 2018-08-25 (×4): qty 1

## 2018-08-25 MED ORDER — ACETAMINOPHEN 325 MG PO TABS
650.0000 mg | ORAL_TABLET | Freq: Four times a day (QID) | ORAL | Status: DC | PRN
  Administered 2018-08-26: 650 mg via ORAL
  Filled 2018-08-25: qty 2

## 2018-08-25 MED ORDER — ALUM & MAG HYDROXIDE-SIMETH 200-200-20 MG/5ML PO SUSP
30.0000 mL | ORAL | Status: DC | PRN
  Administered 2018-08-31: 30 mL via ORAL
  Filled 2018-08-25: qty 30

## 2018-08-25 MED ORDER — HYDROXYZINE HCL 25 MG PO TABS
25.0000 mg | ORAL_TABLET | Freq: Four times a day (QID) | ORAL | Status: DC | PRN
  Administered 2018-08-25 – 2018-09-02 (×11): 25 mg via ORAL
  Filled 2018-08-25 (×12): qty 1

## 2018-08-25 MED ORDER — MAGNESIUM HYDROXIDE 400 MG/5ML PO SUSP
30.0000 mL | Freq: Every day | ORAL | Status: DC | PRN
  Filled 2018-08-25: qty 30

## 2018-08-25 NOTE — Tx Team (Signed)
Initial Treatment Plan 08/25/2018 7:14 PM [name] JQZ:[id number]    PATIENT STRESSORS: Financial difficulties Health problems Occupational concerns Substance abuse   PATIENT STRENGTHS: Ability for insight Active sense of humor Average or above average intelligence Capable of independent living Communication skills General fund of knowledge Motivation for treatment/growth Physical Health Supportive family/friends   PATIENT IDENTIFIED PROBLEMS: "anxiety"  "depression"  "panic"  "suicidal thoughts"  'substance abuse"             DISCHARGE CRITERIA:  Ability to meet basic life and health needs Adequate post-discharge living arrangements Improved stabilization in mood, thinking, and/or behavior Medical problems require only outpatient monitoring Motivation to continue treatment in a less acute level of care Need for constant or close observation no longer present Reduction of life-threatening or endangering symptoms to within safe limits Safe-care adequate arrangements made Verbal commitment to aftercare and medication compliance Withdrawal symptoms are absent or subacute and managed without 24-hour nursing intervention  PRELIMINARY DISCHARGE PLAN: Attend aftercare/continuing care group Attend PHP/IOP Attend 12-step recovery group Outpatient therapy Return to previous living arrangement  PATIENT/FAMILY INVOLVEMENT: This treatment plan has been presented to and reviewed with the patient, Jermaine [name]..  The patient and family have been given the opportunity to ask questions and make suggestions.  Jermaine [name] [place], RN 08/25/2018, 7:14 PM

## 2018-08-25 NOTE — H&P (Signed)
Behavioral Health Medical Screening Exam  Jermaine [name] is a 37 y.o. male, Caucasian with previous hx of mental illness & opioid & alcohol use disorders. He came to the [facility] as a walk-in seeking treatment for worsening depression. He reports suicidal ideations with plans to drive to a creek & blow his brains out. He says he developed seizure activities last August, 2018 & as a result lost his job, no longer able to provide for his family. He has a hx of suicide attempt at the age of 65 by cutting his right wrist. He says he has been depressed all his life. Has an outpatient provider Alda Ponder at [facility]. He reports hx of high blood pressure & is on metoprolol, Amlodipine & Simvastatin. He reports hx of back pain. Patient is recommended for inpatient admission & informed that we will be unable to prescribe any pain medications besides Gabapentin. However, his depression will be addressed. He is in agreement. His primary care physician id Dr. Felipe Drone.  Total Time spent with patient: 30 minutes  Psychiatric Specialty Exam: Physical Exam  Nursing note and vitals reviewed. Constitutional: He is oriented to person, place, and time. He appears well-developed.  HENT:  Head: Normocephalic.  Cardiovascular: Normal rate.  Hx. HTN  Respiratory: Effort normal. No respiratory distress. He has no wheezes. He has no rales.  GI: Soft.  Genitourinary:  Genitourinary Comments: Deferred  Musculoskeletal: Normal range of motion.  Neurological: He is alert and oriented to person, place, and time.  Skin: Skin is warm and dry.    Review of Systems  Constitutional: Negative.  Negative for chills, diaphoresis, fever and malaise/fatigue.  HENT: Negative.   Eyes: Negative.   Respiratory: Negative.  Negative for cough and shortness of breath.   Cardiovascular: Negative.  Negative for chest pain and palpitations.  Gastrointestinal: Positive for constipation (Hx. of ). Negative for abdominal pain, heartburn, nausea and  vomiting.       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   Musculoskeletal: Positive for back pain, joint pain and myalgias.  Skin: Negative.   Neurological: Negative for dizziness and headaches. Seizures: Reports hx of seizure activities last August, 2018.  Endo/Heme/Allergies: Negative.   Psychiatric/Behavioral: Positive for depression, substance abuse (Hx. Opioid use disorder) and suicidal ideas. Negative for hallucinations and memory loss. The patient is nervous/anxious and has insomnia.     Blood pressure (!) 128/91, pulse 73, temperature 98 F (36.7 C), temperature source Oral, resp. rate 16, SpO2 99 %.There is no height or weight on file to calculate BMI.  General Appearance: Casual and Fairly Groomed  Eye Contact:  Fair  Speech:  Clear and Coherent and Normal Rate  Volume:  Normal  Mood:  Depressed with borderline traits.  Affect:  Full Range  Thought Process:  Coherent and Descriptions of Associations: Intact  Orientation:  Full (Time, Place, and Person)  Thought Content:  Rumination  Suicidal Thoughts:  Yes.  with intent/plan to drive to a creek blow his brains out.  Homicidal Thoughts:  Denies  Memory:  Immediate;   Good Recent;   Good Remote;   Good  Judgement:  Fair  Insight:  Lacking  Psychomotor Activity:  Normal  Concentration: Concentration: Good and Attention Span: Good  Recall:  Good  Fund of Knowledge:Fair  Language: Good  Akathisia:  No  Handed:  Right  AIMS (if indicated):     Assets:  Communication Skills Desire for Improvement Social Support  Sleep: New admission.   Musculoskeletal: Strength &  Muscle Tone: within normal limits Gait & Station: normal Patient leans: N/A  Blood pressure (!) 128/91, pulse 73, temperature 98 F (36.7 C), temperature source Oral, resp. rate 16, SpO2 99 %.  Recommendations: Based on my evaluation the patient does not appear to have an emergency medical condition. However, he is presenting with worsening  symptoms of depression & anxiety. He is threatening to drive to a creek & blow his brains out with a gun if he did not get any help for his depression. He says he has access to guns. Reports hx of suicide attempt by self-mutilation at the age of 6 after his grandmother died.  Lindell Spar, NP, PMHNP, FNP-BC. 08/25/2018, 3:17 PM
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
## 2018-08-25 NOTE — Plan of Care (Signed)
Nurse discussed depression and coping skills with patient.  

## 2018-08-25 NOTE — Plan of Care (Signed)
Nurse discussed anxiety, depression, coping skills with patient. 

## 2018-08-25 NOTE — Progress Notes (Signed)
Pt is observed in the dayroom, seen interacting with peers. Pt appears labile/irritable in affect and mood. Pt denies SI/HI/AVH/Pain at this time. Pt appears preoccupied with various somatic c/o's. Provider on call notified and order was obtained for Seroquel. Pt appears restless and approach NS and staff multiple times throughout the night. Pt gets easily irritable when requests are denied. PRN vistaril and trazodone offered and accepted.Support and encouragement offered.Will continue with POC.
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
## 2018-08-26 ENCOUNTER — Inpatient Hospital Stay (HOSPITAL_COMMUNITY): Payer: Medicaid Other

## 2018-08-26 DIAGNOSIS — F332 Major depressive disorder, recurrent severe without psychotic features: Secondary | ICD-10-CM

## 2018-08-26 LAB — COMPREHENSIVE METABOLIC PANEL
ALT: 15 U/L (ref 0–44)
AST: 18 U/L (ref 15–41)
Albumin: 4 g/dL (ref 3.5–5.0)
Alkaline Phosphatase: 65 U/L (ref 38–126)
Anion gap: 10 (ref 5–15)
BUN: 11 mg/dL (ref 6–20)
CO2: 24 mmol/L (ref 22–32)
Calcium: 8.8 mg/dL — ABNORMAL LOW (ref 8.9–10.3)
Chloride: 103 mmol/L (ref 98–111)
Creatinine, Ser: 0.95 mg/dL (ref 0.61–1.24)
GFR calc Af Amer: >60 mL/min (ref >60)
GFR calc non Af Amer: >60 mL/min (ref >60)
GLUCOSE: 106 mg/dL — AB (ref 70–99)
Potassium: 3.9 mmol/L (ref 3.5–5.1)
Sodium: 137 mmol/L (ref 135–145)
Total Bilirubin: 0.4 mg/dL (ref 0.3–1.2)
Total Protein: 6.8 g/dL (ref 6.5–8.1)

## 2018-08-26 LAB — URINALYSIS, ROUTINE W REFLEX MICROSCOPIC
Bilirubin Urine: NEGATIVE
Glucose, UA: NEGATIVE mg/dL
HGB URINE DIPSTICK: NEGATIVE
Ketones, ur: NEGATIVE mg/dL
Leukocytes, UA: NEGATIVE
Nitrite: NEGATIVE
Protein, ur: NEGATIVE mg/dL
Specific Gravity, Urine: 1.011 (ref 1.005–1.030)
pH: 7 (ref 5.0–8.0)

## 2018-08-26 LAB — LIPID PANEL
Cholesterol: 189 mg/dL (ref 0–200)
HDL: 29 mg/dL — ABNORMAL LOW (ref >40)
LDL Cholesterol: UNDETERMINED mg/dL (ref 0–99)
Total CHOL/HDL Ratio: 6.5 RATIO
Triglycerides: 410 mg/dL — ABNORMAL HIGH (ref <150)
VLDL: UNDETERMINED mg/dL (ref 0–40)

## 2018-08-26 LAB — TSH: TSH: 6.701 u[IU]/mL — ABNORMAL HIGH (ref 0.350–4.500)

## 2018-08-26 LAB — CBC
HCT: 40.2 % (ref 39.0–52.0)
Hemoglobin: 13.7 g/dL (ref 13.0–17.0)
MCH: 31 pg (ref 26.0–34.0)
MCHC: 34.1 g/dL (ref 30.0–36.0)
MCV: 91 fL (ref 80.0–100.0)
Platelets: 189 10*3/uL (ref 150–400)
RBC: 4.42 MIL/uL (ref 4.22–5.81)
RDW: 12.8 % (ref 11.5–15.5)
WBC: 6.2 10*3/uL (ref 4.0–10.5)
nRBC: 0 % (ref 0.0–0.2)

## 2018-08-26 LAB — RAPID URINE DRUG SCREEN, HOSP PERFORMED
Amphetamines: NOT DETECTED
Barbiturates: NOT DETECTED
Benzodiazepines: NOT DETECTED
Cocaine: NOT DETECTED
Opiates: NOT DETECTED
Tetrahydrocannabinol: POSITIVE — AB

## 2018-08-26 LAB — HEPATIC FUNCTION PANEL
ALT: <5 U/L (ref 0–44)
AST: 17 U/L (ref 15–41)
Albumin: 4.1 g/dL (ref 3.5–5.0)
Alkaline Phosphatase: 66 U/L (ref 38–126)
Bilirubin, Direct: <0.1 mg/dL (ref 0.0–0.2)
Total Bilirubin: 0.3 mg/dL (ref 0.3–1.2)
Total Protein: 6.7 g/dL (ref 6.5–8.1)

## 2018-08-26 LAB — HEMOGLOBIN A1C
Hgb A1c MFr Bld: 4.6 % — ABNORMAL LOW (ref 4.8–5.6)
Mean Plasma Glucose: 85.32 mg/dL

## 2018-08-26 LAB — ETHANOL: Alcohol, Ethyl (B): <10 mg/dL (ref <10)

## 2018-08-26 MED ORDER — QUETIAPINE FUMARATE 300 MG PO TABS
300.0000 mg | ORAL_TABLET | Freq: Every evening | ORAL | Status: DC | PRN
  Administered 2018-08-26 – 2018-08-29 (×8): 300 mg via ORAL
  Filled 2018-08-26 (×15): qty 1

## 2018-08-26 MED ORDER — MELOXICAM 7.5 MG PO TABS
15.0000 mg | ORAL_TABLET | Freq: Every day | ORAL | Status: DC
  Administered 2018-08-26 – 2018-09-02 (×8): 15 mg via ORAL
  Filled 2018-08-26 (×2): qty 1
  Filled 2018-08-26: qty 2
  Filled 2018-08-26: qty 1
  Filled 2018-08-26 (×4): qty 2
  Filled 2018-08-26: qty 1
  Filled 2018-08-26: qty 2
  Filled 2018-08-26 (×2): qty 1

## 2018-08-26 MED ORDER — PANTOPRAZOLE SODIUM 40 MG PO TBEC
40.0000 mg | DELAYED_RELEASE_TABLET | Freq: Every day | ORAL | Status: DC
  Administered 2018-08-26 – 2018-09-02 (×8): 40 mg via ORAL
  Filled 2018-08-26 (×14): qty 1

## 2018-08-26 MED ORDER — LITHIUM CARBONATE 300 MG PO CAPS
300.0000 mg | ORAL_CAPSULE | Freq: Three times a day (TID) | ORAL | Status: DC
  Administered 2018-08-26 (×2): 300 mg via ORAL
  Filled 2018-08-26 (×8): qty 1

## 2018-08-26 MED ORDER — LITHIUM CARBONATE 300 MG PO CAPS
300.0000 mg | ORAL_CAPSULE | Freq: Every day | ORAL | Status: DC
  Administered 2018-08-27 – 2018-09-02 (×7): 300 mg via ORAL
  Filled 2018-08-26 (×11): qty 1

## 2018-08-26 MED ORDER — CLONIDINE HCL 0.1 MG PO TABS
0.1000 mg | ORAL_TABLET | Freq: Three times a day (TID) | ORAL | Status: DC
  Administered 2018-08-26 – 2018-09-02 (×22): 0.1 mg via ORAL
  Filled 2018-08-26 (×37): qty 1

## 2018-08-26 MED ORDER — GABAPENTIN 300 MG PO CAPS
300.0000 mg | ORAL_CAPSULE | Freq: Three times a day (TID) | ORAL | Status: DC
  Administered 2018-08-26 – 2018-08-28 (×6): 300 mg via ORAL
  Filled 2018-08-26 (×12): qty 1

## 2018-08-26 MED ORDER — SIMVASTATIN 5 MG PO TABS
10.0000 mg | ORAL_TABLET | Freq: Every day | ORAL | Status: DC
  Administered 2018-08-26 – 2018-09-01 (×6): 10 mg via ORAL
  Filled 2018-08-26 (×9): qty 2
  Filled 2018-08-26 (×2): qty 1
  Filled 2018-08-26 (×2): qty 2

## 2018-08-26 MED ORDER — SCOPOLAMINE 1 MG/3DAYS TD PT72
1.0000 | MEDICATED_PATCH | TRANSDERMAL | Status: DC
  Filled 2018-08-26: qty 1

## 2018-08-26 MED ORDER — AMLODIPINE BESYLATE 5 MG PO TABS
5.0000 mg | ORAL_TABLET | Freq: Every day | ORAL | Status: DC
  Administered 2018-08-26 – 2018-09-02 (×8): 5 mg via ORAL
  Filled 2018-08-26 (×13): qty 1

## 2018-08-26 MED ORDER — METOPROLOL TARTRATE 50 MG PO TABS
50.0000 mg | ORAL_TABLET | Freq: Two times a day (BID) | ORAL | Status: DC
  Administered 2018-08-26 – 2018-09-02 (×15): 50 mg via ORAL
  Filled 2018-08-26 (×12): qty 1
  Filled 2018-08-26: qty 2
  Filled 2018-08-26 (×10): qty 1
  Filled 2018-08-26: qty 2
  Filled 2018-08-26 (×2): qty 1

## 2018-08-26 MED ORDER — MELOXICAM 7.5 MG PO TABS
ORAL_TABLET | ORAL | Status: AC
  Administered 2018-08-26: 15 mg via ORAL
  Filled 2018-08-26: qty 1

## 2018-08-26 MED ORDER — DESVENLAFAXINE SUCCINATE ER 50 MG PO TB24
50.0000 mg | ORAL_TABLET | Freq: Every day | ORAL | Status: DC
  Administered 2018-08-26 – 2018-09-02 (×8): 50 mg via ORAL
  Filled 2018-08-26 (×13): qty 1

## 2018-08-26 MED ORDER — BUSPIRONE HCL 5 MG PO TABS
5.0000 mg | ORAL_TABLET | Freq: Three times a day (TID) | ORAL | Status: DC
  Administered 2018-08-26 – 2018-08-30 (×12): 5 mg via ORAL
  Filled 2018-08-26 (×23): qty 1

## 2018-08-26 MED ORDER — ONDANSETRON HCL 4 MG PO TABS
4.0000 mg | ORAL_TABLET | Freq: Three times a day (TID) | ORAL | Status: DC | PRN
  Administered 2018-08-26 – 2018-09-01 (×12): 4 mg via ORAL
  Filled 2018-08-26 (×14): qty 1

## 2018-08-26 MED ORDER — GABAPENTIN 300 MG PO CAPS
ORAL_CAPSULE | ORAL | Status: AC
  Filled 2018-08-26: qty 1

## 2018-08-26 MED ORDER — POLYETHYLENE GLYCOL 3350 17 G PO PACK
17.0000 g | PACK | Freq: Two times a day (BID) | ORAL | Status: DC
  Administered 2018-08-26: 17 g via ORAL
  Filled 2018-08-26 (×23): qty 1

## 2018-08-26 MED ORDER — POLYETHYLENE GLYCOL 3350 17 G PO PACK
17.0000 g | PACK | Freq: Every day | ORAL | Status: DC
  Filled 2018-08-26 (×2): qty 1

## 2018-08-26 MED ORDER — ACETAMINOPHEN 500 MG PO TABS
1000.0000 mg | ORAL_TABLET | Freq: Four times a day (QID) | ORAL | Status: DC | PRN
  Administered 2018-08-26 – 2018-09-01 (×11): 1000 mg via ORAL
  Filled 2018-08-26 (×12): qty 2

## 2018-08-26 MED ORDER — LITHIUM CARBONATE 300 MG PO CAPS
600.0000 mg | ORAL_CAPSULE | Freq: Every day | ORAL | Status: DC
  Administered 2018-08-26 – 2018-09-01 (×7): 600 mg via ORAL
  Filled 2018-08-26 (×13): qty 2

## 2018-08-26 NOTE — ED Notes (Signed)
Bed: [facility]1 Expected date:  Expected time:  Means of arrival:  Comments: 

## 2018-08-26 NOTE — ED Notes (Signed)
Patient was sitting in wheelchair waiting for transport back to [facility] when he gradually fell to the floor and proceeded to flail all extremities, history of psuedo seizures-AC Otila Kluver was informed of patient's behavior, per Otila Kluver patient appears to be med seeking-Dr Eulis Foster informed of behavior and stated patient could return to [facility] notified
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
## 2018-08-26 NOTE — Progress Notes (Signed)
Adult Psychoeducational Group Note  Date:  08/26/2018 Time:  5:02 AM  Group Topic/Focus:  Wrap-Up Group:   The focus of this group is to help patients review their daily goal of treatment and discuss progress on daily workbooks.  Participation Level:  Active  Participation Quality:  Appropriate and Attentive  Affect:  Appropriate  Cognitive:  Alert and Appropriate  Insight: Appropriate and Good  Engagement in Group:  Engaged  Modes of Intervention:  Discussion  Additional Comments:  Pt attend wrap up group. His day was a 4. One positive thing that happen he woke up today to see his child.he need to get his medicine.  Lenice Llamas Long 08/26/2018, 5:02 AM

## 2018-08-26 NOTE — Tx Team (Signed)
Interdisciplinary Treatment and Diagnostic Plan Update  08/26/2018 Time of Session:  Jermaine [name] MRN: [id number]  Principal Diagnosis: <principal problem not specified>  Secondary Diagnoses: Active Problems:   Major depressive disorder, recurrent episode (HCC)   Current Medications:  Current Facility-Administered Medications  Medication Dose Route Frequency Provider Last Rate Last Dose  . gabapentin (NEURONTIN) 300 MG capsule           . acetaminophen (TYLENOL) tablet 650 mg  650 mg Oral Q6H PRN Nwoko, Agnes I, NP      . alum & mag hydroxide-simeth (MAALOX/MYLANTA) 200-200-20 MG/5ML suspension 30 mL  30 mL Oral Q4H PRN Nwoko, Agnes I, NP      . amLODipine (NORVASC) tablet 5 mg  5 mg Oral Daily Sharma Covert, MD   5 mg at 08/26/18 0934  . busPIRone (BUSPAR) tablet 5 mg  5 mg Oral TID Sharma Covert, MD   5 mg at 08/26/18 0934  . cloNIDine (CATAPRES) tablet 0.1 mg  0.1 mg Oral TID Sharma Covert, MD   0.1 mg at 08/26/18 0935  . desvenlafaxine (PRISTIQ) 24 hr tablet 50 mg  50 mg Oral Daily Sharma Covert, MD   50 mg at 08/26/18 0934  . gabapentin (NEURONTIN) capsule 300 mg  300 mg Oral TID Sharma Covert, MD      . hydrOXYzine (ATARAX/VISTARIL) tablet 25 mg  25 mg Oral Q6H PRN Lindell Spar I, NP   25 mg at 08/25/18 2204  . [START ON 08/27/2018] lithium carbonate capsule 300 mg  300 mg Oral Daily Sharma Covert, MD      . lithium carbonate capsule 600 mg  600 mg Oral Q supper Sharma Covert, MD      . magnesium hydroxide (MILK OF MAGNESIA) suspension 30 mL  30 mL Oral Daily PRN Lindell Spar I, NP      . meloxicam (MOBIC) tablet 15 mg  15 mg Oral Daily Sharma Covert, MD   15 mg at 08/26/18 1348  . metoprolol tartrate (LOPRESSOR) tablet 50 mg  50 mg Oral BID Sharma Covert, MD   50 mg at 08/26/18 0934  . nicotine (NICODERM CQ - dosed in mg/24 hours) patch 21 mg  21 mg Transdermal Q0600 Lindell Spar I, NP   21 mg at 08/26/18 0826  . ondansetron (ZOFRAN)  tablet 4 mg  4 mg Oral Q8H PRN Sharma Covert, MD   4 mg at 08/26/18 1348  . pantoprazole (PROTONIX) EC tablet 40 mg  40 mg Oral Daily Sharma Covert, MD   40 mg at 08/26/18 0934  . polyethylene glycol (MIRALAX / GLYCOLAX) packet 17 g  17 g Oral BID Sharma Covert, MD      . QUEtiapine (SEROQUEL) tablet 300 mg  300 mg Oral QHS,MR X 1 Clary, Cordie Grice, MD      . simvastatin (ZOCOR) tablet 10 mg  10 mg Oral q1800 Sharma Covert, MD      . traZODone (DESYREL) tablet 50 mg  50 mg Oral QHS PRN Lindell Spar I, NP   50 mg at 08/25/18 2204   PTA Medications: Medications Prior to Admission  Medication Sig Dispense Refill Last Dose  . amLODipine (NORVASC) 5 MG tablet Take 5 mg by mouth daily.   3 Taking  . busPIRone (BUSPAR) 5 MG tablet Take 5 mg by mouth 3 (three) times daily.   Taking  . cloNIDine (CATAPRES) 0.1 MG tablet Take 0.1 mg by  mouth 3 (three) times daily.   0 Taking  . hydrOXYzine (VISTARIL) 25 MG capsule Take 25 mg by mouth 3 (three) times daily as needed for anxiety.   0 Taking  . linaclotide (LINZESS) 290 MCG CAPS capsule Take 1 capsule (290 mcg total) by mouth daily. 30 capsule 5   . lithium carbonate 300 MG capsule Take 1 capsule (300 mg total) by mouth 3 (three) times daily with meals. (Patient taking differently: Take 300 mg by mouth See admin instructions. Take 1 capsule in the morning and 2 capsules at night.) 90 capsule 0 Taking  . metoprolol tartrate (LOPRESSOR) 50 MG tablet Take 1 tablet (50 mg total) by mouth 2 (two) times daily. 60 tablet 0 Taking  . nicotine (NICODERM CQ - DOSED IN MG/24 HOURS) 21 mg/24hr patch Place 1 patch onto the skin daily.  4   . omeprazole (PRILOSEC) 20 MG capsule Take 20 mg by mouth daily.   0 Taking  . ondansetron (ZOFRAN) 4 MG tablet Take 4 mg by mouth every 4 (four) hours as needed.   3 Taking  . polyethylene glycol powder (GLYCOLAX/MIRALAX) powder Take one capful (17 grams) twice daily. 527 g 3   . promethazine (PHENERGAN) 25 MG  suppository Place 1 suppository (25 mg total) rectally every 6 (six) hours as needed for nausea or vomiting. 12 each 0 Taking  . promethazine (PHENERGAN) 25 MG tablet Take 25 mg by mouth every 6 (six) hours as needed for nausea or vomiting.   Taking  . QUEtiapine (SEROQUEL) 300 MG tablet Take 300 mg by mouth at bedtime.    Taking  . simvastatin (ZOCOR) 10 MG tablet Take 10 mg by mouth daily.   Taking  . TRANSDERM-SCOP, 1.5 MG, 1 MG/3DAYS Place 1 patch onto the skin every 3 (three) days.   2 Taking  . trazodone (DESYREL) 300 MG tablet Take 150 mg by mouth at bedtime.    Taking    Patient Stressors: Financial difficulties Health problems Occupational concerns Substance abuse  Patient Strengths: Ability for insight Active sense of humor Average or above average intelligence Capable of independent living Communication skills General fund of knowledge Motivation for treatment/growth Physical Health Supportive family/friends  Treatment Modalities: Medication Management, Group therapy, Case management,  1 to 1 session with clinician, Psychoeducation, Recreational therapy.   Physician Treatment Plan for Primary Diagnosis: <principal problem not specified> Long Term Goal(s): Improvement in symptoms so as ready for discharge Improvement in symptoms so as ready for discharge   Short Term Goals: Ability to identify changes in lifestyle to reduce recurrence of condition will improve Ability to verbalize feelings will improve Ability to disclose and discuss suicidal ideas Ability to demonstrate self-control will improve Ability to identify and develop effective coping behaviors will improve Ability to maintain clinical measurements within normal limits will improve Ability to identify changes in lifestyle to reduce recurrence of condition will improve Ability to verbalize feelings will improve Ability to disclose and discuss suicidal ideas Ability to demonstrate self-control will  improve Ability to identify and develop effective coping behaviors will improve Ability to maintain clinical measurements within normal limits will improve  Medication Management: Evaluate patient's response, side effects, and tolerance of medication regimen.  Therapeutic Interventions: 1 to 1 sessions, Unit Group sessions and Medication administration.  Evaluation of Outcomes: Not Met  Physician Treatment Plan for Secondary Diagnosis: Active Problems:   Major depressive disorder, recurrent episode ([place])  Long Term Goal(s): Improvement in symptoms so as ready for discharge Improvement in  symptoms so as ready for discharge   Short Term Goals: Ability to identify changes in lifestyle to reduce recurrence of condition will improve Ability to verbalize feelings will improve Ability to disclose and discuss suicidal ideas Ability to demonstrate self-control will improve Ability to identify and develop effective coping behaviors will improve Ability to maintain clinical measurements within normal limits will improve Ability to identify changes in lifestyle to reduce recurrence of condition will improve Ability to verbalize feelings will improve Ability to disclose and discuss suicidal ideas Ability to demonstrate self-control will improve Ability to identify and develop effective coping behaviors will improve Ability to maintain clinical measurements within normal limits will improve     Medication Management: Evaluate patient's response, side effects, and tolerance of medication regimen.  Therapeutic Interventions: 1 to 1 sessions, Unit Group sessions and Medication administration.  Evaluation of Outcomes: Not Met   RN Treatment Plan for Primary Diagnosis: <principal problem not specified> Long Term Goal(s): Knowledge of disease and therapeutic regimen to maintain health will improve  Short Term Goals: Ability to demonstrate self-control, Ability to participate in decision making  will improve, Ability to verbalize feelings will improve, Ability to disclose and discuss suicidal ideas and Ability to identify and develop effective coping behaviors will improve  Medication Management: RN will administer medications as ordered by provider, will assess and evaluate patient's response and provide education to patient for prescribed medication. RN will report any adverse and/or side effects to prescribing provider.  Therapeutic Interventions: 1 on 1 counseling sessions, Psychoeducation, Medication administration, Evaluate responses to treatment, Monitor vital signs and CBGs as ordered, Perform/monitor CIWA, COWS, AIMS and Fall Risk screenings as ordered, Perform wound care treatments as ordered.  Evaluation of Outcomes: Not Met   LCSW Treatment Plan for Primary Diagnosis: <principal problem not specified> Long Term Goal(s): Safe transition to appropriate next level of care at discharge, Engage patient in therapeutic group addressing interpersonal concerns.  Short Term Goals: Engage patient in aftercare planning with referrals and resources  Therapeutic Interventions: Assess for all discharge needs, 1 to 1 time with Social worker, Explore available resources and support systems, Assess for adequacy in community support network, Educate family and significant other(s) on suicide prevention, Complete Psychosocial Assessment, Interpersonal group therapy.  Evaluation of Outcomes: Not Met   Progress in Treatment: Attending groups: No. Participating in groups: No. Taking medication as prescribed: No. Toleration medication: Yes. and No. Family/Significant other contact made: No, will contact:  the patient's wife Patient understands diagnosis: Yes. Discussing patient identified problems/goals with staff: Yes. Medical problems stabilized or resolved: Yes. Denies suicidal/homicidal ideation: Yes. Issues/concerns per patient self-inventory: No. Other:   New problem(s) identified:  None   New Short Term/Long Term Goal(s): medication stabilization, elimination of SI thoughts, development of comprehensive mental wellness plan.    Patient Goals:  Help with depression  Discharge Plan or Barriers: Patient plans to return home with his wife and children at discharge. Patient reports he would like to be referred to a psychiatrist for medication management once he is discharged from the hospital. CSW will assess for appropriate referrals and continue follow for any additional discharge plans.   Reason for Continuation of Hospitalization: Anxiety Depression Medication stabilization Suicidal ideation  Estimated Length of Stay: 3-5 days   Attendees: Patient: 08/26/2018 2:38 PM  Physician: Dr. Myles Lipps, MD 08/26/2018 2:38 PM  Nursing: Chong [email or web address].D, RN 08/26/2018 2:38 PM  RN Care Manager: Lars Pinks, RN 08/26/2018 2:38 PM  Social Worker: Radonna Ricker, [place] 08/26/2018 2:38 PM  Recreational Therapist:  08/26/2018 2:38 PM  Other: Agustina Caroli, NP 08/26/2018 2:38 PM  Other:  08/26/2018 2:38 PM  Other: 08/26/2018 2:38 PM    Scribe for Treatment Team: [name], [place] 08/26/2018 2:38 PM
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
## 2018-08-26 NOTE — Plan of Care (Signed)
  Problem: Activity: Goal: Interest or engagement in activities will improve Outcome: Progressing   Problem: Safety: Goal: Ability to remain free from injury will improve Outcome: Progressing  DAR NOTE: Patient presents with anxious affect and irritable mood.  Denies suicidal thoughts,, auditory and visual hallucinations.  Patient reports multiple somatic complaint to different nursing staff throughout the day.  Demanding and asking staff for different things.  Maintained on routine safety checks.  Medications given as prescribed.  Support and encouragement offered as needed.  Patient observed socializing with peers in the dayroom.  Patient is safe on and off the unit.  Patient returned from the gym stating his right knee hurts after playing basketball.  Requested and was given pain medication with good effect.  Patient observed walking on the unit without difficulty.  Later he was requesting for a walker/wheel chair to assist with ambulation.  DG view of knee ordered.  Patient transferred to radiology for Xray.

## 2018-08-27 DIAGNOSIS — E785 Hyperlipidemia, unspecified: Secondary | ICD-10-CM

## 2018-08-27 DIAGNOSIS — F419 Anxiety disorder, unspecified: Secondary | ICD-10-CM

## 2018-08-27 DIAGNOSIS — R45851 Suicidal ideations: Secondary | ICD-10-CM

## 2018-08-27 DIAGNOSIS — K219 Gastro-esophageal reflux disease without esophagitis: Secondary | ICD-10-CM

## 2018-08-27 DIAGNOSIS — F333 Major depressive disorder, recurrent, severe with psychotic symptoms: Secondary | ICD-10-CM

## 2018-08-27 DIAGNOSIS — G4700 Insomnia, unspecified: Secondary | ICD-10-CM

## 2018-08-27 LAB — PROLACTIN: Prolactin: 30.2 ng/mL — ABNORMAL HIGH (ref 4.0–15.2)

## 2018-08-27 NOTE — Progress Notes (Signed)
[facility] Notes:  (Nursing/MHT/Case Management/Adjunct)  Date:  08/27/2018  Time:  2030  Type of Therapy:  wrap up group  Participation Level:  Active  Participation Quality:  Appropriate, Attentive, Sharing and Supportive  Affect:  Appropriate  Cognitive:  Appropriate  Insight:  Improving  Engagement in Group:  Engaged  Modes of Intervention:  Clarification, Education and Support  Summary of Progress/Problems: Pt shared that his good for the day was talking to his two year old son on the telephone. If patient could change any one thing about his life it would be all of the mental disorders he suffers from and all of the medication he has to take for them. Pt shared that he is grateful for his wife and 4 children aged 75-15.   Shellia Cleverly 08/27/2018, 9:30 PM
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
## 2018-08-27 NOTE — Progress Notes (Signed)
08/27/18  11:20AM TEVION LAFORGE MRN: [id number]  Complaint about patients language use was brought to staff attention. After consulting patient it was revealed that the patient was using the word "faggot" in an offensive way. Patient was informed to be careful of his language, and why he should be.    Ronni Rumble MHT

## 2018-08-27 NOTE — Progress Notes (Signed)
D Pt is observed OOB UAL using wheelchair, tolerating well today. His affect is flat, depressed and he demonstrates attention -seeking behaviors as evidenced by pt presenting to this writer and saying " I want to speak to management about the lack of gatorade in the building".      A Pt completed his daily assessment and on this he wrote he deneid SI today and he rated his depression, hopelessness and anxiety " 8/8/8/", respectively. He demonstrated monopolizing behaviors in [employer] group today as evidenced by him talking in hte group about " my issue with my kids is..." and " I don't have a problem with that, so much as it is ....". When group leader thanked him for his input and comments and requested he speak with group leader privately after group was over,  he looked at other group members, rolled his eyes and said  " well alrighty  Then ......", in an exagerrated voice. He refused to  Engage with this Probation officer the remainder of the group. Pt overheard making statements : " that  guy's a queer..he is SO gay...that's disgusting". Writer asked pt to pay attention to his verbage and not be judgemental and deragotory in his conversation about other.     R Safety is in place. Pt noted to ambulate x3. Safety in place.     R

## 2018-08-27 NOTE — BHH Group Notes (Signed)
LCSW Group Therapy Note  08/27/2018    10:00-11:00am   Type of Therapy and Topic:  Group Therapy: Early Messages Received About Anger  Participation Level:  Did Not Attend   Description of Group:   In this group, patients shared and discussed the early messages received in their lives about anger through parental or other adult modeling, teaching, repression, punishment, violence, and more.  Participants identified how those childhood lessons influence even now how they usually or often react when angered.  The group discussed that anger is a secondary emotion and what may be the underlying emotional themes that come out through anger outbursts or that are ignored through anger suppression.  Finally, as a group there was a conversation about the workbook's quote that "There is nothing wrong with anger; it is just a sign something needs to change."     Therapeutic Goals: 1. Patients will identify one or more childhood message about anger that they received and how it was taught to them. 2. Patients will discuss how these childhood experiences have influenced and continue to influence their own expression or repression of anger even today. 3. Patients will explore possible primary emotions that tend to fuel their secondary emotion of anger. 4. Patients will learn that anger itself is normal and cannot be eliminated, and that healthier coping skills can assist with resolving conflict rather than worsening situations.   Summary of Patient Progress:  The patient arrived with only 2 minutes of group left, and immediately started talking about things he did not want to see on a family show.  CSW attempts to redirect him to the group topic of anger were successful short-term only, as shortly after group ended another patient became agitated about things he was saying and required staff intervention.  Therapeutic Modalities:   Cognitive Behavioral Therapy Motivation Interviewing  Maretta Los   .

## 2018-08-27 NOTE — Progress Notes (Signed)
D: Patient observed out and visible in the milieu this evening. At NS frequently with requests of staff. Patient states, "I fell at rec today and then I went to get my knee looked at. While I was there I fell out of my chair and had a seizure. The ED staff just put me in a wheelchair and sent me back. Why kind of care is that?" This is in conflict with staff report as there was no fall or injury during rec time and patient was observed ambulating without difficulty after rec time. Review of notes from ED are not consistent with patient report of a seizure. Patient blaming others, negatively focused. Lists complaints, specifically surrounding medications. Says pain is uncontrolled, doesn't understand why his neurontin and lithium are prescribed as such.  Patient's affect animated, mood irritable. Rates generalized pain from "seizure" at a 5-6/10, no other physical complaints. Patient disputes chart that EEG was negative. "There was another EEG I had and it confirmed a seizure disorder."   A: Medicated per orders, prn tylenol given for discomfort along with trazadone for sleep. Medication education provided. Informed patient lithium level would be drawn in the morning which will guide MD in dosing. Strongly encouraged patient to make a list of items he would like to discuss with Dr. Mallie Darting. Level III obs in place for safety. Emotional support offered. Patient encouraged to complete Suicide Safety Plan before discharge. Encouraged to attend and participate in unit programming.  Fall prevention plan in place and reviewed with patient as pt is a high fall risk due to impulsive and attention seeking behavior.   R: Patient verbalizes understanding of POC, falls prevention education. On reassess, patient is asleep. Patient denies SI/HI/AVH and remains safe on level III obs. Will continue to monitor throughout the night.

## 2018-08-27 NOTE — BHH Suicide Risk Assessment (Signed)
[facility] INPATIENT:  Family/Significant Other Suicide Prevention Education  Suicide Prevention Education:  Education Completed; Wife, Isaid Salvia [phone]),  (name of family member/significant other) has been identified by the patient as the family member/significant other with whom the patient will be residing, and identified as the person(s) who will aid the patient in the event of a mental health crisis (suicidal ideations/suicide attempt).  With written consent from the patient, the family member/significant other has been provided the following suicide prevention education, prior to the and/or following the discharge of the patient.  The suicide prevention education provided includes the following:  Suicide risk factors  Suicide prevention and interventions  National Suicide Hotline telephone number  [facility] assessment telephone number  [facility] Emergency Assistance [place] and/or Residential Mobile Crisis Unit telephone number  Request made of family/significant other to:  Remove weapons (e.g., guns, rifles, knives), all items previously/currently identified as safety concern.    Remove drugs/medications (over-the-counter, prescriptions, illicit drugs), all items previously/currently identified as a safety concern.  The family member/significant other verbalizes understanding of the suicide prevention education information provided.  The family member/significant other agrees to remove the items of safety concern listed above.   Reports he has access to wife's deceased grandmothers gun but reports there are no bullets. Reports she will try to find a lock case or someone to hold on to the gun. Concerns that if he does not get relief for his colon than "he will put a bullet in his head". Has been to [place] for seizure testing. Concerned about his mental state because he just wants them to take his colon out completely. We lost our house  and car so that takes the ability to get to doctors appointments away. He has a history of alcohol abuse and heroin, he was really mean and I kicked him out. He has a 37 year old with me and then a 37 year old. People kept saying they would not take him to the hospital but finally the neighbor took him. I carry all the responsibility in the home. I feel like he was on too much sleeping medication before and I don't want him to relapse and I want him to be the person that he was before. It's hard for me to understand because I have never struggled with it myself. Reports she is open to anyone calling her and being a part of the discharge plan for patient.   Tye Savoy 08/27/2018, 4:26 PM

## 2018-08-27 NOTE — Plan of Care (Signed)
  Problem: Spiritual Needs Goal: Ability to function at adequate level 08/27/2018 1513 by Sharma Covert, RN Outcome: Progressing 08/27/2018 0943 by Sharma Covert, RN Outcome: Not Progressing

## 2018-08-27 NOTE — BHH Group Notes (Signed)
Adult Psychoeducational Group Note  Date:  08/27/2018 Time:  1:15PM  Group Topic/Focus: Cognitive Behavioral Therapy Managing Feelings:   The focus of this group is to identify what feelings patients have difficulty handling and develop a plan to handle them in a healthier way upon discharge.  Participation Level:  Active  Participation Quality:  Attentive, Redirectable and Sharing  Affect:  Flat  Cognitive:  Alert and Lacking  Insight: Limited  Engagement in Group:  Distracting, Engaged and Off Topic  Modes of Intervention:  Discussion, Education, Exploration and Socialization  Additional Comments:  Pt attended group and participated but was distracted by leaving group several times. Pt was also off-topic during discussion, and interrupting group with questions non-related to the topic. However, pt was redirectable.  Harriet Masson 08/27/2018, 2:56 PM

## 2018-08-27 NOTE — Plan of Care (Signed)
  Problem: Education: Goal: Knowledge of [facility] Education information/materials will improve Outcome: Progressing   Problem: Coping: Goal: Ability to verbalize frustrations and anger appropriately will improve Outcome: Progressing   

## 2018-08-27 NOTE — Plan of Care (Signed)
  Problem: Spiritual Needs Goal: Ability to function at adequate level Outcome: Not Progressing   Problem: Education: Goal: Knowledge of [facility] Education information/materials will improve Outcome: Not Progressing Goal: Emotional status will improve Outcome: Not Progressing Goal: Mental status will improve Outcome: Not Progressing Goal: Verbalization of understanding the information provided will improve Outcome: Not Progressing   Problem: Activity: Goal: Interest or engagement in activities will improve Outcome: Not Progressing Goal: Sleeping patterns will improve Outcome: Not Progressing   Problem: Coping: Goal: Ability to verbalize frustrations and anger appropriately will improve Outcome: Not Progressing Goal: Ability to demonstrate self-control will improve Outcome: Not Progressing   Problem: Health Behavior/Discharge Planning: Goal: Identification of resources available to assist in meeting health care needs will improve Outcome: Not Progressing Goal: Compliance with treatment plan for underlying cause of condition will improve Outcome: Not Progressing   Problem: Education: Goal: Knowledge of disease or condition will improve Outcome: Not Progressing Goal: Understanding of discharge needs will improve Outcome: Not Progressing   Problem: Safety: Goal: Ability to remain free from injury will improve Outcome: Not Progressing   Problem: Education: Goal: Ability to state activities that reduce stress will improve Outcome: Not Progressing   Problem: Coping: Goal: Ability to identify and develop effective coping behavior will improve Outcome: Not Progressing   Problem: Education: Goal: Utilization of techniques to improve thought processes will improve Outcome: Not Progressing Goal: Knowledge of the prescribed therapeutic regimen will improve Outcome: Not Progressing   Problem: Coping: Goal: Coping ability will improve Outcome: Not  Progressing Goal: Will verbalize feelings Outcome: Not Progressing   Problem: Education: Goal: Ability to make informed decisions regarding treatment will improve Outcome: Not Progressing   Problem: Coping: Goal: Coping ability will improve Outcome: Not Progressing   Problem: Self-Concept: Goal: Ability to disclose and discuss suicidal ideas will improve Outcome: Not Progressing Goal: Will verbalize positive feelings about self Outcome: Not Progressing

## 2018-08-28 LAB — T4, FREE: Free T4: 0.71 ng/dL — ABNORMAL LOW (ref 0.82–1.77)

## 2018-08-28 LAB — GLUCOSE, CAPILLARY: Glucose-Capillary: 107 mg/dL — ABNORMAL HIGH (ref 70–99)

## 2018-08-28 LAB — LITHIUM LEVEL: Lithium Lvl: 0.54 mmol/L — ABNORMAL LOW (ref 0.60–1.20)

## 2018-08-28 MED ORDER — IBUPROFEN 800 MG PO TABS
800.0000 mg | ORAL_TABLET | Freq: Once | ORAL | Status: AC
  Administered 2018-08-28: 800 mg via ORAL
  Filled 2018-08-28 (×2): qty 1

## 2018-08-28 MED ORDER — GABAPENTIN 400 MG PO CAPS
400.0000 mg | ORAL_CAPSULE | Freq: Three times a day (TID) | ORAL | Status: DC
  Administered 2018-08-28 – 2018-08-29 (×3): 400 mg via ORAL
  Filled 2018-08-28 (×10): qty 1

## 2018-08-28 MED ORDER — ACETAMINOPHEN 325 MG PO TABS
650.0000 mg | ORAL_TABLET | Freq: Once | ORAL | Status: AC
  Administered 2018-08-28: 650 mg via ORAL
  Filled 2018-08-28: qty 2

## 2018-08-28 MED ORDER — LORAZEPAM 2 MG/ML IJ SOLN
INTRAMUSCULAR | Status: AC
  Administered 2018-08-28: 2 mg via INTRAMUSCULAR
  Filled 2018-08-28: qty 1

## 2018-08-28 NOTE — ED Notes (Signed)
Pt d/ced back to [facility] with nurse tech Delicia per [place]

## 2018-08-28 NOTE — ED Notes (Signed)
bhh tech at bedside, report called to [place]

## 2018-08-28 NOTE — BHH Group Notes (Signed)
BHH LCSW Group Therapy Note  08/28/2018  10:00-11:00AM  Type of Therapy and Topic:  Group Therapy:  Adding Supports Including Being Your Own Support  Participation Level:  Did Not Attend   Description of Group:  Patients in this group were introduced to the concept that additional supports including self-support are an essential part of recovery.  A song entitled "I Need Help!" was played and a group discussion was held in reaction to the idea of needing to add supports.  A song entitled "My Own Hero" was played and a group discussion ensued in which patients stated they could relate to the song and it inspired them to realize they have be willing to help themselves in order to succeed, because other people cannot achieve sobriety or stability for them.  We discussed adding a variety of healthy supports to address the various needs in their lives.  A song was played called "I Know Where I've Been" toward the end of group and used to conduct an inspirational wrap-up to group of remembering how far they have already come in their journey.  Therapeutic Goals: 1)  demonstrate the importance of being a part of one's own support system 2)  discuss reasons people in one's life may eventually be unable to be continually supportive  3)  identify the patient's current support system and   4)  elicit commitments to add healthy supports and to become more conscious of being self-supportive   Summary of Patient Progress:  N/A   Therapeutic Modalities:   Motivational Interviewing Activity  Madia Carvell J Grossman-Orr       

## 2018-08-28 NOTE — Plan of Care (Signed)
Nurse discussed anxiety, depression, coping skills with patient. 
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
## 2018-08-28 NOTE — ED Notes (Signed)
ED Provider at bedside. Dr. Pickering °

## 2018-08-28 NOTE — ED Triage Notes (Signed)
Pt has been transferred from inpatient [facility] for evaluation of seizure-like activity. Reportedly has demonstrated pseudoseizure behavior and received 2mg  of IM ativan prior to transfer. EMS also reported "organized seizure-like activity en route.

## 2018-08-28 NOTE — ED Notes (Signed)
Bed: TM62 Expected date:  Expected time:  Means of arrival:  Comments: EMS-from [facility] ?seizure

## 2018-08-28 NOTE — Progress Notes (Signed)
Patient was sitting in wheelchair in dayroom.  Patient was on floor, BP 134/112 P101.  Dr. Parke Poisson, Sharyn Blitz were with patient.  Patient experienced convulsions.  EMS was called and patient sent to [facility] ED for evaluation.  Patient sat back in wheelchair, and then laid on the gurney when EMS arrived.  Nurse called wife Jermaine [name] phone [phone] and explained situation to wife and that husband was on his way to [facility] ED for evaluation. Ativan 2 mg IM L arm given patient per MD verbal order. Respirations even and unlabored.  No signs/symptoms of pain/distress noted on patient's face/body movements while EMS was at [facility].

## 2018-08-28 NOTE — Progress Notes (Addendum)
D:  Patient's self inventory sheet, patient has poor sleep, sleep medication not helpful.  Fair appetite, low energy level, poor concentration.  Rated depression, hopeless and anxiety 8.  Denied withdrawals.  SI sometimes.  Contracts for safety.  Physical problems, pain.  Physical pain, worst pain #6 in past 24 hours.  Pain medicine not helpful.  Goal is discharge plan.  Plans to "think".  No discharge plans. A:  Medications administered per MD orders.  Emotional support and encouragement given patient. R:  Denied SI and HI while talking to nurse this morning, contracts for safety. Patient wrote SI on self inventory sheet this morning.   Denied A/V hallucinations.  Safety maintained with 15 minute checks.

## 2018-08-28 NOTE — Progress Notes (Signed)
Writer has observed patient up in the dayroom watching the football game with peers. He shared with Probation officer about his seizure disorder and how he slipped and fell in the gym. He was offered heat/cold packs for his knee but declined. He was complaint with meds, Support given and safety maintained on unit with 15 min checks.
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
## 2018-08-29 LAB — T3, FREE: T3, Free: 2.7 pg/mL (ref 2.0–4.4)

## 2018-08-29 LAB — RAPID URINE DRUG SCREEN, HOSP PERFORMED
Amphetamines: NOT DETECTED
Barbiturates: NOT DETECTED
Benzodiazepines: NOT DETECTED
Cocaine: NOT DETECTED
Opiates: NOT DETECTED
Tetrahydrocannabinol: NOT DETECTED

## 2018-08-29 MED ORDER — NICOTINE POLACRILEX 2 MG MT GUM
2.0000 mg | CHEWING_GUM | OROMUCOSAL | Status: DC | PRN
  Administered 2018-08-29 – 2018-09-02 (×17): 2 mg via ORAL
  Filled 2018-08-29 (×5): qty 1

## 2018-08-29 MED ORDER — GABAPENTIN 300 MG PO CAPS
600.0000 mg | ORAL_CAPSULE | Freq: Three times a day (TID) | ORAL | Status: DC
  Administered 2018-08-29 – 2018-09-02 (×13): 600 mg via ORAL
  Filled 2018-08-29 (×18): qty 2

## 2018-08-29 MED ORDER — OXCARBAZEPINE 150 MG PO TABS
150.0000 mg | ORAL_TABLET | Freq: Two times a day (BID) | ORAL | Status: DC
  Administered 2018-08-29 – 2018-09-02 (×9): 150 mg via ORAL
  Filled 2018-08-29 (×14): qty 1
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
## 2018-08-29 NOTE — Progress Notes (Signed)
Recreation Therapy Notes  Date: 12.9.19 Time: 0930 Location: 300 [address] Dayroom  Group Topic: Stress Management  Goal Area(s) Addresses:  Patient will verbalize importance of using healthy stress management.  Patient will identify positive emotions associated with healthy stress management.   Intervention: Stress Management  Activity :  Guided Imagery.  LRT introduced the stress management technique of guided imagery.  LRT read a script for patients to visualize being at the beach.  Patients were to follow along as script was read.  Education:  Stress Management, Discharge Planning.   Education Outcome: Acknowledges edcuation/In group clarification offered/Needs additional education  Clinical Observations/Feedback: Pt did not attend activity.     Victorino Sparrow, LRT/CTRS         Victorino Sparrow A 08/29/2018 12:28 PM

## 2018-08-29 NOTE — Progress Notes (Signed)
Adult Psychoeducational Group Note  Date:  08/29/2018 Time:  8:59 PM  Group Topic/Focus:  Wrap-Up Group:   The focus of this group is to help patients review their daily goal of treatment and discuss progress on daily workbooks.  Participation Level:  Active  Participation Quality:  Appropriate  Affect:  Appropriate  Cognitive:  Appropriate  Insight: Appropriate  Engagement in Group:  Engaged  Modes of Intervention:  Discussion  Additional Comments:  Pt stated he did not set any goals.  Pt did speak with doctor today.  Pt rated the day at a 3/10.  Jermaine [name] 08/29/2018, 8:59 PM

## 2018-08-29 NOTE — BHH Group Notes (Signed)
LCSW Group Therapy Note 08/29/2018 12:52 PM  Type of Therapy and Topic: Group Therapy: Overcoming Obstacles  Participation Level: Minimal  Description of Group:  In this group patients will be encouraged to explore what they see as obstacles to their own wellness and recovery. They will be guided to discuss their thoughts, feelings, and behaviors related to these obstacles. The group will process together ways to cope with barriers, with attention given to specific choices patients can make. Each patient will be challenged to identify changes they are motivated to make in order to overcome their obstacles. This group will be process-oriented, with patients participating in exploration of their own experiences as well as giving and receiving support and challenge from other group members.  Therapeutic Goals: 1. Patient will identify personal and current obstacles as they relate to admission. 2. Patient will identify barriers that currently interfere with their wellness or overcoming obstacles.  3. Patient will identify feelings, thought process and behaviors related to these barriers. 4. Patient will identify two changes they are willing to make to overcome these obstacles:   Summary of Patient Progress  Jermaine [name] reports that his main goal is his health. He reports that he suffers from seizures, however his doctors do not know what form of treatment they want to use to decrease or eliminate his symptoms, which is frustrating.    Therapeutic Modalities:  Cognitive Behavioral Therapy Solution Focused Therapy Motivational Interviewing Relapse Prevention Therapy   Theresa Duty Clinical Social Worker

## 2018-08-29 NOTE — Progress Notes (Signed)
Writer received report from [employer] before patient arrived back from the ED. Patient requesting pain medication and zofran immediately upon arrival here. Writer informed him of medications given at [facility] and informed him of the time frame before next doses due. Patient has been constantly asking for medication for pain but will not lie down and rest as Probation officer suggested. Patient asked about what could possibly be causing his seizures and writer informed him that he may need to follow up with a doctor after discharge to address this issue. Patient has been up in the dayroom watching tv. Safety maintained on unit with 15 min checks.

## 2018-08-29 NOTE — Progress Notes (Signed)
Patient ID: Jermaine [name], male   DOB: 07-Aug-1981, 37 y.o.   MRN: [id number]  D. Pt presents with a anxious affect and depressed behavior. Pt reports ongoing pain from the "4 seizures I had yesterday." Pt reports to nurse that he used to take Subutex but has not taken any in 3-4 weeks because he stopped to have a "colon test" done.   A. Labs and vitals monitored. Pt given and educated on medications. Pt supported emotionally and encouraged to express concerns and ask questions.  Pt offered alternative therapies for pain, refused.   R. Pt remains safe with 15 minute checks. Pt endorses ongoing SI with a plan to "blow my brains out with my gun." Pt reports access to a gun at home. When asked if someone could put gun away, pt states "I think my wife already has." Pt currently denies HI and AVH and agrees to contact staff before acting on any harmful thoughts. Will continue POC.

## 2018-08-30 MED ORDER — QUETIAPINE FUMARATE 300 MG PO TABS
300.0000 mg | ORAL_TABLET | Freq: Every day | ORAL | Status: DC
  Administered 2018-08-30: 300 mg via ORAL
  Filled 2018-08-30 (×2): qty 1

## 2018-08-30 NOTE — Progress Notes (Signed)
Patient ID: Jermaine [name], male   DOB: May 24, 1981, 37 y.o.   MRN: [id number]   D: Patient in dayroom tonight interacting with peers. Joking around and laughing. When staff talks to him he still has a depressed mood with passive SI. No weakness noted tonight thus far. Drinking Gatorade x 2 tonight and very steady on feet. Patient on high doses of medications with no sedation. A: Staff will continue to monitor on q 15 minute checks, follow treatment plan, and give medications as ordered. R: Cooperative on the unit.
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
## 2018-08-30 NOTE — Progress Notes (Signed)
Did not attend group 
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
## 2018-08-30 NOTE — Tx Team (Signed)
Interdisciplinary Treatment and Diagnostic Plan Update  08/30/2018 Time of Session:  Jermaine [name] MRN: [id number]  Principal Diagnosis: <principal problem not specified>  Secondary Diagnoses: Active Problems:   Major depressive disorder, recurrent episode ([place])   Current Medications:  Current Facility-Administered Medications  Medication Dose Route Frequency Provider Last Rate Last Dose  . acetaminophen (TYLENOL) tablet 1,000 mg  1,000 mg Oral Q6H PRN Laverle Hobby, PA-C   1,000 mg at 08/30/18 1216  . alum & mag hydroxide-simeth (MAALOX/MYLANTA) 200-200-20 MG/5ML suspension 30 mL  30 mL Oral Q4H PRN Nwoko, Agnes I, NP      . amLODipine (NORVASC) tablet 5 mg  5 mg Oral Daily Sharma Covert, MD   5 mg at 08/30/18 0817  . cloNIDine (CATAPRES) tablet 0.1 mg  0.1 mg Oral TID Sharma Covert, MD   0.1 mg at 08/30/18 1213  . desvenlafaxine (PRISTIQ) 24 hr tablet 50 mg  50 mg Oral Daily Sharma Covert, MD   50 mg at 08/30/18 0817  . gabapentin (NEURONTIN) capsule 600 mg  600 mg Oral TID Sharma Covert, MD   600 mg at 08/30/18 1213  . hydrOXYzine (ATARAX/VISTARIL) tablet 25 mg  25 mg Oral Q6H PRN Lindell Spar I, NP   25 mg at 08/28/18 2153  . lithium carbonate capsule 300 mg  300 mg Oral Daily Sharma Covert, MD   300 mg at 08/30/18 0817  . lithium carbonate capsule 600 mg  600 mg Oral Q supper Sharma Covert, MD   600 mg at 08/29/18 1705  . magnesium hydroxide (MILK OF MAGNESIA) suspension 30 mL  30 mL Oral Daily PRN Nwoko, Agnes I, NP      . meloxicam (MOBIC) tablet 15 mg  15 mg Oral Daily Sharma Covert, MD   15 mg at 08/30/18 0816  . metoprolol tartrate (LOPRESSOR) tablet 50 mg  50 mg Oral BID Sharma Covert, MD   50 mg at 08/30/18 0818  . nicotine polacrilex (NICORETTE) gum 2 mg  2 mg Oral PRN Sharma Covert, MD   2 mg at 08/30/18 0931  . ondansetron (ZOFRAN) tablet 4 mg  4 mg Oral Q8H PRN Sharma Covert, MD   4 mg at 08/30/18 0636  . OXcarbazepine  (TRILEPTAL) tablet 150 mg  150 mg Oral BID Sharma Covert, MD   150 mg at 08/30/18 0817  . pantoprazole (PROTONIX) EC tablet 40 mg  40 mg Oral Daily Sharma Covert, MD   40 mg at 08/30/18 0816  . polyethylene glycol (MIRALAX / GLYCOLAX) packet 17 g  17 g Oral BID Sharma Covert, MD   17 g at 08/26/18 1709  . QUEtiapine (SEROQUEL) tablet 300 mg  300 mg Oral QHS Cobos, Fernando A, MD      . simvastatin (ZOCOR) tablet 10 mg  10 mg Oral q1800 Sharma Covert, MD   10 mg at 08/29/18 1708   PTA Medications: Medications Prior to Admission  Medication Sig Dispense Refill Last Dose  . amLODipine (NORVASC) 5 MG tablet Take 5 mg by mouth daily.   3 Taking  . busPIRone (BUSPAR) 5 MG tablet Take 5 mg by mouth 3 (three) times daily.   Taking  . cloNIDine (CATAPRES) 0.1 MG tablet Take 0.1 mg by mouth 3 (three) times daily.   0 Taking  . hydrOXYzine (VISTARIL) 25 MG capsule Take 25 mg by mouth 3 (three) times daily as needed for anxiety.   0  Taking  . linaclotide (LINZESS) 290 MCG CAPS capsule Take 1 capsule (290 mcg total) by mouth daily. 30 capsule 5   . lithium carbonate 300 MG capsule Take 1 capsule (300 mg total) by mouth 3 (three) times daily with meals. (Patient taking differently: Take 300 mg by mouth See admin instructions. Take 1 capsule in the morning and 2 capsules at night.) 90 capsule 0 Taking  . metoprolol tartrate (LOPRESSOR) 50 MG tablet Take 1 tablet (50 mg total) by mouth 2 (two) times daily. 60 tablet 0 Taking  . nicotine (NICODERM CQ - DOSED IN MG/24 HOURS) 21 mg/24hr patch Place 1 patch onto the skin daily.  4   . omeprazole (PRILOSEC) 20 MG capsule Take 20 mg by mouth daily.   0 Taking  . ondansetron (ZOFRAN) 4 MG tablet Take 4 mg by mouth every 4 (four) hours as needed.   3 Taking  . polyethylene glycol powder (GLYCOLAX/MIRALAX) powder Take one capful (17 grams) twice daily. 527 g 3   . promethazine (PHENERGAN) 25 MG suppository Place 1 suppository (25 mg total) rectally  every 6 (six) hours as needed for nausea or vomiting. 12 each 0 Taking  . promethazine (PHENERGAN) 25 MG tablet Take 25 mg by mouth every 6 (six) hours as needed for nausea or vomiting.   Taking  . QUEtiapine (SEROQUEL) 300 MG tablet Take 300 mg by mouth at bedtime.    Taking  . simvastatin (ZOCOR) 10 MG tablet Take 10 mg by mouth daily.   Taking  . TRANSDERM-SCOP, 1.5 MG, 1 MG/3DAYS Place 1 patch onto the skin every 3 (three) days.   2 Taking  . trazodone (DESYREL) 300 MG tablet Take 150 mg by mouth at bedtime.    Taking    Patient Stressors: Financial difficulties Health problems Occupational concerns Substance abuse  Patient Strengths: Ability for insight Active sense of humor Average or above average intelligence Capable of independent living Communication skills General fund of knowledge Motivation for treatment/growth Physical Health Supportive family/friends  Treatment Modalities: Medication Management, Group therapy, Case management,  1 to 1 session with clinician, Psychoeducation, Recreational therapy.   Physician Treatment Plan for Primary Diagnosis: <principal problem not specified> Long Term Goal(s): Improvement in symptoms so as ready for discharge Improvement in symptoms so as ready for discharge   Short Term Goals: Ability to identify changes in lifestyle to reduce recurrence of condition will improve Ability to verbalize feelings will improve Ability to disclose and discuss suicidal ideas Ability to demonstrate self-control will improve Ability to identify and develop effective coping behaviors will improve Ability to maintain clinical measurements within normal limits will improve Ability to identify changes in lifestyle to reduce recurrence of condition will improve Ability to verbalize feelings will improve Ability to disclose and discuss suicidal ideas Ability to demonstrate self-control will improve Ability to identify and develop effective coping  behaviors will improve Ability to maintain clinical measurements within normal limits will improve  Medication Management: Evaluate patient's response, side effects, and tolerance of medication regimen.  Therapeutic Interventions: 1 to 1 sessions, Unit Group sessions and Medication administration.  Evaluation of Outcomes: Progressing  Physician Treatment Plan for Secondary Diagnosis: Active Problems:   Major depressive disorder, recurrent episode ([place])  Long Term Goal(s): Improvement in symptoms so as ready for discharge Improvement in symptoms so as ready for discharge   Short Term Goals: Ability to identify changes in lifestyle to reduce recurrence of condition will improve Ability to verbalize feelings will improve Ability to disclose  and discuss suicidal ideas Ability to demonstrate self-control will improve Ability to identify and develop effective coping behaviors will improve Ability to maintain clinical measurements within normal limits will improve Ability to identify changes in lifestyle to reduce recurrence of condition will improve Ability to verbalize feelings will improve Ability to disclose and discuss suicidal ideas Ability to demonstrate self-control will improve Ability to identify and develop effective coping behaviors will improve Ability to maintain clinical measurements within normal limits will improve     Medication Management: Evaluate patient's response, side effects, and tolerance of medication regimen.  Therapeutic Interventions: 1 to 1 sessions, Unit Group sessions and Medication administration.  Evaluation of Outcomes: Progressing   RN Treatment Plan for Primary Diagnosis: <principal problem not specified> Long Term Goal(s): Knowledge of disease and therapeutic regimen to maintain health will improve  Short Term Goals: Ability to demonstrate self-control, Ability to participate in decision making will improve, Ability to verbalize feelings will  improve, Ability to disclose and discuss suicidal ideas and Ability to identify and develop effective coping behaviors will improve  Medication Management: RN will administer medications as ordered by provider, will assess and evaluate patient's response and provide education to patient for prescribed medication. RN will report any adverse and/or side effects to prescribing provider.  Therapeutic Interventions: 1 on 1 counseling sessions, Psychoeducation, Medication administration, Evaluate responses to treatment, Monitor vital signs and CBGs as ordered, Perform/monitor CIWA, COWS, AIMS and Fall Risk screenings as ordered, Perform wound care treatments as ordered.  Evaluation of Outcomes: Progressing   LCSW Treatment Plan for Primary Diagnosis: <principal problem not specified> Long Term Goal(s): Safe transition to appropriate next level of care at discharge, Engage patient in therapeutic group addressing interpersonal concerns.  Short Term Goals: Engage patient in aftercare planning with referrals and resources  Therapeutic Interventions: Assess for all discharge needs, 1 to 1 time with Social worker, Explore available resources and support systems, Assess for adequacy in community support network, Educate family and significant other(s) on suicide prevention, Complete Psychosocial Assessment, Interpersonal group therapy.  Evaluation of Outcomes: Adequate for Discharge   Progress in Treatment: Attending groups: Yes. Participating in groups: Yes. Taking medication as prescribed: Yes. Toleration medication: Yes. Family/Significant other contact made: Yes, individual(s) contacted:  the patient's wife Patient understands diagnosis: Yes. Discussing patient identified problems/goals with staff: Yes. Medical problems stabilized or resolved: Yes. Denies suicidal/homicidal ideation: Yes. Issues/concerns per patient self-inventory: No. Other:   New problem(s) identified: None   New Short  Term/Long Term Goal(s): medication stabilization, elimination of SI thoughts, development of comprehensive mental wellness plan.    Patient Goals:  Help with depression  Discharge Plan or Barriers: Patient plans to return home with his wife and children at discharge. Patient reports he would like to be referred to a psychiatrist for medication management once he is discharged from the hospital. CSW will assess for appropriate referrals and continue follow for any additional discharge plans.   Reason for Continuation of Hospitalization: Anxiety Depression Medication stabilization Suicidal ideation  Estimated Length of Stay: 3-5 days   Attendees: Patient: 08/30/2018 3:13 PM  Physician: Dr. Myles Lipps, MD; Dr. Neita Garnet, MD 08/30/2018 3:13 PM  Nursing: Chong Sicilian.D, RN; Estill Bamberg.Loletha Grayer, RN 08/30/2018 3:13 PM  RN Care Manager: Lars Pinks, RN 08/30/2018 3:13 PM  Social Worker: Radonna Ricker, [place] 08/30/2018 3:13 PM  Recreational Therapist:  08/30/2018 3:13 PM  Other: Agustina Caroli, NP 08/30/2018 3:13 PM  Other:  08/30/2018 3:13 PM  Other: 08/30/2018 3:13 PM    Scribe  for Treatment Team: Chevis Pretty 08/30/2018 3:13 PM

## 2018-08-30 NOTE — [facility] Group Notes (Signed)
LCSW Group Therapy Note 08/30/2018 2:13 PM  Type of Therapy/Topic: Group Therapy: Feelings about Diagnosis  Participation Level: Active   Description of Group:  This group will allow patients to explore their thoughts and feelings about diagnoses they have received. Patients will be guided to explore their level of understanding and acceptance of these diagnoses. Facilitator will encourage patients to process their thoughts and feelings about the reactions of others to their diagnosis and will guide patients in identifying ways to discuss their diagnosis with significant others in their lives. This group will be process-oriented, with patients participating in exploration of their own experiences, giving and receiving support, and processing challenge from other group members.  Therapeutic Goals: 1. Patient will demonstrate understanding of diagnosis as evidenced by identifying two or more symptoms of the disorder 2. Patient will be able to express two feelings regarding the diagnosis 3. Patient will demonstrate their ability to communicate their needs through discussion and/or role play  Summary of Patient Progress:  Jermaine [name] was engaged and participated throughout the group session. [name] reports that he feels that he is "crazy". [name] reports that society labels him as crazy and that he sometimes believes it. [name] reports that he would like assistance with finding treatment for his physical health issues. He states that his physical ailments contribute to his mental health issues.     Therapeutic Modalities:  Cognitive Behavioral Therapy Brief Therapy Feelings Identification    [place] Clinical Social Worker

## 2018-08-30 NOTE — Plan of Care (Signed)
  Problem: Coping: Goal: Ability to verbalize frustrations and anger appropriately will improve Outcome: Progressing Goal: Ability to demonstrate self-control will improve Outcome: Not Progressing    D: Pt alert and oriented on the unit. Pt engaging with RN staff and other pts. Pt denies SI/HI, A/VH. Pt participated during unit groups and activities. Pt argued with another pt and cursed at the pt. RN staff processed with pt and pt was redirectable. Pt was cooperative. A: Education, support and encouragement provided, q15 minute safety checks remain in effect. Medications administered per MD orders. R: No reactions/side effects to medicine noted. Pt denies any concerns at this time, and verbally contracts for safety. Pt ambulating on the unit with no issues. Pt remains safe on and off the unit.

## 2018-08-31 MED ORDER — QUETIAPINE FUMARATE 400 MG PO TABS
400.0000 mg | ORAL_TABLET | Freq: Every day | ORAL | Status: DC
  Administered 2018-08-31 – 2018-09-01 (×2): 400 mg via ORAL
  Filled 2018-08-31 (×4): qty 1

## 2018-08-31 NOTE — Progress Notes (Signed)
Pt states he can't sleep tonight because he didn't get Seroquel 300 with repeat. Pt states "I been getting it; I got it last night". Pt was encourage to speak with provider. Pt remains irritable and proceeded to go to room and read book.

## 2018-08-31 NOTE — Plan of Care (Signed)
  Problem: Health Behavior/Discharge Planning: Goal: Compliance with treatment plan for underlying cause of condition will improve Outcome: Progressing   Problem: Safety: Goal: Ability to remain free from injury will improve Outcome: Progressing   Patient is compliant with medications and staff requests. Patient is on q15 minute safety checks, high fall risk precautions, and contracts for safety with staff.

## 2018-08-31 NOTE — [facility] Group Notes (Signed)
[facility] Group Therapy      08/31/2018 3:59 PM  Type of Therapy: Mental Health Association Presentation  Participation Level: Did Not Attend  Summary of Progress/Problems:   Invited, chose not to attend.    [place] Social Worker
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
## 2018-08-31 NOTE — Progress Notes (Signed)
Recreation Therapy Notes  Date: 12.11.19 Time: 0930 Location: 300 Hall Dayroom  Group Topic: Stress Management  Goal Area(s) Addresses:  Patient will verbalize importance of using healthy stress management.  Patient will identify positive emotions associated with healthy stress management.   Intervention: Stress Management  Activity : Meditation.  LRT introduced the stress management technique of meditation.  LRT played a meditation on being resilient.  Patients were to follow along as meditation played to engage in activity.  Education:  Stress Management, Discharge Planning.   Education Outcome: Acknowledges edcuation/In group clarification offered/Needs additional education  Clinical Observations/Feedback: Pt did not attend group.     Victorino Sparrow, LRT/CTRS         Ria Comment, Haydn Cush A 08/31/2018 10:59 AM

## 2018-08-31 NOTE — Progress Notes (Signed)
Per MHT on hall, Pt was observed sitting on side of bed "asleep sitting up" with room lights on and door prop open. Per MHT, Pt was asleep at times but would come to NS when startle at Q 15 checks intervals and state that he couldn't sleep. Per MHT, Pt was observed "tensing up" during vitals. Will pass on to day-shift.

## 2018-08-31 NOTE — Therapy (Signed)
Occupational Therapy Group Note  Date:  08/31/2018 Time:  3:13 PM  Group Topic/Focus:  Stress Management  Participation Level:  Active  Participation Quality:  Attentive and Resistant  Affect:  Blunted  Cognitive:  Appropriate  Insight: Limited  Engagement in Group:  Engaged and Resistant  Modes of Intervention:  Activity, Discussion, Education and Socialization  Additional Comments:    S: "I haven't done an activity like this in 30 years"  O: Education given on stress management and healthy coping mechanisms. Pt encouraged to brainstorm with other peers and discuss what has worked in the past vs what has not. Pts further encouraged to discuss new coping stress management strategies to implement this date. Art activity made to display preferred coping mechanisms, along with incorporating the stress management outlet of coloring/art.  ?  A: Pt presents to group with blunted affect, mildly resistant to discussion of stress management strategies. Pt did not participate in discussion, but completed art activity successfully . With cues and redirection, pt engaged appropriately in activity. ?  P: Pt provided with education on stress management activities to implement into daily routine. Handouts given to facilitate carryover when reintegrating into community. OT groups will continue once a week as pt is acute.   Zenovia Jarred, MSOT, OTR/L Behavioral Health OT/ Acute Relief OT PHP Office: [place] 08/31/2018, 3:13 PM

## 2018-08-31 NOTE — Progress Notes (Signed)
Patient ID: Jermaine [name], male   DOB: 01-25-1981, 37 y.o.   MRN: [id number]  Nursing Progress Note [id number]  Data: On initial approach, patient was waiting at the nurses's station for writer to get out of report. Patient presents with flat affect and depressed mood. Patient compliant with scheduled medications. Patient denies pain/physical complaints. Patient completed self-inventory sheet and rates depression, hopelessness, and anxiety 8,8,8 respectively. Patient rates their sleep and appetite as poor/fair respectively. Patient states goal for today is to "work on an exit plan". Patient is seen attending groups and visible in the milieu. Patient currently denies SI/HI/AVH. Patient is noted to staff split at times and does require redirection by staff.   Action: Patient is educated about and provided medication per provider's orders. Patient safety maintained with q15 min safety checks and frequent rounding. High fall risk precautions in place due to history of pseudo-seizures. Emotional support given. 1:1 interaction and active listening provided. Patient encouraged to attend meals, groups, and work on treatment plan and goals. Labs, vital signs and patient behavior monitored throughout shift.   Response: Patient remains safe on the unit at this time and agrees to come to staff with any issues/concerns. Patient is interacting with peers appropriately on the unit. Will continue to support and monitor.

## 2018-08-31 NOTE — Progress Notes (Signed)
Pt is observed in the dayroom, seen interacting with peers. Pt appears labile/irritable/dominating/intrusive in affect and mood. Pt denies HI/AVH/Pain at this time. Endorses passive but Music therapist for safety. Pt appears calmer this evening and assertive with interaction. Seroquel as increase to 400 at bedtime.PRN vistaril offered and accepted.Support and encouragement offered.Will continue with POC.
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
## 2018-09-01 NOTE — Progress Notes (Signed)
Patient ID: Jermaine [name], male   DOB: 1981/05/10, 37 y.o.   MRN: [id number]  Nursing Progress Note [id number]  Data: On initial approach, patient was waiting for RN to get out of report. Patient presents with flat affect and labile mood. Patient compliant with scheduled medications. Patient denies pain/physical complaints. Patient completed self-inventory sheet and rates depression, hopelessness, and anxiety 7,7,7 respectively. Patient rates their sleep and appetite as poor/good respectively. Patient states goal for today is to "be positive, improve mood". Patient is seen attending groups and visible in the milieu. Patient currently denies SI/HI/AVH.   Action: Patient is educated about and provided medication per provider's orders. Patient safety maintained with q15 min safety checks and frequent rounding. High fall risk precautions in place (due to hx of pseudo-seizures). Emotional support given. 1:1 interaction and active listening provided. Patient encouraged to attend meals, groups, and work on treatment plan and goals. Labs, vital signs and patient behavior monitored throughout shift.   Response: Patient remains safe on the unit at this time and agrees to come to staff with any issues/concerns. Patient is interacting with peers appropriately on the unit. Will continue to support and monitor.

## 2018-09-01 NOTE — Progress Notes (Signed)
D: Pt was in bed in his room upon initial approach.  Pt presents with anxious affect and mood.  He describes his day as "all right."  His goal is to "watch the football game."  Pt denies SI/HI, denies hallucinations, reports chronic back pain of 5/10.  Pt has been visible in milieu.  Interacts with peers appropriately but is loud and attention-seeking at times.  Easily redirected.  A: Introduced self to pt.  Met with pt 1:1.  Actively listened to pt and offered support and encouragement.  Medications administered per order.  PRN medication administered for pain and anxiety.  Q15 minute safety checks maintained.  R: Pt is safe on the unit.  Pt is compliant with medications.  Pt verbally contracts for safety.  Will continue to monitor and assess.

## 2018-09-02 LAB — BASIC METABOLIC PANEL
Anion gap: 12 (ref 5–15)
BUN: 9 mg/dL (ref 6–20)
CO2: 25 mmol/L (ref 22–32)
Calcium: 8.5 mg/dL — ABNORMAL LOW (ref 8.9–10.3)
Chloride: 100 mmol/L (ref 98–111)
Creatinine, Ser: 0.94 mg/dL (ref 0.61–1.24)
GFR calc Af Amer: >60 mL/min (ref >60)
GFR calc non Af Amer: >60 mL/min (ref >60)
Glucose, Bld: 163 mg/dL — ABNORMAL HIGH (ref 70–99)
Potassium: 3.5 mmol/L (ref 3.5–5.1)
Sodium: 137 mmol/L (ref 135–145)

## 2018-09-02 MED ORDER — DESVENLAFAXINE SUCCINATE ER 50 MG PO TB24: 50.0000 mg | ORAL_TABLET | Freq: Every day | ORAL | 0 refills | Status: DC

## 2018-09-02 MED ORDER — OXCARBAZEPINE 150 MG PO TABS: 150.0000 mg | ORAL_TABLET | Freq: Two times a day (BID) | ORAL | 0 refills | Status: DC

## 2018-09-02 MED ORDER — LINACLOTIDE 290 MCG PO CAPS: 290.0000 ug | ORAL_CAPSULE | Freq: Every day | ORAL | 0 refills | Status: DC

## 2018-09-02 MED ORDER — CLONIDINE HCL 0.1 MG PO TABS: 0.1000 mg | ORAL_TABLET | Freq: Three times a day (TID) | ORAL | 0 refills | Status: DC

## 2018-09-02 MED ORDER — POLYETHYLENE GLYCOL 3350 17 GM/SCOOP PO POWD: ORAL | 0 refills | Status: DC

## 2018-09-02 MED ORDER — GABAPENTIN 300 MG PO CAPS: 600.0000 mg | ORAL_CAPSULE | Freq: Three times a day (TID) | ORAL | 0 refills | Status: DC

## 2018-09-02 MED ORDER — QUETIAPINE FUMARATE 400 MG PO TABS: 400.0000 mg | ORAL_TABLET | Freq: Every day | ORAL | 0 refills | Status: DC

## 2018-09-02 MED ORDER — AMLODIPINE BESYLATE 5 MG PO TABS: 5.0000 mg | ORAL_TABLET | Freq: Every day | ORAL | 0 refills | Status: DC

## 2018-09-02 MED ORDER — NICOTINE POLACRILEX 2 MG MT GUM: 2.0000 mg | CHEWING_GUM | OROMUCOSAL | 0 refills | Status: DC | PRN

## 2018-09-02 MED ORDER — METOPROLOL TARTRATE 50 MG PO TABS: 50.0000 mg | ORAL_TABLET | Freq: Two times a day (BID) | ORAL | 0 refills | Status: DC

## 2018-09-02 MED ORDER — SIMVASTATIN 10 MG PO TABS: 10.0000 mg | ORAL_TABLET | Freq: Every day | ORAL | 0 refills | Status: DC

## 2018-09-02 MED ORDER — HYDROXYZINE HCL 25 MG PO TABS: 25.0000 mg | ORAL_TABLET | Freq: Four times a day (QID) | ORAL | 0 refills | Status: DC | PRN

## 2018-09-02 MED ORDER — LITHIUM CARBONATE 300 MG PO CAPS: ORAL_CAPSULE | ORAL | 0 refills | Status: DC

## 2018-09-02 MED ORDER — OMEPRAZOLE 20 MG PO CPDR: 20.0000 mg | DELAYED_RELEASE_CAPSULE | Freq: Every day | ORAL | 0 refills | Status: DC

## 2018-09-02 MED ORDER — ONDANSETRON HCL 4 MG PO TABS: 4.0000 mg | ORAL_TABLET | ORAL | 0 refills | Status: DC | PRN

## 2018-09-02 NOTE — Progress Notes (Signed)
  [facility] Adult Case Management Discharge Plan :  Will you be returning to the same living situation after discharge:  Yes,  patient reports he is returning home with his wife and children  At discharge, do you have transportation home?: Yes,  patient being transported home via taxi voucher Do you have the ability to pay for your medications: No.  Release of information consent forms completed and in the chart;  Patient's signature needed at discharge.  Patient to Follow up at: Follow-up Information    Services, [facility]. Go on 09/06/2018.   Why:  Your next hospital follow up appointment is Tuesday, 09/06/18 at 10:00a.  Please bring: photo ID, proof of insurance, social security card, and discharge paperwork from this hospitalization. Contact information: [place] [place] [address] [phone]        [name], MD. Go on 09/05/2018.   Specialty:  Internal Medicine Why:  Please attend your medication management appointment on Monday,09/05/18 at 10:40a.  Contact information: [facility] [address] [phone]           Next level of care provider has access to [place] and Suicide Prevention discussed: Yes,  with the patient's wife  Have you used any form of tobacco in the last 30 days? (Cigarettes, Smokeless Tobacco, Cigars, and/or Pipes): Yes  Has patient been referred to the Quitline?: Patient refused referral  Patient has been referred for addiction treatment: N/A  [name], [place] 09/02/2018, 9:36 AM
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
## 2018-09-02 NOTE — [facility] Suicide Risk Assessment (Signed)
[facility] Discharge Suicide Risk Assessment   Principal Problem: Depression  Discharge Diagnoses: Active Problems:   Major depressive disorder, recurrent episode ([place])   Total Time spent with patient: 30 minutes  Musculoskeletal: Strength & Muscle Tone: within normal limits Gait & Station: normal Patient leans: N/A  Psychiatric Specialty Exam: ROS no chest pain, no shortness of breath, no vomiting, no further seizure like episodes  Blood pressure 112/74, pulse 65, temperature (!) 97.5 F (36.4 C), temperature source Oral, resp. rate 20, height 6' (1.829 m), weight 90.7 kg, SpO2 97 %.Body mass index is 27.12 kg/m.  General Appearance: Fairly Groomed  Engineer, water::  Improving  Speech:  Normal Rate409  Volume:  Normal  Mood:  Reports partially improved mood compared to admission, acknowledges he is feeling better  Affect:  Vaguely dysphoric, but improves during session, smiles at times appropriately  Thought Process:  Linear and Descriptions of Associations: Intact  Orientation:  Full (Time, Place, and Person)  Thought Content:  Denies hallucinations, no delusions expressed, does not appear internally preoccupied  Suicidal Thoughts:  No denies suicidal or self-injurious ideations at this time, no homicidal or violent ideations  Homicidal Thoughts:  No  Memory:  Recent and remote grossly intact  Judgement:  Other:  Improving  Insight:  Fair  Psychomotor Activity:  Normal  Concentration:  Good  Recall:  Good  Fund of Knowledge:Good  Language: Good  Akathisia:  Negative  Handed:  Right  AIMS (if indicated):     Assets:  Desire for Improvement Resilience  Sleep:  Number of Hours: 4.25  Cognition: WNL  ADL's:  Intact   Mental Status Per Nursing Assessment::   On Admission:  Suicidal ideation indicated by patient, Self-harm thoughts, Suicide plan  Demographic Factors:  37 year old male  Loss Factors: Reports he has been unable to work due to onset of seizure-like  episodes  Historical Factors: History of mood disorder, history of prior psychiatric admissions, history of suicide attempts. History of seizure like episodes, which started 1 to 2 years ago.  Has had outpatient neurological work-up which has reportedly been negative, has been told seizures are nonepileptic.  Risk Reduction Factors:   Responsible for children under 50 years of age, Sense of responsibility to family and Positive coping skills or problem solving skills  Continued Clinical Symptoms:  Patient is alert, attentive, calm, describes partially improved mood compared to admission, affect remains vaguely dysphoric but is more reactive, no thought disorder, denies suicidal ideations, denies homicidal ideations, no hallucinations, no delusions expressed, currently future oriented, looking forward to seeing his family and also reporting he is applying for disability as he is unable to work due to seizure episodes. No disruptive or agitated behaviors on unit Currently denies medication side effects. Side effects reviewed including risk of sedation, weight gain, metabolic and motor side effects associated with Seroquel , potential renal and thyroid side effects related to Li, symptoms of Li toxicity 12/13 labs reviewed- Na+137  Cognitive Features That Contribute To Risk:  No gross cognitive deficits noted upon discharge. Is alert , attentive, and oriented x 3   Suicide Risk:  Mild:  Suicidal ideation of limited frequency, intensity, duration, and specificity.  There are no identifiable plans, no associated intent, mild dysphoria and related symptoms, good self-control (both objective and subjective assessment), few other risk factors, and identifiable protective factors, including available and accessible social support.  Follow-up Information    [facility]. Go on 09/06/2018.   Why:  Your next hospital follow up  appointment is Tuesday, 09/06/18 at 10:00a.  Please bring: photo  ID, proof of insurance, social security card, and discharge paperwork from this hospitalization. Contact information: [place] [address] [phone]        [name], MD. Go on 09/05/2018.   Specialty:  Internal Medicine Why:  Please attend your medication management appointment on Monday,09/05/18 at 10:40a.  Contact information: [place] [facility] [address] [phone]           Plan Of Care/Follow-up recommendations:  Activity:  as tolerated Diet:  regular Tests:  NA Other:  See below Patient is discharging home. We will follow-up as above. He has an established primary care doctor, Dr. Nevada Crane, and also has an outpatient neurologist at [facility].  To follow-up for outpatient treatment, encouraged to discuss slightly elevated TSH with PCP and repeat TSH/thyroid function in a few weeks. Of note patient states he does not drive.   Jenne Campus, MD 09/02/2018, 9:02 AM

## 2018-09-02 NOTE — Progress Notes (Signed)
Recreation Therapy Notes  Date: 12.13.19 Time: 0930 Location: [address] Dayroom  Group Topic: Stress Management  Goal Area(s) Addresses:  Patient will verbalize importance of using healthy stress management.  Patient will identify positive emotions associated with healthy stress management.   Intervention: Stress Management  Activity :  Meditation.  LRT introduced the stress management technique of meditation.  LRT played a meditation focused on taking on the characteristics of a mountain.  Patients were to listen as meditation played to engage in the activity.  Education:  Stress Management, Discharge Planning.   Education Outcome: Acknowledges edcuation/In group clarification offered/Needs additional education  Clinical Observations/Feedback: Pt did not attend group.      Victorino Sparrow, LRT/CTRS         Victorino Sparrow A 09/02/2018 11:33 AM

## 2018-09-02 NOTE — Plan of Care (Signed)
Discharge Note  Patient verbalizes readiness for discharge. Follow up plan explained, AVS, Transition record and SRA given. Prescriptions and teaching provided. Belongings returned and signed for. Suicide safety plan completed and signed. Patient verbalizes understanding. Patient denies SI/HI and assures this Probation officer he will seek assistance should that change. Patient discharged to lobby with [employer] taxi voucher.  Problem: Spiritual Needs Goal: Ability to function at adequate level Outcome: Adequate for Discharge   Problem: Education: Goal: Knowledge of [facility] Education information/materials will improve Outcome: Adequate for Discharge Goal: Emotional status will improve Outcome: Adequate for Discharge Goal: Mental status will improve Outcome: Adequate for Discharge Goal: Verbalization of understanding the information provided will improve Outcome: Adequate for Discharge   Problem: Activity: Goal: Interest or engagement in activities will improve Outcome: Adequate for Discharge Goal: Sleeping patterns will improve Outcome: Adequate for Discharge   Problem: Coping: Goal: Ability to verbalize frustrations and anger appropriately will improve Outcome: Adequate for Discharge Goal: Ability to demonstrate self-control will improve Outcome: Adequate for Discharge   Problem: Health Behavior/Discharge Planning: Goal: Identification of resources available to assist in meeting health care needs will improve Outcome: Adequate for Discharge Goal: Compliance with treatment plan for underlying cause of condition will improve Outcome: Adequate for Discharge   Problem: Education: Goal: Knowledge of disease or condition will improve Outcome: Adequate for Discharge Goal: Understanding of discharge needs will improve Outcome: Adequate for Discharge   Problem: Safety: Goal: Ability to remain free from injury will improve Outcome: Adequate for Discharge   Problem:  Education: Goal: Ability to state activities that reduce stress will improve Outcome: Adequate for Discharge   Problem: Coping: Goal: Ability to identify and develop effective coping behavior will improve Outcome: Adequate for Discharge   Problem: Education: Goal: Utilization of techniques to improve thought processes will improve Outcome: Adequate for Discharge Goal: Knowledge of the prescribed therapeutic regimen will improve Outcome: Adequate for Discharge   Problem: Coping: Goal: Coping ability will improve Outcome: Adequate for Discharge Goal: Will verbalize feelings Outcome: Adequate for Discharge   Problem: Education: Goal: Ability to make informed decisions regarding treatment will improve Outcome: Adequate for Discharge   Problem: Coping: Goal: Coping ability will improve Outcome: Adequate for Discharge   Problem: Self-Concept: Goal: Ability to disclose and discuss suicidal ideas will improve Outcome: Adequate for Discharge Goal: Will verbalize positive feelings about self Outcome: Adequate for Discharge

## 2018-09-10 ENCOUNTER — Other Ambulatory Visit: Payer: Self-pay

## 2018-09-10 ENCOUNTER — Ambulatory Visit (HOSPITAL_COMMUNITY)
Admission: RE | Admit: 2018-09-10 | Discharge: 2018-09-10 | Disposition: A | Discharge status: Home / Self Care | Payer: Medicaid Other | Attending: Psychiatry | Admitting: Psychiatry

## 2018-09-10 ENCOUNTER — Encounter (HOSPITAL_COMMUNITY): Payer: Self-pay | Admitting: Emergency Medicine

## 2018-09-10 ENCOUNTER — Emergency Department (HOSPITAL_COMMUNITY)
Admission: EM | Admit: 2018-09-10 | Discharge: 2018-09-11 | Disposition: A | Discharge status: Home / Self Care | Payer: Medicaid Other | Attending: Emergency Medicine | Admitting: Emergency Medicine

## 2018-09-10 DIAGNOSIS — Z9889 Other specified postprocedural states: Secondary | ICD-10-CM | POA: Insufficient documentation

## 2018-09-10 DIAGNOSIS — F329 Major depressive disorder, single episode, unspecified: Secondary | ICD-10-CM | POA: Diagnosis present

## 2018-09-10 DIAGNOSIS — F332 Major depressive disorder, recurrent severe without psychotic features: Secondary | ICD-10-CM | POA: Insufficient documentation

## 2018-09-10 DIAGNOSIS — F339 Major depressive disorder, recurrent, unspecified: Secondary | ICD-10-CM | POA: Diagnosis present

## 2018-09-10 DIAGNOSIS — Z79899 Other long term (current) drug therapy: Secondary | ICD-10-CM | POA: Insufficient documentation

## 2018-09-10 DIAGNOSIS — I10 Essential (primary) hypertension: Secondary | ICD-10-CM | POA: Diagnosis not present

## 2018-09-10 DIAGNOSIS — R45851 Suicidal ideations: Secondary | ICD-10-CM | POA: Diagnosis not present

## 2018-09-10 DIAGNOSIS — Z87891 Personal history of nicotine dependence: Secondary | ICD-10-CM | POA: Diagnosis not present

## 2018-09-10 DIAGNOSIS — I2510 Atherosclerotic heart disease of native coronary artery without angina pectoris: Secondary | ICD-10-CM | POA: Insufficient documentation

## 2018-09-10 DIAGNOSIS — Z833 Family history of diabetes mellitus: Secondary | ICD-10-CM | POA: Insufficient documentation

## 2018-09-10 LAB — ETHANOL: Alcohol, Ethyl (B): <10 mg/dL (ref <10)

## 2018-09-10 LAB — COMPREHENSIVE METABOLIC PANEL
ALT: 32 U/L (ref 0–44)
AST: 26 U/L (ref 15–41)
Albumin: 4.2 g/dL (ref 3.5–5.0)
Alkaline Phosphatase: 60 U/L (ref 38–126)
Anion gap: 10 (ref 5–15)
BUN: 8 mg/dL (ref 6–20)
CO2: 24 mmol/L (ref 22–32)
Calcium: 8.7 mg/dL — ABNORMAL LOW (ref 8.9–10.3)
Chloride: 104 mmol/L (ref 98–111)
Creatinine, Ser: 0.84 mg/dL (ref 0.61–1.24)
GFR calc Af Amer: >60 mL/min (ref >60)
GFR calc non Af Amer: >60 mL/min (ref >60)
GLUCOSE: 74 mg/dL (ref 70–99)
Potassium: 4 mmol/L (ref 3.5–5.1)
SODIUM: 138 mmol/L (ref 135–145)
Total Bilirubin: 0.5 mg/dL (ref 0.3–1.2)
Total Protein: 6.3 g/dL — ABNORMAL LOW (ref 6.5–8.1)

## 2018-09-10 LAB — CBC
HCT: 37.9 % — ABNORMAL LOW (ref 39.0–52.0)
HEMOGLOBIN: 12.7 g/dL — AB (ref 13.0–17.0)
MCH: 31.6 pg (ref 26.0–34.0)
MCHC: 33.5 g/dL (ref 30.0–36.0)
MCV: 94.3 fL (ref 80.0–100.0)
Platelets: 194 10*3/uL (ref 150–400)
RBC: 4.02 MIL/uL — ABNORMAL LOW (ref 4.22–5.81)
RDW: 13.9 % (ref 11.5–15.5)
WBC: 7.8 10*3/uL (ref 4.0–10.5)
nRBC: 0 % (ref 0.0–0.2)

## 2018-09-10 LAB — ACETAMINOPHEN LEVEL: Acetaminophen (Tylenol), Serum: <10 ug/mL — ABNORMAL LOW (ref 10–30)

## 2018-09-10 LAB — SALICYLATE LEVEL: Salicylate Lvl: <7 mg/dL (ref 2.8–30.0)

## 2018-09-10 LAB — RAPID URINE DRUG SCREEN, HOSP PERFORMED
Amphetamines: NOT DETECTED
Barbiturates: NOT DETECTED
Benzodiazepines: NOT DETECTED
Cocaine: NOT DETECTED
Opiates: NOT DETECTED
Tetrahydrocannabinol: NOT DETECTED

## 2018-09-10 LAB — LITHIUM LEVEL: Lithium Lvl: 0.7 mmol/L (ref 0.60–1.20)

## 2018-09-10 MED ORDER — QUETIAPINE FUMARATE 300 MG PO TABS
400.0000 mg | ORAL_TABLET | Freq: Every day | ORAL | Status: DC
  Administered 2018-09-10: 400 mg via ORAL
  Filled 2018-09-10: qty 1

## 2018-09-10 MED ORDER — ONDANSETRON HCL 4 MG PO TABS: 4.0000 mg | ORAL_TABLET | ORAL | Status: DC | PRN

## 2018-09-10 MED ORDER — HYDROXYZINE HCL 25 MG PO TABS
25.0000 mg | ORAL_TABLET | Freq: Four times a day (QID) | ORAL | Status: DC | PRN
  Administered 2018-09-11: 25 mg via ORAL
  Filled 2018-09-10: qty 1

## 2018-09-10 MED ORDER — SIMVASTATIN 10 MG PO TABS
10.0000 mg | ORAL_TABLET | Freq: Every day | ORAL | Status: DC
  Filled 2018-09-10: qty 1

## 2018-09-10 MED ORDER — LITHIUM CARBONATE 300 MG PO CAPS
300.0000 mg | ORAL_CAPSULE | Freq: Two times a day (BID) | ORAL | Status: DC
  Administered 2018-09-11: 300 mg via ORAL
  Filled 2018-09-10: qty 1

## 2018-09-10 MED ORDER — OXCARBAZEPINE 150 MG PO TABS
150.0000 mg | ORAL_TABLET | Freq: Two times a day (BID) | ORAL | Status: DC
  Administered 2018-09-10 – 2018-09-11 (×2): 150 mg via ORAL
  Filled 2018-09-10 (×2): qty 1

## 2018-09-10 MED ORDER — METOPROLOL TARTRATE 25 MG PO TABS
50.0000 mg | ORAL_TABLET | Freq: Two times a day (BID) | ORAL | Status: DC
  Administered 2018-09-10 – 2018-09-11 (×2): 50 mg via ORAL
  Filled 2018-09-10 (×2): qty 2

## 2018-09-10 MED ORDER — CLONIDINE HCL 0.1 MG PO TABS
0.1000 mg | ORAL_TABLET | Freq: Three times a day (TID) | ORAL | Status: DC
  Administered 2018-09-10 – 2018-09-11 (×2): 0.1 mg via ORAL
  Filled 2018-09-10 (×2): qty 1

## 2018-09-10 MED ORDER — PANTOPRAZOLE SODIUM 40 MG PO TBEC
40.0000 mg | DELAYED_RELEASE_TABLET | Freq: Every day | ORAL | Status: DC
  Administered 2018-09-11: 40 mg via ORAL
  Filled 2018-09-10: qty 1

## 2018-09-10 MED ORDER — LINACLOTIDE 145 MCG PO CAPS
290.0000 ug | ORAL_CAPSULE | Freq: Every day | ORAL | Status: DC
  Administered 2018-09-11: 290 ug via ORAL
  Filled 2018-09-10: qty 2

## 2018-09-10 MED ORDER — NICOTINE 21 MG/24HR TD PT24: 21.0000 mg | MEDICATED_PATCH | Freq: Once | TRANSDERMAL | Status: DC

## 2018-09-10 MED ORDER — AMLODIPINE BESYLATE 5 MG PO TABS
5.0000 mg | ORAL_TABLET | Freq: Every day | ORAL | Status: DC
  Administered 2018-09-11: 5 mg via ORAL
  Filled 2018-09-10: qty 1

## 2018-09-10 MED ORDER — GABAPENTIN 300 MG PO CAPS
600.0000 mg | ORAL_CAPSULE | Freq: Three times a day (TID) | ORAL | Status: DC
  Administered 2018-09-10 – 2018-09-11 (×2): 600 mg via ORAL
  Filled 2018-09-10 (×2): qty 2

## 2018-09-10 NOTE — ED Notes (Signed)
Bed: WBH37 Expected date:  Expected time:  Means of arrival:  Comments: Triage 4  
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
## 2018-09-10 NOTE — ED Triage Notes (Signed)
Pt c/o depression and feeling suicidal. Pt states he "hopes an animal drags him off". Pt states he has been treated for depression in the past at [facility] and [facility].

## 2018-09-10 NOTE — ED Triage Notes (Signed)
Pt states that he and fiance are having relationship problems and he wishes to be here so he doesn't hurt her.
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
## 2018-09-10 NOTE — ED Notes (Signed)
Patient has been wanded by security.  

## 2018-09-10 NOTE — ED Notes (Signed)
Bed: [facility]PT4 Expected date:  Expected time:  Means of arrival:  Comments: 

## 2018-09-11 NOTE — ED Notes (Signed)
Patient presents with flat affect and depressed mood.  Endorses suicidal thoughts but verbally contracts for safety while in the hospital.  States he needed help for his depression and also to get away from his relationship problem.  Medication given as prescribed.  Routine safety checks maintained every 15 minutes and via security camera.  Requested and received Vistaril 25 mg for complain of anxiety with good effect.  Patient is safe on the unit.

## 2018-09-11 NOTE — ED Notes (Signed)
Pt discharged safely with resources.  All belongings were returned  To pt.
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
## 2018-09-11 NOTE — Discharge Instructions (Signed)
For your mental health and medication needs, please follow up at one these [facility] locations, whichever is the closest to your home:   [facility]  [address] [place], [place] [address] [phone]  [facility] [address] [place], [place] [address] [phone]  [place], [place] [address] [phone]

## 2018-10-02 NOTE — Progress Notes (Signed)
Please schedule patient for smart pill test per RMR recommendations (he said he spoke to [facility] and she agreed to read it).   Dx: constipation/obstipation.

## 2018-10-03 ENCOUNTER — Telehealth: Payer: Self-pay | Admitting: *Deleted

## 2018-10-03 ENCOUNTER — Other Ambulatory Visit (HOSPITAL_BASED_OUTPATIENT_CLINIC_OR_DEPARTMENT_OTHER): Payer: Self-pay

## 2018-10-03 DIAGNOSIS — R5383 Other fatigue: Secondary | ICD-10-CM

## 2018-10-03 DIAGNOSIS — G4730 Sleep apnea, unspecified: Secondary | ICD-10-CM

## 2018-10-03 NOTE — Progress Notes (Signed)
See TE 10/03/2018

## 2018-10-03 NOTE — Telephone Encounter (Signed)
PER ADDENDUM Mahala Menghini, PA-C filed at 10/02/2018 6:39 PM   Status: Signed    Please schedule patient for smart pill test per RMR recommendations (he said he spoke to [facility] and she agreed to read it).   Dx: constipation/obstipation.

## 2018-10-04 ENCOUNTER — Other Ambulatory Visit: Payer: Self-pay

## 2018-10-04 ENCOUNTER — Telehealth: Payer: Self-pay | Admitting: Internal Medicine

## 2018-10-04 ENCOUNTER — Emergency Department (HOSPITAL_COMMUNITY)
Admission: EM | Admit: 2018-10-04 | Discharge: 2018-10-04 | Disposition: A | Discharge status: Home / Self Care | Payer: Medicaid Other | Attending: Emergency Medicine | Admitting: Emergency Medicine

## 2018-10-04 ENCOUNTER — Encounter (HOSPITAL_COMMUNITY): Payer: Self-pay | Admitting: *Deleted

## 2018-10-04 DIAGNOSIS — I2510 Atherosclerotic heart disease of native coronary artery without angina pectoris: Secondary | ICD-10-CM | POA: Diagnosis not present

## 2018-10-04 DIAGNOSIS — S60423A Blister (nonthermal) of left middle finger, initial encounter: Secondary | ICD-10-CM | POA: Diagnosis present

## 2018-10-04 DIAGNOSIS — T23322A Burn of third degree of single left finger (nail) except thumb, initial encounter: Secondary | ICD-10-CM

## 2018-10-04 DIAGNOSIS — I10 Essential (primary) hypertension: Secondary | ICD-10-CM | POA: Diagnosis not present

## 2018-10-04 DIAGNOSIS — X58XXXA Exposure to other specified factors, initial encounter: Secondary | ICD-10-CM | POA: Diagnosis not present

## 2018-10-04 DIAGNOSIS — Z87891 Personal history of nicotine dependence: Secondary | ICD-10-CM | POA: Insufficient documentation

## 2018-10-04 DIAGNOSIS — Y998 Other external cause status: Secondary | ICD-10-CM | POA: Diagnosis not present

## 2018-10-04 DIAGNOSIS — Y9389 Activity, other specified: Secondary | ICD-10-CM | POA: Insufficient documentation

## 2018-10-04 DIAGNOSIS — X17XXXA Contact with hot engines, machinery and tools, initial encounter: Secondary | ICD-10-CM | POA: Insufficient documentation

## 2018-10-04 DIAGNOSIS — Y92015 Private garage of single-family (private) house as the place of occurrence of the external cause: Secondary | ICD-10-CM | POA: Diagnosis not present

## 2018-10-04 DIAGNOSIS — F1210 Cannabis abuse, uncomplicated: Secondary | ICD-10-CM | POA: Insufficient documentation

## 2018-10-04 DIAGNOSIS — Z23 Encounter for immunization: Secondary | ICD-10-CM | POA: Insufficient documentation

## 2018-10-04 DIAGNOSIS — Z79899 Other long term (current) drug therapy: Secondary | ICD-10-CM | POA: Diagnosis not present

## 2018-10-04 MED ORDER — TETANUS-DIPHTH-ACELL PERTUSSIS 5-2.5-18.5 LF-MCG/0.5 IM SUSP
0.5000 mL | Freq: Once | INTRAMUSCULAR | Status: AC
  Administered 2018-10-04: 0.5 mL via INTRAMUSCULAR
  Filled 2018-10-04: qty 0.5

## 2018-10-04 MED ORDER — SILVER SULFADIAZINE 1 % EX CREA
TOPICAL_CREAM | Freq: Once | CUTANEOUS | Status: AC
  Administered 2018-10-04: 1 via TOPICAL
  Filled 2018-10-04: qty 50

## 2018-10-04 NOTE — Telephone Encounter (Signed)
LMOVM for pt  Per slf order under her and send message letting know when patient is scheduled
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
## 2018-10-04 NOTE — ED Triage Notes (Signed)
Pt c/o blister and pain to left index finger that he woke up with two days ago, denies any injury,
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
## 2018-10-04 NOTE — Telephone Encounter (Signed)
Patient returned call, please call back  

## 2018-10-04 NOTE — Telephone Encounter (Signed)
Was going to call pt back, however, he is currently in ED at [facility].

## 2018-10-05 NOTE — Telephone Encounter (Signed)
LMOVM for pt 

## 2018-10-06 NOTE — Telephone Encounter (Signed)
FYI to [facility] unable to reach patient and letter mailed

## 2018-10-06 NOTE — Telephone Encounter (Signed)
Noted  

## 2018-10-06 NOTE — Telephone Encounter (Signed)
Also per staff message from [facility], once orders are placed, put under her name as ordering provider and send her a message letting her know once scheduled.

## 2018-10-06 NOTE — Telephone Encounter (Signed)
LETTER MAILED TO CALL OFFICE

## 2018-10-11 ENCOUNTER — Emergency Department (HOSPITAL_COMMUNITY): Payer: Medicaid Other

## 2018-10-11 ENCOUNTER — Emergency Department (HOSPITAL_COMMUNITY)
Admission: EM | Admit: 2018-10-11 | Discharge: 2018-10-12 | Disposition: A | Discharge status: Home / Self Care | Payer: Medicaid Other | Attending: Emergency Medicine | Admitting: Emergency Medicine

## 2018-10-11 ENCOUNTER — Other Ambulatory Visit: Payer: Self-pay

## 2018-10-11 ENCOUNTER — Encounter (HOSPITAL_COMMUNITY): Payer: Self-pay | Admitting: *Deleted

## 2018-10-11 DIAGNOSIS — S0101XA Laceration without foreign body of scalp, initial encounter: Secondary | ICD-10-CM | POA: Insufficient documentation

## 2018-10-11 DIAGNOSIS — I10 Essential (primary) hypertension: Secondary | ICD-10-CM | POA: Diagnosis not present

## 2018-10-11 DIAGNOSIS — Z87891 Personal history of nicotine dependence: Secondary | ICD-10-CM | POA: Insufficient documentation

## 2018-10-11 DIAGNOSIS — I2510 Atherosclerotic heart disease of native coronary artery without angina pectoris: Secondary | ICD-10-CM | POA: Diagnosis not present

## 2018-10-11 DIAGNOSIS — Y939 Activity, unspecified: Secondary | ICD-10-CM | POA: Diagnosis not present

## 2018-10-11 DIAGNOSIS — R569 Unspecified convulsions: Secondary | ICD-10-CM | POA: Diagnosis not present

## 2018-10-11 DIAGNOSIS — Y999 Unspecified external cause status: Secondary | ICD-10-CM | POA: Diagnosis not present

## 2018-10-11 DIAGNOSIS — Z79899 Other long term (current) drug therapy: Secondary | ICD-10-CM | POA: Diagnosis not present

## 2018-10-11 DIAGNOSIS — Y929 Unspecified place or not applicable: Secondary | ICD-10-CM | POA: Insufficient documentation

## 2018-10-11 DIAGNOSIS — W19XXXA Unspecified fall, initial encounter: Secondary | ICD-10-CM | POA: Diagnosis not present

## 2018-10-11 LAB — COMPREHENSIVE METABOLIC PANEL
ALK PHOS: 74 U/L (ref 38–126)
ALT: 24 U/L (ref 0–44)
AST: 23 U/L (ref 15–41)
Albumin: 4.1 g/dL (ref 3.5–5.0)
Anion gap: 9 (ref 5–15)
BUN: 13 mg/dL (ref 6–20)
CO2: 24 mmol/L (ref 22–32)
Calcium: 8.8 mg/dL — ABNORMAL LOW (ref 8.9–10.3)
Chloride: 103 mmol/L (ref 98–111)
Creatinine, Ser: 0.95 mg/dL (ref 0.61–1.24)
GFR calc Af Amer: >60 mL/min (ref >60)
GFR calc non Af Amer: >60 mL/min (ref >60)
Glucose, Bld: 95 mg/dL (ref 70–99)
Potassium: 3.5 mmol/L (ref 3.5–5.1)
Sodium: 136 mmol/L (ref 135–145)
Total Bilirubin: 0.2 mg/dL — ABNORMAL LOW (ref 0.3–1.2)
Total Protein: 6.6 g/dL (ref 6.5–8.1)

## 2018-10-11 LAB — CBC WITH DIFFERENTIAL/PLATELET
Abs Immature Granulocytes: 0.03 10*3/uL (ref 0.00–0.07)
Basophils Absolute: 0 10*3/uL (ref 0.0–0.1)
Basophils Relative: 0 %
Eosinophils Absolute: 0.4 10*3/uL (ref 0.0–0.5)
Eosinophils Relative: 6 %
HEMATOCRIT: 41.1 % (ref 39.0–52.0)
HEMOGLOBIN: 14.1 g/dL (ref 13.0–17.0)
Immature Granulocytes: 0 %
Lymphocytes Relative: 34 %
Lymphs Abs: 2.6 10*3/uL (ref 0.7–4.0)
MCH: 31 pg (ref 26.0–34.0)
MCHC: 34.3 g/dL (ref 30.0–36.0)
MCV: 90.3 fL (ref 80.0–100.0)
MONOS PCT: 7 %
Monocytes Absolute: 0.6 10*3/uL (ref 0.1–1.0)
Neutro Abs: 4 10*3/uL (ref 1.7–7.7)
Neutrophils Relative %: 53 %
Platelets: 206 10*3/uL (ref 150–400)
RBC: 4.55 MIL/uL (ref 4.22–5.81)
RDW: 13.3 % (ref 11.5–15.5)
WBC: 7.7 10*3/uL (ref 4.0–10.5)
nRBC: 0 % (ref 0.0–0.2)

## 2018-10-11 LAB — CARBAMAZEPINE LEVEL, TOTAL: Carbamazepine Lvl: <2 ug/mL — ABNORMAL LOW (ref 4.0–12.0)

## 2018-10-11 LAB — LITHIUM LEVEL: Lithium Lvl: 0.62 mmol/L (ref 0.60–1.20)

## 2018-10-11 MED ORDER — OXCARBAZEPINE 150 MG PO TABS
150.0000 mg | ORAL_TABLET | Freq: Two times a day (BID) | ORAL | Status: DC
  Filled 2018-10-11 (×2): qty 1

## 2018-10-11 MED ORDER — OXCARBAZEPINE 150 MG PO TABS
150.0000 mg | ORAL_TABLET | Freq: Once | ORAL | Status: AC
  Administered 2018-10-11: 150 mg via ORAL
  Filled 2018-10-11: qty 1

## 2018-10-11 MED ORDER — LIDOCAINE HCL (PF) 1 % IJ SOLN
2.0000 mL | Freq: Once | INTRAMUSCULAR | Status: DC
  Filled 2018-10-11: qty 2

## 2018-10-11 MED ORDER — LORAZEPAM 2 MG/ML IJ SOLN
1.0000 mg | Freq: Once | INTRAMUSCULAR | Status: AC
  Administered 2018-10-11: 1 mg via INTRAVENOUS
  Filled 2018-10-11: qty 1
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
## 2018-10-11 NOTE — Discharge Instructions (Addendum)
°  Please keep your scheduled appointment with your neurologist for next week.  Continue to take home medications as prescribed.Your lab work today showed a low level of Carbamazepine so you were given your normal evening dose of that while you were in the emergency department.     No driving, swimming, or other activites that may be dangerous if you suddenly had another seizure.  Do not use illicit substances, as they may be contributing to the seizure.  Return to the ER for worsening condition.    Avoid alcoholic beverages.  For your safety AND the safety of others, DO NOT drive a motor vehicle or operate any other heavy equipment until you are cleared to do so by your regular doctor or neurologist!  [place] [facility] [place]:      If you have another seizure.     If you have a fever, especially along with a headache or neck stiffness.  If you develop symptoms of Shortness of Breath, Chest Pain, Swelling of lips, mouth or tongue or if your condition becomes worse with any new symptoms, see your doctor or return to the Emergency Department for immediate care. Emergency services are not intended to be a substitute for comprehensive medical attention.  Please contact your doctor for follow up if not improving as expected.   Call your doctor in 5-7 days or as directed if there is no improvement.   Our doctors and staff appreciate your choosing [place] for your emergency medical care needs. We are here to serve you.

## 2018-10-11 NOTE — ED Provider Notes (Signed)
Medical screening examination/treatment/procedure(s) were conducted as a shared visit with non-physician practitioner(s) and myself.  I personally evaluated the patient during the encounter.  Clinical Impression:   Final diagnoses:  Seizure-like activity [facility])    The patient is a 38 year old male, presents after having a seizure that was witnessed by family members at home, he was at home with his 2 children 1 age 28, 82 age 62, he had 2 seizures 1 of them caused him to fall and hit his head on a table that had more seizure-like activity.  This was not seen by the paramedics nor was it seen in the emergency department.  He had a normal blood glucose and was alert and oriented very quickly after the seizure.  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  He currently takes Trileptal, he is also on lithium, on my exam he does have a very superficial laceration to the right frontal scalp, very superficial laceration to the thumb, no active bleeding, normal neurologic exam with his level of alertness ability to follow commands and symmetry of his face arms and legs with strength sensation and no facial droop.  He does have some disconjugate gaze which is baseline for the patient.  CT scan, labs, anticipate discharge if all is negative.  He does report to me that it is not unusual for him to have up to 5 seizures in the day but then to go multiple days without a seizure.  The reason he came today was because of the head injury.  Pt is subtherapeutic on his meds - given prior to dc - no further seizures   Noemi Chapel, MD 10/12/18 1039
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
## 2018-10-14 ENCOUNTER — Ambulatory Visit: Payer: Medicaid Other | Attending: Internal Medicine | Admitting: Neurology

## 2018-10-14 DIAGNOSIS — R5383 Other fatigue: Secondary | ICD-10-CM

## 2018-10-14 DIAGNOSIS — G4730 Sleep apnea, unspecified: Secondary | ICD-10-CM

## 2018-10-15 ENCOUNTER — Emergency Department (HOSPITAL_COMMUNITY)
Admission: EM | Admit: 2018-10-15 | Discharge: 2018-10-15 | Disposition: A | Discharge status: Home / Self Care | Payer: Medicaid Other | Attending: Emergency Medicine | Admitting: Emergency Medicine

## 2018-10-15 ENCOUNTER — Encounter (HOSPITAL_COMMUNITY): Payer: Self-pay | Admitting: Emergency Medicine

## 2018-10-15 DIAGNOSIS — S61209A Unspecified open wound of unspecified finger without damage to nail, initial encounter: Secondary | ICD-10-CM

## 2018-10-15 DIAGNOSIS — Z79899 Other long term (current) drug therapy: Secondary | ICD-10-CM | POA: Diagnosis not present

## 2018-10-15 DIAGNOSIS — S61200A Unspecified open wound of right index finger without damage to nail, initial encounter: Secondary | ICD-10-CM | POA: Insufficient documentation

## 2018-10-15 DIAGNOSIS — Y929 Unspecified place or not applicable: Secondary | ICD-10-CM | POA: Insufficient documentation

## 2018-10-15 DIAGNOSIS — I2510 Atherosclerotic heart disease of native coronary artery without angina pectoris: Secondary | ICD-10-CM | POA: Diagnosis not present

## 2018-10-15 DIAGNOSIS — Z87891 Personal history of nicotine dependence: Secondary | ICD-10-CM | POA: Insufficient documentation

## 2018-10-15 DIAGNOSIS — Y939 Activity, unspecified: Secondary | ICD-10-CM | POA: Diagnosis not present

## 2018-10-15 DIAGNOSIS — I10 Essential (primary) hypertension: Secondary | ICD-10-CM | POA: Diagnosis not present

## 2018-10-15 DIAGNOSIS — X58XXXA Exposure to other specified factors, initial encounter: Secondary | ICD-10-CM | POA: Diagnosis not present

## 2018-10-15 DIAGNOSIS — Y999 Unspecified external cause status: Secondary | ICD-10-CM | POA: Diagnosis not present

## 2018-10-15 MED ORDER — BACITRACIN-NEOMYCIN-POLYMYXIN 400-5-5000 EX OINT
TOPICAL_OINTMENT | Freq: Once | CUTANEOUS | Status: AC
  Administered 2018-10-15: 08:00:00 via TOPICAL
  Filled 2018-10-15: qty 1

## 2018-10-15 MED ORDER — NAPROXEN 500 MG PO TABS: 500.0000 mg | ORAL_TABLET | Freq: Two times a day (BID) | ORAL | 0 refills | Status: DC

## 2018-10-15 MED ORDER — MUPIROCIN 2 % EX OINT: 1.0000 "application " | TOPICAL_OINTMENT | Freq: Two times a day (BID) | CUTANEOUS | 0 refills | Status: DC
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
## 2018-10-15 NOTE — ED Notes (Signed)
Left index finger with open area. Granulating wound bed with macerated edges
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
## 2018-10-19 ENCOUNTER — Ambulatory Visit: Payer: Medicaid Other | Admitting: Neurology

## 2018-10-19 ENCOUNTER — Encounter: Payer: Self-pay | Admitting: Neurology

## 2018-10-19 VITALS — BP 131/83 | HR 77 | Ht 74.0 in | Wt 228.0 lb

## 2018-10-19 DIAGNOSIS — R569 Unspecified convulsions: Secondary | ICD-10-CM | POA: Diagnosis not present
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
## 2018-10-26 ENCOUNTER — Other Ambulatory Visit (HOSPITAL_COMMUNITY): Payer: Self-pay | Admitting: Neurology

## 2018-10-26 ENCOUNTER — Ambulatory Visit (HOSPITAL_COMMUNITY)
Admission: RE | Admit: 2018-10-26 | Discharge: 2018-10-26 | Disposition: A | Discharge status: Home / Self Care | Payer: Medicaid Other | Source: Ambulatory Visit | Attending: Neurology | Admitting: Neurology

## 2018-10-26 DIAGNOSIS — M25561 Pain in right knee: Secondary | ICD-10-CM | POA: Diagnosis present

## 2018-10-26 DIAGNOSIS — M5440 Lumbago with sciatica, unspecified side: Secondary | ICD-10-CM | POA: Diagnosis not present

## 2018-11-02 NOTE — Procedures (Signed)
[place] A. Merlene Laughter, MD     [email or web address]             NOCTURNAL POLYSOMNOGRAPHY   LOCATION: [facility]  Patient Name: Jermaine [name], Jermaine [name] Date: 10/14/2018 Gender: Male D.O.B: [birth year] Age (years): 37 Referring Provider: Delphina Cahill Height (inches): 72 Interpreting Physician: Phillips Odor MD, ABSM Weight (lbs): 156 RPSGT: Peak, Robert BMI: 21 MRN: [id number] Neck Size: 18.50 CLINICAL INFORMATION Sleep Study Type: NPSG     Indication for sleep study: Fatigue     Epworth Sleepiness Score: 8     SLEEP STUDY TECHNIQUE As per the AASM Manual for the Scoring of Sleep and Associated Events v2.3 (April 2016) with a hypopnea requiring 4% desaturations.  The channels recorded and monitored were frontal, central and occipital EEG, electrooculogram (EOG), submentalis EMG (chin), nasal and oral airflow, thoracic and abdominal wall motion, anterior tibialis EMG, snore microphone, electrocardiogram, and pulse oximetry.  MEDICATIONS Medications self-administered by patient taken the night of the study : N/A  Current Outpatient Medications:  .  cloNIDine (CATAPRES) 0.1 MG tablet, Take 1 tablet (0.1 mg total) by mouth 3 (three) times daily. For high blood pressure, Disp: 9 tablet, Rfl: 0 .  desvenlafaxine (PRISTIQ) 50 MG 24 hr tablet, Take 1 tablet (50 mg total) by mouth daily. For depression, Disp: 30 tablet, Rfl: 0 .  gabapentin (NEURONTIN) 300 MG capsule, Take 2 capsules (600 mg total) by mouth 3 (three) times daily. For agitation, Disp: 180 capsule, Rfl: 0 .  hydrOXYzine (ATARAX/VISTARIL) 25 MG tablet, Take 1 tablet (25 mg total) by mouth every 6 (six) hours as needed for anxiety., Disp: 60 tablet, Rfl: 0 .  levothyroxine (SYNTHROID, LEVOTHROID) 50 MCG tablet, Take 50 mcg by mouth daily before breakfast., Disp: , Rfl:  .  linaclotide (LINZESS) 290 MCG CAPS capsule, Take 1 capsule (290 mcg total) by mouth daily. For constipation, Disp: 1 capsule, Rfl:  0 .  lithium carbonate 300 MG capsule, Take 1 tablet (300 mg) by mouth in the morning and 2 tablets (600 mg) at bedtime: mood stabilization (Patient taking differently: Take 300-600 mg by mouth See admin instructions. Take 1 tablet (300 mg) by mouth in the morning and 2 tablets (600 mg) at bedtime: mood stabilization), Disp: 90 capsule, Rfl: 0 .  metoprolol tartrate (LOPRESSOR) 50 MG tablet, Take 1 tablet (50 mg total) by mouth 2 (two) times daily. For high blood pressure, Disp: 10 tablet, Rfl: 0 .  mupirocin ointment (BACTROBAN) 2 %, Apply 1 application topically 2 (two) times daily., Disp: 30 g, Rfl: 0 .  naproxen (NAPROSYN) 500 MG tablet, Take 1 tablet (500 mg total) by mouth 2 (two) times daily., Disp: 10 tablet, Rfl: 0 .  nicotine polacrilex (NICORETTE) 2 MG gum, Take 1 each (2 mg total) by mouth as needed for smoking cessation. (May buy from over the counter): nicotine withdrawal, Disp: 100 tablet, Rfl: 0 .  omeprazole (PRILOSEC) 20 MG capsule, Take 1 capsule (20 mg total) by mouth daily. For acid reflux, Disp: 5 capsule, Rfl: 0 .  ondansetron (ZOFRAN) 4 MG tablet, Take 1 tablet (4 mg total) by mouth every 4 (four) hours as needed. For nausea, Disp: 1 tablet, Rfl: 0 .  OXcarbazepine (TRILEPTAL) 150 MG tablet, Take 1 tablet (150 mg total) by mouth 2 (two) times daily. For seizures/mood stabilization, Disp: 60 tablet, Rfl: 0 .  polyethylene glycol powder (GLYCOLAX/MIRALAX) powder, Take one capful (17 grams) twice daily for constipation., Disp: 527 g, Rfl: 0 .  QUEtiapine (  SEROQUEL) 400 MG tablet, Take 1 tablet (400 mg total) by mouth at bedtime. For mood control, Disp: 30 tablet, Rfl: 0 .  simvastatin (ZOCOR) 10 MG tablet, Take 1 tablet (10 mg total) by mouth daily at 6 PM. For high cholesterol (Patient taking differently: Take 20 mg by mouth daily at 6 PM. For high cholesterol), Disp: 5 tablet, Rfl: 0 .  traZODone (DESYREL) 100 MG tablet, Take 100-200 mg by mouth at bedtime., Disp: , Rfl:       SLEEP ARCHITECTURE The study was initiated at 10:47:09 PM and ended at 4:49:42 AM.  Sleep onset time was 77.6 minutes and the sleep efficiency was 73.1%%. The total sleep time was 265 minutes.  Stage REM latency was 89.5 minutes.  The patient spent 2.1%% of the night in stage N1 sleep, 46.2%% in stage N2 sleep, 20.8%% in stage N3 and 30.9% in REM.  Alpha intrusion was absent.  Supine sleep was 100.00%.  RESPIRATORY PARAMETERS The overall apnea/hypopnea index (AHI) was 4.5 per hour. There were 0 total apneas, including 0 obstructive, 0 central and 0 mixed apneas. There were 20 hypopneas and 22 RERAs.  The AHI during Stage REM sleep was 12.4 per hour.  AHI while supine was 4.5 per hour.  The mean oxygen saturation was 92.8%. The minimum SpO2 during sleep was 88.0%.  moderate snoring was noted during this study.  CARDIAC DATA The 2 lead EKG demonstrated sinus rhythm. The mean heart rate was 68.9 beats per minute. Other EKG findings include: None.  LEG MOVEMENT DATA The total PLMS were 0 with a resulting PLMS index of 0.0. Associated arousal with leg movement index was 0.0.  IMPRESSIONS No significant obstructive sleep apnea occurred during this study. No significant central sleep apnea occurred during this study.   Delano Metz, MD Diplomate, American Board of Sleep Medicine.  ELECTRONICALLY SIGNED ON:  11/02/2018, 11:00 AM [place] PH: [phone]   FX: [phone] [place]

## 2018-11-15 ENCOUNTER — Encounter (HOSPITAL_COMMUNITY): Payer: Self-pay

## 2018-11-15 ENCOUNTER — Emergency Department (HOSPITAL_COMMUNITY): Payer: Medicaid Other

## 2018-11-15 ENCOUNTER — Other Ambulatory Visit: Payer: Self-pay

## 2018-11-15 ENCOUNTER — Emergency Department (HOSPITAL_COMMUNITY)
Admission: EM | Admit: 2018-11-15 | Discharge: 2018-11-16 | Disposition: A | Discharge status: Home / Self Care | Payer: Medicaid Other | Attending: Emergency Medicine | Admitting: Emergency Medicine

## 2018-11-15 DIAGNOSIS — R200 Anesthesia of skin: Secondary | ICD-10-CM | POA: Insufficient documentation

## 2018-11-15 DIAGNOSIS — S0101XA Laceration without foreign body of scalp, initial encounter: Secondary | ICD-10-CM | POA: Insufficient documentation

## 2018-11-15 DIAGNOSIS — I10 Essential (primary) hypertension: Secondary | ICD-10-CM | POA: Insufficient documentation

## 2018-11-15 DIAGNOSIS — R51 Headache: Secondary | ICD-10-CM | POA: Diagnosis not present

## 2018-11-15 DIAGNOSIS — Z87891 Personal history of nicotine dependence: Secondary | ICD-10-CM | POA: Diagnosis not present

## 2018-11-15 DIAGNOSIS — S0990XA Unspecified injury of head, initial encounter: Secondary | ICD-10-CM

## 2018-11-15 DIAGNOSIS — Z79899 Other long term (current) drug therapy: Secondary | ICD-10-CM | POA: Diagnosis not present

## 2018-11-15 DIAGNOSIS — W19XXXA Unspecified fall, initial encounter: Secondary | ICD-10-CM

## 2018-11-15 DIAGNOSIS — M5417 Radiculopathy, lumbosacral region: Secondary | ICD-10-CM | POA: Insufficient documentation

## 2018-11-15 DIAGNOSIS — K5900 Constipation, unspecified: Secondary | ICD-10-CM | POA: Insufficient documentation

## 2018-11-15 DIAGNOSIS — R569 Unspecified convulsions: Secondary | ICD-10-CM | POA: Diagnosis not present

## 2018-11-15 DIAGNOSIS — Y92019 Unspecified place in single-family (private) house as the place of occurrence of the external cause: Secondary | ICD-10-CM | POA: Diagnosis not present

## 2018-11-15 DIAGNOSIS — W132XXA Fall from, out of or through roof, initial encounter: Secondary | ICD-10-CM | POA: Diagnosis not present

## 2018-11-15 DIAGNOSIS — I2510 Atherosclerotic heart disease of native coronary artery without angina pectoris: Secondary | ICD-10-CM | POA: Insufficient documentation

## 2018-11-15 DIAGNOSIS — Y999 Unspecified external cause status: Secondary | ICD-10-CM | POA: Diagnosis not present

## 2018-11-15 DIAGNOSIS — Q249 Congenital malformation of heart, unspecified: Secondary | ICD-10-CM | POA: Diagnosis not present

## 2018-11-15 DIAGNOSIS — Y9389 Activity, other specified: Secondary | ICD-10-CM | POA: Diagnosis not present

## 2018-11-15 LAB — LITHIUM LEVEL: Lithium Lvl: 0.74 mmol/L (ref 0.60–1.20)

## 2018-11-15 LAB — CBC WITH DIFFERENTIAL/PLATELET
Abs Immature Granulocytes: 0.02 10*3/uL (ref 0.00–0.07)
Basophils Absolute: 0 10*3/uL (ref 0.0–0.1)
Basophils Relative: 0 %
Eosinophils Absolute: 0.3 10*3/uL (ref 0.0–0.5)
Eosinophils Relative: 4 %
HCT: 43.6 % (ref 39.0–52.0)
HEMOGLOBIN: 14.4 g/dL (ref 13.0–17.0)
Immature Granulocytes: 0 %
Lymphocytes Relative: 36 %
Lymphs Abs: 2.4 10*3/uL (ref 0.7–4.0)
MCH: 31.7 pg (ref 26.0–34.0)
MCHC: 33 g/dL (ref 30.0–36.0)
MCV: 96 fL (ref 80.0–100.0)
Monocytes Absolute: 0.5 10*3/uL (ref 0.1–1.0)
Monocytes Relative: 8 %
Neutro Abs: 3.4 10*3/uL (ref 1.7–7.7)
Neutrophils Relative %: 52 %
Platelets: 193 10*3/uL (ref 150–400)
RBC: 4.54 MIL/uL (ref 4.22–5.81)
RDW: 12.9 % (ref 11.5–15.5)
WBC: 6.6 10*3/uL (ref 4.0–10.5)
nRBC: 0 % (ref 0.0–0.2)

## 2018-11-15 LAB — BASIC METABOLIC PANEL
Anion gap: 6 (ref 5–15)
BUN: 13 mg/dL (ref 6–20)
CO2: 24 mmol/L (ref 22–32)
Calcium: 8.8 mg/dL — ABNORMAL LOW (ref 8.9–10.3)
Chloride: 107 mmol/L (ref 98–111)
Creatinine, Ser: 0.96 mg/dL (ref 0.61–1.24)
GFR calc Af Amer: >60 mL/min (ref >60)
GFR calc non Af Amer: >60 mL/min (ref >60)
Glucose, Bld: 81 mg/dL (ref 70–99)
Potassium: 4 mmol/L (ref 3.5–5.1)
Sodium: 137 mmol/L (ref 135–145)

## 2018-11-15 LAB — URINALYSIS, ROUTINE W REFLEX MICROSCOPIC
Bilirubin Urine: NEGATIVE
Glucose, UA: NEGATIVE mg/dL
Hgb urine dipstick: NEGATIVE
Ketones, ur: NEGATIVE mg/dL
Leukocytes,Ua: NEGATIVE
NITRITE: NEGATIVE
Protein, ur: NEGATIVE mg/dL
Specific Gravity, Urine: 1.016 (ref 1.005–1.030)
pH: 7 (ref 5.0–8.0)

## 2018-11-15 LAB — RAPID URINE DRUG SCREEN, HOSP PERFORMED
Amphetamines: NOT DETECTED
Barbiturates: NOT DETECTED
Benzodiazepines: NOT DETECTED
Cocaine: NOT DETECTED
Opiates: POSITIVE — AB
Tetrahydrocannabinol: NOT DETECTED

## 2018-11-15 LAB — ETHANOL: Alcohol, Ethyl (B): <10 mg/dL (ref <10)

## 2018-11-15 MED ORDER — MORPHINE SULFATE (PF) 4 MG/ML IV SOLN
INTRAVENOUS | Status: AC
  Administered 2018-11-15: 4 mg via INTRAVENOUS
  Filled 2018-11-15: qty 1

## 2018-11-15 MED ORDER — MORPHINE SULFATE (PF) 4 MG/ML IV SOLN
4.0000 mg | Freq: Once | INTRAVENOUS | Status: AC
  Administered 2018-11-15: 4 mg via INTRAVENOUS
  Filled 2018-11-15: qty 1

## 2018-11-15 MED ORDER — LIDOCAINE-EPINEPHRINE (PF) 2 %-1:200000 IJ SOLN
10.0000 mL | Freq: Once | INTRAMUSCULAR | Status: AC
  Administered 2018-11-15: 10 mL
  Filled 2018-11-15: qty 10

## 2018-11-15 MED ORDER — SODIUM CHLORIDE 0.9 % IV BOLUS
1000.0000 mL | Freq: Once | INTRAVENOUS | Status: AC
  Administered 2018-11-15: 1000 mL via INTRAVENOUS

## 2018-11-15 MED ORDER — IOHEXOL 300 MG/ML  SOLN
100.0000 mL | Freq: Once | INTRAMUSCULAR | Status: AC | PRN
  Administered 2018-11-15: 100 mL via INTRAVENOUS

## 2018-11-15 MED ORDER — HYDROMORPHONE HCL 1 MG/ML IJ SOLN
1.0000 mg | INTRAMUSCULAR | Status: DC | PRN
  Administered 2018-11-16: 1 mg via INTRAVENOUS
  Filled 2018-11-15: qty 1

## 2018-11-15 MED ORDER — MORPHINE SULFATE (PF) 4 MG/ML IV SOLN: 4.0000 mg | Freq: Once | INTRAVENOUS | Status: DC

## 2018-11-15 MED ORDER — MORPHINE SULFATE (PF) 4 MG/ML IV SOLN
4.0000 mg | Freq: Once | INTRAVENOUS | Status: AC
  Administered 2018-11-15 (×2): 4 mg via INTRAVENOUS
  Filled 2018-11-15: qty 1

## 2018-11-15 MED ORDER — ONDANSETRON HCL 4 MG/2ML IJ SOLN
4.0000 mg | Freq: Once | INTRAMUSCULAR | Status: AC
  Administered 2018-11-15: 4 mg via INTRAVENOUS
  Filled 2018-11-15: qty 2

## 2018-11-15 NOTE — ED Provider Notes (Signed)
Assumed care of patient after transfer from [facility] after fall from roof He is awake/alert, appears uncomfortable He had extensive imaging that was negative However, pt has continued back pain with left leg numbness He denies any arm weakness/numbness His cspine has been cleared prior to transfer He has diffuse T/L tenderness without stepoffs On exam, he has no focal weakness in right LE Left LE - he has weakness with foot plantar flexion and great toe extension and reports numbness to lateral aspect of left LE Will obtain MRI thoracic/lumbar spine Pt denies any contraindications to MRI    Ripley Fraise, MD 11/15/18 2345

## 2018-11-15 NOTE — ED Notes (Signed)
Long, MD at bedside with suture cart.
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
## 2018-11-15 NOTE — ED Triage Notes (Signed)
EMS reports pt has history of pseudoseizures and had a seizure while on a roof.  Pt fell approx 5 feet, hit a doghouse and fell onto the porch.  EMS says pt has small laceration to back of head.  Pt c/o lower back pain.  EMS says pt had a pseudoseizure enroute but was never disoriented or postictal.  Pt presently alert and oriented.

## 2018-11-15 NOTE — ED Notes (Signed)
Spoke with patient's wife. Wife asked about test results and patient's disposition. I told her that I could not see test results and was unsure of patient dispo at this time.

## 2018-11-16 ENCOUNTER — Emergency Department (HOSPITAL_COMMUNITY): Payer: Medicaid Other

## 2018-11-16 MED ORDER — OXYCODONE-ACETAMINOPHEN 5-325 MG PO TABS: 1.0000 | ORAL_TABLET | Freq: Three times a day (TID) | ORAL | 0 refills | Status: DC | PRN

## 2018-11-16 MED ORDER — OXYCODONE-ACETAMINOPHEN 5-325 MG PO TABS
1.0000 | ORAL_TABLET | Freq: Once | ORAL | Status: AC
  Administered 2018-11-16: 1 via ORAL
  Filled 2018-11-16: qty 1

## 2018-11-16 MED ORDER — LORAZEPAM 2 MG/ML IJ SOLN
1.0000 mg | Freq: Once | INTRAMUSCULAR | Status: AC
  Administered 2018-11-16: 1 mg via INTRAVENOUS
  Filled 2018-11-16: qty 1

## 2018-11-16 NOTE — ED Notes (Signed)
Pt given [place] sandwich and coke, ambulated to restroom independently. Pt called family for ride home at d/c

## 2018-11-16 NOTE — Discharge Instructions (Signed)
Please be aware you may have another seizure  Do not drive until seen by your physician for your condition  Do not climb ladders/roofs/trees as a seizure can occur at that height and cause serious harm  Do not bathe/swim alone as a seizure can occur and cause serious harm  Please followup with your physician or neurologist for further testing and possible treatment   SEEK IMMEDIATE MEDICAL ATTENTION IF: New numbness, tingling, weakness, or problem with the use of your arms or legs.  Severe back pain not relieved with medications.  Change in bowel or bladder control (if you lose control of stool or urine, or if you are unable to urinate) Increasing pain in any areas of the body (such as chest or abdominal pain).  Shortness of breath, dizziness or fainting.  Nausea (feeling sick to your stomach), vomiting, fever, or sweats.

## 2018-11-16 NOTE — ED Notes (Signed)
Patient transported to MRI 

## 2018-11-16 NOTE — ED Notes (Signed)
Patient verbalizes understanding of discharge instructions. Opportunity for questioning and answers were provided. Armband removed by staff, pt discharged from ED. Wheeled out to lobby  

## 2018-11-16 NOTE — ED Notes (Signed)
Pt returned from MRI, no distress noted.

## 2018-11-16 NOTE — ED Provider Notes (Signed)
MRI negative for any acute process.  Short course of pain medicine has been prescribed.  Patient gave permission to discuss findings with his wife via phone.  Patient to be referred to neurosurgery.  Patient will be ambulated, one-time dose of Percocet and to be discharged.   Ripley Fraise, MD 11/16/18 [phone]

## 2018-11-20 ENCOUNTER — Encounter (HOSPITAL_COMMUNITY): Payer: Self-pay | Admitting: *Deleted

## 2018-11-20 ENCOUNTER — Other Ambulatory Visit: Payer: Self-pay

## 2018-11-20 ENCOUNTER — Emergency Department (HOSPITAL_COMMUNITY)
Admission: EM | Admit: 2018-11-20 | Discharge: 2018-11-20 | Disposition: A | Discharge status: Home / Self Care | Payer: Medicaid Other | Attending: Emergency Medicine | Admitting: Emergency Medicine

## 2018-11-20 DIAGNOSIS — I10 Essential (primary) hypertension: Secondary | ICD-10-CM | POA: Diagnosis not present

## 2018-11-20 DIAGNOSIS — I2510 Atherosclerotic heart disease of native coronary artery without angina pectoris: Secondary | ICD-10-CM | POA: Insufficient documentation

## 2018-11-20 DIAGNOSIS — Z87891 Personal history of nicotine dependence: Secondary | ICD-10-CM | POA: Insufficient documentation

## 2018-11-20 DIAGNOSIS — Z79899 Other long term (current) drug therapy: Secondary | ICD-10-CM | POA: Insufficient documentation

## 2018-11-20 DIAGNOSIS — R569 Unspecified convulsions: Secondary | ICD-10-CM | POA: Diagnosis not present

## 2018-11-20 LAB — CBC
HCT: 44.7 % (ref 39.0–52.0)
Hemoglobin: 14.8 g/dL (ref 13.0–17.0)
MCH: 31.3 pg (ref 26.0–34.0)
MCHC: 33.1 g/dL (ref 30.0–36.0)
MCV: 94.5 fL (ref 80.0–100.0)
Platelets: 187 10*3/uL (ref 150–400)
RBC: 4.73 MIL/uL (ref 4.22–5.81)
RDW: 13 % (ref 11.5–15.5)
WBC: 7.7 10*3/uL (ref 4.0–10.5)
nRBC: 0 % (ref 0.0–0.2)

## 2018-11-20 LAB — BASIC METABOLIC PANEL
Anion gap: 7 (ref 5–15)
BUN: 12 mg/dL (ref 6–20)
CALCIUM: 9.1 mg/dL (ref 8.9–10.3)
CO2: 27 mmol/L (ref 22–32)
Chloride: 102 mmol/L (ref 98–111)
Creatinine, Ser: 1 mg/dL (ref 0.61–1.24)
GFR calc Af Amer: >60 mL/min (ref >60)
GFR calc non Af Amer: >60 mL/min (ref >60)
Glucose, Bld: 102 mg/dL — ABNORMAL HIGH (ref 70–99)
Potassium: 4.7 mmol/L (ref 3.5–5.1)
Sodium: 136 mmol/L (ref 135–145)
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
## 2018-11-20 NOTE — ED Triage Notes (Addendum)
Pt brought in by [facility] with c/o seizure today in his bathroom and hit his head. Denies LOC to EMS. Pt ripped open his stitches on his head during the seizure that were placed last week when fell off the roof of a house due to having a seizure. Pt has bipolar hx. Wife reports to EMS that pt has pseudoseizures whenever he gets upset. [name] wife is currently moving out and pt has been upset recently per wife. Emesis x 1 of clear liquid for EMS.

## 2018-11-20 NOTE — Discharge Instructions (Addendum)
Continue your current medications, follow-up with your neurologist, return as needed for worsening symptoms

## 2019-01-25 ENCOUNTER — Ambulatory Visit (INDEPENDENT_AMBULATORY_CARE_PROVIDER_SITE_OTHER): Payer: Medicaid Other | Admitting: Urology

## 2019-01-25 DIAGNOSIS — R102 Pelvic and perineal pain: Secondary | ICD-10-CM

## 2019-01-31 ENCOUNTER — Other Ambulatory Visit: Payer: Self-pay | Admitting: Urology

## 2019-02-01 ENCOUNTER — Ambulatory Visit: Payer: Medicaid Other | Admitting: Orthopedic Surgery

## 2019-02-06 ENCOUNTER — Other Ambulatory Visit: Payer: Self-pay

## 2019-02-06 ENCOUNTER — Encounter: Payer: Self-pay | Admitting: Orthopedic Surgery

## 2019-02-06 ENCOUNTER — Ambulatory Visit: Payer: Medicaid Other | Admitting: Orthopedic Surgery

## 2019-02-06 VITALS — BP 133/93 | HR 102 | Temp 97.4°F | Ht 74.0 in | Wt 212.0 lb

## 2019-02-06 DIAGNOSIS — M25561 Pain in right knee: Secondary | ICD-10-CM | POA: Diagnosis not present

## 2019-02-06 DIAGNOSIS — M2341 Loose body in knee, right knee: Secondary | ICD-10-CM | POA: Diagnosis not present

## 2019-02-06 DIAGNOSIS — G8929 Other chronic pain: Secondary | ICD-10-CM
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
## 2019-02-08 ENCOUNTER — Encounter (HOSPITAL_COMMUNITY): Payer: Self-pay

## 2019-02-08 ENCOUNTER — Emergency Department (HOSPITAL_COMMUNITY)
Admission: EM | Admit: 2019-02-08 | Discharge: 2019-02-08 | Disposition: A | Discharge status: Home / Self Care | Payer: Medicaid Other | Attending: Emergency Medicine | Admitting: Emergency Medicine

## 2019-02-08 ENCOUNTER — Other Ambulatory Visit: Payer: Self-pay

## 2019-02-08 ENCOUNTER — Emergency Department (HOSPITAL_COMMUNITY): Payer: Medicaid Other

## 2019-02-08 DIAGNOSIS — I10 Essential (primary) hypertension: Secondary | ICD-10-CM | POA: Insufficient documentation

## 2019-02-08 DIAGNOSIS — S0101XA Laceration without foreign body of scalp, initial encounter: Secondary | ICD-10-CM | POA: Diagnosis not present

## 2019-02-08 DIAGNOSIS — Z760 Encounter for issue of repeat prescription: Secondary | ICD-10-CM

## 2019-02-08 DIAGNOSIS — I2510 Atherosclerotic heart disease of native coronary artery without angina pectoris: Secondary | ICD-10-CM | POA: Insufficient documentation

## 2019-02-08 DIAGNOSIS — Y939 Activity, unspecified: Secondary | ICD-10-CM | POA: Insufficient documentation

## 2019-02-08 DIAGNOSIS — Y92009 Unspecified place in unspecified non-institutional (private) residence as the place of occurrence of the external cause: Secondary | ICD-10-CM | POA: Insufficient documentation

## 2019-02-08 DIAGNOSIS — Z79899 Other long term (current) drug therapy: Secondary | ICD-10-CM | POA: Diagnosis not present

## 2019-02-08 DIAGNOSIS — R569 Unspecified convulsions: Secondary | ICD-10-CM

## 2019-02-08 DIAGNOSIS — Y999 Unspecified external cause status: Secondary | ICD-10-CM | POA: Diagnosis not present

## 2019-02-08 DIAGNOSIS — Z87891 Personal history of nicotine dependence: Secondary | ICD-10-CM | POA: Insufficient documentation

## 2019-02-08 DIAGNOSIS — W01198A Fall on same level from slipping, tripping and stumbling with subsequent striking against other object, initial encounter: Secondary | ICD-10-CM | POA: Insufficient documentation

## 2019-02-08 LAB — BASIC METABOLIC PANEL
Anion gap: 10 (ref 5–15)
BUN: 11 mg/dL (ref 6–20)
CO2: 24 mmol/L (ref 22–32)
Calcium: 9.1 mg/dL (ref 8.9–10.3)
Chloride: 104 mmol/L (ref 98–111)
Creatinine, Ser: 1 mg/dL (ref 0.61–1.24)
GFR calc Af Amer: >60 mL/min (ref >60)
GFR calc non Af Amer: >60 mL/min (ref >60)
Glucose, Bld: 101 mg/dL — ABNORMAL HIGH (ref 70–99)
Potassium: 3.8 mmol/L (ref 3.5–5.1)
Sodium: 138 mmol/L (ref 135–145)

## 2019-02-08 LAB — LITHIUM LEVEL: Lithium Lvl: 0.56 mmol/L — ABNORMAL LOW (ref 0.60–1.20)

## 2019-02-08 LAB — CBC WITH DIFFERENTIAL/PLATELET
Abs Immature Granulocytes: 0.02 10*3/uL (ref 0.00–0.07)
Basophils Absolute: 0 10*3/uL (ref 0.0–0.1)
Basophils Relative: 0 %
Eosinophils Absolute: 0.1 10*3/uL (ref 0.0–0.5)
Eosinophils Relative: 1 %
HCT: 42.3 % (ref 39.0–52.0)
Hemoglobin: 14.9 g/dL (ref 13.0–17.0)
Immature Granulocytes: 0 %
Lymphocytes Relative: 26 %
Lymphs Abs: 2.4 10*3/uL (ref 0.7–4.0)
MCH: 32.5 pg (ref 26.0–34.0)
MCHC: 35.2 g/dL (ref 30.0–36.0)
MCV: 92.2 fL (ref 80.0–100.0)
Monocytes Absolute: 0.5 10*3/uL (ref 0.1–1.0)
Monocytes Relative: 5 %
Neutro Abs: 6.1 10*3/uL (ref 1.7–7.7)
Neutrophils Relative %: 68 %
Platelets: 152 10*3/uL (ref 150–400)
RBC: 4.59 MIL/uL (ref 4.22–5.81)
RDW: 12.8 % (ref 11.5–15.5)
WBC: 9.1 10*3/uL (ref 4.0–10.5)
nRBC: 0 % (ref 0.0–0.2)

## 2019-02-08 LAB — CBG MONITORING, ED: Glucose-Capillary: 100 mg/dL — ABNORMAL HIGH (ref 70–99)

## 2019-02-08 MED ORDER — LORAZEPAM 2 MG/ML IJ SOLN
1.0000 mg | Freq: Once | INTRAMUSCULAR | Status: AC
  Administered 2019-02-08: 1 mg via INTRAVENOUS
  Filled 2019-02-08: qty 1

## 2019-02-08 MED ORDER — OXYCODONE-ACETAMINOPHEN 5-325 MG PO TABS
2.0000 | ORAL_TABLET | Freq: Once | ORAL | Status: AC
  Administered 2019-02-08: 2 via ORAL
  Filled 2019-02-08: qty 2

## 2019-02-08 MED ORDER — OXYCODONE-ACETAMINOPHEN 5-325 MG PO TABS
1.0000 | ORAL_TABLET | Freq: Once | ORAL | Status: AC
  Administered 2019-02-08: 1 via ORAL
  Filled 2019-02-08: qty 1

## 2019-02-08 MED ORDER — METOPROLOL TARTRATE 50 MG PO TABS: 50.0000 mg | ORAL_TABLET | Freq: Two times a day (BID) | ORAL | 0 refills | Status: DC

## 2019-02-08 MED ORDER — ONDANSETRON HCL 4 MG/2ML IJ SOLN
4.0000 mg | Freq: Once | INTRAMUSCULAR | Status: AC
  Administered 2019-02-08: 4 mg via INTRAVENOUS
  Filled 2019-02-08: qty 2

## 2019-02-08 MED ORDER — LIDOCAINE-EPINEPHRINE (PF) 1 %-1:200000 IJ SOLN
10.0000 mL | Freq: Once | INTRAMUSCULAR | Status: AC
  Administered 2019-02-08: 30 mL
  Filled 2019-02-08: qty 30

## 2019-02-08 MED ORDER — OXCARBAZEPINE 150 MG PO TABS: 150.0000 mg | ORAL_TABLET | Freq: Two times a day (BID) | ORAL | 0 refills | Status: DC

## 2019-02-08 MED ORDER — METOPROLOL TARTRATE 50 MG PO TABS
50.0000 mg | ORAL_TABLET | Freq: Once | ORAL | Status: AC
  Administered 2019-02-08: 50 mg via ORAL
  Filled 2019-02-08: qty 1

## 2019-02-08 NOTE — ED Notes (Signed)
Pt presently alert and oriented.  EMS reports pt did not appear to be postictal upon their arrival.

## 2019-02-08 NOTE — Discharge Instructions (Signed)
You have been prescribed a small quantity of the medications you are out of to help you until you can get further refills by your doctor. Have your staples removed in 10 days.  Keep your wound clean and dry,  Until a good scab forms - you may then wash gently twice daily with mild soap and water, but dry completely after.  Get rechecked for any sign of infection (redness,  Swelling,  Increased pain or drainage of purulent fluid).

## 2019-02-08 NOTE — ED Notes (Signed)
Ems reports pt has laceration to back of head from falling and hitting dresser at his house.

## 2019-02-08 NOTE — ED Triage Notes (Signed)
Ems reports pt had witnessed seizure at home pta.  Pt says has been out of all of his medications for the past 2 weeks.  Pt says had 2 seizures last night.  C/O generalized body aches.  Pt says usually hurts all over after having a  Seizure.

## 2019-02-08 NOTE — ED Notes (Signed)
Pt returned from CT °
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
## 2019-02-09 ENCOUNTER — Encounter (HOSPITAL_COMMUNITY): Payer: Self-pay | Admitting: Emergency Medicine

## 2019-02-09 ENCOUNTER — Emergency Department (HOSPITAL_COMMUNITY)
Admission: EM | Admit: 2019-02-09 | Discharge: 2019-02-09 | Disposition: A | Discharge status: Home / Self Care | Payer: Medicaid Other | Attending: Emergency Medicine | Admitting: Emergency Medicine

## 2019-02-09 ENCOUNTER — Telehealth: Payer: Self-pay | Admitting: Radiology

## 2019-02-09 ENCOUNTER — Other Ambulatory Visit: Payer: Self-pay

## 2019-02-09 ENCOUNTER — Emergency Department (HOSPITAL_COMMUNITY): Payer: Medicaid Other

## 2019-02-09 DIAGNOSIS — Z87891 Personal history of nicotine dependence: Secondary | ICD-10-CM | POA: Insufficient documentation

## 2019-02-09 DIAGNOSIS — Y9389 Activity, other specified: Secondary | ICD-10-CM | POA: Insufficient documentation

## 2019-02-09 DIAGNOSIS — S0990XA Unspecified injury of head, initial encounter: Secondary | ICD-10-CM | POA: Diagnosis present

## 2019-02-09 DIAGNOSIS — Y92009 Unspecified place in unspecified non-institutional (private) residence as the place of occurrence of the external cause: Secondary | ICD-10-CM | POA: Insufficient documentation

## 2019-02-09 DIAGNOSIS — S0003XA Contusion of scalp, initial encounter: Secondary | ICD-10-CM | POA: Diagnosis not present

## 2019-02-09 DIAGNOSIS — S0101XD Laceration without foreign body of scalp, subsequent encounter: Secondary | ICD-10-CM | POA: Diagnosis not present

## 2019-02-09 DIAGNOSIS — I10 Essential (primary) hypertension: Secondary | ICD-10-CM | POA: Insufficient documentation

## 2019-02-09 DIAGNOSIS — Y999 Unspecified external cause status: Secondary | ICD-10-CM | POA: Diagnosis not present

## 2019-02-09 DIAGNOSIS — Z79899 Other long term (current) drug therapy: Secondary | ICD-10-CM | POA: Diagnosis not present

## 2019-02-09 DIAGNOSIS — R569 Unspecified convulsions: Secondary | ICD-10-CM

## 2019-02-09 DIAGNOSIS — Q249 Congenital malformation of heart, unspecified: Secondary | ICD-10-CM | POA: Insufficient documentation

## 2019-02-09 DIAGNOSIS — W01198A Fall on same level from slipping, tripping and stumbling with subsequent striking against other object, initial encounter: Secondary | ICD-10-CM | POA: Diagnosis not present

## 2019-02-09 DIAGNOSIS — I2510 Atherosclerotic heart disease of native coronary artery without angina pectoris: Secondary | ICD-10-CM | POA: Diagnosis not present

## 2019-02-09 LAB — BASIC METABOLIC PANEL
Anion gap: 11 (ref 5–15)
BUN: 10 mg/dL (ref 6–20)
CO2: 24 mmol/L (ref 22–32)
Calcium: 9.1 mg/dL (ref 8.9–10.3)
Chloride: 102 mmol/L (ref 98–111)
Creatinine, Ser: 1.02 mg/dL (ref 0.61–1.24)
GFR calc Af Amer: >60 mL/min (ref >60)
GFR calc non Af Amer: >60 mL/min (ref >60)
Glucose, Bld: 83 mg/dL (ref 70–99)
Potassium: 3.8 mmol/L (ref 3.5–5.1)
Sodium: 137 mmol/L (ref 135–145)

## 2019-02-09 LAB — CARBAMAZEPINE LEVEL, TOTAL: Carbamazepine Lvl: <2 ug/mL — ABNORMAL LOW (ref 4.0–12.0)

## 2019-02-09 MED ORDER — OXYCODONE-ACETAMINOPHEN 5-325 MG PO TABS
1.0000 | ORAL_TABLET | Freq: Once | ORAL | Status: AC
  Administered 2019-02-09: 1 via ORAL
  Filled 2019-02-09: qty 1

## 2019-02-09 MED ORDER — OXCARBAZEPINE 150 MG PO TABS
150.0000 mg | ORAL_TABLET | Freq: Two times a day (BID) | ORAL | Status: DC
  Administered 2019-02-09: 150 mg via ORAL
  Filled 2019-02-09 (×2): qty 1

## 2019-02-09 NOTE — ED Notes (Signed)
Pt back from CT
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
## 2019-02-09 NOTE — Discharge Instructions (Addendum)
Continue to take your trileptal as prescribed along with your other medications.  It is important for you to not drive a vehicle until you have been seizure free for 6 months and will need to be cleared by your doctor in order to safely drive a vehicle.  Your labs and Ct scans are normal tonight.

## 2019-02-09 NOTE — ED Notes (Signed)
Patient transported to CT 

## 2019-02-09 NOTE — ED Notes (Signed)
Upon walking from ED, registration states pt is having a seizure. Pt alert and oriented with no seizure like activity upon my arrival to pt. Pt states he hit his head on the wall. Pt was able to stand and get on stretcher.

## 2019-02-09 NOTE — ED Triage Notes (Signed)
Per EMS patient called out for seizure at home. Patient states he fell and busted his staples loose. EMS reported that patient had an episode in the ambulance of violent shaking but was immediately conscious with no post ictal state.

## 2019-02-09 NOTE — Telephone Encounter (Signed)
Called patient about MRI scan May 28th at 10 am arrive at 59  Voice mail full He needs virtual appt to review.

## 2019-02-09 NOTE — ED Notes (Signed)
Called ac for med 

## 2019-02-09 NOTE — ED Notes (Signed)
Pt to restroom

## 2019-02-09 NOTE — ED Notes (Signed)
Pt has small hematoma to back of head.

## 2019-02-14 NOTE — Telephone Encounter (Signed)
I have advised him of appointment time/ date Dr Aline Brochure will call him with report, states do not need to make virtual appt.

## 2019-02-16 ENCOUNTER — Ambulatory Visit (HOSPITAL_COMMUNITY): Admission: RE | Admit: 2019-02-16 | Payer: Medicaid Other | Source: Ambulatory Visit

## 2019-02-17 ENCOUNTER — Ambulatory Visit (HOSPITAL_COMMUNITY)
Admission: RE | Admit: 2019-02-17 | Discharge: 2019-02-17 | Disposition: A | Discharge status: Home / Self Care | Payer: Medicaid Other | Source: Ambulatory Visit | Attending: Orthopedic Surgery | Admitting: Orthopedic Surgery

## 2019-02-17 ENCOUNTER — Other Ambulatory Visit: Payer: Self-pay

## 2019-02-17 DIAGNOSIS — G8929 Other chronic pain: Secondary | ICD-10-CM | POA: Insufficient documentation

## 2019-02-17 DIAGNOSIS — M25561 Pain in right knee: Secondary | ICD-10-CM | POA: Diagnosis present

## 2019-02-22 ENCOUNTER — Telehealth: Payer: Self-pay | Admitting: Orthopedic Surgery

## 2019-02-22 ENCOUNTER — Other Ambulatory Visit: Payer: Self-pay | Admitting: Orthopedic Surgery

## 2019-02-22 ENCOUNTER — Telehealth: Payer: Self-pay | Admitting: Radiology

## 2019-02-22 NOTE — Telephone Encounter (Signed)
Done

## 2019-02-22 NOTE — Telephone Encounter (Signed)
June 25th, I will mail him order for crutches  Special equipment? Meniscal cinch, anything else ?

## 2019-02-22 NOTE — Telephone Encounter (Signed)
-----   Message from Carole Civil, MD sent at 02/22/2019 12:50 PM EDT ----- [place]

## 2019-02-22 NOTE — Telephone Encounter (Signed)
I have meniscal repair stuff in house now but u can add meniscus repair instrument call [employer] to the specials

## 2019-02-22 NOTE — Telephone Encounter (Signed)
CLINICAL DATA:  Chronic right knee pain. History of injury playing football and nail gun injury. Nail previously removed from the knee.   EXAM: MRI OF THE RIGHT KNEE WITHOUT CONTRAST IMPRESSION: 1. Large bucket-handle tear of the lateral meniscus. 2. The medial meniscus, cruciate and collateral ligaments are intact. 3. Mild lateral compartment degenerative changes with osteophyte formation. No acute osseous findings.     Electronically Signed   By: Richardean Sale M.D.   On: 02/17/2019 17:22   I CALLED HIM AND HE WANTS TO PROCEED WITH LATERAL MENISECTOMY RIGHT KNEE

## 2019-02-26 ENCOUNTER — Inpatient Hospital Stay (HOSPITAL_COMMUNITY)
Admission: RE | Admit: 2019-02-26 | Discharge: 2019-03-03 | DRG: 885 | Disposition: A | Discharge status: Home / Self Care | Payer: Medicaid Other | Attending: Psychiatry | Admitting: Psychiatry

## 2019-02-26 ENCOUNTER — Encounter (HOSPITAL_COMMUNITY): Payer: Self-pay | Admitting: Behavioral Health

## 2019-02-26 ENCOUNTER — Other Ambulatory Visit: Payer: Self-pay

## 2019-02-26 DIAGNOSIS — Z915 Personal history of self-harm: Secondary | ICD-10-CM

## 2019-02-26 DIAGNOSIS — I10 Essential (primary) hypertension: Secondary | ICD-10-CM | POA: Diagnosis present

## 2019-02-26 DIAGNOSIS — Z79890 Hormone replacement therapy (postmenopausal): Secondary | ICD-10-CM | POA: Diagnosis not present

## 2019-02-26 DIAGNOSIS — Z1159 Encounter for screening for other viral diseases: Secondary | ICD-10-CM

## 2019-02-26 DIAGNOSIS — K219 Gastro-esophageal reflux disease without esophagitis: Secondary | ICD-10-CM | POA: Diagnosis present

## 2019-02-26 DIAGNOSIS — F419 Anxiety disorder, unspecified: Secondary | ICD-10-CM | POA: Diagnosis present

## 2019-02-26 DIAGNOSIS — Z79899 Other long term (current) drug therapy: Secondary | ICD-10-CM | POA: Diagnosis not present

## 2019-02-26 DIAGNOSIS — F900 Attention-deficit hyperactivity disorder, predominantly inattentive type: Secondary | ICD-10-CM | POA: Diagnosis present

## 2019-02-26 DIAGNOSIS — F3130 Bipolar disorder, current episode depressed, mild or moderate severity, unspecified: Principal | ICD-10-CM | POA: Diagnosis present

## 2019-02-26 DIAGNOSIS — F3132 Bipolar disorder, current episode depressed, moderate: Secondary | ICD-10-CM | POA: Diagnosis not present

## 2019-02-26 DIAGNOSIS — G4700 Insomnia, unspecified: Secondary | ICD-10-CM | POA: Diagnosis present

## 2019-02-26 DIAGNOSIS — E039 Hypothyroidism, unspecified: Secondary | ICD-10-CM | POA: Diagnosis present

## 2019-02-26 DIAGNOSIS — Z885 Allergy status to narcotic agent status: Secondary | ICD-10-CM

## 2019-02-26 DIAGNOSIS — Z87891 Personal history of nicotine dependence: Secondary | ICD-10-CM

## 2019-02-26 DIAGNOSIS — R45851 Suicidal ideations: Secondary | ICD-10-CM | POA: Diagnosis present

## 2019-02-26 DIAGNOSIS — E7800 Pure hypercholesterolemia, unspecified: Secondary | ICD-10-CM | POA: Diagnosis present

## 2019-02-26 DIAGNOSIS — R569 Unspecified convulsions: Secondary | ICD-10-CM | POA: Diagnosis present

## 2019-02-26 DIAGNOSIS — F319 Bipolar disorder, unspecified: Secondary | ICD-10-CM | POA: Diagnosis present

## 2019-02-26 DIAGNOSIS — R45850 Homicidal ideations: Secondary | ICD-10-CM | POA: Diagnosis present

## 2019-02-26 DIAGNOSIS — Z8249 Family history of ischemic heart disease and other diseases of the circulatory system: Secondary | ICD-10-CM | POA: Diagnosis not present

## 2019-02-26 DIAGNOSIS — G8929 Other chronic pain: Secondary | ICD-10-CM | POA: Diagnosis present

## 2019-02-26 DIAGNOSIS — Z791 Long term (current) use of non-steroidal anti-inflammatories (NSAID): Secondary | ICD-10-CM

## 2019-02-26 DIAGNOSIS — G4730 Sleep apnea, unspecified: Secondary | ICD-10-CM | POA: Diagnosis present

## 2019-02-26 DIAGNOSIS — I2510 Atherosclerotic heart disease of native coronary artery without angina pectoris: Secondary | ICD-10-CM | POA: Diagnosis present

## 2019-02-26 DIAGNOSIS — K589 Irritable bowel syndrome without diarrhea: Secondary | ICD-10-CM | POA: Diagnosis present

## 2019-02-26 DIAGNOSIS — Z800 Family history of malignant neoplasm of digestive organs: Secondary | ICD-10-CM

## 2019-02-26 DIAGNOSIS — F603 Borderline personality disorder: Secondary | ICD-10-CM | POA: Diagnosis present

## 2019-02-26 LAB — CBC
HCT: 43.2 % (ref 39.0–52.0)
Hemoglobin: 15.4 g/dL (ref 13.0–17.0)
MCH: 33.8 pg (ref 26.0–34.0)
MCHC: 35.6 g/dL (ref 30.0–36.0)
MCV: 94.7 fL (ref 80.0–100.0)
Platelets: 256 10*3/uL (ref 150–400)
RBC: 4.56 MIL/uL (ref 4.22–5.81)
RDW: 13.3 % (ref 11.5–15.5)
WBC: 7.9 10*3/uL (ref 4.0–10.5)
nRBC: 0 % (ref 0.0–0.2)

## 2019-02-26 LAB — LIPID PANEL
Cholesterol: 188 mg/dL (ref 0–200)
HDL: 33 mg/dL — ABNORMAL LOW (ref >40)
LDL Cholesterol: 100 mg/dL — ABNORMAL HIGH (ref 0–99)
Total CHOL/HDL Ratio: 5.7 RATIO
Triglycerides: 273 mg/dL — ABNORMAL HIGH (ref <150)
VLDL: 55 mg/dL — ABNORMAL HIGH (ref 0–40)

## 2019-02-26 LAB — COMPREHENSIVE METABOLIC PANEL
ALT: 30 U/L (ref 0–44)
AST: 27 U/L (ref 15–41)
Albumin: 4.5 g/dL (ref 3.5–5.0)
Alkaline Phosphatase: 66 U/L (ref 38–126)
Anion gap: 10 (ref 5–15)
BUN: 10 mg/dL (ref 6–20)
CO2: 23 mmol/L (ref 22–32)
Calcium: 8.9 mg/dL (ref 8.9–10.3)
Chloride: 106 mmol/L (ref 98–111)
Creatinine, Ser: 0.88 mg/dL (ref 0.61–1.24)
GFR calc Af Amer: >60 mL/min (ref >60)
GFR calc non Af Amer: >60 mL/min (ref >60)
Glucose, Bld: 127 mg/dL — ABNORMAL HIGH (ref 70–99)
Potassium: 3.3 mmol/L — ABNORMAL LOW (ref 3.5–5.1)
Sodium: 139 mmol/L (ref 135–145)
Total Bilirubin: 0.1 mg/dL — ABNORMAL LOW (ref 0.3–1.2)
Total Protein: 7.2 g/dL (ref 6.5–8.1)

## 2019-02-26 LAB — LITHIUM LEVEL: Lithium Lvl: 0.13 mmol/L — ABNORMAL LOW (ref 0.60–1.20)

## 2019-02-26 LAB — URINALYSIS, ROUTINE W REFLEX MICROSCOPIC
Bilirubin Urine: NEGATIVE
Glucose, UA: NEGATIVE mg/dL
Hgb urine dipstick: NEGATIVE
Ketones, ur: NEGATIVE mg/dL
Leukocytes,Ua: NEGATIVE
Nitrite: NEGATIVE
Protein, ur: NEGATIVE mg/dL
Specific Gravity, Urine: 1.021 (ref 1.005–1.030)
pH: 6 (ref 5.0–8.0)

## 2019-02-26 LAB — RAPID URINE DRUG SCREEN, HOSP PERFORMED
Amphetamines: NOT DETECTED
Barbiturates: NOT DETECTED
Benzodiazepines: NOT DETECTED
Cocaine: NOT DETECTED
Opiates: NOT DETECTED
Tetrahydrocannabinol: POSITIVE — AB

## 2019-02-26 LAB — TSH: TSH: 1.062 u[IU]/mL (ref 0.350–4.500)

## 2019-02-26 LAB — SARS CORONAVIRUS 2 BY RT PCR (HOSPITAL ORDER, PERFORMED IN ~~LOC~~ HOSPITAL LAB): SARS Coronavirus 2: NEGATIVE

## 2019-02-26 MED ORDER — HYDROXYZINE HCL 25 MG PO TABS: 25.0000 mg | ORAL_TABLET | Freq: Three times a day (TID) | ORAL | Status: DC | PRN

## 2019-02-26 MED ORDER — ALPRAZOLAM 0.5 MG PO TABS
0.5000 mg | ORAL_TABLET | Freq: Two times a day (BID) | ORAL | Status: DC
  Administered 2019-02-26 – 2019-02-28 (×5): 0.5 mg via ORAL
  Filled 2019-02-26 (×5): qty 1

## 2019-02-26 MED ORDER — QUETIAPINE FUMARATE 400 MG PO TABS
400.0000 mg | ORAL_TABLET | Freq: Every day | ORAL | Status: DC
  Administered 2019-02-26 – 2019-02-27 (×2): 400 mg via ORAL
  Filled 2019-02-26 (×4): qty 1
  Filled 2019-02-26: qty 2

## 2019-02-26 MED ORDER — OXCARBAZEPINE 150 MG PO TABS
150.0000 mg | ORAL_TABLET | Freq: Two times a day (BID) | ORAL | Status: DC
  Administered 2019-02-26 – 2019-03-03 (×10): 150 mg via ORAL
  Filled 2019-02-26 (×13): qty 1

## 2019-02-26 MED ORDER — LITHIUM CARBONATE 300 MG PO CAPS
300.0000 mg | ORAL_CAPSULE | Freq: Every day | ORAL | Status: DC
  Administered 2019-02-27 – 2019-03-03 (×5): 300 mg via ORAL
  Filled 2019-02-26 (×8): qty 1

## 2019-02-26 MED ORDER — MAGNESIUM HYDROXIDE 400 MG/5ML PO SUSP: 30.0000 mL | Freq: Every day | ORAL | Status: DC | PRN

## 2019-02-26 MED ORDER — CLONIDINE HCL 0.1 MG PO TABS
0.1000 mg | ORAL_TABLET | Freq: Three times a day (TID) | ORAL | Status: DC
  Administered 2019-02-27 – 2019-03-03 (×13): 0.1 mg via ORAL
  Filled 2019-02-26 (×17): qty 1

## 2019-02-26 MED ORDER — SIMVASTATIN 10 MG PO TABS
10.0000 mg | ORAL_TABLET | Freq: Every day | ORAL | Status: DC
  Administered 2019-02-27 – 2019-03-02 (×4): 10 mg via ORAL
  Filled 2019-02-26 (×6): qty 1

## 2019-02-26 MED ORDER — HYDROXYZINE HCL 25 MG PO TABS
25.0000 mg | ORAL_TABLET | Freq: Four times a day (QID) | ORAL | Status: DC | PRN
  Administered 2019-02-27 – 2019-03-03 (×5): 25 mg via ORAL
  Filled 2019-02-26 (×4): qty 1

## 2019-02-26 MED ORDER — TRAZODONE HCL 50 MG PO TABS: 50.0000 mg | ORAL_TABLET | Freq: Every evening | ORAL | Status: DC | PRN

## 2019-02-26 MED ORDER — LINACLOTIDE 290 MCG PO CAPS
290.0000 ug | ORAL_CAPSULE | Freq: Every day | ORAL | Status: DC
  Administered 2019-02-27 – 2019-03-03 (×5): 290 ug via ORAL
  Filled 2019-02-26 (×6): qty 1

## 2019-02-26 MED ORDER — METOPROLOL TARTRATE 50 MG PO TABS
50.0000 mg | ORAL_TABLET | Freq: Two times a day (BID) | ORAL | Status: DC
  Administered 2019-02-26 – 2019-03-03 (×10): 50 mg via ORAL
  Filled 2019-02-26 (×13): qty 1

## 2019-02-26 MED ORDER — GABAPENTIN 300 MG PO CAPS
600.0000 mg | ORAL_CAPSULE | Freq: Three times a day (TID) | ORAL | Status: DC
  Administered 2019-02-26 – 2019-03-03 (×14): 600 mg via ORAL
  Filled 2019-02-26 (×18): qty 2

## 2019-02-26 MED ORDER — ALUM & MAG HYDROXIDE-SIMETH 200-200-20 MG/5ML PO SUSP: 30.0000 mL | ORAL | Status: DC | PRN

## 2019-02-26 MED ORDER — TRAZODONE HCL 100 MG PO TABS
100.0000 mg | ORAL_TABLET | Freq: Every evening | ORAL | Status: DC | PRN
  Administered 2019-02-26 – 2019-02-27 (×2): 100 mg via ORAL
  Filled 2019-02-26 (×2): qty 1

## 2019-02-26 MED ORDER — PANTOPRAZOLE SODIUM 40 MG PO TBEC
40.0000 mg | DELAYED_RELEASE_TABLET | Freq: Every day | ORAL | Status: DC
  Administered 2019-02-27 – 2019-03-03 (×5): 40 mg via ORAL
  Filled 2019-02-26 (×6): qty 1

## 2019-02-26 MED ORDER — VENLAFAXINE HCL ER 75 MG PO CP24
75.0000 mg | ORAL_CAPSULE | Freq: Every day | ORAL | Status: DC
  Administered 2019-02-27 – 2019-03-03 (×5): 75 mg via ORAL
  Filled 2019-02-26 (×6): qty 1

## 2019-02-26 MED ORDER — LITHIUM CARBONATE 300 MG PO CAPS
600.0000 mg | ORAL_CAPSULE | Freq: Every day | ORAL | Status: DC
  Administered 2019-02-26 – 2019-03-02 (×5): 600 mg via ORAL
  Filled 2019-02-26 (×7): qty 2

## 2019-02-26 MED ORDER — ACETAMINOPHEN 325 MG PO TABS
650.0000 mg | ORAL_TABLET | Freq: Four times a day (QID) | ORAL | Status: DC | PRN
  Administered 2019-02-26 – 2019-03-01 (×3): 650 mg via ORAL
  Filled 2019-02-26 (×4): qty 2

## 2019-02-26 MED ORDER — CLONIDINE HCL 0.1 MG PO TABS
0.1000 mg | ORAL_TABLET | Freq: Once | ORAL | Status: AC
  Administered 2019-02-26: 0.1 mg via ORAL
  Filled 2019-02-26 (×2): qty 1

## 2019-02-26 MED ORDER — LEVOTHYROXINE SODIUM 50 MCG PO TABS
50.0000 ug | ORAL_TABLET | Freq: Every day | ORAL | Status: DC
Start: 2019-02-27 — End: 2019-03-03
  Administered 2019-02-27 – 2019-03-03 (×5): 50 ug via ORAL
  Filled 2019-02-26 (×4): qty 1
  Filled 2019-02-26: qty 2
  Filled 2019-02-26 (×2): qty 1
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
## 2019-02-26 NOTE — Tx Team (Signed)
Initial Treatment Plan 02/26/2019 3:10 PM [name] [id number]    PATIENT STRESSORS: Health problems Medication change or noncompliance   PATIENT STRENGTHS: Ability for insight Motivation for treatment/growth   PATIENT IDENTIFIED PROBLEMS: "Mood"  "Agitation"                   DISCHARGE CRITERIA:  Ability to meet basic life and health needs Adequate post-discharge living arrangements Improved stabilization in mood, thinking, and/or behavior  PRELIMINARY DISCHARGE PLAN: Attend aftercare/continuing care group Attend PHP/IOP  PATIENT/FAMILY INVOLVEMENT: This treatment plan has been presented to and reviewed with the patient, Jermaine [name], and/or family member.  The patient and family have been given the opportunity to ask questions and make suggestions.  Marissa Calamity, RN 02/26/2019, 3:10 PM

## 2019-02-26 NOTE — Progress Notes (Signed)
Adult Psychoeducational Group Note  Date:  02/26/2019 Time:  9:24 PM  Group Topic/Focus:  Wrap-Up Group:   The focus of this group is to help patients review their daily goal of treatment and discuss progress on daily workbooks.  Participation Level:  Minimal  Participation Quality:  Appropriate  Affect:  Flat  Cognitive:  Appropriate  Insight: Appropriate  Engagement in Group:  Lacking  Modes of Intervention:  Discussion  Additional Comments:  Patient attended group although he said he had just arrived. Patient said his day was a 2.   Jermaine [name] [name] 12/21/4434, 9:24 PM
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
## 2019-02-26 NOTE — H&P (Signed)
Behavioral Health Medical Screening Exam  Jermaine [name] is an 38 y.o. male. Pt presented to [facility] as a walk-in. He has not slept much in 4-5 days and his appetite is way off. He has a history of Bipolar Disorder, ADHD, and Anxiety. He goes to [facility] for medication management. He stated that he has been feeling like his medications are not working for about 1 month. He called [facility] and they told him to just keep taking them until he can come in to be seen. He stated that because of COVID, they are not seeing people in person right now. He appears distraught, depressed, and confused about what he needs to have done. He keeps saying, " I just need help."  Chart review shows he was admitted at [facility] from 12-5 to 12-13, 2019. He does have a history of nonepileptic seizures.   Total Time spent with patient: 30 minutes  Psychiatric Specialty Exam: Physical Exam  ROS  There were no vitals taken for this visit.There is no height or weight on file to calculate BMI.  General Appearance: Casual  Eye Contact:  Fair  Speech:  Clear and Coherent  Volume:  Decreased  Mood:  Anxious and Depressed  Affect:  Congruent and Depressed  Thought Process:  Coherent and Descriptions of Associations: Intact  Orientation:  Full (Time, Place, and Person)  Thought Content:  Logical  Suicidal Thoughts:  No  Homicidal Thoughts:  Yes.  with intent/plan  Memory:  Immediate;   Good Recent;   Fair Remote;   Fair  Judgement:  Fair  Insight:  Fair  Psychomotor Activity:  Normal  Concentration: Concentration: Fair and Attention Span: Fair  Recall:  [employer] of Knowledge:Good  Language: Good  Akathisia:  Negative  Handed:  Right  AIMS (if indicated):     Assets:  Agricultural consultant Housing Social Support  Sleep:       Musculoskeletal: Strength & Muscle Tone: within normal limits Gait & Station: not tested Patient leans: N/A  There were no vitals taken for this  visit.  Recommendations:  Based on my evaluation the patient does not appear to have an emergency medical condition.  [name], NP 02/26/2019, 1:20 PM

## 2019-02-27 DIAGNOSIS — F603 Borderline personality disorder: Secondary | ICD-10-CM

## 2019-02-27 LAB — HEMOGLOBIN A1C
Hgb A1c MFr Bld: 5.1 % (ref 4.8–5.6)
Mean Plasma Glucose: 99.67 mg/dL

## 2019-02-27 MED ORDER — NICOTINE POLACRILEX 2 MG MT GUM
2.0000 mg | CHEWING_GUM | OROMUCOSAL | Status: DC | PRN
  Administered 2019-02-28 – 2019-03-03 (×11): 2 mg via ORAL
  Filled 2019-02-27: qty 1

## 2019-02-27 MED ORDER — OXYCODONE-ACETAMINOPHEN 5-325 MG PO TABS
2.0000 | ORAL_TABLET | Freq: Three times a day (TID) | ORAL | Status: DC | PRN
  Administered 2019-02-27 – 2019-02-28 (×4): 2 via ORAL
  Filled 2019-02-27 (×4): qty 2

## 2019-02-27 MED ORDER — AMLODIPINE BESYLATE 5 MG PO TABS
5.0000 mg | ORAL_TABLET | Freq: Every day | ORAL | Status: DC
  Administered 2019-02-27 – 2019-03-03 (×5): 5 mg via ORAL
  Filled 2019-02-27 (×7): qty 1

## 2019-02-27 NOTE — Plan of Care (Signed)
D: Patient is up and interacting in the milieu, he seems happy to be here. Patient is alert, oriented, needy, and cooperative. Endorses HI toward a neighbor. Denies SI, AVH, and verbally contracts for safety. For day shift he endorsed all. Patient talks about his pain medication and reported upcoming surgeries. He requests pain medication when he knows it is available. He makes several requests; after one need has been met he asks for something else (Gatorade, Band-Aid, medication information, socks).   A: Medications administered per MD order. Support provided. Patient educated on safety on the unit and medications. Routine safety checks every 15 minutes. Patient stated understanding to tell nurse about any new physical symptoms. Patient understands to tell staff of any needs.     R: No adverse drug reactions noted. Patient verbally contracts for safety. Patient remains safe at this time and will continue to monitor.   Problem: Activity: Goal: Interest or engagement in activities will improve Outcome: Progressing   Problem: Safety: Goal: Periods of time without injury will increase Outcome: Progressing   Patient is up and interacting in the milieu. Patient remains safe and will continue to monitor.   [place] NOVEL CORONAVIRUS (COVID-19) DAILY CHECK-OFF SYMPTOMS - answer yes or no to each - every day NO YES  Have you had a fever in the past 24 hours?  Fever (Temp > 37.80C / 100F) X   Have you had any of these symptoms in the past 24 hours? New Cough  Sore Throat   Shortness of Breath  Difficulty Breathing  Unexplained Body Aches   X   Have you had any one of these symptoms in the past 24 hours not related to allergies?   Runny Nose  Nasal Congestion  Sneezing   X   If you have had runny nose, nasal congestion, sneezing in the past 24 hours, has it worsened?  X   EXPOSURES - check yes or no X   Have you traveled outside the state in the past 14 days?  X   Have you been in  contact with someone with a confirmed diagnosis of COVID-19 or PUI in the past 14 days without wearing appropriate PPE?  X   Have you been living in the same home as a person with confirmed diagnosis of COVID-19 or a PUI (household contact)?    X   Have you been diagnosed with COVID-19?    X              What to do next: Answered NO to all: Answered YES to anything:   Proceed with unit schedule Follow the [facility] Inpatient Flowsheet.
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
## 2019-02-27 NOTE — Tx Team (Signed)
Interdisciplinary Treatment and Diagnostic Plan Update  02/27/2019 Time of Session: Jermaine [name] MRN: [id number]  Principal Diagnosis: Bipolar disorder current episode depressed [facility])  Secondary Diagnoses: Principal Problem:   Bipolar disorder current episode depressed ([place]) Active Problems:   Borderline personality disorder ([place])   Bipolar 1 disorder, depressed ([place])   Current Medications:  Current Facility-Administered Medications  Medication Dose Route Frequency Provider Last Rate Last Dose  . acetaminophen (TYLENOL) tablet 650 mg  650 mg Oral Q6H PRN [name], NP   650 mg at 02/27/19 0324  . ALPRAZolam Duanne Moron) tablet 0.5 mg  0.5 mg Oral BID [name], NP   0.5 mg at 02/27/19 3009  . alum & mag hydroxide-simeth (MAALOX/MYLANTA) 200-200-20 MG/5ML suspension 30 mL  30 mL Oral Q4H PRN [name], NP      . cloNIDine (CATAPRES) tablet 0.1 mg  0.1 mg Oral TID [name], NP   0.1 mg at 02/27/19 2330  . gabapentin (NEURONTIN) capsule 600 mg  600 mg Oral TID [name], NP   600 mg at 02/27/19 0762  . hydrOXYzine (ATARAX/VISTARIL) tablet 25 mg  25 mg Oral Q6H PRN [name], NP   25 mg at 02/27/19 0324  . levothyroxine (SYNTHROID) tablet 50 mcg  50 mcg Oral QAC breakfast [name], NP   50 mcg at 02/27/19 [phone]  . linaclotide (LINZESS) capsule 290 mcg  290 mcg Oral Daily [name], NP   290 mcg at 02/27/19 3545  . lithium carbonate capsule 300 mg  300 mg Oral Q breakfast [name], NP   300 mg at 02/27/19 0836  . lithium carbonate capsule 600 mg  600 mg Oral QHS Hampton Abbot, MD   600 mg at 02/26/19 2121  . magnesium hydroxide (MILK OF MAGNESIA) suspension 30 mL  30 mL Oral Daily PRN [name], NP      . metoprolol tartrate (LOPRESSOR) tablet 50 mg  50 mg Oral BID [name], NP   50 mg at 02/27/19 0834  . OXcarbazepine (TRILEPTAL) tablet 150 mg  150 mg Oral BID  [name], NP   150 mg at 02/27/19 0835  . pantoprazole (PROTONIX) EC tablet 40 mg  40 mg Oral Daily [name], NP   40 mg at 02/27/19 0835  . QUEtiapine (SEROQUEL) tablet 400 mg  400 mg Oral QHS [name], NP   400 mg at 02/26/19 2120  . simvastatin (ZOCOR) tablet 10 mg  10 mg Oral q1800 [name], NP      . traZODone (DESYREL) tablet 100 mg  100 mg Oral QHS PRN [name], NP   100 mg at 02/26/19 2120  . venlafaxine XR (EFFEXOR-XR) 24 hr capsule 75 mg  75 mg Oral Q breakfast [name], NP   75 mg at 02/27/19 6256   PTA Medications: Medications Prior to Admission  Medication Sig Dispense Refill Last Dose  . ALPRAZolam (XANAX) 0.5 MG tablet Take 0.5 mg by mouth 2 (two) times a day.     . oxyCODONE-acetaminophen (PERCOCET) 10-325 MG tablet Take 1 tablet by mouth 2 (two) times a day.     . cloNIDine (CATAPRES) 0.1 MG tablet Take 1 tablet (0.1 mg total) by mouth 3 (three) times daily. For high blood pressure 9 tablet 0 Taking  . cyclobenzaprine (FLEXERIL) 10 MG tablet Take 10 mg by mouth 3 (three) times daily.   Taking  .  desvenlafaxine (PRISTIQ) 50 MG 24 hr tablet Take 1 tablet (50 mg total) by mouth daily. For depression 30 tablet 0 Taking  . gabapentin (NEURONTIN) 300 MG capsule Take 2 capsules (600 mg total) by mouth 3 (three) times daily. For agitation 180 capsule 0 Taking  . hydrOXYzine (ATARAX/VISTARIL) 25 MG tablet Take 1 tablet (25 mg total) by mouth every 6 (six) hours as needed for anxiety. 60 tablet 0 Taking  . levothyroxine (SYNTHROID, LEVOTHROID) 50 MCG tablet Take 50 mcg by mouth daily before breakfast.   Taking  . linaclotide (LINZESS) 290 MCG CAPS capsule Take 1 capsule (290 mcg total) by mouth daily. For constipation 1 capsule 0 Taking  . lithium carbonate 300 MG capsule Take 1 tablet (300 mg) by mouth in the morning and 2 tablets (600 mg) at bedtime: mood stabilization (Patient taking differently: Take 300-600  mg by mouth See admin instructions. Take 1 tablet (300 mg) by mouth in the morning and 2 tablets (600 mg) at bedtime: mood stabilization) 90 capsule 0 Taking  . meloxicam (MOBIC) 15 MG tablet Take 15 mg by mouth daily.   Taking  . metoprolol tartrate (LOPRESSOR) 50 MG tablet Take 1 tablet (50 mg total) by mouth 2 (two) times daily. 30 tablet 0   . omeprazole (PRILOSEC) 20 MG capsule Take 1 capsule (20 mg total) by mouth daily. For acid reflux 5 capsule 0 Taking  . OXcarbazepine (TRILEPTAL) 150 MG tablet Take 1 tablet (150 mg total) by mouth 2 (two) times daily. 10 tablet 0   . QUEtiapine (SEROQUEL) 400 MG tablet Take 1 tablet (400 mg total) by mouth at bedtime. For mood control 30 tablet 0 Taking  . simvastatin (ZOCOR) 10 MG tablet Take 1 tablet (10 mg total) by mouth daily at 6 PM. For high cholesterol (Patient taking differently: Take 20 mg by mouth daily at 6 PM. For high cholesterol) 5 tablet 0 Taking  . traZODone (DESYREL) 100 MG tablet Take 100-200 mg by mouth at bedtime.   Taking    Patient Stressors: Health problems Medication change or noncompliance  Patient Strengths: Ability for insight Motivation for treatment/growth  Treatment Modalities: Medication Management, Group therapy, Case management,  1 to 1 session with clinician, Psychoeducation, Recreational therapy.   Physician Treatment Plan for Primary Diagnosis: Bipolar disorder current episode depressed ([place]) Long Term Goal(s):     Short Term Goals:    Medication Management: Evaluate patient's response, side effects, and tolerance of medication regimen.  Therapeutic Interventions: 1 to 1 sessions, Unit Group sessions and Medication administration.  Evaluation of Outcomes: Not Met  Physician Treatment Plan for Secondary Diagnosis: Principal Problem:   Bipolar disorder current episode depressed ([place]) Active Problems:   Borderline personality disorder ([place])   Bipolar 1 disorder, depressed ([place])  Long Term Goal(s):      Short Term Goals:       Medication Management: Evaluate patient's response, side effects, and tolerance of medication regimen.  Therapeutic Interventions: 1 to 1 sessions, Unit Group sessions and Medication administration.  Evaluation of Outcomes: Not Met   RN Treatment Plan for Primary Diagnosis: Bipolar disorder current episode depressed ([place]) Long Term Goal(s): Knowledge of disease and therapeutic regimen to maintain health will improve  Short Term Goals: Ability to verbalize frustration and anger appropriately will improve, Ability to participate in decision making will improve, Ability to verbalize feelings will improve, Ability to disclose and discuss suicidal ideas and Ability to identify and develop effective coping behaviors will improve  Medication Management: RN  will administer medications as ordered by provider, will assess and evaluate patient's response and provide education to patient for prescribed medication. RN will report any adverse and/or side effects to prescribing provider.  Therapeutic Interventions: 1 on 1 counseling sessions, Psychoeducation, Medication administration, Evaluate responses to treatment, Monitor vital signs and CBGs as ordered, Perform/monitor CIWA, COWS, AIMS and Fall Risk screenings as ordered, Perform wound care treatments as ordered.  Evaluation of Outcomes: Not Met   LCSW Treatment Plan for Primary Diagnosis: Bipolar disorder current episode depressed ([place]) Long Term Goal(s): Safe transition to appropriate next level of care at discharge, Engage patient in therapeutic group addressing interpersonal concerns.  Short Term Goals: Engage patient in aftercare planning with referrals and resources  Therapeutic Interventions: Assess for all discharge needs, 1 to 1 time with Social worker, Explore available resources and support systems, Assess for adequacy in community support network, Educate family and significant other(s) on suicide prevention,  Complete Psychosocial Assessment, Interpersonal group therapy.  Evaluation of Outcomes: Not Met   Progress in Treatment: Attending groups: No. New to unit Participating in groups: No. Taking medication as prescribed: Yes. Toleration medication: Yes. Family/Significant other contact made: No, will contact:  the patient's wife Patient understands diagnosis: Yes. Discussing patient identified problems/goals with staff: Yes. Medical problems stabilized or resolved: Yes. Denies suicidal/homicidal ideation: Yes. Issues/concerns per patient self-inventory: No. Other:   New problem(s) identified: None   New Short Term/Long Term Goal(s):medication stabilization, elimination of SI thoughts, development of comprehensive mental wellness plan.    Patient Goals:    Discharge Plan or Barriers: Patient plans to return home with his spouse and children. He will continue to follow up with [facility] for outpatient medication management and therapy services. CSW will continue to follow for any additional referrals and possible discharge planning.   Reason for Continuation of Hospitalization: Aggression Anxiety Depression Medication stabilization  Estimated Length of Stay: 3-5 days   Attendees: Patient: 02/27/2019 11:03 AM  Physician: Dr. Myles Lipps, MD 02/27/2019 11:03 AM  Nursing: Rise Paganini.Raliegh Ip, RN 02/27/2019 11:03 AM  RN Care Manager: 02/27/2019 11:03 AM  Social Worker: Radonna Ricker, [place] 02/27/2019 11:03 AM  Recreational Therapist:  02/27/2019 11:03 AM  Other:  02/27/2019 11:03 AM  Other:  02/27/2019 11:03 AM  Other: 02/27/2019 11:03 AM    Scribe for Treatment Team: [name], [place] 02/27/2019 11:03 AM

## 2019-02-27 NOTE — Plan of Care (Signed)
D: Patient is alert, oriented, and cooperative. Endorses passive SI with "thoughts of a bunch of different ways I could do it". Denies HI, AVH, and verbally contracts for safety. Patient reports pain and two upcoming surgeries this month (R knee and scrotum).    A: Medications administered per MD order. Support provided. Patient educated on safety on the unit and medications. Routine safety checks every 15 minutes. Patient stated understanding to tell nurse about any new physical symptoms. Patient understands to tell staff of any needs.     R: No adverse drug reactions noted. Patient verbally contracts for safety. Patient remains safe at this time and will continue to monitor.   Problem: Education: Goal: Knowledge of [place] [facility] Education information/materials will improve Outcome: Progressing   Problem: Safety: Goal: Periods of time without injury will increase Outcome: Progressing   Patient oriented to the unit. Patient remains safe and will continue to monitor.   [place] NOVEL CORONAVIRUS (COVID-19) DAILY CHECK-OFF SYMPTOMS - answer yes or no to each - every day NO YES  Have you had a fever in the past 24 hours?  Fever (Temp > 37.80C / 100F) X   Have you had any of these symptoms in the past 24 hours? New Cough  Sore Throat   Shortness of Breath  Difficulty Breathing  Unexplained Body Aches   X   Have you had any one of these symptoms in the past 24 hours not related to allergies?   Runny Nose  Nasal Congestion  Sneezing   X   If you have had runny nose, nasal congestion, sneezing in the past 24 hours, has it worsened?  X   EXPOSURES - check yes or no X   Have you traveled outside the state in the past 14 days?  X   Have you been in contact with someone with a confirmed diagnosis of COVID-19 or PUI in the past 14 days without wearing appropriate PPE?  X   Have you been living in the same home as a person with confirmed diagnosis of COVID-19 or a PUI (household  contact)?    X   Have you been diagnosed with COVID-19?    X              What to do next: Answered NO to all: Answered YES to anything:   Proceed with unit schedule Follow the [facility] Inpatient Flowsheet.

## 2019-02-27 NOTE — Progress Notes (Signed)
Adult Psychoeducational Group Note  Date:  02/27/2019 Time:  10:39 AM  Group Topic/Focus:  Developing a Wellness Toolbox:   The focus of this group is to help patients develop a "wellness toolbox" with skills and strategies to promote recovery upon discharge.  Participation Level:  Minimal  Participation Quality:  Appropriate  Affect:  Flat  Cognitive:  Appropriate  Insight: Appropriate  Engagement in Group:  Lacking  Modes of Intervention:  Discussion and Education  Additional Comments:  Pt was only concern about getting pain medications from the MD. Pt said the NP only gave him Tylenol and it does not work.  Deshana Rominger E 02/27/2019, 10:39 AM

## 2019-02-27 NOTE — [facility] Suicide Risk Assessment (Signed)
[facility] Admission Suicide Risk Assessment   Nursing information obtained from:  Patient Demographic factors:  Male, Jermaine [name], lesbian, or bisexual orientation, Low socioeconomic status, Unemployed Current Mental Status:  Suicidal ideation indicated by patient, Self-harm thoughts Loss Factors:  Decline in physical health Historical Factors:  Anniversary of important loss, Prior suicide attempts Risk Reduction Factors:  Responsible for children under 103 years of age, Positive social support, Sense of responsibility to family  Total Time spent with patient: 30 minutes Principal Problem: Bipolar disorder current episode depressed ([place]) Diagnosis:  Principal Problem:   Bipolar disorder current episode depressed ([place]) Active Problems:   Borderline personality disorder ([place])   Bipolar 1 disorder, depressed ([place])  Subjective Data: Patient is seen and examined.  Patient is a 38 year old male with a past psychiatric history significant for bipolar disorder versus intermittent explosive disorder versus impulse control disorder, borderline personality disorder, nonepileptic seizures, chronic pain who presented as a walk-in appointment to the behavioral health hospital on 02/26/2019.  He was dropped off by his wife.  Patient stated that most recently he had become homicidal because the fact that a next-door neighbor was texting his wife that she was "fat and ugly".  This upset him greatly.  He became greatly angered by this, and threatened to kill his neighbor.  He stated that "I am suffering a manic episode".  His last psychiatric hospitalization at our facility was in December 2019, and then was also admitted later that month to the [facility] in [place].  He has an extensive history of nonepileptic seizures, and stated that he was repairing his roof the other day for a leak, had a seizure while on the roof, then fell and harmed himself in a physical injury.  He stated that [facility] is where  he is seen as an outpatient, and they had recently attempted to get him placed on Remeron.  This was not approved by his insurance, and he has been having a great deal of sleeping problems since he is not had the Seroquel which she had taken in the past.  He stated he had been compliant with his lithium.  He has a history of opioid dependence and was on buprenorphine until August or September 2019 where he was placed on narcotic pain medications.  He also was prescribed Xanax 0.5 mg 1 tablet p.o. twice daily by Dr. Allyn Kenner in [place].  His last prescription was written on 02/14/2019.  There were no other benzodiazepine prescriptions written within the last 6 months in the database.  His lithium level on admission was only 0.13.  His drug screen was only positive for marijuana.  There were no benzodiazepines or opiates in his drug screen on admission.  He was admitted to the hospital for evaluation and stabilization.  Continued Clinical Symptoms:  Alcohol Use Disorder Identification Test Final Score (AUDIT): 0 The "Alcohol Use Disorders Identification Test", Guidelines for Use in Primary Care, Second Edition.  World Pharmacologist [facility]). Score between 0-7:  no or low risk or alcohol related problems. Score between 8-15:  moderate risk of alcohol related problems. Score between 16-19:  high risk of alcohol related problems. Score 20 or above:  warrants further diagnostic evaluation for alcohol dependence and treatment.   CLINICAL FACTORS:   Bipolar Disorder:   Bipolar II Depression:   Anhedonia Impulsivity Insomnia Alcohol/Substance Abuse/Dependencies Personality Disorders:   Cluster B Chronic Pain More than one psychiatric diagnosis Previous Psychiatric Diagnoses and Treatments Medical Diagnoses and Treatments/Surgeries  Musculoskeletal: Strength & Muscle Tone: within normal limits Gait & Station: normal Patient leans: N/A  Psychiatric Specialty Exam: Physical Exam  Nursing  note and vitals reviewed. Constitutional: He is oriented to person, place, and time. He appears well-developed.  HENT:  Head: Normocephalic and atraumatic.  Respiratory: Effort normal.  Neurological: He is alert and oriented to person, place, and time.    ROS  Blood pressure (!) 150/117, pulse 93, temperature 98 F (36.7 C), temperature source Oral, resp. rate 18, height 6\' 1"  (1.854 m), weight 95.3 kg, SpO2 100 %.Body mass index is 27.71 kg/m.  General Appearance: Casual  Eye Contact:  Good  Speech:  Normal Rate  Volume:  Normal  Mood:  Anxious and Irritable  Affect:  Congruent  Thought Process:  Coherent and Descriptions of Associations: Intact  Orientation:  Full (Time, Place, and Person)  Thought Content:  Logical  Suicidal Thoughts:  No  Homicidal Thoughts:  Yes.  without intent/plan  Memory:  Immediate;   Fair Recent;   Fair Remote;   Fair  Judgement:  Impaired  Insight:  Lacking  Psychomotor Activity:  Increased  Concentration:  Concentration: Fair and Attention Span: Fair  Recall:  [employer] of Knowledge:  Fair  Language:  Fair  Akathisia:  Negative  Handed:  Right  AIMS (if indicated):     Assets:  Desire for Improvement Resilience  ADL's:  Intact  Cognition:  WNL  Sleep:  Number of Hours: 6      COGNITIVE FEATURES THAT CONTRIBUTE TO RISK:  Thought constriction (tunnel vision)    SUICIDE RISK:   Minimal: No identifiable suicidal ideation.  Patients presenting with no risk factors but with morbid ruminations; may be classified as minimal risk based on the severity of the depressive symptoms  PLAN OF CARE: Patient is seen and examined.  Patient is a 38 year old male with the above-stated past psychiatric history who was admitted to the hospital for evaluation and stabilization secondary to impulse control problems, homicidal ideation, and irritability.  He also stated that he had not slept in several days.  He will be admitted to the hospital.  He will be  integrated into the milieu.  He will be encouraged to attend groups.  He has already asked.  His pain medication as well as the alprazolam.  Review of the electronic medical record database showed that he received an oxycodone/acetaminophen prescription for 10/3 2528 tablets for 14 days.  He also received a prescription on 5/26 for alprazolam 0.5 mg 1 tablet p.o. twice daily for 20 days.  His drug screen on admission did not have benzodiazepines or opiates in its results.  His lithium level was only 0.16, so I suspect there is been some noncompliance with this additionally.  When he was discharged from [facility] his lithium dosage was 300 mg p.o. every morning and 600 mg p.o. nightly.  The Depakote was stopped as well as the Lamictal, BuSpar, hydroxyzine and clonidine.  It was noted in the chart that he was not taking these medications.  He is taking his levothyroxine 50 mcg p.o. daily and his TSH is normal on admission.  I will restart his amlodipine for hypertension and continue the clonidine for now.  We will attempt to contact his outpatient doctor about his medications as well as his wife with suspicion noncompliance.  Hopefully we can get him stabilized.  He is requesting discharge prior to next Monday when he is scheduled for an outpatient urological procedure.  I certify  that inpatient services furnished can reasonably be expected to improve the patient's condition.   Sharma Covert, MD 02/27/2019, 11:39 AM
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
## 2019-02-27 NOTE — Plan of Care (Signed)
Nurse discussed anxiety, depression, coping skills with patient. 

## 2019-02-27 NOTE — Progress Notes (Signed)
D:  Patient's self inventory sheet, patient has poor sleep, sleep medication not helpful.  Fair appetite, low energy level, poor concentration.  Rated depression, anxiety, hopeless 8.  Denied withdrawals.  SI off/on, contracts for safety.  Physical problems, pain, R knee, back, worst pain #7 in past 24 hours.  Pain medicine not helping.  Goal is get meds stabilized.  Plans to see MD.  No discharge plans. A:  Medications administered per MD orders.  Emotional support and encouragement given patient. R:  SI off/on, contracts for safety, no plan.  Does have HI to someone, that person does not work at [facility].  Does see shadows, ghosts (I don't know.}  Voices, it all depends.  Feel very tired, slept alittle last night.  [place] off roof about a month ago.  Patient was given oxycodone 2 tabs for back pain which made him very happy.  Marland Kitchen

## 2019-02-28 DIAGNOSIS — F3132 Bipolar disorder, current episode depressed, moderate: Secondary | ICD-10-CM

## 2019-02-28 MED ORDER — TRAZODONE HCL 50 MG PO TABS
50.0000 mg | ORAL_TABLET | Freq: Every evening | ORAL | Status: DC | PRN
  Administered 2019-02-28 – 2019-03-01 (×3): 50 mg via ORAL
  Filled 2019-02-28 (×3): qty 1

## 2019-02-28 MED ORDER — OXYCODONE-ACETAMINOPHEN 5-325 MG PO TABS
1.0000 | ORAL_TABLET | Freq: Three times a day (TID) | ORAL | Status: DC | PRN
  Administered 2019-02-28 – 2019-03-01 (×2): 1 via ORAL
  Filled 2019-02-28 (×2): qty 1

## 2019-02-28 MED ORDER — ALPRAZOLAM 0.5 MG PO TABS
0.2500 mg | ORAL_TABLET | Freq: Three times a day (TID) | ORAL | Status: DC
  Administered 2019-03-01 – 2019-03-02 (×4): 0.25 mg via ORAL
  Filled 2019-02-28 (×4): qty 1

## 2019-02-28 MED ORDER — QUETIAPINE FUMARATE 300 MG PO TABS
300.0000 mg | ORAL_TABLET | Freq: Every day | ORAL | Status: DC
  Administered 2019-02-28 – 2019-03-01 (×2): 300 mg via ORAL
  Filled 2019-02-28 (×4): qty 1

## 2019-02-28 NOTE — Progress Notes (Addendum)
Pt attended spiritual care group on grief and loss facilitated by chaplain Jerene Pitch  Group Goal:  Support / Education around grief and loss Members engage in facilitated group support and psycho social education.  Group Description:  Following introductions and group rules,  Group members engaged in facilitated group dialog and support around topic of loss, with particular support around experiences of loss in their lives. Group Identified types of loss (relationships / self / things) and identified patterns, circumstances, and changes that precipitate losses. Reflected on thoughts / feelings around loss, normalized grief responses, and recognized variety in grief experience. Patient Progress:  Jermaine [name] was present for first 15 minutes of group.  Left group and did not return.

## 2019-02-28 NOTE — Progress Notes (Signed)
Patient did not attend group because he was asleep.
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
## 2019-02-28 NOTE — [facility] Suicide Risk Assessment (Signed)
[place] INPATIENT:  Family/Significant Other Suicide Prevention Education  Suicide Prevention Education:  Contact Attempts: with wife, Brevin Mcfadden [phone]) has been identified by the patient as the family member/significant other with whom the patient will be residing, and identified as the person(s) who will aid the patient in the event of a mental health crisis.  With written consent from the patient, two attempts were made to provide suicide prevention education, prior to and/or following the patient's discharge.  We were unsuccessful in providing suicide prevention education.  A suicide education pamphlet was given to the patient to share with family/significant other.  Date and time of first attempt:02/28/2019 / 2:36pm   [name] 02/28/2019, 2:36 PM

## 2019-02-28 NOTE — Progress Notes (Addendum)
D   Pt was very upset over his medication changes this evening   Kept asking over and over why did the doctor do that even after he was encouraged to talk to his doctor about the medication changes   Pt can be intrusive and demanding and obsessing over things   He is somewhat flirtatious with other patients A   Verbal support given   Medications administered and effectiveness monitored    Q 15 min checks  R   Pt remains safe at this time  [place] CORONAVIRUS (COVID-19) DAILY CHECK-OFF SYMPTOMS - answer yes or no to each - every day NO YES  Have you had a fever in the past 24 hours?  . Fever (Temp > 37.80C / 100F) X   Have you had any of these symptoms in the past 24 hours? . New Cough .  Sore Throat  .  Shortness of Breath .  Difficulty Breathing .  Unexplained Body Aches   X   Have you had any one of these symptoms in the past 24 hours not related to allergies?   . Runny Nose .  Nasal Congestion .  Sneezing   X   If you have had runny nose, nasal congestion, sneezing in the past 24 hours, has it worsened?  X   EXPOSURES - check yes or no X   Have you traveled outside the state in the past 14 days?  X   Have you been in contact with someone with a confirmed diagnosis of COVID-19 or PUI in the past 14 days without wearing appropriate PPE?  X   Have you been living in the same home as a person with confirmed diagnosis of COVID-19 or a PUI (household contact)?    X   Have you been diagnosed with COVID-19?    X              What to do next: Answered NO to all: Answered YES to anything:   Proceed with unit schedule Follow the [facility] Inpatient Flowsheet.

## 2019-03-01 MED ORDER — OXYCODONE-ACETAMINOPHEN 5-325 MG PO TABS
1.0000 | ORAL_TABLET | Freq: Four times a day (QID) | ORAL | Status: DC | PRN
  Administered 2019-03-01 – 2019-03-02 (×4): 1 via ORAL
  Filled 2019-03-01 (×4): qty 1
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
## 2019-03-01 NOTE — [facility] Suicide Risk Assessment (Signed)
[place] INPATIENT:  Family/Significant Other Suicide Prevention Education  Suicide Prevention Education:  Contact Attempts: with wife, Joniel Graumann [phone]) has been identified by the patient as the family member/significant other with whom the patient will be residing, and identified as the person(s) who will aid the patient in the event of a mental health crisis.  With written consent from the patient, two attempts were made to provide suicide prevention education, prior to and/or following the patient's discharge.  We were unsuccessful in providing suicide prevention education.  A suicide education pamphlet was given to the patient to share with family/significant other.   Date and time of second attempt:03/01/2019 / 9:09am  [name] 03/01/2019, 9:09 AM

## 2019-03-01 NOTE — Plan of Care (Addendum)
Patient self inventory- patient slept poor last night, sleep medication was requested and was not helpful. Appetite is fair, energy level low, concentration poor. Denies SI HI AVH. Endorses pain 7/10 in knee. Patient is taking pain medicine but does not say weather or not it is helpful.  Patient's goal is to work on "myself."  Safety is maintained with 15 minute checks.   Problem: Education: Goal: Knowledge of [facility] Education information/materials will improve Outcome: Progressing Goal: Emotional status will improve Outcome: Progressing Goal: Mental status will improve Outcome: Progressing Goal: Verbalization of understanding the information provided will improve Outcome: Progressing   Problem: Activity: Goal: Interest or engagement in activities will improve Outcome: Progressing
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
## 2019-03-02 LAB — BASIC METABOLIC PANEL
Anion gap: 10 (ref 5–15)
BUN: 11 mg/dL (ref 6–20)
CO2: 28 mmol/L (ref 22–32)
Calcium: 9 mg/dL (ref 8.9–10.3)
Chloride: 100 mmol/L (ref 98–111)
Creatinine, Ser: 0.97 mg/dL (ref 0.61–1.24)
GFR calc Af Amer: >60 mL/min (ref >60)
GFR calc non Af Amer: >60 mL/min (ref >60)
Glucose, Bld: 93 mg/dL (ref 70–99)
Potassium: 4 mmol/L (ref 3.5–5.1)
Sodium: 138 mmol/L (ref 135–145)

## 2019-03-02 LAB — LITHIUM LEVEL: Lithium Lvl: 0.56 mmol/L — ABNORMAL LOW (ref 0.60–1.20)

## 2019-03-02 MED ORDER — ALPRAZOLAM 0.5 MG PO TABS
0.2500 mg | ORAL_TABLET | Freq: Two times a day (BID) | ORAL | Status: DC
  Administered 2019-03-02 – 2019-03-03 (×2): 0.25 mg via ORAL
  Filled 2019-03-02: qty 1

## 2019-03-02 MED ORDER — OXYCODONE-ACETAMINOPHEN 5-325 MG PO TABS
ORAL_TABLET | ORAL | Status: AC
  Administered 2019-03-02: 1 via ORAL
  Filled 2019-03-02: qty 1

## 2019-03-02 MED ORDER — QUETIAPINE FUMARATE 400 MG PO TABS
400.0000 mg | ORAL_TABLET | Freq: Every day | ORAL | Status: DC
  Administered 2019-03-02: 23:00:00 400 mg via ORAL
  Filled 2019-03-02 (×2): qty 1

## 2019-03-02 MED ORDER — OXYCODONE-ACETAMINOPHEN 5-325 MG PO TABS
1.0000 | ORAL_TABLET | ORAL | Status: DC | PRN
  Administered 2019-03-02 – 2019-03-03 (×6): 1 via ORAL
  Filled 2019-03-02 (×5): qty 1

## 2019-03-02 NOTE — Progress Notes (Signed)
Pt was in dayroom upon initial approach. Pt observed being loud and flirtatious with male peers. Pt upset that his pain medications were changed, and that the physician didn't "meet in the middle" with him. Pt constantly asking for any medications that were prn. Pt obsessing over his medications. Pt denies SI/HI or hallucinations (a) 15 min checks (r) safety maintained.
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
## 2019-03-02 NOTE — Progress Notes (Signed)
[facility] NOVEL CORONAVIRUS (COVID-19) DAILY CHECK-OFF SYMPTOMS - answer yes or no to each - every day NO YES  Have you had a fever in the past 24 hours?  . Fever (Temp > 37.80C / 100F) X   Have you had any of these symptoms in the past 24 hours? . New Cough .  Sore Throat  .  Shortness of Breath .  Difficulty Breathing .  Unexplained Body Aches   X   Have you had any one of these symptoms in the past 24 hours not related to allergies?   . Runny Nose .  Nasal Congestion .  Sneezing   X   If you have had runny nose, nasal congestion, sneezing in the past 24 hours, has it worsened?  X   EXPOSURES - check yes or no X   Have you traveled outside the state in the past 14 days?  X   Have you been in contact with someone with a confirmed diagnosis of COVID-19 or PUI in the past 14 days without wearing appropriate PPE?  X   Have you been living in the same home as a person with confirmed diagnosis of COVID-19 or a PUI (household contact)?    X   Have you been diagnosed with COVID-19?    X              What to do next: Answered NO to all: Answered YES to anything:   Proceed with unit schedule Follow the [facility] Inpatient Flowsheet.   
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
## 2019-03-03 MED ORDER — LITHIUM CARBONATE 300 MG PO CAPS: ORAL_CAPSULE | ORAL | 0 refills | Status: DC

## 2019-03-03 MED ORDER — OXCARBAZEPINE 150 MG PO TABS: 150.0000 mg | ORAL_TABLET | Freq: Two times a day (BID) | ORAL | 0 refills | Status: DC

## 2019-03-03 MED ORDER — GABAPENTIN 300 MG PO CAPS: 600.0000 mg | ORAL_CAPSULE | Freq: Three times a day (TID) | ORAL | 0 refills | Status: DC

## 2019-03-03 MED ORDER — HYDROXYZINE HCL 25 MG PO TABS: 25.0000 mg | ORAL_TABLET | Freq: Four times a day (QID) | ORAL | 0 refills | Status: DC | PRN

## 2019-03-03 MED ORDER — CLONIDINE HCL 0.1 MG PO TABS: 0.1000 mg | ORAL_TABLET | Freq: Three times a day (TID) | ORAL | 0 refills | Status: DC

## 2019-03-03 MED ORDER — AMLODIPINE BESYLATE 5 MG PO TABS: 5.0000 mg | ORAL_TABLET | Freq: Every day | ORAL | 0 refills | Status: DC

## 2019-03-03 MED ORDER — VENLAFAXINE HCL ER 75 MG PO CP24: 75.0000 mg | ORAL_CAPSULE | Freq: Every day | ORAL | 0 refills | Status: DC

## 2019-03-03 MED ORDER — METOPROLOL TARTRATE 50 MG PO TABS: 50.0000 mg | ORAL_TABLET | Freq: Two times a day (BID) | ORAL | 0 refills | Status: DC

## 2019-03-03 MED ORDER — QUETIAPINE FUMARATE 400 MG PO TABS: 400.0000 mg | ORAL_TABLET | Freq: Every day | ORAL | 0 refills | Status: DC

## 2019-03-03 MED ORDER — SIMVASTATIN 10 MG PO TABS: 10.0000 mg | ORAL_TABLET | Freq: Every day | ORAL | 0 refills | Status: DC

## 2019-03-03 MED ORDER — NICOTINE POLACRILEX 2 MG MT GUM: 2.0000 mg | CHEWING_GUM | OROMUCOSAL | 0 refills | Status: DC | PRN

## 2019-03-03 NOTE — Progress Notes (Signed)
Care of patient taken over at 0100 , patient resting in bed quietly eyes closed, up x 2 during the night for PRN pain medication and a vistaril for anxiety.  He appears to be back in bed resting quietly at this time.
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
## 2019-03-03 NOTE — Progress Notes (Signed)
CSW has attempted to contact the patient's spouse to inform her that the patient is discharging today. There has been multiple attempts which have been unsuccessful.    Patient is discharging home today. CSW is unaware of the patient's spouse current location, however earlier this week she did confirm that she and her two children were no longer in the home she shares with the patient.     CSW will continue.   Radonna Ricker, MSW, [place] Worker [facility]  Phone: [phone]

## 2019-03-03 NOTE — Progress Notes (Signed)
  [facility] Adult Case Management Discharge Plan :  Will you be returning to the same living situation after discharge:  Yes,  patient reports he plans to return home At discharge, do you have transportation home?: Yes,  taxi voucher Do you have the ability to pay for your medications: No.  Release of information consent forms completed and in the chart;  Patient's signature needed at discharge.  Patient to Follow up at: Follow-up Information    [facility] Follow up on 03/10/2019.   Why: Hospital follow up appointment is Friday, 6/19 at 9:30a.  Please bring your photo ID, insurance card, current medications, and discharge paperwork from this hospitalization.  Contact information: [place]  [phone]           Next level of care provider has access to [place] and Suicide Prevention discussed: Yes,  with the patient  Have you used any form of tobacco in the last 30 days? (Cigarettes, Smokeless Tobacco, Cigars, and/or Pipes): Yes  Has patient been referred to the Quitline?: Patient refused referral  Patient has been referred for addiction treatment: [place], [place] 03/03/2019, 1:13 PM

## 2019-03-03 NOTE — Tx Team (Signed)
Interdisciplinary Treatment and Diagnostic Plan Update  03/03/2019 Time of Session: Jermaine [name] MRN: [id number]  Principal Diagnosis: Bipolar disorder current episode depressed [facility])  Secondary Diagnoses: Principal Problem:   Bipolar disorder current episode depressed ([place]) Active Problems:   Borderline personality disorder ([place])   Bipolar 1 disorder, depressed ([place])   Current Medications:  Current Facility-Administered Medications  Medication Dose Route Frequency Provider Last Rate Last Dose  . acetaminophen (TYLENOL) tablet 650 mg  650 mg Oral Q6H PRN [name], NP   650 mg at 03/01/19 1517  . ALPRAZolam Duanne Moron) tablet 0.25 mg  0.25 mg Oral BID Cobos, Myer Peer, MD   0.25 mg at 03/03/19 0817  . alum & mag hydroxide-simeth (MAALOX/MYLANTA) 200-200-20 MG/5ML suspension 30 mL  30 mL Oral Q4H PRN [name], NP      . amLODipine (NORVASC) tablet 5 mg  5 mg Oral Daily Sharma Covert, MD   5 mg at 03/03/19 0819  . cloNIDine (CATAPRES) tablet 0.1 mg  0.1 mg Oral TID [name], NP   0.1 mg at 03/03/19 0819  . gabapentin (NEURONTIN) capsule 600 mg  600 mg Oral TID Cobos, Myer Peer, MD   600 mg at 03/03/19 0819  . hydrOXYzine (ATARAX/VISTARIL) tablet 25 mg  25 mg Oral Q6H PRN [name], NP   25 mg at 03/03/19 0519  . levothyroxine (SYNTHROID) tablet 50 mcg  50 mcg Oral QAC breakfast [name], NP   50 mcg at 03/03/19 0615  . linaclotide (LINZESS) capsule 290 mcg  290 mcg Oral Daily [name], NP   290 mcg at 03/03/19 0819  . lithium carbonate capsule 300 mg  300 mg Oral Q breakfast [name], NP   300 mg at 03/03/19 0819  . lithium carbonate capsule 600 mg  600 mg Oral QHS Hampton Abbot, MD   600 mg at 03/02/19 2233  . magnesium hydroxide (MILK OF MAGNESIA) suspension 30 mL  30 mL Oral Daily PRN [name], NP      . metoprolol tartrate (LOPRESSOR) tablet 50 mg  50 mg Oral BID [name], NP   50 mg at 03/03/19 6160  . nicotine polacrilex (NICORETTE) gum 2 mg  2 mg Oral PRN Sharma Covert, MD   2 mg at 03/03/19 0616  . OXcarbazepine (TRILEPTAL) tablet 150 mg  150 mg Oral BID [name], NP   150 mg at 03/03/19 0819  . oxyCODONE-acetaminophen (PERCOCET/ROXICET) 5-325 MG per tablet 1 tablet  1 tablet Oral Q4H PRN Cobos, Myer Peer, MD   1 tablet at 03/03/19 0817  . pantoprazole (PROTONIX) EC tablet 40 mg  40 mg Oral Daily [name], NP   40 mg at 03/03/19 0819  . QUEtiapine (SEROQUEL) tablet 400 mg  400 mg Oral QHS Cobos, Myer Peer, MD   400 mg at 03/02/19 2233  . simvastatin (ZOCOR) tablet 10 mg  10 mg Oral q1800 [name], NP   10 mg at 03/02/19 1828  . venlafaxine XR (EFFEXOR-XR) 24 hr capsule 75 mg  75 mg Oral Q breakfast [name], NP   75 mg at 03/03/19 0818   PTA Medications: Medications Prior to Admission  Medication Sig Dispense Refill Last Dose  . ALPRAZolam (XANAX) 0.5 MG tablet Take 0.5 mg by mouth 2 (two) times a day.     . oxyCODONE-acetaminophen (PERCOCET) 10-325 MG tablet Take 1 tablet by mouth 2 (two) times  a day.     . cloNIDine (CATAPRES) 0.1 MG tablet Take 1 tablet (0.1 mg total) by mouth 3 (three) times daily. For high blood pressure 9 tablet 0   . cyclobenzaprine (FLEXERIL) 10 MG tablet Take 10 mg by mouth 3 (three) times daily.     Marland Kitchen desvenlafaxine (PRISTIQ) 50 MG 24 hr tablet Take 1 tablet (50 mg total) by mouth daily. For depression 30 tablet 0   . gabapentin (NEURONTIN) 300 MG capsule Take 2 capsules (600 mg total) by mouth 3 (three) times daily. For agitation 180 capsule 0   . levothyroxine (SYNTHROID, LEVOTHROID) 50 MCG tablet Take 50 mcg by mouth daily before breakfast.     . linaclotide (LINZESS) 290 MCG CAPS capsule Take 1 capsule (290 mcg total) by mouth daily. For constipation 1 capsule 0   . meloxicam (MOBIC) 15 MG tablet Take 15 mg by mouth daily.     . metoprolol tartrate (LOPRESSOR) 50  MG tablet Take 1 tablet (50 mg total) by mouth 2 (two) times daily. 30 tablet 0   . omeprazole (PRILOSEC) 20 MG capsule Take 1 capsule (20 mg total) by mouth daily. For acid reflux 5 capsule 0   . QUEtiapine (SEROQUEL) 400 MG tablet Take 1 tablet (400 mg total) by mouth at bedtime. For mood control 30 tablet 0   . simvastatin (ZOCOR) 10 MG tablet Take 1 tablet (10 mg total) by mouth daily at 6 PM. For high cholesterol (Patient taking differently: Take 20 mg by mouth daily at 6 PM. For high cholesterol) 5 tablet 0   . traZODone (DESYREL) 100 MG tablet Take 100-200 mg by mouth at bedtime.     . [DISCONTINUED] hydrOXYzine (ATARAX/VISTARIL) 25 MG tablet Take 1 tablet (25 mg total) by mouth every 6 (six) hours as needed for anxiety. 60 tablet 0   . [DISCONTINUED] lithium carbonate 300 MG capsule Take 1 tablet (300 mg) by mouth in the morning and 2 tablets (600 mg) at bedtime: mood stabilization (Patient taking differently: Take 300-600 mg by mouth See admin instructions. Take 1 tablet (300 mg) by mouth in the morning and 2 tablets (600 mg) at bedtime: mood stabilization) 90 capsule 0   . [DISCONTINUED] OXcarbazepine (TRILEPTAL) 150 MG tablet Take 1 tablet (150 mg total) by mouth 2 (two) times daily. 10 tablet 0     Patient Stressors: Health problems Medication change or noncompliance  Patient Strengths: Ability for insight Motivation for treatment/growth  Treatment Modalities: Medication Management, Group therapy, Case management,  1 to 1 session with clinician, Psychoeducation, Recreational therapy.   Physician Treatment Plan for Primary Diagnosis: Bipolar disorder current episode depressed ([place]) Long Term Goal(s): Improvement in symptoms so as ready for discharge Improvement in symptoms so as ready for discharge   Short Term Goals: Ability to identify changes in lifestyle to reduce recurrence of condition will improve Ability to verbalize feelings will improve Ability to disclose and discuss  suicidal ideas Ability to demonstrate self-control will improve Ability to identify and develop effective coping behaviors will improve Compliance with prescribed medications will improve  Medication Management: Evaluate patient's response, side effects, and tolerance of medication regimen.  Therapeutic Interventions: 1 to 1 sessions, Unit Group sessions and Medication administration.  Evaluation of Outcomes: Adequate for Discharge  Physician Treatment Plan for Secondary Diagnosis: Principal Problem:   Bipolar disorder current episode depressed ([place]) Active Problems:   Borderline personality disorder ([place])   Bipolar 1 disorder, depressed ([place])  Long Term Goal(s): Improvement in symptoms so  as ready for discharge Improvement in symptoms so as ready for discharge   Short Term Goals: Ability to identify changes in lifestyle to reduce recurrence of condition will improve Ability to verbalize feelings will improve Ability to disclose and discuss suicidal ideas Ability to demonstrate self-control will improve Ability to identify and develop effective coping behaviors will improve Compliance with prescribed medications will improve     Medication Management: Evaluate patient's response, side effects, and tolerance of medication regimen.  Therapeutic Interventions: 1 to 1 sessions, Unit Group sessions and Medication administration.  Evaluation of Outcomes: Adequate for Discharge   RN Treatment Plan for Primary Diagnosis: Bipolar disorder current episode depressed ([place]) Long Term Goal(s): Knowledge of disease and therapeutic regimen to maintain health will improve  Short Term Goals: Ability to verbalize frustration and anger appropriately will improve, Ability to participate in decision making will improve, Ability to verbalize feelings will improve, Ability to disclose and discuss suicidal ideas and Ability to identify and develop effective coping behaviors will improve  Medication  Management: RN will administer medications as ordered by provider, will assess and evaluate patient's response and provide education to patient for prescribed medication. RN will report any adverse and/or side effects to prescribing provider.  Therapeutic Interventions: 1 on 1 counseling sessions, Psychoeducation, Medication administration, Evaluate responses to treatment, Monitor vital signs and CBGs as ordered, Perform/monitor CIWA, COWS, AIMS and Fall Risk screenings as ordered, Perform wound care treatments as ordered.  Evaluation of Outcomes: Adequate for Discharge   LCSW Treatment Plan for Primary Diagnosis: Bipolar disorder current episode depressed ([place]) Long Term Goal(s): Safe transition to appropriate next level of care at discharge, Engage patient in therapeutic group addressing interpersonal concerns.  Short Term Goals: Engage patient in aftercare planning with referrals and resources  Therapeutic Interventions: Assess for all discharge needs, 1 to 1 time with Social worker, Explore available resources and support systems, Assess for adequacy in community support network, Educate family and significant other(s) on suicide prevention, Complete Psychosocial Assessment, Interpersonal group therapy.  Evaluation of Outcomes: Adequate for Discharge   Progress in Treatment: Attending groups: No. New to unit Participating in groups: No. Taking medication as prescribed: Yes. Toleration medication: Yes. Family/Significant other contact made: No, will contact:  the patient's wife Patient understands diagnosis: Yes. Discussing patient identified problems/goals with staff: Yes. Medical problems stabilized or resolved: Yes. Denies suicidal/homicidal ideation: Yes. Issues/concerns per patient self-inventory: No. Other:   New problem(s) identified: None   New Short Term/Long Term Goal(s):medication stabilization, elimination of SI thoughts, development of comprehensive mental wellness  plan.    Patient Goals:    Discharge Plan or Barriers: Patient plans to return home with his spouse and children. He will continue to follow up with [facility] for outpatient medication management and therapy services. CSW will continue to follow for any additional referrals and possible discharge planning.   Reason for Continuation of Hospitalization:   Estimated Length of Stay: 03/03/2019  Attendees: Patient: 03/03/2019 11:21 AM  Physician: Dr. Myles Lipps, MD; Dr. Neita Garnet, MD 03/03/2019 11:21 AM  Nursing: Rise [email or web address].K, RN; Patrice.Viona Gilmore, RN 03/03/2019 11:21 AM  RN Care Manager: 03/03/2019 11:21 AM  Social Worker: Radonna Ricker, [place] 03/03/2019 11:21 AM  Recreational Therapist:  03/03/2019 11:21 AM  Other:  03/03/2019 11:21 AM  Other:  03/03/2019 11:21 AM  Other: 03/03/2019 11:21 AM    Scribe for Treatment Team: [name], [place] 03/03/2019 11:21 AM

## 2019-03-03 NOTE — Progress Notes (Signed)
Pt denies SI/HI. Pt received both written and verbal discharge instructions. Pt verbalized understanding of discharge instructions. Pt agreed to f/u appt and med regimen. Pt received an AVS, SRA, transitional record and belongings from locker. Pt safely discharged to the lobby. Pt traveled via taxi cab provided by CSW.

## 2019-03-03 NOTE — Plan of Care (Signed)
  Problem: Education: °Goal: Knowledge of [facility] Education information/materials will improve °Outcome: Adequate for Discharge °Goal: Emotional status will improve °Outcome: Adequate for Discharge °Goal: Mental status will improve °Outcome: Adequate for Discharge °Goal: Verbalization of understanding the information provided will improve °Outcome: Adequate for Discharge °  °Problem: Activity: °Goal: Interest or engagement in activities will improve °Outcome: Adequate for Discharge °Goal: Sleeping patterns will improve °Outcome: Adequate for Discharge °  °Problem: Coping: °Goal: Ability to verbalize frustrations and anger appropriately will improve °Outcome: Adequate for Discharge °Goal: Ability to demonstrate self-control will improve °Outcome: Adequate for Discharge °  °Problem: Health Behavior/Discharge Planning: °Goal: Identification of resources available to assist in meeting health care needs will improve °Outcome: Adequate for Discharge °Goal: Compliance with treatment plan for underlying cause of condition will improve °Outcome: Adequate for Discharge °  °Problem: Physical Regulation: °Goal: Ability to maintain clinical measurements within normal limits will improve °Outcome: Adequate for Discharge °  °Problem: Safety: °Goal: Periods of time without injury will increase °Outcome: Adequate for Discharge °  °Problem: Education: °Goal: Ability to make informed decisions regarding treatment will improve °Outcome: Adequate for Discharge °  °Problem: Coping: °Goal: Coping ability will improve °Outcome: Adequate for Discharge °  °Problem: Health Behavior/Discharge Planning: °Goal: Identification of resources available to assist in meeting health care needs will improve °Outcome: Adequate for Discharge °  °Problem: Medication: °Goal: Compliance with prescribed medication regimen will improve °Outcome: Adequate for Discharge °  °Problem: Self-Concept: °Goal: Ability to disclose and discuss suicidal ideas  will improve °Outcome: Adequate for Discharge °Goal: Will verbalize positive feelings about self °Outcome: Adequate for Discharge °  °Problem: Education: °Goal: Utilization of techniques to improve thought processes will improve °Outcome: Adequate for Discharge °Goal: Knowledge of the prescribed therapeutic regimen will improve °Outcome: Adequate for Discharge °  °Problem: Activity: °Goal: Interest or engagement in leisure activities will improve °Outcome: Adequate for Discharge °Goal: Imbalance in normal sleep/wake cycle will improve °Outcome: Adequate for Discharge °  °Problem: Coping: °Goal: Coping ability will improve °Outcome: Adequate for Discharge °Goal: Will verbalize feelings °Outcome: Adequate for Discharge °  °Problem: Health Behavior/Discharge Planning: °Goal: Ability to make decisions will improve °Outcome: Adequate for Discharge °Goal: Compliance with therapeutic regimen will improve °Outcome: Adequate for Discharge °  °Problem: Role Relationship: °Goal: Will demonstrate positive changes in social behaviors and relationships °Outcome: Adequate for Discharge °  °Problem: Safety: °Goal: Ability to disclose and discuss suicidal ideas will improve °Outcome: Adequate for Discharge °Goal: Ability to identify and utilize support systems that promote safety will improve °Outcome: Adequate for Discharge °  °Problem: Self-Concept: °Goal: Will verbalize positive feelings about self °Outcome: Adequate for Discharge °Goal: Level of anxiety will decrease °Outcome: Adequate for Discharge °  °Problem: Education: °Goal: Ability to state activities that reduce stress will improve °Outcome: Adequate for Discharge °  °Problem: Coping: °Goal: Ability to identify and develop effective coping behavior will improve °Outcome: Adequate for Discharge °  °Problem: Self-Concept: °Goal: Ability to identify factors that promote anxiety will improve °Outcome: Adequate for Discharge °Goal: Level of anxiety will decrease °Outcome:  Adequate for Discharge °Goal: Ability to modify response to factors that promote anxiety will improve °Outcome: Adequate for Discharge °  °

## 2019-03-06 ENCOUNTER — Ambulatory Visit: Admit: 2019-03-06 | Payer: Medicaid Other | Admitting: Urology

## 2019-03-06 SURGERY — HYDROCELECTOMY
Anesthesia: General | Laterality: Left

## 2019-03-08 ENCOUNTER — Telehealth: Payer: Self-pay | Admitting: Orthopedic Surgery

## 2019-03-08 NOTE — Telephone Encounter (Signed)
Patient called and stated he checked with the hospital and they told him he was not scheduled for pre-op or surgery for 6/25. He did receive the order for crutches you sent. He wants to be sure Dr. Aline Brochure does have him scheduled for surgery on 6/25 states his knee is really hurting him.  Please call and advise

## 2019-03-08 NOTE — Telephone Encounter (Signed)
Orders are in, he needs to call Carolyn [phone] I left message for him to advise

## 2019-03-09 NOTE — Patient Instructions (Signed)
Jermaine [name]  03/09/2019     @PREFPERIOPPHARMACY @   Your procedure is scheduled on  03/16/2019 .  Report to [facility] at  [email or web address].  Call this number if you have problems the morning of surgery:  [phone]   Remember:  Do not eat or drink after midnight.                     Take these medicines the morning of surgery with A SIP OF WATER  Hydroxyzine(if needed), levothyroxine, lithium,metoprolol, prilosec, xanax(if needed), amlodipine, clonidine, gabapentin, oxcarbazepine, oxycodone(if needed), effexor.    Do not wear jewelry, make-up or nail polish.  Do not wear lotions, powders, or perfumes, or deodorant.  Do not shave 48 hours prior to surgery.  Men may shave face and neck.  Do not bring valuables to the hospital.  [facility] : [facility] is not responsible for any belongings or valuables.  Contacts, dentures or bridgework may not be worn into surgery.  Leave your suitcase in the car.  After surgery it may be brought to your room.  For patients admitted to the hospital, discharge time will be determined by your treatment team.  Patients discharged the day of surgery will not be allowed to drive home.   Name and phone number of your driver:   family Special instructions:  None  Please read over the following fact sheets that you were given. Anesthesia Post-op Instructions and Care and Recovery After Surgery       Arthroscopic Knee Ligament Repair, Care After This sheet gives you information about how to care for yourself after your procedure. Your health care provider may also give you more specific instructions. If you have problems or questions, contact your health care provider. What can I expect after the procedure? After the procedure, it is common to have:  Pain in your knee.  Bruising and swelling on your knee, calf, and ankle for 3-4 days.  Fatigue. Follow these instructions at home: If you have a brace or immobilizer:  Wear the brace or  immobilizer as told by your health care provider. Remove it only as told by your health care provider.  Loosen the splint or immobilizer if your toes tingle, become numb, or turn cold and blue.  Keep the brace or immobilizer clean. Bathing  Do not take baths, swim, or use a hot tub until your health care provider approves. Ask your health care provider if you can take showers.  Keep your bandage (dressing) dry until your health care provider says that it can be removed. Cover it and your brace or immobilizer with a watertight covering when you take a shower. Incision care   Follow instructions from your health care provider about how to take care of your incision. Make sure you: ? Wash your hands with soap and water before you change your bandage (dressing). If soap and water are not available, use hand sanitizer. ? Change your dressing as told by your health care provider. ? Leave stitches (sutures), skin glue, or adhesive strips in place. These skin closures may need to stay in place for 2 weeks or longer. If adhesive strip edges start to loosen and curl up, you may trim the loose edges. Do not remove adhesive strips completely unless your health care provider tells you to do that.  Check your incision area every day for signs of infection. Check for: ? More redness, swelling, or pain. ? More  fluid or blood. ? Warmth. ? Pus or a bad smell. Managing pain, stiffness, and swelling   If directed, put ice on the affected area. ? If you have a removable brace or immobilizer, remove it as told by your health care provider. ? Put ice in a plastic bag. ? Place a towel between your skin and the bag or between your brace or immobilizer and the bag. ? Leave the ice on for 20 minutes, 2-3 times a day.  Move your toes often to avoid stiffness and to lessen swelling.  Raise (elevate) the injured area above the level of your heart while you are sitting or lying down. Driving  Do not drive  until your health care provider approves. If you have a brace or immobilizer on your leg, ask your health care provider when it is safe for you to drive.  Do not drive or use heavy machinery while taking prescription pain medicine. Activity  Rest as directed. Ask your health care provider what activities are safe for you.  Do physical therapy exercises as told by your health care provider. Physical therapy will help you regain strength and motion in your knee.  Follow instructions from your health care provider about: ? When you may start motion exercises. ? When you may start riding a stationary bike and doing other low-impact activities. ? When you may start to jog and do other high-impact activities. Safety  Do not use the injured limb to support your body weight until your health care provider says that you can. Use crutches as told by your health care provider. General instructions  Do not use any products that contain nicotine or tobacco, such as cigarettes and e-cigarettes. These can delay bone healing. If you need help quitting, ask your health care provider.  To prevent or treat constipation while you are taking prescription pain medicine, your health care provider may recommend that you: ? Drink enough fluid to keep your urine clear or pale yellow. ? Take over-the-counter or prescription medicines. ? Eat foods that are high in fiber, such as fresh fruits and vegetables, whole grains, and beans. ? Limit foods that are high in fat and processed sugars, such as fried and sweet foods.  Take over-the-counter and prescription medicines only as told by your health care provider.  Keep all follow-up visits as told by your health care provider. This is important. Contact a health care provider if:  You have more redness, swelling, or pain around an incision.  You have more fluid or blood coming from an incision.  Your incision feels warm to the touch.  You have a fever.  You  have pain or swelling in your knee, and it gets worse.  You have pain that does not get better with medicine. Get help right away if:  You have trouble breathing.  You have pus or a bad smell coming from an incision.  You have numbness and tingling near the knee joint. Summary  After the procedure, it is common to have knee pain with bruising and swelling on your knee, calf, and ankle.  Icing your knee and raising your leg above the level of your heart will help control the pain and the swelling.  Do physical therapy exercises as told by your health care provider. Physical therapy will help you regain strength and motion in your knee. This information is not intended to replace advice given to you by your health care provider. Make sure you discuss any questions you have  with your health care provider. Document Released: 06/28/2013 Document Revised: 09/01/2016 Document Reviewed: 09/01/2016 Elsevier Interactive Patient Education  2019 [place] Anesthesia, Adult, Care After This sheet gives you information about how to care for yourself after your procedure. Your health care provider may also give you more specific instructions. If you have problems or questions, contact your health care provider. What can I expect after the procedure? After the procedure, the following side effects are common:  Pain or discomfort at the IV site.  Nausea.  Vomiting.  Sore throat.  Trouble concentrating.  Feeling cold or chills.  Weak or tired.  Sleepiness and fatigue.  Soreness and body aches. These side effects can affect parts of the body that were not involved in surgery. Follow these instructions at home:  For at least 24 hours after the procedure:  Have a responsible adult stay with you. It is important to have someone help care for you until you are awake and alert.  Rest as needed.  Do not: ? Participate in activities in which you could fall or become injured.  ? Drive. ? Use heavy machinery. ? Drink alcohol. ? Take sleeping pills or medicines that cause drowsiness. ? Make important decisions or sign legal documents. ? Take care of children on your own. Eating and drinking  Follow any instructions from your health care provider about eating or drinking restrictions.  When you feel hungry, start by eating small amounts of foods that are soft and easy to digest (bland), such as toast. Gradually return to your regular diet.  Drink enough fluid to keep your urine pale yellow.  If you vomit, rehydrate by drinking water, juice, or clear broth. General instructions  If you have sleep apnea, surgery and certain medicines can increase your risk for breathing problems. Follow instructions from your health care provider about wearing your sleep device: ? Anytime you are sleeping, including during daytime naps. ? While taking prescription pain medicines, sleeping medicines, or medicines that make you drowsy.  Return to your normal activities as told by your health care provider. Ask your health care provider what activities are safe for you.  Take over-the-counter and prescription medicines only as told by your health care provider.  If you smoke, do not smoke without supervision.  Keep all follow-up visits as told by your health care provider. This is important. Contact a health care provider if:  You have nausea or vomiting that does not get better with medicine.  You cannot eat or drink without vomiting.  You have pain that does not get better with medicine.  You are unable to pass urine.  You develop a skin rash.  You have a fever.  You have redness around your IV site that gets worse. Get help right away if:  You have difficulty breathing.  You have chest pain.  You have blood in your urine or stool, or you vomit blood. Summary  After the procedure, it is common to have a sore throat or nausea. It is also common to feel tired.   Have a responsible adult stay with you for the first 24 hours after general anesthesia. It is important to have someone help care for you until you are awake and alert.  When you feel hungry, start by eating small amounts of foods that are soft and easy to digest (bland), such as toast. Gradually return to your regular diet.  Drink enough fluid to keep your urine pale yellow.  Return to your  normal activities as told by your health care provider. Ask your health care provider what activities are safe for you. This information is not intended to replace advice given to you by your health care provider. Make sure you discuss any questions you have with your health care provider. Document Released: 12/14/2000 Document Revised: 04/23/2017 Document Reviewed: 04/23/2017 Elsevier Interactive Patient Education  2019 [place]. How to Use Chlorhexidine Before Surgery Chlorhexidine gluconate (CHG) is a germ-killing (antiseptic) solution that is used to clean the skin. It gets rid of the bacteria that normally live on the skin. Cleaning your skin with CHG before surgery helps lower the risk for infection after surgery. To clean your skin before surgery, you may be given:  A CHG solution to use in the shower.  A prepackaged cloth that contains CHG. What are the risks? Risks of using CHG include:  A skin reaction.  Hearing loss, if CHG gets in your ears.  Eye injury, if CHG gets in your eyes and is not rinsed out.  The CHG product catching fire. Make sure that you avoid smoking and flames after applying CHG to your skin. Do not use CHG:  If you have a chlorhexidine allergy or have previously reacted to chlorhexidine.  On babies younger than 17 months of age. How to use CHG solution   Use CHG only as told by your health care provider, and follow the instructions on the label.  Use CHG solution while taking a shower. Follow these steps when using CHG solution (unless your health care  provider gives you different instructions): 1. Start the shower. 2. Use your normal soap and shampoo to wash your face and hair. 3. Turn off the shower or move out of the shower stream. 4. Pour the CHG onto a clean washcloth. Do not use any type of brush or rough-edged sponge. 5. Starting at your neck, lather your body down to your toes. Make sure you:  Pay special attention to the part of your body where you will be having surgery. Scrub this area for at least 1 minute.  Use the full amount of CHG as directed. Usually, this is one bottle.  Do not use CHG on your head or face. If the solution gets into your ears or eyes, rinse them well with water.  Avoid your genital area.  Avoid any areas of skin that have broken skin, cuts, or scrapes.  Scrub your back and under your arms. Make sure to wash skin folds. 6. Let the lather sit on your skin for 1-2 minutes or as long as told by your health care provider. 7. Thoroughly rinse your entire body in the shower. Make sure that all body creases and crevices are rinsed well. 8. Dry off with a clean towel. Do not put any substances on your body afterward, such as powder, lotion, or perfume. 9. Put on clean clothes or pajamas. 10. If it is the night before your surgery, sleep in clean sheets. How to use CHG prepackaged cloths   Only use CHG cloths as told by your health care provider, and follow the instructions on the label.  Use the CHG cloth on clean, dry skin. Follow these steps when using a CHG cloth (unless your health care provider gives you different instructions): 1. Using the CHG cloth, vigorously scrub the part of your body where you will be having surgery. Scrub using a back-and-forth motion for 3 minutes. The area on your body should be completely wet with CHG when  you are done scrubbing. 2. Do not rinse. Discard the cloth and let the area air-dry for 1 minute. Do not put any substances on your body afterward, such as powder, lotion,  or perfume. 3. Put on clean clothes or pajamas. 4. If it is the night before your surgery, sleep in clean sheets. Contact a health care provider if:  Your skin gets irritated after scrubbing.  You have questions about using your solution or cloth. Get help right away if:  Your eyes become very red or swollen.  Your eyes itch badly.  Your skin itches badly and is red or swollen.  Your hearing changes.  You have trouble seeing.  You have swelling or tingling in your mouth or throat.  You have trouble breathing.  You swallow any chlorhexidine. Summary  Chlorhexidine gluconate (CHG) is a germ-killing (antiseptic) solution that is used to clean the skin. Cleaning your skin with CHG before surgery helps lower the risk for infection after surgery.  You may be given CHG to use at home. It may be in a bottle or in a prepackaged cloth to use on your skin. Carefully follow your health care provider's instructions and the instructions on the product label.  Do not use CHG if you have a chlorhexidine allergy.  Contact your health care provider if your skin gets irritated after scrubbing. This information is not intended to replace advice given to you by your health care provider. Make sure you discuss any questions you have with your health care provider. Document Released: 06/01/2012 Document Revised: 08/05/2017 Document Reviewed: 08/05/2017 Elsevier Interactive Patient Education  2019 [employer].

## 2019-03-13 ENCOUNTER — Other Ambulatory Visit: Payer: Self-pay

## 2019-03-13 ENCOUNTER — Encounter (HOSPITAL_COMMUNITY)
Admission: RE | Admit: 2019-03-13 | Discharge: 2019-03-13 | Disposition: A | Discharge status: Home / Self Care | Payer: Medicaid Other | Source: Ambulatory Visit | Attending: Orthopedic Surgery | Admitting: Orthopedic Surgery

## 2019-03-13 ENCOUNTER — Other Ambulatory Visit (HOSPITAL_COMMUNITY)
Admission: RE | Admit: 2019-03-13 | Discharge: 2019-03-13 | Disposition: A | Discharge status: Home / Self Care | Payer: Medicaid Other | Source: Ambulatory Visit | Attending: Orthopedic Surgery | Admitting: Orthopedic Surgery

## 2019-03-13 DIAGNOSIS — Z1159 Encounter for screening for other viral diseases: Secondary | ICD-10-CM | POA: Insufficient documentation

## 2019-03-13 DIAGNOSIS — Z01812 Encounter for preprocedural laboratory examination: Secondary | ICD-10-CM | POA: Insufficient documentation

## 2019-03-14 LAB — NOVEL CORONAVIRUS, NAA (HOSP ORDER, SEND-OUT TO REF LAB; TAT 18-24 HRS): SARS-CoV-2, NAA: NOT DETECTED
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
## 2019-03-16 ENCOUNTER — Encounter (HOSPITAL_COMMUNITY): Payer: Self-pay

## 2019-03-16 ENCOUNTER — Ambulatory Visit (HOSPITAL_COMMUNITY)
Admission: RE | Admit: 2019-03-16 | Discharge: 2019-03-16 | Disposition: A | Discharge status: Home / Self Care | Payer: Medicaid Other | Attending: Orthopedic Surgery | Admitting: Orthopedic Surgery

## 2019-03-16 ENCOUNTER — Ambulatory Visit (HOSPITAL_COMMUNITY): Payer: Medicaid Other | Admitting: Anesthesiology

## 2019-03-16 ENCOUNTER — Other Ambulatory Visit: Payer: Self-pay

## 2019-03-16 ENCOUNTER — Encounter (HOSPITAL_COMMUNITY)
Admission: RE | Disposition: A | Discharge status: Home / Self Care | Payer: Self-pay | Source: Home / Self Care | Attending: Orthopedic Surgery

## 2019-03-16 DIAGNOSIS — Z87891 Personal history of nicotine dependence: Secondary | ICD-10-CM | POA: Insufficient documentation

## 2019-03-16 DIAGNOSIS — F319 Bipolar disorder, unspecified: Secondary | ICD-10-CM | POA: Diagnosis not present

## 2019-03-16 DIAGNOSIS — I10 Essential (primary) hypertension: Secondary | ICD-10-CM | POA: Insufficient documentation

## 2019-03-16 DIAGNOSIS — M23261 Derangement of other lateral meniscus due to old tear or injury, right knee: Secondary | ICD-10-CM | POA: Diagnosis present

## 2019-03-16 DIAGNOSIS — I2510 Atherosclerotic heart disease of native coronary artery without angina pectoris: Secondary | ICD-10-CM | POA: Diagnosis not present

## 2019-03-16 DIAGNOSIS — Z791 Long term (current) use of non-steroidal anti-inflammatories (NSAID): Secondary | ICD-10-CM | POA: Insufficient documentation

## 2019-03-16 DIAGNOSIS — Z79890 Hormone replacement therapy (postmenopausal): Secondary | ICD-10-CM | POA: Diagnosis not present

## 2019-03-16 DIAGNOSIS — F603 Borderline personality disorder: Secondary | ICD-10-CM | POA: Insufficient documentation

## 2019-03-16 DIAGNOSIS — G4730 Sleep apnea, unspecified: Secondary | ICD-10-CM | POA: Diagnosis not present

## 2019-03-16 DIAGNOSIS — F419 Anxiety disorder, unspecified: Secondary | ICD-10-CM | POA: Insufficient documentation

## 2019-03-16 DIAGNOSIS — R569 Unspecified convulsions: Secondary | ICD-10-CM | POA: Diagnosis not present

## 2019-03-16 DIAGNOSIS — Z79899 Other long term (current) drug therapy: Secondary | ICD-10-CM | POA: Diagnosis not present

## 2019-03-16 DIAGNOSIS — K219 Gastro-esophageal reflux disease without esophagitis: Secondary | ICD-10-CM | POA: Diagnosis not present

## 2019-03-16 HISTORY — PX: KNEE ARTHROSCOPY WITH MENISCAL REPAIR: SHX5653

## 2019-03-16 SURGERY — ARTHROSCOPY, KNEE, WITH MENISCUS REPAIR
Anesthesia: General | Site: Knee | Laterality: Right

## 2019-03-16 MED ORDER — MIDAZOLAM HCL 2 MG/2ML IJ SOLN
INTRAMUSCULAR | Status: AC
  Filled 2019-03-16: qty 2

## 2019-03-16 MED ORDER — BUPIVACAINE-EPINEPHRINE (PF) 0.5% -1:200000 IJ SOLN
INTRAMUSCULAR | Status: AC
  Filled 2019-03-16: qty 60

## 2019-03-16 MED ORDER — BUPIVACAINE-EPINEPHRINE (PF) 0.5% -1:200000 IJ SOLN
INTRAMUSCULAR | Status: DC | PRN
  Administered 2019-03-16: 60 mL

## 2019-03-16 MED ORDER — OXYCODONE-ACETAMINOPHEN 10-325 MG PO TABS: 1.0000 | ORAL_TABLET | ORAL | 0 refills | Status: DC | PRN

## 2019-03-16 MED ORDER — PROMETHAZINE HCL 12.5 MG PO TABS: 12.5000 mg | ORAL_TABLET | Freq: Four times a day (QID) | ORAL | 0 refills | Status: DC | PRN

## 2019-03-16 MED ORDER — FENTANYL CITRATE (PF) 250 MCG/5ML IJ SOLN
INTRAMUSCULAR | Status: AC
  Filled 2019-03-16: qty 5

## 2019-03-16 MED ORDER — FENTANYL CITRATE (PF) 100 MCG/2ML IJ SOLN
INTRAMUSCULAR | Status: DC | PRN
  Administered 2019-03-16: 50 ug via INTRAVENOUS
  Administered 2019-03-16: 25 ug via INTRAVENOUS
  Administered 2019-03-16: 50 ug via INTRAVENOUS
  Administered 2019-03-16 (×2): 25 ug via INTRAVENOUS
  Administered 2019-03-16: 50 ug via INTRAVENOUS

## 2019-03-16 MED ORDER — 0.9 % SODIUM CHLORIDE (POUR BTL) OPTIME
TOPICAL | Status: DC | PRN
  Administered 2019-03-16: 1000 mL

## 2019-03-16 MED ORDER — HYDROMORPHONE HCL 1 MG/ML IJ SOLN
0.2500 mg | INTRAMUSCULAR | Status: DC | PRN
  Administered 2019-03-16: 0.5 mg via INTRAVENOUS
  Filled 2019-03-16: qty 0.5

## 2019-03-16 MED ORDER — EPINEPHRINE PF 1 MG/ML IJ SOLN
INTRAMUSCULAR | Status: AC
  Filled 2019-03-16: qty 4

## 2019-03-16 MED ORDER — ONDANSETRON HCL 4 MG/2ML IJ SOLN
INTRAMUSCULAR | Status: AC
  Filled 2019-03-16: qty 2

## 2019-03-16 MED ORDER — KETOROLAC TROMETHAMINE 30 MG/ML IJ SOLN
30.0000 mg | Freq: Once | INTRAMUSCULAR | Status: AC
  Administered 2019-03-16: 30 mg via INTRAVENOUS

## 2019-03-16 MED ORDER — PROPOFOL 10 MG/ML IV BOLUS
INTRAVENOUS | Status: AC
  Filled 2019-03-16: qty 40

## 2019-03-16 MED ORDER — EPINEPHRINE PF 1 MG/ML IJ SOLN
INTRAMUSCULAR | Status: AC
  Filled 2019-03-16: qty 3

## 2019-03-16 MED ORDER — LACTATED RINGERS IV SOLN
INTRAVENOUS | Status: DC
  Administered 2019-03-16: 10:00:00 via INTRAVENOUS

## 2019-03-16 MED ORDER — SODIUM CHLORIDE 0.9 % IR SOLN
Status: DC | PRN
  Administered 2019-03-16 (×4): 3000 mL

## 2019-03-16 MED ORDER — LACTATED RINGERS IV SOLN: INTRAVENOUS | Status: DC

## 2019-03-16 MED ORDER — PROPOFOL 10 MG/ML IV BOLUS
INTRAVENOUS | Status: DC | PRN
  Administered 2019-03-16: 200 mg via INTRAVENOUS

## 2019-03-16 MED ORDER — IBUPROFEN 800 MG PO TABS: 800.0000 mg | ORAL_TABLET | Freq: Three times a day (TID) | ORAL | 1 refills | Status: DC | PRN

## 2019-03-16 MED ORDER — KETOROLAC TROMETHAMINE 30 MG/ML IJ SOLN
INTRAMUSCULAR | Status: AC
  Filled 2019-03-16: qty 1

## 2019-03-16 MED ORDER — MIDAZOLAM HCL 5 MG/5ML IJ SOLN
INTRAMUSCULAR | Status: DC | PRN
  Administered 2019-03-16: 2 mg via INTRAVENOUS

## 2019-03-16 MED ORDER — PROMETHAZINE HCL 25 MG/ML IJ SOLN: 6.2500 mg | INTRAMUSCULAR | Status: DC | PRN

## 2019-03-16 MED ORDER — CEFAZOLIN SODIUM-DEXTROSE 2-4 GM/100ML-% IV SOLN
2.0000 g | INTRAVENOUS | Status: AC
  Administered 2019-03-16: 2 g via INTRAVENOUS
  Filled 2019-03-16: qty 100

## 2019-03-16 MED ORDER — OXYCODONE HCL 5 MG PO TABS
ORAL_TABLET | ORAL | Status: AC
  Filled 2019-03-16: qty 1

## 2019-03-16 MED ORDER — HYDROCODONE-ACETAMINOPHEN 7.5-325 MG PO TABS: 1.0000 | ORAL_TABLET | Freq: Once | ORAL | Status: DC | PRN

## 2019-03-16 MED ORDER — CHLORHEXIDINE GLUCONATE 4 % EX LIQD: 60.0000 mL | Freq: Once | CUTANEOUS | Status: DC

## 2019-03-16 MED ORDER — MEPERIDINE HCL 50 MG/ML IJ SOLN: 6.2500 mg | INTRAMUSCULAR | Status: DC | PRN

## 2019-03-16 MED ORDER — ONDANSETRON HCL 4 MG/2ML IJ SOLN
4.0000 mg | Freq: Once | INTRAMUSCULAR | Status: AC
  Administered 2019-03-16: 4 mg via INTRAVENOUS

## 2019-03-16 MED ORDER — OXYCODONE HCL 5 MG PO TABS
5.0000 mg | ORAL_TABLET | Freq: Once | ORAL | Status: AC
  Administered 2019-03-16: 5 mg via ORAL

## 2019-03-16 SURGICAL SUPPLY — 48 items
APL PRP STRL LF DISP 70% ISPRP (MISCELLANEOUS) ×2
BANDAGE ELASTIC 6 LF NS (GAUZE/BANDAGES/DRESSINGS) ×3 IMPLANT
BLADE 11 SAFETY STRL DISP (BLADE) ×3 IMPLANT
BNDG CMPR MED 5X6 ELC HKLP NS (GAUZE/BANDAGES/DRESSINGS) ×1
BNDG GAUZE ELAST 4 BULKY (GAUZE/BANDAGES/DRESSINGS) ×2 IMPLANT
CHLORAPREP W/TINT 26 (MISCELLANEOUS) ×6 IMPLANT
CLOSURE WOUND 1/2 X4 (GAUZE/BANDAGES/DRESSINGS) ×1
CLOTH BEACON ORANGE TIMEOUT ST (SAFETY) ×3 IMPLANT
COOLER CRYO IC GRAV AND TUBE (ORTHOPEDIC SUPPLIES) ×3 IMPLANT
COVER WAND RF STERILE (DRAPES) ×3 IMPLANT
CUFF CRYO KNEE18X23 MED (MISCELLANEOUS) ×2 IMPLANT
CUFF TOURN SGL QUICK 34 (TOURNIQUET CUFF) ×3
CUFF TRNQT CYL 34X4.125X (TOURNIQUET CUFF) IMPLANT
DECANTER SPIKE VIAL GLASS SM (MISCELLANEOUS) ×6 IMPLANT
GAUZE 4X4 16PLY RFD (DISPOSABLE) ×3 IMPLANT
GAUZE SPONGE 4X4 12PLY STRL (GAUZE/BANDAGES/DRESSINGS) ×2 IMPLANT
GAUZE XEROFORM 5X9 LF (GAUZE/BANDAGES/DRESSINGS) ×3 IMPLANT
GLOVE BIO SURGEON STRL SZ7 (GLOVE) ×2 IMPLANT
GLOVE BIOGEL PI IND STRL 7.0 (GLOVE) ×1 IMPLANT
GLOVE BIOGEL PI INDICATOR 7.0 (GLOVE) ×4
GLOVE SKINSENSE NS SZ8.0 LF (GLOVE) ×2
GLOVE SKINSENSE STRL SZ8.0 LF (GLOVE) ×1 IMPLANT
GLOVE SS N UNI LF 8.5 STRL (GLOVE) ×3 IMPLANT
GOWN STRL REUS W/TWL LRG LVL3 (GOWN DISPOSABLE) ×9 IMPLANT
GOWN STRL REUS W/TWL XL LVL3 (GOWN DISPOSABLE) ×3 IMPLANT
IV NS IRRIG 3000ML ARTHROMATIC (IV SOLUTION) ×10 IMPLANT
KIT BLADEGUARD II DBL (SET/KITS/TRAYS/PACK) ×3 IMPLANT
KIT TURNOVER CYSTO (KITS) ×3 IMPLANT
MANIFOLD NEPTUNE II (INSTRUMENTS) ×3 IMPLANT
MARKER SKIN DUAL TIP RULER LAB (MISCELLANEOUS) ×3 IMPLANT
NDL HYPO 18GX1.5 BLUNT FILL (NEEDLE) ×1 IMPLANT
NDL HYPO 21X1.5 SAFETY (NEEDLE) ×1 IMPLANT
NDL SPNL 18GX3.5 QUINCKE PK (NEEDLE) ×1 IMPLANT
NEEDLE HYPO 18GX1.5 BLUNT FILL (NEEDLE) ×3 IMPLANT
NEEDLE HYPO 21X1.5 SAFETY (NEEDLE) ×3 IMPLANT
NEEDLE SPNL 18GX3.5 QUINCKE PK (NEEDLE) ×3 IMPLANT
NS IRRIG 1000ML POUR BTL (IV SOLUTION) ×2 IMPLANT
PACK ARTHRO LIMB DRAPE STRL (MISCELLANEOUS) ×3 IMPLANT
PAD ABD 5X9 TENDERSORB (GAUZE/BANDAGES/DRESSINGS) ×3 IMPLANT
PAD ARMBOARD 7.5X6 YLW CONV (MISCELLANEOUS) ×3 IMPLANT
PADDING CAST COTTON 6X4 STRL (CAST SUPPLIES) ×3 IMPLANT
SET ARTHROSCOPY INST (INSTRUMENTS) ×3 IMPLANT
SET BASIN LINEN APH (SET/KITS/TRAYS/PACK) ×3 IMPLANT
STRIP CLOSURE SKIN 1/2X4 (GAUZE/BANDAGES/DRESSINGS) ×2 IMPLANT
SUT ETHILON 3 0 FSL (SUTURE) ×2 IMPLANT
SYR 10ML LL (SYRINGE) ×3 IMPLANT
SYR 30ML LL (SYRINGE) ×3 IMPLANT
YANKAUER SUCT BULB TIP 10FT TU (MISCELLANEOUS) ×9 IMPLANT

## 2019-03-16 NOTE — Anesthesia Preprocedure Evaluation (Signed)
Anesthesia Evaluation    Airway Mallampati: II       Dental  (+) Poor Dentition   Pulmonary sleep apnea , former smoker,    breath sounds clear to auscultation       Cardiovascular hypertension, + CAD   Rhythm:regular     Neuro/Psych Seizures -,  PSYCHIATRIC DISORDERS Anxiety Depression Bipolar Disorder    GI/Hepatic GERD  ,  Endo/Other    Renal/GU      Musculoskeletal   Abdominal   Peds  Hematology   Anesthesia Other Findings Disabled; Bipolar DO and Chronic LBP Daily MJ use States OSA largely resolved after UPP sx Reports NPO p MN  Reproductive/Obstetrics                             Anesthesia Physical Anesthesia Plan  ASA: III  Anesthesia Plan: General   Post-op Pain Management:    Induction:   PONV Risk Score and Plan:   Airway Management Planned:   Additional Equipment:   Intra-op Plan:   Post-operative Plan:   Informed Consent: I have reviewed the patients History and Physical, chart, labs and discussed the procedure including the risks, benefits and alternatives for the proposed anesthesia with the patient or authorized representative who has indicated his/her understanding and acceptance.       Plan Discussed with: Anesthesiologist  Anesthesia Plan Comments:         Anesthesia Quick Evaluation

## 2019-03-16 NOTE — Anesthesia Postprocedure Evaluation (Signed)
Anesthesia Post Note  Patient: Jermaine [name]  Procedure(s) Performed: KNEE ARTHROSCOPY WITH LATERAL  MENISECTOMY (Right Knee)  Patient location during evaluation: PACU Anesthesia Type: General Level of consciousness: awake and alert and oriented Pain management: pain level controlled Vital Signs Assessment: post-procedure vital signs reviewed and stable Respiratory status: spontaneous breathing Cardiovascular status: blood pressure returned to baseline and stable Postop Assessment: no apparent nausea or vomiting Anesthetic complications: no     Last Vitals:  Vitals:   03/16/19 1304 03/16/19 1314  BP: (!) 138/95 (!) 155/94  Pulse: 64 68  Resp: 10 16  Temp:  36.9 C  SpO2: 94% 99%    Last Pain:  Vitals:   03/16/19 1314  TempSrc: Oral  PainSc: 7                  Kyndall Amero

## 2019-03-16 NOTE — Op Note (Signed)
03/16/2019  12:24 PM  PATIENT:  Jermaine [name]  38 y.o. male  PRE-OPERATIVE DIAGNOSIS:  right knee lateral meniscus tear  POST-OPERATIVE DIAGNOSIS: Torn lateral meniscus right knee  PROCEDURE:  Procedure(s): KNEE ARTHROSCOPY WITH LATERAL  MENISECTOMY (Right) 29881  Operative findings the medial compartment was normal the ACL had good tension on probing no instability under anesthesia collateral ligaments were stable under anesthesia patellofemoral joint was normal  Lateral compartment large bucket-handle tear lateral meniscus with extensive tearing of the displaced buckle fragment in the white white zone with unstable posterior horn after meniscectomy and large chondral lesion grade 3 lateral femoral condyle  Meniscus not repairable  SURGEON:  Surgeon(s) and Role:    Carole Civil, MD - Primary  Knee arthroscopy dictation  The patient was identified in the preoperative holding area using 2 approved identification mechanisms. The chart was reviewed and updated. The surgical site was confirmed as right knee and marked with an indelible marker.  The patient was taken to the operating room for anesthesia. After successful general anesthesia, Ancef right knee was used as IV antibiotics.  The patient was placed in the supine position with the (right knee) the operative extremity in an arthroscopic leg holder and the opposite extremity in a padded leg holder.  The timeout was executed.  A lateral portal was established with an 11 blade and the scope was introduced into the joint. A diagnostic arthroscopy was performed in circumferential manner examining the entire knee joint. A medial portal was established and the diagnostic arthroscopy was repeated using a probe to palpate intra-articular structures as they were encountered.   The operative findings are  Operative findings the medial compartment was normal the ACL had good tension on probing no instability under anesthesia  collateral ligaments were stable under anesthesia patellofemoral joint was normal  Lateral compartment large bucket-handle tear lateral meniscus with extensive tearing of the displaced buckle fragment in the white white zone with unstable posterior horn after meniscectomy and large chondral lesion grade 3 lateral femoral condyle  Meniscus not repairable   The lateral meniscus was evaluated and reduced.  Although the posterior horn reduced anterior horn was actually twisted on itself.  The entire displaced fragment was fragmented with multiple linear tears in it.  It was not a repairable meniscus based on its configuration degeneration and zone of tearing  I resected the bucket-handle tear with a straight biter and then used the shaver to remove the bucket-handle fragment I then balanced the meniscus with an ArthroCare wand.  I tried to save a portion of the posterior horn of the lateral meniscus and I was able to save a small rim in front of the popliteus.  The meniscal resection went all the way to the body of the meniscus.  There was a small portion of the lateral meniscus anterior horn that had to be resected  The arthroscopic pump was placed on the wash mode and any excess debris was removed from the joint using suction.  60 cc of Marcaine with epinephrine was injected through the arthroscope.  The portals were closed with 3-0 nylon suture.  A sterile bandage, Ace wrap and Cryo/Cuff was placed and the Cryo/Cuff was activated. The patient was taken to the recovery room in stable condition.   PHYSICIAN ASSISTANT:   ASSISTANTS: none   ANESTHESIA:   General  EBL:  none   BLOOD ADMINISTERED:none  DRAINS: none   LOCAL MEDICATIONS USED:  MARCAINE     SPECIMEN:  No Specimen  DISPOSITION OF SPECIMEN:  N/A  COUNTS:  YES  TOURNIQUET:   See anesthesia record   DICTATION: .Dragon Dictation  PLAN OF CARE: Discharge to home after PACU  PATIENT DISPOSITION:  PACU - hemodynamically  stable.   Delay start of Pharmacological VTE agent (>24hrs) due to surgical blood loss or risk of bleeding: not applicable

## 2019-03-16 NOTE — Transfer of Care (Signed)
Immediate Anesthesia Transfer of Care Note  Patient: Jermaine [name]  Procedure(s) Performed: KNEE ARTHROSCOPY WITH LATERAL  MENISECTOMY (Right Knee)  Patient Location: PACU  Anesthesia Type:General  Level of Consciousness: awake  Airway & Oxygen Therapy: Patient Spontanous Breathing  Post-op Assessment: Report given to RN  Post vital signs: Reviewed and stable  Last Vitals:  Vitals Value Taken Time  BP 141/98 03/16/19 1222  Temp    Pulse 108 03/16/19 1223  Resp 14 03/16/19 1224  SpO2 86 % 03/16/19 1223  Vitals shown include unvalidated device data.  Last Pain:  Vitals:   03/16/19 0926  TempSrc: Oral  PainSc: 5          Complications: No apparent anesthesia complications

## 2019-03-16 NOTE — Interval H&P Note (Signed)
History and Physical Interval Note:  03/16/2019 9:42 AM  Jermaine [name]  has presented today for surgery, with the diagnosis of right knee lateral meniscus tear.  The various methods of treatment have been discussed with the patient and family. After consideration of risks, benefits and other options for treatment, the patient has consented to  Procedure(s): KNEE ARTHROSCOPY WITH MENISCAL REPAIR lateral (Right) as a surgical intervention.  The patient's history has been reviewed, patient examined, no change in status, stable for surgery.  I have reviewed the patient's chart and labs.  Questions were answered to the patient's satisfaction.     Arther Abbott

## 2019-03-16 NOTE — Anesthesia Procedure Notes (Signed)
Procedure Name: LMA Insertion Date/Time: 03/16/2019 11:02 AM Performed by: Ollen Bowl, CRNA Pre-anesthesia Checklist: Patient identified, Patient being monitored, Emergency Drugs available, Timeout performed and Suction available Patient Re-evaluated:Patient Re-evaluated prior to induction Oxygen Delivery Method: Circle System Utilized Preoxygenation: Pre-oxygenation with 100% oxygen Induction Type: IV induction Ventilation: Mask ventilation without difficulty LMA: LMA inserted LMA Size: 4.0 Number of attempts: 1 Placement Confirmation: positive ETCO2 and breath sounds checked- equal and bilateral

## 2019-03-16 NOTE — Brief Op Note (Signed)
03/16/2019  12:24 PM  PATIENT:  Jermaine [name]  38 y.o. male  PRE-OPERATIVE DIAGNOSIS:  right knee lateral meniscus tear  POST-OPERATIVE DIAGNOSIS: Torn lateral meniscus right knee  PROCEDURE:  Procedure(s): KNEE ARTHROSCOPY WITH LATERAL  MENISECTOMY (Right) 29881  Operative findings the medial compartment was normal the ACL had good tension on probing no instability under anesthesia collateral ligaments were stable under anesthesia patellofemoral joint was normal  Lateral compartment large bucket-handle tear lateral meniscus with extensive tearing of the displaced buckle fragment in the white white zone with unstable posterior horn after meniscectomy and large chondral lesion grade 3 lateral femoral condyle  Meniscus not repairable  SURGEON:  Surgeon(s) and Role:    Carole Civil, MD - Primary  Knee arthroscopy dictation  The patient was identified in the preoperative holding area using 2 approved identification mechanisms. The chart was reviewed and updated. The surgical site was confirmed as right knee and marked with an indelible marker.  The patient was taken to the operating room for anesthesia. After successful general anesthesia, Ancef right knee was used as IV antibiotics.  The patient was placed in the supine position with the (right knee) the operative extremity in an arthroscopic leg holder and the opposite extremity in a padded leg holder.  The timeout was executed.  A lateral portal was established with an 11 blade and the scope was introduced into the joint. A diagnostic arthroscopy was performed in circumferential manner examining the entire knee joint. A medial portal was established and the diagnostic arthroscopy was repeated using a probe to palpate intra-articular structures as they were encountered.   The operative findings are  Operative findings the medial compartment was normal the ACL had good tension on probing no instability under anesthesia  collateral ligaments were stable under anesthesia patellofemoral joint was normal  Lateral compartment large bucket-handle tear lateral meniscus with extensive tearing of the displaced buckle fragment in the white white zone with unstable posterior horn after meniscectomy and large chondral lesion grade 3 lateral femoral condyle  Meniscus not repairable   The lateral meniscus was evaluated and reduced.  Although the posterior horn reduced anterior horn was actually twisted on itself.  The entire displaced fragment was fragmented with multiple linear tears in it.  It was not a repairable meniscus based on its configuration degeneration and zone of tearing  I resected the bucket-handle tear with a straight biter and then used the shaver to remove the bucket-handle fragment I then balanced the meniscus with an ArthroCare wand.  I tried to save a portion of the posterior horn of the lateral meniscus and I was able to save a small rim in front of the popliteus.  The meniscal resection went all the way to the body of the meniscus.  There was a small portion of the lateral meniscus anterior horn that had to be resected  The arthroscopic pump was placed on the wash mode and any excess debris was removed from the joint using suction.  60 cc of Marcaine with epinephrine was injected through the arthroscope.  The portals were closed with 3-0 nylon suture.  A sterile bandage, Ace wrap and Cryo/Cuff was placed and the Cryo/Cuff was activated. The patient was taken to the recovery room in stable condition.   PHYSICIAN ASSISTANT:   ASSISTANTS: none   ANESTHESIA:   General  EBL:  none   BLOOD ADMINISTERED:none  DRAINS: none   LOCAL MEDICATIONS USED:  MARCAINE     SPECIMEN:  No Specimen  DISPOSITION OF SPECIMEN:  N/A  COUNTS:  YES  TOURNIQUET:   See anesthesia record   DICTATION: .Dragon Dictation  PLAN OF CARE: Discharge to home after PACU  PATIENT DISPOSITION:  PACU - hemodynamically  stable.   Delay start of Pharmacological VTE agent (>24hrs) due to surgical blood loss or risk of bleeding: not applicable

## 2019-03-16 NOTE — Discharge Instructions (Signed)
General Anesthesia, Adult, Care After  This sheet gives you information about how to care for yourself after your procedure. Your health care provider may also give you more specific instructions. If you have problems or questions, contact your health care provider.  What can I expect after the procedure?  After the procedure, the following side effects are common:  Pain or discomfort at the IV site.  Nausea.  Vomiting.  Sore throat.  Trouble concentrating.  Feeling cold or chills.  Weak or tired.  Sleepiness and fatigue.  Soreness and body aches. These side effects can affect parts of the body that were not involved in surgery.  Follow these instructions at home:    For at least 24 hours after the procedure:  Have a responsible adult stay with you. It is important to have someone help care for you until you are awake and alert.  Rest as needed.  Do not:  Participate in activities in which you could fall or become injured.  Drive.  Use heavy machinery.  Drink alcohol.  Take sleeping pills or medicines that cause drowsiness.  Make important decisions or sign legal documents.  Take care of children on your own.  Eating and drinking  Follow any instructions from your health care provider about eating or drinking restrictions.  When you feel hungry, start by eating small amounts of foods that are soft and easy to digest (bland), such as toast. Gradually return to your regular diet.  Drink enough fluid to keep your urine pale yellow.  If you vomit, rehydrate by drinking water, juice, or clear broth.  General instructions  If you have sleep apnea, surgery and certain medicines can increase your risk for breathing problems. Follow instructions from your health care provider about wearing your sleep device:  Anytime you are sleeping, including during daytime naps.  While taking prescription pain medicines, sleeping medicines, or medicines that make you drowsy.  Return to your normal activities as told by your health care  provider. Ask your health care provider what activities are safe for you.  Take over-the-counter and prescription medicines only as told by your health care provider.  If you smoke, do not smoke without supervision.  Keep all follow-up visits as told by your health care provider. This is important.  Contact a health care provider if:  You have nausea or vomiting that does not get better with medicine.  You cannot eat or drink without vomiting.  You have pain that does not get better with medicine.  You are unable to pass urine.  You develop a skin rash.  You have a fever.  You have redness around your IV site that gets worse.  Get help right away if:  You have difficulty breathing.  You have chest pain.  You have blood in your urine or stool, or you vomit blood.  Summary  After the procedure, it is common to have a sore throat or nausea. It is also common to feel tired.  Have a responsible adult stay with you for the first 24 hours after general anesthesia. It is important to have someone help care for you until you are awake and alert.  When you feel hungry, start by eating small amounts of foods that are soft and easy to digest (bland), such as toast. Gradually return to your regular diet.  Drink enough fluid to keep your urine pale yellow.  Return to your normal activities as told by your health care provider. Ask your health care   provider what activities are safe for you.  This information is not intended to replace advice given to you by your health care provider. Make sure you discuss any questions you have with your health care provider.  Document Released: 12/14/2000 Document Revised: 04/23/2017 Document Reviewed: 04/23/2017  Elsevier Interactive Patient Education  2019 [employer] Inc[employer]

## 2019-03-17 ENCOUNTER — Encounter (HOSPITAL_COMMUNITY): Payer: Self-pay | Admitting: Orthopedic Surgery

## 2019-03-21 DIAGNOSIS — Z9889 Other specified postprocedural states: Secondary | ICD-10-CM | POA: Insufficient documentation

## 2019-03-22 ENCOUNTER — Ambulatory Visit (INDEPENDENT_AMBULATORY_CARE_PROVIDER_SITE_OTHER): Payer: Medicaid Other | Admitting: Orthopedic Surgery

## 2019-03-22 ENCOUNTER — Other Ambulatory Visit: Payer: Self-pay

## 2019-03-22 ENCOUNTER — Encounter: Payer: Self-pay | Admitting: Orthopedic Surgery

## 2019-03-22 ENCOUNTER — Ambulatory Visit (INDEPENDENT_AMBULATORY_CARE_PROVIDER_SITE_OTHER): Payer: Medicaid Other

## 2019-03-22 DIAGNOSIS — S62346A Nondisplaced fracture of base of fifth metacarpal bone, right hand, initial encounter for closed fracture: Secondary | ICD-10-CM

## 2019-03-22 DIAGNOSIS — Z9889 Other specified postprocedural states: Secondary | ICD-10-CM

## 2019-03-22 DIAGNOSIS — M79641 Pain in right hand: Secondary | ICD-10-CM

## 2019-03-22 DIAGNOSIS — S62364A Nondisplaced fracture of neck of fourth metacarpal bone, right hand, initial encounter for closed fracture: Secondary | ICD-10-CM

## 2019-03-22 MED ORDER — OXYCODONE-ACETAMINOPHEN 7.5-325 MG PO TABS: 1.0000 | ORAL_TABLET | ORAL | 0 refills | Status: DC | PRN

## 2019-03-22 NOTE — Progress Notes (Addendum)
Patient ID: Jermaine [name], male   DOB: Sep 14, 1981, 38 y.o.   MRN: [id number]  Chief Complaint  Patient presents with  . Routine Post Op    03/16/19 right knee arthroscopy  c/o pain   . Hand Pain    hit floor with right hand bruising and swelling     The patient is status post knee arthroscopy as described. They're doing well without any major complaints. Pain is well controlled. Incisions are clean, we removed the sutures  The patient will start physical therapy  Follow-up 3 weeks  Operative report  Patient had a bucket-handle meniscal tear with the was not repairable.  Rest of the knee was normal  However, he has fallen since his surgery less than a week ago complains of severe pain right knee with swelling and he also punched the floor and injured his right hand  He can start his exercise program at home increase his icing  Placed a cast on his right hand for his fourth and fifth metacarpal fractures  Chief complaint pain right hand for several days  38 year old male status post arthroscopy right knee partial lateral meniscectomy fell punched the floor complains of pain and swelling of his right hand  Complained of some calf tightness  His right hand is swollen he is tender at the fourth metacarpal head fifth metacarpal base No rotational deformity is seen Decreased range of motion Weakness in grip secondary to pain Neurovascular exam is intact Skin is normal  X-ray shows a fourth metacarpal head fracture and and 1/5 metacarpal fracture at the base of the Y-shaped fracture inverted Y   Short arm cast  Meds ordered this encounter  Medications  . oxyCODONE-acetaminophen (PERCOCET) 7.5-325 MG tablet    Sig: Take 1 tablet by mouth every 4 (four) hours as needed for severe pain.    Dispense:  42 tablet    Refill:  0

## 2019-03-22 NOTE — Addendum Note (Signed)
Addended by: Carole Civil on: 03/22/2019 04:39 PM   Modules accepted: Orders

## 2019-03-29 ENCOUNTER — Ambulatory Visit: Payer: Medicaid Other | Admitting: Orthopedic Surgery

## 2019-04-06 ENCOUNTER — Other Ambulatory Visit: Payer: Self-pay | Admitting: Urology

## 2019-04-21 ENCOUNTER — Encounter (HOSPITAL_COMMUNITY)
Admission: RE | Admit: 2019-04-21 | Discharge: 2019-04-21 | Disposition: A | Discharge status: Home / Self Care | Payer: Medicaid Other | Source: Ambulatory Visit | Attending: Urology | Admitting: Urology

## 2019-04-21 ENCOUNTER — Other Ambulatory Visit: Payer: Self-pay

## 2019-04-21 ENCOUNTER — Other Ambulatory Visit (HOSPITAL_COMMUNITY)
Admission: RE | Admit: 2019-04-21 | Discharge: 2019-04-21 | Disposition: A | Discharge status: Home / Self Care | Payer: Medicaid Other | Source: Ambulatory Visit | Attending: Urology | Admitting: Urology

## 2019-04-21 DIAGNOSIS — Z01812 Encounter for preprocedural laboratory examination: Secondary | ICD-10-CM | POA: Diagnosis not present

## 2019-04-21 DIAGNOSIS — Z20828 Contact with and (suspected) exposure to other viral communicable diseases: Secondary | ICD-10-CM | POA: Insufficient documentation

## 2019-04-21 LAB — SARS CORONAVIRUS 2 (TAT 6-24 HRS): SARS Coronavirus 2: NEGATIVE

## 2019-04-24 ENCOUNTER — Ambulatory Visit (HOSPITAL_COMMUNITY): Payer: Medicaid Other | Admitting: Anesthesiology

## 2019-04-24 ENCOUNTER — Encounter (HOSPITAL_COMMUNITY)
Admission: RE | Disposition: A | Discharge status: Home / Self Care | Payer: Self-pay | Source: Home / Self Care | Attending: Urology

## 2019-04-24 ENCOUNTER — Encounter (HOSPITAL_COMMUNITY): Payer: Self-pay | Admitting: *Deleted

## 2019-04-24 ENCOUNTER — Ambulatory Visit (HOSPITAL_COMMUNITY)
Admission: RE | Admit: 2019-04-24 | Discharge: 2019-04-24 | Disposition: A | Discharge status: Home / Self Care | Payer: Medicaid Other | Attending: Urology | Admitting: Urology

## 2019-04-24 ENCOUNTER — Other Ambulatory Visit: Payer: Self-pay

## 2019-04-24 DIAGNOSIS — I10 Essential (primary) hypertension: Secondary | ICD-10-CM | POA: Insufficient documentation

## 2019-04-24 DIAGNOSIS — I2510 Atherosclerotic heart disease of native coronary artery without angina pectoris: Secondary | ICD-10-CM | POA: Diagnosis not present

## 2019-04-24 DIAGNOSIS — G4730 Sleep apnea, unspecified: Secondary | ICD-10-CM | POA: Diagnosis not present

## 2019-04-24 DIAGNOSIS — N430 Encysted hydrocele: Secondary | ICD-10-CM

## 2019-04-24 DIAGNOSIS — N5089 Other specified disorders of the male genital organs: Secondary | ICD-10-CM | POA: Diagnosis not present

## 2019-04-24 DIAGNOSIS — Z9889 Other specified postprocedural states: Secondary | ICD-10-CM

## 2019-04-24 DIAGNOSIS — S62364A Nondisplaced fracture of neck of fourth metacarpal bone, right hand, initial encounter for closed fracture: Secondary | ICD-10-CM

## 2019-04-24 DIAGNOSIS — N433 Hydrocele, unspecified: Secondary | ICD-10-CM | POA: Insufficient documentation

## 2019-04-24 DIAGNOSIS — M79641 Pain in right hand: Secondary | ICD-10-CM

## 2019-04-24 DIAGNOSIS — Z87891 Personal history of nicotine dependence: Secondary | ICD-10-CM | POA: Diagnosis not present

## 2019-04-24 DIAGNOSIS — S62346A Nondisplaced fracture of base of fifth metacarpal bone, right hand, initial encounter for closed fracture: Secondary | ICD-10-CM

## 2019-04-24 HISTORY — PX: HYDROCELE EXCISION: SHX482

## 2019-04-24 HISTORY — DX: Unspecified convulsions: R56.9

## 2019-04-24 SURGERY — HYDROCELECTOMY
Anesthesia: General | Laterality: Left

## 2019-04-24 MED ORDER — OXYCODONE HCL 5 MG PO TABS
ORAL_TABLET | ORAL | Status: AC
  Filled 2019-04-24: qty 1

## 2019-04-24 MED ORDER — MIDAZOLAM HCL 2 MG/2ML IJ SOLN
INTRAMUSCULAR | Status: AC
  Filled 2019-04-24: qty 2

## 2019-04-24 MED ORDER — 0.9 % SODIUM CHLORIDE (POUR BTL) OPTIME
TOPICAL | Status: DC | PRN
  Administered 2019-04-24: 10:00:00 1000 mL

## 2019-04-24 MED ORDER — FENTANYL CITRATE (PF) 250 MCG/5ML IJ SOLN
INTRAMUSCULAR | Status: AC
  Filled 2019-04-24: qty 5

## 2019-04-24 MED ORDER — MIDAZOLAM HCL 5 MG/5ML IJ SOLN
INTRAMUSCULAR | Status: DC | PRN
  Administered 2019-04-24: 2 mg via INTRAVENOUS

## 2019-04-24 MED ORDER — HYDROMORPHONE HCL 1 MG/ML IJ SOLN: 0.2500 mg | INTRAMUSCULAR | Status: DC | PRN

## 2019-04-24 MED ORDER — GLYCOPYRROLATE 0.2 MG/ML IJ SOLN
INTRAMUSCULAR | Status: DC | PRN
  Administered 2019-04-24: 0.2 mg via INTRAVENOUS

## 2019-04-24 MED ORDER — CEFAZOLIN SODIUM-DEXTROSE 2-4 GM/100ML-% IV SOLN
2.0000 g | INTRAVENOUS | Status: AC
  Administered 2019-04-24: 10:00:00 2 g via INTRAVENOUS

## 2019-04-24 MED ORDER — PROPOFOL 10 MG/ML IV BOLUS
INTRAVENOUS | Status: DC | PRN
  Administered 2019-04-24: 200 mg via INTRAVENOUS

## 2019-04-24 MED ORDER — LIDOCAINE HCL (CARDIAC) PF 50 MG/5ML IV SOSY
PREFILLED_SYRINGE | INTRAVENOUS | Status: DC | PRN
  Administered 2019-04-24: 50 mg via INTRAVENOUS

## 2019-04-24 MED ORDER — OXYCODONE HCL 5 MG PO TABS
5.0000 mg | ORAL_TABLET | Freq: Once | ORAL | Status: AC
  Administered 2019-04-24: 12:00:00 5 mg via ORAL

## 2019-04-24 MED ORDER — FENTANYL CITRATE (PF) 100 MCG/2ML IJ SOLN
INTRAMUSCULAR | Status: DC | PRN
  Administered 2019-04-24 (×5): 50 ug via INTRAVENOUS

## 2019-04-24 MED ORDER — OXYCODONE-ACETAMINOPHEN 7.5-325 MG PO TABS: 1.0000 | ORAL_TABLET | ORAL | 0 refills | Status: DC | PRN

## 2019-04-24 MED ORDER — BUPIVACAINE HCL (PF) 0.25 % IJ SOLN
INTRAMUSCULAR | Status: DC | PRN
  Administered 2019-04-24: 10 mL

## 2019-04-24 MED ORDER — PROPOFOL 10 MG/ML IV BOLUS
INTRAVENOUS | Status: AC
  Filled 2019-04-24: qty 20

## 2019-04-24 MED ORDER — LACTATED RINGERS IV SOLN: Freq: Once | INTRAVENOUS | Status: DC

## 2019-04-24 MED ORDER — CEFAZOLIN SODIUM-DEXTROSE 2-4 GM/100ML-% IV SOLN
INTRAVENOUS | Status: AC
  Filled 2019-04-24: qty 100

## 2019-04-24 MED ORDER — BUPIVACAINE HCL (PF) 0.25 % IJ SOLN
INTRAMUSCULAR | Status: AC
  Filled 2019-04-24: qty 30

## 2019-04-24 MED ORDER — LACTATED RINGERS IV SOLN
INTRAVENOUS | Status: DC | PRN
  Administered 2019-04-24: 09:00:00 via INTRAVENOUS

## 2019-04-24 MED ORDER — SEVOFLURANE IN SOLN
RESPIRATORY_TRACT | Status: AC
  Filled 2019-04-24: qty 250

## 2019-04-24 SURGICAL SUPPLY — 32 items
ADH SKN CLS APL DERMABOND .7 (GAUZE/BANDAGES/DRESSINGS) ×1
BLADE SURG 15 STRL LF DISP TIS (BLADE) ×1 IMPLANT
BLADE SURG 15 STRL SS (BLADE) ×3
COVER LIGHT HANDLE STERIS (MISCELLANEOUS) ×6 IMPLANT
COVER WAND RF STERILE (DRAPES) ×2 IMPLANT
DECANTER SPIKE VIAL GLASS SM (MISCELLANEOUS) ×2 IMPLANT
DERMABOND ADVANCED (GAUZE/BANDAGES/DRESSINGS) ×2
DERMABOND ADVANCED .7 DNX12 (GAUZE/BANDAGES/DRESSINGS) ×1 IMPLANT
ELECT REM PT RETURN 9FT ADLT (ELECTROSURGICAL) ×3
ELECTRODE REM PT RTRN 9FT ADLT (ELECTROSURGICAL) ×1 IMPLANT
GAUZE SPONGE 4X4 12PLY STRL (GAUZE/BANDAGES/DRESSINGS) ×6 IMPLANT
GLOVE BIO SURGEON STRL SZ7 (GLOVE) ×2 IMPLANT
GLOVE BIO SURGEON STRL SZ8 (GLOVE) ×3 IMPLANT
GLOVE BIOGEL PI IND STRL 7.0 (GLOVE) ×2 IMPLANT
GLOVE BIOGEL PI INDICATOR 7.0 (GLOVE) ×4
GOWN STRL REUS W/TWL LRG LVL3 (GOWN DISPOSABLE) ×2 IMPLANT
GOWN STRL REUS W/TWL XL LVL3 (GOWN DISPOSABLE) ×2 IMPLANT
KIT TURNOVER KIT A (KITS) ×3 IMPLANT
MANIFOLD NEPTUNE II (INSTRUMENTS) ×3 IMPLANT
NDL HYPO 25X1 1.5 SAFETY (NEEDLE) ×1 IMPLANT
NEEDLE HYPO 25X1 1.5 SAFETY (NEEDLE) ×3 IMPLANT
NS IRRIG 1000ML POUR BTL (IV SOLUTION) ×3 IMPLANT
PACK MINOR (CUSTOM PROCEDURE TRAY) ×3 IMPLANT
PAD ARMBOARD 7.5X6 YLW CONV (MISCELLANEOUS) ×3 IMPLANT
SET BASIN LINEN APH (SET/KITS/TRAYS/PACK) ×3 IMPLANT
SUPPORT SCROTAL LG STRP (MISCELLANEOUS) ×2 IMPLANT
SUPPORTER ATHLETIC LG (MISCELLANEOUS) ×1
SUT MNCRL AB 4-0 PS2 18 (SUTURE) ×3 IMPLANT
SUT VIC AB 3-0 SH 27 (SUTURE) ×3
SUT VIC AB 3-0 SH 27X BRD (SUTURE) ×1 IMPLANT
SYR CONTROL 10ML LL (SYRINGE) ×3 IMPLANT
YANKAUER SUCT 12FT TUBE ARGYLE (SUCTIONS) ×3 IMPLANT

## 2019-04-24 NOTE — Anesthesia Preprocedure Evaluation (Signed)
Anesthesia Evaluation    Airway Mallampati: III  TM Distance: >3 FB     Dental  (+) Missing, Chipped   Pulmonary sleep apnea ,    breath sounds clear to auscultation       Cardiovascular Exercise Tolerance: Good METS: 3 - Mets hypertension, Pt. on medications + CAD   Rhythm:Regular Rate:Normal     Neuro/Psych PSYCHIATRIC DISORDERS Anxiety Depression Bipolar Disorder    GI/Hepatic GERD  ,  Endo/Other    Renal/GU      Musculoskeletal   Abdominal   Peds  Hematology   Anesthesia Other Findings   Reproductive/Obstetrics                             Anesthesia Physical Anesthesia Plan  ASA: III  Anesthesia Plan: General   Post-op Pain Management:    Induction: Intravenous  PONV Risk Score and Plan: 0  Airway Management Planned: LMA  Additional Equipment:   Intra-op Plan:   Post-operative Plan:   Informed Consent: I have reviewed the patients History and Physical, chart, labs and discussed the procedure including the risks, benefits and alternatives for the proposed anesthesia with the patient or authorized representative who has indicated his/her understanding and acceptance.       Plan Discussed with:   Anesthesia Plan Comments:         Anesthesia Quick Evaluation

## 2019-04-24 NOTE — Progress Notes (Signed)
Patient "ready to go", "I will be able to get my pain prescription filled after 5 pm today".

## 2019-04-24 NOTE — Progress Notes (Signed)
RN made Dr. Charna Elizabeth aware of pt's BP readings. Gave the permission to d/c to home. Pt stated he will take BP meds when he gets home.
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
## 2019-04-24 NOTE — Op Note (Signed)
Preoperative diagnosis: Left hydrocele, left scrotal lesions  Postoperative diagnosis: same  Procedure: 1.Left hydrocelectomy 2. Excision of left scrotal lesion  Attending: Nicolette Bang, MD  Anesthesia: General  History of blood loss: Minimal  Antibiotics: ancef  Drains: none  Specimens: 1. Left scrotal scar and lesion  Findings: small hydrocele. 4 square centimeter scrotal skin lesion at prior hydrocele incision  Indications: Patient is a 38 year old male with a history of left hydrocele and left scrotal lesion that was causing pain.  We discussed the treatment options including observation versus excision after discussing treatment options he decided to proceed with excision.   Procedure in detail: Prior to procedure consent was obtained.  Patient was brought to the operating room and a brief timeout was done to ensure correct patient, correct procedure, correct site.  General anesthesia was administered and patient was placed in supine position.  His genitalia was then prepped and draped in usual sterile fashion.  A 4 cm elliptical incision was made in the left hemiscrotum around the lesion and the previous hydrocelectomy scar. We then excised the scar and lesion. We dissected down to the tunica and then incised the tunica. A small hydrocele was encountered. We then excised the hydrocele sac and then over sewed the edge with 2-0 Vicryl in a running fashion. We then returned the testis to the left hemiscrotum and closed the overlying dartos with 3-0 vicryl in a running fashion. The skin was then closed with 4-0 monocryl in a running fashion. Dermabond was placed on the incision.  A dressing was then applied to the incision.  We then placed a scrotal fluff and this then concluded the procedure which was well tolerated by the patient.  Complications: None  Condition: Stable, extubated, transferred to PACU.  Plan: Patient is to be discharged home.  He is to follow up in 2 weeks for  wound check.

## 2019-04-24 NOTE — Discharge Instructions (Signed)
Hydrocele, Adult °A hydrocele is a collection of fluid in the loose pouch of skin that holds the testicles (scrotum). This may happen because: °· The amount of fluid produced in the scrotum is not absorbed by the rest of the body. °· Fluid from the abdomen fills the scrotum. Normally, the testicles develop in the abdomen then move (drop) into to the scrotum before birth. The tube that the testicles travel through usually closes after the testicles drop. If the tube does not close, fluid from the abdomen can fill the scrotum. This is less common in adults. °What are the causes? °The cause of a hydrocele in adults is usually not known. However, it may be caused by: °· An injury to the scrotum. °· An infection (epididymitis). °· Decreased blood flow to the scrotum. °· Twisting of a testicle (testicular torsion). °· A birth defect. °· A tumor or cancer of the testicle. °What are the signs or symptoms? °A hydrocele feels like a water-filled balloon. It may also feel heavy. Other symptoms include: °· Swelling of the scrotum. The swelling may decrease when you lie down. You may also notice more swelling at night than in the morning. °· Swelling of the groin. °· Mild discomfort in the scrotum. °· Pain. This can develop if the hydrocele was caused by infection or twisting. The larger the hydrocele, the more likely you are to have pain. °How is this diagnosed? °This condition may be diagnosed based on: °· Physical exam. °· Medical history. °You may also have other tests, including: °· Imaging tests, such as ultrasound. °· Blood or urine tests. °How is this treated? °Most hydroceles go away on their own. If you have no discomfort or pain, your health care provider may suggest close monitoring of your condition (called watch and wait or watchful waiting) until the condition goes away or symptoms develop. If treatment is needed, it may include: °· Treating an underlying condition. This may include using an antibiotic medicine to  treat an infection. °· Surgery to stop fluid from collecting in the scrotum. °· Surgery to drain the fluid. Options include: °? Needle aspiration. A needle is used to drain fluid. However, the fluid buildup will come back quickly. °? Hydrocelectomy. For this procedure, an incision is made in the scrotum to remove the fluid sac. °Follow these instructions at home: °· Watch the hydrocele for any changes. °· Take over-the-counter and prescription medicines only as told by your health care provider. °· If you were prescribed an antibiotic medicine, use it as told by your health care provider. Do not stop taking the antibiotic even if you start to feel better. °· Keep all follow-up visits as told by your health care provider. This is important. °Contact a health care provider if: °· You notice any changes in the hydrocele. °· The swelling in your scrotum or groin gets worse. °· The hydrocele becomes red, firm, painful, or tender to the touch. °· You have a fever. °Get help right away if you: °· Develop a lot of pain, or your pain becomes worse. °Summary °· A hydrocele is a collection of fluid in the loose pouch of skin that holds the testicles (scrotum). °· Hydroceles can cause swelling, discomfort, and sometimes pain. °· In adults, the cause of a hydrocele usually is not known. However, it is sometimes caused by an infection or a rotation and twisting of the scrotum. °· Treatment is usually not needed. Hydroceles often go away on their own. If a hydrocele causes pain, treatment   may be given to ease the pain. This information is not intended to replace advice given to you by your health care provider. Make sure you discuss any questions you have with your health care provider. Document Released: 02/25/2010 Document Revised: 09/18/2017 Document Reviewed: 09/18/2017 Elsevier Patient Education  2020 [employer] Anesthesia, Adult, Care After This sheet gives you information about how to care for  yourself after your procedure. Your health care provider may also give you more specific instructions. If you have problems or questions, contact your health care provider. What can I expect after the procedure? After the procedure, the following side effects are common:  Pain or discomfort at the IV site.  Nausea.  Vomiting.  Sore throat.  Trouble concentrating.  Feeling cold or chills.  Weak or tired.  Sleepiness and fatigue.  Soreness and body aches. These side effects can affect parts of the body that were not involved in surgery. Follow these instructions at home:  For at least 24 hours after the procedure:  Have a responsible adult stay with you. It is important to have someone help care for you until you are awake and alert.  Rest as needed.  Do not: ? Participate in activities in which you could fall or become injured. ? Drive. ? Use heavy machinery. ? Drink alcohol. ? Take sleeping pills or medicines that cause drowsiness. ? Make important decisions or sign legal documents. ? Take care of children on your own. Eating and drinking  Follow any instructions from your health care provider about eating or drinking restrictions.  When you feel hungry, start by eating small amounts of foods that are soft and easy to digest (bland), such as toast. Gradually return to your regular diet.  Drink enough fluid to keep your urine pale yellow.  If you vomit, rehydrate by drinking water, juice, or clear broth. General instructions  If you have sleep apnea, surgery and certain medicines can increase your risk for breathing problems. Follow instructions from your health care provider about wearing your sleep device: ? Anytime you are sleeping, including during daytime naps. ? While taking prescription pain medicines, sleeping medicines, or medicines that make you drowsy.  Return to your normal activities as told by your health care provider. Ask your health care provider  what activities are safe for you.  Take over-the-counter and prescription medicines only as told by your health care provider.  If you smoke, do not smoke without supervision.  Keep all follow-up visits as told by your health care provider. This is important. Contact a health care provider if:  You have nausea or vomiting that does not get better with medicine.  You cannot eat or drink without vomiting.  You have pain that does not get better with medicine.  You are unable to pass urine.  You develop a skin rash.  You have a fever.  You have redness around your IV site that gets worse. Get help right away if:  You have difficulty breathing.  You have chest pain.  You have blood in your urine or stool, or you vomit blood. Summary  After the procedure, it is common to have a sore throat or nausea. It is also common to feel tired.  Have a responsible adult stay with you for the first 24 hours after general anesthesia. It is important to have someone help care for you until you are awake and alert.  When you feel hungry, start by eating small amounts  of foods that are soft and easy to digest (bland), such as toast. Gradually return to your regular diet.  Drink enough fluid to keep your urine pale yellow.  Return to your normal activities as told by your health care provider. Ask your health care provider what activities are safe for you. This information is not intended to replace advice given to you by your health care provider. Make sure you discuss any questions you have with your health care provider. Document Released: 12/14/2000 Document Revised: 09/10/2017 Document Reviewed: 04/23/2017 Elsevier Patient Education  2020 [employer].

## 2019-04-24 NOTE — Anesthesia Postprocedure Evaluation (Signed)
Anesthesia Post Note  Patient: Jermaine [name]  Procedure(s) Performed: HYDROCELECTOMY ADULT (Left )  Patient location during evaluation: PACU Anesthesia Type: General Level of consciousness: awake and alert and oriented Pain management: pain level controlled Vital Signs Assessment: post-procedure vital signs reviewed and stable Respiratory status: spontaneous breathing Cardiovascular status: blood pressure returned to baseline and stable Postop Assessment: no apparent nausea or vomiting and adequate PO intake Anesthetic complications: no     Last Vitals:  Vitals:   04/24/19 1146 04/24/19 1151  BP: (!) 174/102 (!) 163/90  Pulse: 72 68  Resp: 20 18  Temp:  36.4 C  SpO2: 97% 97%    Last Pain:  Vitals:   04/24/19 1151  TempSrc: Oral  PainSc: 6                  Tru Leopard

## 2019-04-24 NOTE — Transfer of Care (Signed)
Immediate Anesthesia Transfer of Care Note  Patient: Jermaine [name]  Procedure(s) Performed: HYDROCELECTOMY ADULT (Left )  Patient Location: PACU  Anesthesia Type:General  Level of Consciousness: awake, alert  and oriented  Airway & Oxygen Therapy: Patient Spontanous Breathing  Post-op Assessment: Report given to RN and Post -op Vital signs reviewed and stable  Post vital signs: Reviewed and stable  Last Vitals:  Vitals Value Taken Time  BP 144/96 04/24/19 1103  Temp    Pulse 83 04/24/19 1105  Resp    SpO2 99 % 04/24/19 1105  Vitals shown include unvalidated device data.  Last Pain:  Vitals:   04/24/19 0821  TempSrc: Oral  PainSc: 5       Patients Stated Pain Goal: 5 (40/97/35 3299)  Complications: No apparent anesthesia complications

## 2019-04-24 NOTE — Anesthesia Procedure Notes (Signed)
Procedure Name: LMA Insertion Date/Time: 04/24/2019 10:16 AM Performed by: Ollen Bowl, CRNA Pre-anesthesia Checklist: Patient identified, Patient being monitored, Emergency Drugs available, Timeout performed and Suction available Patient Re-evaluated:Patient Re-evaluated prior to induction Oxygen Delivery Method: Circle System Utilized Preoxygenation: Pre-oxygenation with 100% oxygen Induction Type: IV induction Ventilation: Mask ventilation without difficulty LMA: LMA inserted LMA Size: 4.0 Number of attempts: 1 Placement Confirmation: positive ETCO2 and breath sounds checked- equal and bilateral

## 2019-04-24 NOTE — Brief Op Note (Signed)
04/24/2019  10:48 AM  PATIENT:  Jermaine [name]  38 y.o. male  PRE-OPERATIVE DIAGNOSIS:  LEFT SCROTAL LESION/LEFT HYDROCELE  POST-OPERATIVE DIAGNOSIS:  LEFT SCROTAL LESION/LEFT HYDROCELE  PROCEDURE:  Procedure(s): HYDROCELECTOMY ADULT (Left)  SURGEON:  Surgeon(s) and Role:    * McKenzie, Candee Furbish, MD - Primary  PHYSICIAN ASSISTANT:   ASSISTANTS: none   ANESTHESIA:   general  EBL:  20 mL   BLOOD ADMINISTERED:none  DRAINS: none   LOCAL MEDICATIONS USED:  BUPIVICAINE   SPECIMEN:  Source of Specimen:  left scrotal wall  DISPOSITION OF SPECIMEN:  PATHOLOGY  COUNTS:  YES  TOURNIQUET:  * No tourniquets in log *  DICTATION: .Note written in EPIC  PLAN OF CARE: Discharge to home after PACU  PATIENT DISPOSITION:  PACU - hemodynamically stable.   Delay start of Pharmacological VTE agent (>24hrs) due to surgical blood loss or risk of bleeding: not applicable

## 2019-04-25 ENCOUNTER — Encounter (HOSPITAL_COMMUNITY): Payer: Self-pay | Admitting: Urology

## 2019-05-03 ENCOUNTER — Ambulatory Visit: Payer: Medicaid Other | Admitting: Urology

## 2019-05-03 ENCOUNTER — Other Ambulatory Visit: Payer: Self-pay

## 2019-05-10 ENCOUNTER — Ambulatory Visit: Payer: Medicaid Other | Admitting: Urology

## 2019-05-15 ENCOUNTER — Emergency Department (HOSPITAL_COMMUNITY)
Admission: EM | Admit: 2019-05-15 | Discharge: 2019-05-15 | Disposition: A | Discharge status: Home / Self Care | Payer: Medicaid Other | Attending: Emergency Medicine | Admitting: Emergency Medicine

## 2019-05-15 ENCOUNTER — Encounter (HOSPITAL_COMMUNITY): Payer: Self-pay | Admitting: Emergency Medicine

## 2019-05-15 ENCOUNTER — Other Ambulatory Visit: Payer: Self-pay

## 2019-05-15 DIAGNOSIS — Z87891 Personal history of nicotine dependence: Secondary | ICD-10-CM | POA: Insufficient documentation

## 2019-05-15 DIAGNOSIS — N50812 Left testicular pain: Secondary | ICD-10-CM | POA: Insufficient documentation

## 2019-05-15 DIAGNOSIS — I10 Essential (primary) hypertension: Secondary | ICD-10-CM | POA: Diagnosis not present

## 2019-05-15 DIAGNOSIS — I2510 Atherosclerotic heart disease of native coronary artery without angina pectoris: Secondary | ICD-10-CM | POA: Insufficient documentation

## 2019-05-15 DIAGNOSIS — Z79899 Other long term (current) drug therapy: Secondary | ICD-10-CM | POA: Diagnosis not present

## 2019-05-15 MED ORDER — OXYCODONE-ACETAMINOPHEN 5-325 MG PO TABS: 1.0000 | ORAL_TABLET | Freq: Four times a day (QID) | ORAL | 0 refills | Status: DC | PRN

## 2019-05-15 MED ORDER — OXYCODONE-ACETAMINOPHEN 5-325 MG PO TABS
1.0000 | ORAL_TABLET | Freq: Once | ORAL | Status: AC
  Administered 2019-05-15: 1 via ORAL
  Filled 2019-05-15: qty 1

## 2019-05-15 NOTE — ED Triage Notes (Signed)
Post-op testicle surgery, 3 weeks ago.  C/o pain to left testicle rating pain 7/10.
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
## 2019-05-15 NOTE — Discharge Instructions (Addendum)
Contact your urologist office no later than tomorrow.  Make arrangements to be seen this week.  When you speak to them you can tell them that the emergency physician spoke with the urologist today and it was decided that you need to be seen this week in the office.  Clean your wound 2-3 times a day gently with soap and water and change her dressing

## 2019-05-24 ENCOUNTER — Ambulatory Visit: Payer: Medicaid Other | Admitting: Urology

## 2019-05-26 ENCOUNTER — Other Ambulatory Visit: Payer: Self-pay | Admitting: Orthopedic Surgery

## 2019-06-07 ENCOUNTER — Ambulatory Visit: Payer: Medicaid Other | Admitting: Urology

## 2019-07-05 ENCOUNTER — Ambulatory Visit (INDEPENDENT_AMBULATORY_CARE_PROVIDER_SITE_OTHER): Payer: Medicaid Other | Admitting: Urology

## 2019-07-05 DIAGNOSIS — N430 Encysted hydrocele: Secondary | ICD-10-CM

## 2019-08-02 ENCOUNTER — Encounter: Payer: Self-pay | Admitting: Cardiovascular Disease

## 2019-08-03 ENCOUNTER — Ambulatory Visit: Payer: Medicaid Other | Admitting: Cardiovascular Disease

## 2019-08-04 ENCOUNTER — Other Ambulatory Visit: Payer: Self-pay

## 2019-08-04 ENCOUNTER — Telehealth: Payer: Self-pay | Admitting: Cardiovascular Disease

## 2019-08-04 ENCOUNTER — Encounter: Payer: Self-pay | Admitting: Cardiovascular Disease

## 2019-08-04 ENCOUNTER — Encounter: Payer: Self-pay | Admitting: *Deleted

## 2019-08-04 ENCOUNTER — Ambulatory Visit: Payer: Medicaid Other | Admitting: Cardiovascular Disease

## 2019-08-04 VITALS — BP 122/82 | HR 88 | Ht 74.0 in | Wt 224.0 lb

## 2019-08-04 DIAGNOSIS — R079 Chest pain, unspecified: Secondary | ICD-10-CM | POA: Diagnosis not present

## 2019-08-04 DIAGNOSIS — R072 Precordial pain: Secondary | ICD-10-CM

## 2019-08-04 DIAGNOSIS — E7800 Pure hypercholesterolemia, unspecified: Secondary | ICD-10-CM

## 2019-08-04 DIAGNOSIS — I10 Essential (primary) hypertension: Secondary | ICD-10-CM

## 2019-08-04 DIAGNOSIS — I429 Cardiomyopathy, unspecified: Secondary | ICD-10-CM | POA: Diagnosis not present

## 2019-08-04 NOTE — Addendum Note (Signed)
Addended by: Merlene Laughter on: 08/04/2019 03:34 PM   Modules accepted: Orders

## 2019-08-04 NOTE — Patient Instructions (Addendum)
Medication Instructions:   Your physician recommends that you continue on your current medications as directed. Please refer to the Current Medication list given to you today.  Labwork:   Your physician recommends that you return for lab work in: 1-2 weeks to check your BMET. This may be done at [facility] or [facility] in [place].  Testing/Procedures: Your physician has requested that you have cardiac CT. Cardiac computed tomography (CT) is a painless test that uses an x-ray machine to take clear, detailed pictures of your heart. For further information please visit [email or web address]. Please follow instruction sheet as given. Your physician has requested that you have an echocardiogram. Echocardiography is a painless test that uses sound waves to create images of your heart. It provides your doctor with information about the size and shape of your heart and how well your heart's chambers and valves are working. This procedure takes approximately one hour. There are no restrictions for this procedure.  Follow-Up:  Your physician recommends that you schedule a follow-up appointment in: 4 months.  Any Other Special Instructions Will Be Listed Below (If Applicable).  If you need a refill on your cardiac medications before your next appointment, please call your pharmacy.  Your cardiac CT will be scheduled at one of the below locations:   [facility] [address] [place], [place] [address] [phone]   If scheduled at [facility], please arrive at the [facility] main entrance of [facility] 30-45 minutes prior to test start time. Proceed to the [facility] Radiology Department (first floor) to check-in and test prep.  Please follow these instructions carefully (unless otherwise directed):  Hold all erectile dysfunction medications at least 3 days (72 hrs) prior to test.  On the Night Before the Test: . Be sure to Drink plenty of water. . Do  not consume any caffeinated/decaffeinated beverages or chocolate 12 hours prior to your test. . Do not take any antihistamines 12 hours prior to your test. (phenergan & hydroxyzine) . If the patient has contrast allergy: ? Patient will need a prescription for Prednisone and very clear instructions (as follows): 1. Prednisone 50 mg - take 13 hours prior to test 2. Take another Prednisone 50 mg 7 hours prior to test 3. Take another Prednisone 50 mg 1 hour prior to test 4. Take Benadryl 50 mg 1 hour prior to test . Patient must complete all four doses of above prophylactic medications. . Patient will need a ride after test due to Benadryl.  On the Day of the Test: . Drink plenty of water. Do not drink any water within one hour of the test. . Do not eat any food 4 hours prior to the test. . You may take your regular medications prior to the test.  . Take atenolol two hours prior to test.       After the Test: . Drink plenty of water. . After receiving IV contrast, you may experience a mild flushed feeling. This is normal. . On occasion, you may experience a mild rash up to 24 hours after the test. This is not dangerous. If this occurs, you can take Benadryl 25 mg and increase your fluid intake. . If you experience trouble breathing, this can be serious. If it is severe call 911 IMMEDIATELY. If it is mild, please call our office. . If you take any of these medications: Glipizide/Metformin, Avandament, Glucavance, please do not take 48 hours after completing test unless otherwise instructed.  Once we have confirmed authorization from your insurance company, we will call you to set up a date and time for your test.   For non-scheduling related questions, please contact the cardiac imaging nurse navigator should you have any questions/concerns: Marchia Bond, RN Navigator Cardiac Imaging [facility] [phone] Office
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
## 2019-08-04 NOTE — Telephone Encounter (Signed)
Pre-cert Verification for the following procedure   ° °Echo scheduled for 08-18-2019 at  Hospital  °

## 2019-08-16 ENCOUNTER — Ambulatory Visit: Payer: Medicaid Other | Admitting: Urology

## 2019-08-18 ENCOUNTER — Ambulatory Visit (HOSPITAL_COMMUNITY): Payer: Medicaid Other

## 2019-09-20 ENCOUNTER — Ambulatory Visit: Payer: Medicaid Other | Admitting: Urology

## 2019-09-27 ENCOUNTER — Encounter: Payer: Self-pay | Admitting: Urology

## 2019-09-27 ENCOUNTER — Ambulatory Visit: Payer: Medicaid Other | Admitting: Urology

## 2019-09-27 ENCOUNTER — Other Ambulatory Visit: Payer: Self-pay

## 2019-09-27 VITALS — BP 137/85 | HR 72 | Temp 96.2°F | Ht 74.0 in | Wt 225.0 lb

## 2019-09-27 DIAGNOSIS — N522 Drug-induced erectile dysfunction: Secondary | ICD-10-CM | POA: Diagnosis not present

## 2019-09-27 DIAGNOSIS — N50812 Left testicular pain: Secondary | ICD-10-CM | POA: Diagnosis not present

## 2019-09-27 MED ORDER — TADALAFIL 20 MG PO TABS: 20.0000 mg | ORAL_TABLET | Freq: Every day | ORAL | 0 refills | Status: DC | PRN

## 2019-09-27 MED ORDER — CLOTRIMAZOLE-BETAMETHASONE 1-0.05 % EX CREA: 1.0000 "application " | TOPICAL_CREAM | Freq: Two times a day (BID) | CUTANEOUS | 1 refills | Status: DC

## 2019-09-27 NOTE — Progress Notes (Signed)
Urological Symptom Review  Patient is experiencing the following symptoms: Penile pain (male only)    Review of Systems  Gastrointestinal (upper)  : Negative for upper GI symptoms  Gastrointestinal (lower) : Negative for lower GI symptoms negative  Constitutional : Negative for symptoms  Skin: Negative for skin symptoms  Eyes: Negative for eye symptoms  Ear/Nose/Throat : Negative for Ear/Nose/Throat symptoms  Hematologic/Lymphatic: Negative for Hematologic/Lymphatic symptoms  Cardiovascular : Negative for cardiovascular symptoms  Respiratory : Negative for respiratory symptoms  Endocrine: none  Musculoskeletal: negative  Neurological: Negative for neurological symptoms  Psychologic: Negative for psychiatric symptoms

## 2019-09-27 NOTE — Patient Instructions (Signed)
Erectile Dysfunction Erectile dysfunction (ED) is the inability to get or keep an erection in order to have sexual intercourse. Erectile dysfunction may include:  Inability to get an erection.  Lack of enough hardness of the erection to allow penetration.  Loss of the erection before sex is finished. What are the causes? This condition may be caused by:  Certain medicines, such as: ? Pain relievers. ? Antihistamines. ? Antidepressants. ? Blood pressure medicines. ? Water pills (diuretics). ? Ulcer medicines. ? Muscle relaxants. ? Drugs.  Excessive drinking.  Psychological causes, such as: ? Anxiety. ? Depression. ? Sadness. ? Exhaustion. ? Performance fear. ? Stress.  Physical causes, such as: ? Artery problems. This may include diabetes, smoking, liver disease, or atherosclerosis. ? High blood pressure. ? Hormonal problems, such as low testosterone. ? Obesity. ? Nerve problems. This may include back or pelvic injuries, diabetes mellitus, multiple sclerosis, or Parkinson disease. What are the signs or symptoms? Symptoms of this condition include:  Inability to get an erection.  Lack of enough hardness of the erection to allow penetration.  Loss of the erection before sex is finished.  Normal erections at some times, but with frequent unsatisfactory episodes.  Low sexual satisfaction in either partner due to erection problems.  A curved penis occurring with erection. The curve may cause pain or the penis may be too curved to allow for intercourse.  Never having nighttime erections. How is this diagnosed? This condition is often diagnosed by:  Performing a physical exam to find other diseases or specific problems with the penis.  Asking you detailed questions about the problem.  Performing blood tests to check for diabetes mellitus or to measure hormone levels.  Performing other tests to check for underlying health conditions.  Performing an ultrasound  exam to check for scarring.  Performing a test to check blood flow to the penis.  Doing a sleep study at home to measure nighttime erections. How is this treated? This condition may be treated by:  Medicine taken by mouth to help you achieve an erection (oral medicine).  Hormone replacement therapy to replace low testosterone levels.  Medicine that is injected into the penis. Your health care provider may instruct you how to give yourself these injections at home.  Vacuum pump. This is a pump with a ring on it. The pump and ring are placed on the penis and used to create pressure that helps the penis become erect.  Penile implant surgery. In this procedure, you may receive: ? An inflatable implant. This consists of cylinders, a pump, and a reservoir. The cylinders can be inflated with a fluid that helps to create an erection, and they can be deflated after intercourse. ? A semi-rigid implant. This consists of two silicone rubber rods. The rods provide some rigidity. They are also flexible, so the penis can both curve downward in its normal position and become straight for sexual intercourse.  Blood vessel surgery, to improve blood flow to the penis. During this procedure, a blood vessel from a different part of the body is placed into the penis to allow blood to flow around (bypass) damaged or blocked blood vessels.  Lifestyle changes, such as exercising more, losing weight, and quitting smoking. Follow these instructions at home: Medicines   Take over-the-counter and prescription medicines only as told by your health care provider. Do not increase the dosage without first discussing it with your health care provider.  If you are using self-injections, perform injections as directed by your   health care provider. Make sure to avoid any veins that are on the surface of the penis. After giving an injection, apply pressure to the injection site for 5 minutes. General  instructions  Exercise regularly, as directed by your health care provider. Work with your health care provider to lose weight, if needed.  Do not use any products that contain nicotine or tobacco, such as cigarettes and e-cigarettes. If you need help quitting, ask your health care provider.  Before using a vacuum pump, read the instructions that come with the pump and discuss any questions with your health care provider.  Keep all follow-up visits as told by your health care provider. This is important. Contact a health care provider if:  You feel nauseous.  You vomit. Get help right away if:  You are taking oral or injectable medicines and you have an erection that lasts longer than 4 hours. If your health care provider is unavailable, go to the nearest emergency room for evaluation. An erection that lasts much longer than 4 hours can result in permanent damage to your penis.  You have severe pain in your groin or abdomen.  You develop redness or severe swelling of your penis.  You have redness spreading up into your groin or lower abdomen.  You are unable to urinate.  You experience chest pain or a rapid heart beat (palpitations) after taking oral medicines. Summary  Erectile dysfunction (ED) is the inability to get or keep an erection during sexual intercourse. This problem can usually be treated successfully.  This condition is diagnosed based on a physical exam, your symptoms, and tests to determine the cause. Treatment varies depending on the cause, and may include medicines, hormone therapy, surgery, or vacuum pump.  You may need follow-up visits to make sure that you are using your medicines or devices correctly.  Get help right away if you are taking or injecting medicines and you have an erection that lasts longer than 4 hours. This information is not intended to replace advice given to you by your health care provider. Make sure you discuss any questions you have with  your health care provider. Document Revised: 08/20/2017 Document Reviewed: 09/23/2016 Elsevier Patient Education  2020 [employer]  
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
## 2019-10-06 ENCOUNTER — Telehealth (HOSPITAL_COMMUNITY): Payer: Self-pay | Admitting: Emergency Medicine

## 2019-10-06 ENCOUNTER — Encounter (HOSPITAL_COMMUNITY): Payer: Self-pay

## 2019-10-06 NOTE — Telephone Encounter (Signed)
automated message saying "this phone has restrictions that prevent your call from being completed"

## 2019-10-09 ENCOUNTER — Ambulatory Visit (HOSPITAL_COMMUNITY): Admission: RE | Admit: 2019-10-09 | Payer: Medicaid Other | Source: Ambulatory Visit

## 2019-10-12 ENCOUNTER — Encounter (HOSPITAL_COMMUNITY): Payer: Self-pay

## 2019-10-12 ENCOUNTER — Emergency Department (HOSPITAL_COMMUNITY)
Admission: EM | Admit: 2019-10-12 | Discharge: 2019-10-12 | Disposition: A | Discharge status: Home / Self Care | Payer: Medicaid Other | Attending: Emergency Medicine | Admitting: Emergency Medicine

## 2019-10-12 ENCOUNTER — Emergency Department (HOSPITAL_COMMUNITY): Payer: Medicaid Other

## 2019-10-12 ENCOUNTER — Other Ambulatory Visit: Payer: Self-pay

## 2019-10-12 DIAGNOSIS — F17210 Nicotine dependence, cigarettes, uncomplicated: Secondary | ICD-10-CM | POA: Insufficient documentation

## 2019-10-12 DIAGNOSIS — M25561 Pain in right knee: Secondary | ICD-10-CM | POA: Diagnosis present

## 2019-10-12 DIAGNOSIS — I2510 Atherosclerotic heart disease of native coronary artery without angina pectoris: Secondary | ICD-10-CM | POA: Insufficient documentation

## 2019-10-12 DIAGNOSIS — Z79899 Other long term (current) drug therapy: Secondary | ICD-10-CM | POA: Insufficient documentation

## 2019-10-12 DIAGNOSIS — I10 Essential (primary) hypertension: Secondary | ICD-10-CM | POA: Insufficient documentation

## 2019-10-12 MED ORDER — LIDOCAINE 5 % EX PTCH: 1.0000 | MEDICATED_PATCH | CUTANEOUS | 0 refills | Status: DC
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
## 2019-10-12 NOTE — ED Notes (Signed)
Knee sleeve unavailable at this time.  Pt says he has a knee sleeve at home.  Applied ace wrap.   Notified PA.  BP also elevated, pt says has taken his bp medication for the day.  PA aware and instructed pt to follow up with pcp and return to ER as needed.

## 2019-10-12 NOTE — Discharge Instructions (Addendum)
Your x-rays are negative for any acute injury.  I suspect you may have strained your knee, although we cannot completely rule out a new meniscal injury today.  Use ice as we discussed as much as possible.  You may add a heating pad to your knee for 20 minutes 3-4 times daily starting on Saturday.  Plan to see Dr. Aline Brochure if your symptoms persist despite this treatment plan.

## 2019-10-12 NOTE — ED Triage Notes (Signed)
Pt reports r knee surgery in June and reports twisted r knee yesterday

## 2019-10-18 ENCOUNTER — Encounter (HOSPITAL_COMMUNITY): Payer: Self-pay | Admitting: Emergency Medicine

## 2019-10-18 ENCOUNTER — Emergency Department (HOSPITAL_COMMUNITY)
Admission: EM | Admit: 2019-10-18 | Discharge: 2019-10-18 | Disposition: A | Discharge status: Home / Self Care | Payer: Medicaid Other | Attending: Emergency Medicine | Admitting: Emergency Medicine

## 2019-10-18 ENCOUNTER — Other Ambulatory Visit: Payer: Self-pay

## 2019-10-18 ENCOUNTER — Emergency Department (HOSPITAL_COMMUNITY): Payer: Medicaid Other

## 2019-10-18 DIAGNOSIS — Z79899 Other long term (current) drug therapy: Secondary | ICD-10-CM | POA: Diagnosis not present

## 2019-10-18 DIAGNOSIS — Z7984 Long term (current) use of oral hypoglycemic drugs: Secondary | ICD-10-CM | POA: Diagnosis not present

## 2019-10-18 DIAGNOSIS — I10 Essential (primary) hypertension: Secondary | ICD-10-CM | POA: Insufficient documentation

## 2019-10-18 DIAGNOSIS — F17210 Nicotine dependence, cigarettes, uncomplicated: Secondary | ICD-10-CM | POA: Insufficient documentation

## 2019-10-18 DIAGNOSIS — Y93I9 Activity, other involving external motion: Secondary | ICD-10-CM | POA: Insufficient documentation

## 2019-10-18 DIAGNOSIS — S93402A Sprain of unspecified ligament of left ankle, initial encounter: Secondary | ICD-10-CM | POA: Diagnosis not present

## 2019-10-18 DIAGNOSIS — S99912A Unspecified injury of left ankle, initial encounter: Secondary | ICD-10-CM | POA: Diagnosis present

## 2019-10-18 DIAGNOSIS — I2510 Atherosclerotic heart disease of native coronary artery without angina pectoris: Secondary | ICD-10-CM | POA: Insufficient documentation

## 2019-10-18 DIAGNOSIS — Y999 Unspecified external cause status: Secondary | ICD-10-CM | POA: Insufficient documentation

## 2019-10-18 DIAGNOSIS — Y92410 Unspecified street and highway as the place of occurrence of the external cause: Secondary | ICD-10-CM | POA: Diagnosis not present

## 2019-10-18 DIAGNOSIS — M25562 Pain in left knee: Secondary | ICD-10-CM | POA: Insufficient documentation

## 2019-10-18 NOTE — Discharge Instructions (Signed)
Please use ankle brace and crutches until you can follow up with Dr. Aline Brochure for further evaluation You can take 800 mg Ibuprofen and 1,000 mg Tylenol every 8 hours as needed for pain While at home please rest, ice, and elevate your ankle to reduce swelling  Return to the ED Immediately for any worsening symptoms including worsening pain to your leg, worsening swelling, redness to your leg, shortness of breath, or chest pain.
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
## 2019-10-25 ENCOUNTER — Ambulatory Visit: Payer: Medicaid Other | Admitting: Orthopedic Surgery

## 2019-10-25 ENCOUNTER — Other Ambulatory Visit: Payer: Self-pay

## 2019-10-25 ENCOUNTER — Encounter: Payer: Self-pay | Admitting: Orthopedic Surgery

## 2019-10-25 VITALS — BP 147/88 | HR 81 | Ht 74.0 in | Wt 220.0 lb

## 2019-10-25 DIAGNOSIS — S9002XA Contusion of left ankle, initial encounter: Secondary | ICD-10-CM

## 2019-10-25 DIAGNOSIS — M25561 Pain in right knee: Secondary | ICD-10-CM

## 2019-10-25 DIAGNOSIS — G8929 Other chronic pain: Secondary | ICD-10-CM

## 2019-10-25 MED ORDER — HYDROCODONE-ACETAMINOPHEN 7.5-325 MG PO TABS: 1.0000 | ORAL_TABLET | ORAL | 0 refills | Status: AC | PRN
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
## 2019-10-30 ENCOUNTER — Ambulatory Visit: Payer: Medicaid Other | Admitting: Orthopedic Surgery

## 2019-10-30 ENCOUNTER — Telehealth: Payer: Self-pay | Admitting: Orthopedic Surgery

## 2019-10-30 NOTE — Telephone Encounter (Signed)
Patient called stating he hasn't heard from our office in reply to his call this morning. I found in his chart exactly what Dr. Aline Brochure had said about his medication and relayed this information to the patient. He stated that it is in his chart that he is allergic to Hydrocodone.

## 2019-10-30 NOTE — Telephone Encounter (Signed)
Patient called to relay that the medication prescribed at time of visit: HYDROcodone-acetaminophen ([place]) 7.5-325 MG tablet 42 tablet   is a medicine that he cannot take-said it is causing stomach upset, and said it should be noted that in past he had been allergic to it. Patient states he did pick it up at [facility]. Please advise.

## 2019-10-30 NOTE — Telephone Encounter (Signed)
Ok no problem take tylenol xs 500 mg q 6

## 2019-11-08 ENCOUNTER — Ambulatory Visit (INDEPENDENT_AMBULATORY_CARE_PROVIDER_SITE_OTHER): Payer: Medicaid Other | Admitting: Urology

## 2019-11-08 ENCOUNTER — Encounter: Payer: Self-pay | Admitting: Urology

## 2019-11-08 ENCOUNTER — Other Ambulatory Visit: Payer: Self-pay

## 2019-11-08 VITALS — BP 125/82 | HR 79 | Temp 96.8°F | Ht 72.0 in | Wt 235.0 lb

## 2019-11-08 DIAGNOSIS — N50812 Left testicular pain: Secondary | ICD-10-CM

## 2019-11-08 DIAGNOSIS — N522 Drug-induced erectile dysfunction: Secondary | ICD-10-CM | POA: Diagnosis not present

## 2019-11-08 MED ORDER — TADALAFIL 20 MG PO TABS: 20.0000 mg | ORAL_TABLET | Freq: Every day | ORAL | 5 refills | Status: DC | PRN
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
## 2019-11-08 NOTE — Progress Notes (Signed)

## 2019-11-08 NOTE — Patient Instructions (Signed)
Erectile Dysfunction Erectile dysfunction (ED) is the inability to get or keep an erection in order to have sexual intercourse. Erectile dysfunction may include:  Inability to get an erection.  Lack of enough hardness of the erection to allow penetration.  Loss of the erection before sex is finished. What are the causes? This condition may be caused by:  Certain medicines, such as: ? Pain relievers. ? Antihistamines. ? Antidepressants. ? Blood pressure medicines. ? Water pills (diuretics). ? Ulcer medicines. ? Muscle relaxants. ? Drugs.  Excessive drinking.  Psychological causes, such as: ? Anxiety. ? Depression. ? Sadness. ? Exhaustion. ? Performance fear. ? Stress.  Physical causes, such as: ? Artery problems. This may include diabetes, smoking, liver disease, or atherosclerosis. ? High blood pressure. ? Hormonal problems, such as low testosterone. ? Obesity. ? Nerve problems. This may include back or pelvic injuries, diabetes mellitus, multiple sclerosis, or Parkinson disease. What are the signs or symptoms? Symptoms of this condition include:  Inability to get an erection.  Lack of enough hardness of the erection to allow penetration.  Loss of the erection before sex is finished.  Normal erections at some times, but with frequent unsatisfactory episodes.  Low sexual satisfaction in either partner due to erection problems.  A curved penis occurring with erection. The curve may cause pain or the penis may be too curved to allow for intercourse.  Never having nighttime erections. How is this diagnosed? This condition is often diagnosed by:  Performing a physical exam to find other diseases or specific problems with the penis.  Asking you detailed questions about the problem.  Performing blood tests to check for diabetes mellitus or to measure hormone levels.  Performing other tests to check for underlying health conditions.  Performing an ultrasound  exam to check for scarring.  Performing a test to check blood flow to the penis.  Doing a sleep study at home to measure nighttime erections. How is this treated? This condition may be treated by:  Medicine taken by mouth to help you achieve an erection (oral medicine).  Hormone replacement therapy to replace low testosterone levels.  Medicine that is injected into the penis. Your health care provider may instruct you how to give yourself these injections at home.  Vacuum pump. This is a pump with a ring on it. The pump and ring are placed on the penis and used to create pressure that helps the penis become erect.  Penile implant surgery. In this procedure, you may receive: ? An inflatable implant. This consists of cylinders, a pump, and a reservoir. The cylinders can be inflated with a fluid that helps to create an erection, and they can be deflated after intercourse. ? A semi-rigid implant. This consists of two silicone rubber rods. The rods provide some rigidity. They are also flexible, so the penis can both curve downward in its normal position and become straight for sexual intercourse.  Blood vessel surgery, to improve blood flow to the penis. During this procedure, a blood vessel from a different part of the body is placed into the penis to allow blood to flow around (bypass) damaged or blocked blood vessels.  Lifestyle changes, such as exercising more, losing weight, and quitting smoking. Follow these instructions at home: Medicines   Take over-the-counter and prescription medicines only as told by your health care provider. Do not increase the dosage without first discussing it with your health care provider.  If you are using self-injections, perform injections as directed by your   health care provider. Make sure to avoid any veins that are on the surface of the penis. After giving an injection, apply pressure to the injection site for 5 minutes. General  instructions  Exercise regularly, as directed by your health care provider. Work with your health care provider to lose weight, if needed.  Do not use any products that contain nicotine or tobacco, such as cigarettes and e-cigarettes. If you need help quitting, ask your health care provider.  Before using a vacuum pump, read the instructions that come with the pump and discuss any questions with your health care provider.  Keep all follow-up visits as told by your health care provider. This is important. Contact a health care provider if:  You feel nauseous.  You vomit. Get help right away if:  You are taking oral or injectable medicines and you have an erection that lasts longer than 4 hours. If your health care provider is unavailable, go to the nearest emergency room for evaluation. An erection that lasts much longer than 4 hours can result in permanent damage to your penis.  You have severe pain in your groin or abdomen.  You develop redness or severe swelling of your penis.  You have redness spreading up into your groin or lower abdomen.  You are unable to urinate.  You experience chest pain or a rapid heart beat (palpitations) after taking oral medicines. Summary  Erectile dysfunction (ED) is the inability to get or keep an erection during sexual intercourse. This problem can usually be treated successfully.  This condition is diagnosed based on a physical exam, your symptoms, and tests to determine the cause. Treatment varies depending on the cause, and may include medicines, hormone therapy, surgery, or vacuum pump.  You may need follow-up visits to make sure that you are using your medicines or devices correctly.  Get help right away if you are taking or injecting medicines and you have an erection that lasts longer than 4 hours. This information is not intended to replace advice given to you by your health care provider. Make sure you discuss any questions you have with  your health care provider. Document Revised: 08/20/2017 Document Reviewed: 09/23/2016 Elsevier Patient Education  2020 [employer]  

## 2019-11-14 ENCOUNTER — Telehealth: Payer: Self-pay

## 2019-11-14 NOTE — Telephone Encounter (Signed)
Received a message from Ashley Akin with [facility] that pt is requesting a call back. I called pt number listed ( same number given by staff of [facility] as well) [phone]  Phone number is not a working number,  [employer] via my chart to contact office

## 2019-11-22 ENCOUNTER — Ambulatory Visit (INDEPENDENT_AMBULATORY_CARE_PROVIDER_SITE_OTHER): Payer: Medicaid Other | Admitting: Orthopedic Surgery

## 2019-11-22 ENCOUNTER — Other Ambulatory Visit: Payer: Self-pay

## 2019-11-22 VITALS — BP 125/92 | HR 90 | Temp 97.9°F | Ht 72.0 in | Wt 235.0 lb

## 2019-11-22 DIAGNOSIS — G8929 Other chronic pain: Secondary | ICD-10-CM

## 2019-11-22 DIAGNOSIS — M25561 Pain in right knee: Secondary | ICD-10-CM | POA: Diagnosis not present

## 2019-11-22 DIAGNOSIS — S86302A Unspecified injury of muscle(s) and tendon(s) of peroneal muscle group at lower leg level, left leg, initial encounter: Secondary | ICD-10-CM

## 2019-11-22 DIAGNOSIS — S9002XD Contusion of left ankle, subsequent encounter: Secondary | ICD-10-CM

## 2019-11-22 NOTE — Progress Notes (Signed)
Chief Complaint  Patient presents with  . Follow-up    Recheck on left ankle, DOI 10-18-19.    Jermaine [name] is still having pain in his left ankle.  His pain is behind his fibula he has swelling and tenderness there and still has pain when he plantar flexes and inverts his foot  His clinical exam shows tenderness and swelling in the peroneal tendons with weakness near the posterior portion of the lateral malleolus  I looked at his x-ray again I do not see a fracture  It seems that he is ruptured his peroneal retinaculum has a peroneal tendon rupture or tear to go with it  MRI left ankle  Refer patient to pain clinic he is allergic to codeine hydrocodeine cannot take NSAIDs or Tylenol  I cannot manage his pain  Encounter Diagnoses  Name Primary?  . Contusion of left ankle, subsequent encounter Yes  . Peroneal tendon injury, left, initial encounter   . Chronic pain of right knee

## 2019-11-22 NOTE — Patient Instructions (Signed)
You have been scheduled for an MRI scan  We will call your insurance company to do a precertification to get the MRI covered  You will receive a phone call regarding the date of the scan  Dr Aline Brochure will call you with the results

## 2019-11-28 ENCOUNTER — Telehealth: Payer: Self-pay | Admitting: Radiology

## 2019-11-28 NOTE — Telephone Encounter (Signed)
I will call patient  Jermaine [name] will not approve his MRI yet He has to bee seen back after 6 weeks of conservative care first to document no improvement.

## 2019-11-28 NOTE — Telephone Encounter (Signed)
I called him phone disconnected If he calls back we need to make appointment for 6 weeks after injury (next week) to try again for the MRI  Has to be 6 weeks of treatment first  I have been unable to reach him

## 2019-12-01 ENCOUNTER — Other Ambulatory Visit: Payer: Self-pay | Admitting: Orthopedic Surgery

## 2019-12-20 NOTE — Patient Instructions (Signed)
Jermaine [name]  12/20/2019     @PREFPERIOPPHARMACY @   Your procedure is scheduled on   12/25/2019   Report to [facility] at  [email or web address].  Call this number if you have problems the morning of surgery:  [phone]   Remember:  Do not eat or drink after midnight.                        Take these medicines the morning of surgery with A SIP OF WATER  Amlodipine, atenolol, buspar, clonidine, flexeril, pristiq, gabapentin, hydralazine, levothyroxine, lithium, prilosec, zofran or phenergan(if needed), trilieptal,rexulti, effexor.    Do not wear jewelry, make-up or nail polish.  Do not wear lotions, powders, or perfumes. Please wear deodorant and brush your teeth,  Do not shave 48 hours prior to surgery.  Men may shave face and neck.  Do not bring valuables to the hospital.  [facility] is not responsible for any belongings or valuables.  Contacts, dentures or bridgework may not be worn into surgery.  Leave your suitcase in the car.  After surgery it may be brought to your room.  For patients admitted to the hospital, discharge time will be determined by your treatment team.  Patients discharged the day of surgery will not be allowed to drive home.   Name and phone number of your driver:   family Special instructions:  DO NOT smoke the morning of your procedure.  Please read over the following fact sheets that you were given. Anesthesia Post-op Instructions and Care and Recovery After Surgery       Incision Care, Adult An incision is a cut that a doctor makes in your skin for surgery (for a procedure). Most times, these cuts are closed after surgery. Your cut from surgery may be closed with stitches (sutures), staples, skin glue, or skin tape (adhesive strips). You may need to return to your doctor to have stitches or staples taken out. This may happen many days or many weeks after your surgery. The cut needs to be well cared for so it does not get infected. How to care  for your cut Cut care   Follow instructions from your doctor about how to take care of your cut. Make sure you: ? Wash your hands with soap and water before you change your bandage (dressing). If you cannot use soap and water, use hand sanitizer. ? Change your bandage as told by your doctor. ? Leave stitches, skin glue, or skin tape in place. They may need to stay in place for 2 weeks or longer. If tape strips get loose and curl up, you may trim the loose edges. Do not remove tape strips completely unless your doctor says it is okay.  Check your cut area every day for signs of infection. Check for: ? More redness, swelling, or pain. ? More fluid or blood. ? Warmth. ? Pus or a bad smell.  Ask your doctor how to clean the cut. This may include: ? Using mild soap and water. ? Using a clean towel to pat the cut dry after you clean it. ? Putting a cream or ointment on the cut. Do this only as told by your doctor. ? Covering the cut with a clean bandage.  Ask your doctor when you can leave the cut uncovered.  Do not take baths, swim, or use a hot tub until your doctor says it is okay. Ask  your doctor if you can take showers. You may only be allowed to take sponge baths for bathing. Medicines  If you were prescribed an antibiotic medicine, cream, or ointment, take the antibiotic or put it on the cut as told by your doctor. Do not stop taking or putting on the antibiotic even if your condition gets better.  Take over-the-counter and prescription medicines only as told by your doctor. General instructions  Limit movement around your cut. This helps healing. ? Avoid straining, lifting, or exercise for the first month, or for as long as told by your doctor. ? Follow instructions from your doctor about going back to your normal activities. ? Ask your doctor what activities are safe.  Protect your cut from the sun when you are outside for the first 6 months, or for as long as told by your  doctor. Put on sunscreen around the scar or cover up the scar.  Keep all follow-up visits as told by your doctor. This is important. Contact a doctor if:  Your have more redness, swelling, or pain around the cut.  You have more fluid or blood coming from the cut.  Your cut feels warm to the touch.  You have pus or a bad smell coming from the cut.  You have a fever or shaking chills.  You feel sick to your stomach (nauseous) or you throw up (vomit).  You are dizzy.  Your stitches or staples come undone. Get help right away if:  You have a red streak coming from your cut.  Your cut bleeds through the bandage and the bleeding does not stop with gentle pressure.  The edges of your cut open up and separate.  You have very bad (severe) pain.  You have a rash.  You are confused.  You pass out (faint).  You have trouble breathing and you have a fast heartbeat. This information is not intended to replace advice given to you by your health care provider. Make sure you discuss any questions you have with your health care provider. Document Revised: 01/25/2017 Document Reviewed: 05/15/2016 Elsevier Patient Education  2020 [place] Anesthesia, Adult, Care After This sheet gives you information about how to care for yourself after your procedure. Your health care provider may also give you more specific instructions. If you have problems or questions, contact your health care provider. What can I expect after the procedure? After the procedure, the following side effects are common:  Pain or discomfort at the IV site.  Nausea.  Vomiting.  Sore throat.  Trouble concentrating.  Feeling cold or chills.  Weak or tired.  Sleepiness and fatigue.  Soreness and body aches. These side effects can affect parts of the body that were not involved in surgery. Follow these instructions at home:  For at least 24 hours after the procedure:  Have a responsible adult  stay with you. It is important to have someone help care for you until you are awake and alert.  Rest as needed.  Do not: ? Participate in activities in which you could fall or become injured. ? Drive. ? Use heavy machinery. ? Drink alcohol. ? Take sleeping pills or medicines that cause drowsiness. ? Make important decisions or sign legal documents. ? Take care of children on your own. Eating and drinking  Follow any instructions from your health care provider about eating or drinking restrictions.  When you feel hungry, start by eating small amounts of foods that are soft and easy  to digest (bland), such as toast. Gradually return to your regular diet.  Drink enough fluid to keep your urine pale yellow.  If you vomit, rehydrate by drinking water, juice, or clear broth. General instructions  If you have sleep apnea, surgery and certain medicines can increase your risk for breathing problems. Follow instructions from your health care provider about wearing your sleep device: ? Anytime you are sleeping, including during daytime naps. ? While taking prescription pain medicines, sleeping medicines, or medicines that make you drowsy.  Return to your normal activities as told by your health care provider. Ask your health care provider what activities are safe for you.  Take over-the-counter and prescription medicines only as told by your health care provider.  If you smoke, do not smoke without supervision.  Keep all follow-up visits as told by your health care provider. This is important. Contact a health care provider if:  You have nausea or vomiting that does not get better with medicine.  You cannot eat or drink without vomiting.  You have pain that does not get better with medicine.  You are unable to pass urine.  You develop a skin rash.  You have a fever.  You have redness around your IV site that gets worse. Get help right away if:  You have difficulty  breathing.  You have chest pain.  You have blood in your urine or stool, or you vomit blood. Summary  After the procedure, it is common to have a sore throat or nausea. It is also common to feel tired.  Have a responsible adult stay with you for the first 24 hours after general anesthesia. It is important to have someone help care for you until you are awake and alert.  When you feel hungry, start by eating small amounts of foods that are soft and easy to digest (bland), such as toast. Gradually return to your regular diet.  Drink enough fluid to keep your urine pale yellow.  Return to your normal activities as told by your health care provider. Ask your health care provider what activities are safe for you. This information is not intended to replace advice given to you by your health care provider. Make sure you discuss any questions you have with your health care provider. Document Revised: 09/10/2017 Document Reviewed: 04/23/2017 Elsevier Patient Education  [place]. How to Use Chlorhexidine for Bathing Chlorhexidine gluconate (CHG) is a germ-killing (antiseptic) solution that is used to clean the skin. It can get rid of the bacteria that normally live on the skin and can keep them away for about 24 hours. To clean your skin with CHG, you may be given:  A CHG solution to use in the shower or as part of a sponge bath.  A prepackaged cloth that contains CHG. Cleaning your skin with CHG may help lower the risk for infection:  While you are staying in the intensive care unit of the hospital.  If you have a vascular access, such as a central line, to provide short-term or long-term access to your veins.  If you have a catheter to drain urine from your bladder.  If you are on a ventilator. A ventilator is a machine that helps you breathe by moving air in and out of your lungs.  After surgery. What are the risks? Risks of using CHG include:  A skin reaction.  Hearing  loss, if CHG gets in your ears.  Eye injury, if CHG gets in your eyes and  is not rinsed out.  The CHG product catching fire. Make sure that you avoid smoking and flames after applying CHG to your skin. Do not use CHG:  If you have a chlorhexidine allergy or have previously reacted to chlorhexidine.  On babies younger than 56 months of age. How to use CHG solution  Use CHG only as told by your health care provider, and follow the instructions on the label.  Use the full amount of CHG as directed. Usually, this is one bottle. During a shower Follow these steps when using CHG solution during a shower (unless your health care provider gives you different instructions): 1. Start the shower. 2. Use your normal soap and shampoo to wash your face and hair. 3. Turn off the shower or move out of the shower stream. 4. Pour the CHG onto a clean washcloth. Do not use any type of brush or rough-edged sponge. 5. Starting at your neck, lather your body down to your toes. Make sure you follow these instructions: ? If you will be having surgery, pay special attention to the part of your body where you will be having surgery. Scrub this area for at least 1 minute. ? Do not use CHG on your head or face. If the solution gets into your ears or eyes, rinse them well with water. ? Avoid your genital area. ? Avoid any areas of skin that have broken skin, cuts, or scrapes. ? Scrub your back and under your arms. Make sure to wash skin folds. 6. Let the lather sit on your skin for 1-2 minutes or as long as told by your health care provider. 7. Thoroughly rinse your entire body in the shower. Make sure that all body creases and crevices are rinsed well. 8. Dry off with a clean towel. Do not put any substances on your body afterward--such as powder, lotion, or perfume--unless you are told to do so by your health care provider. Only use lotions that are recommended by the manufacturer. 9. Put on clean clothes or  pajamas. 10. If it is the night before your surgery, sleep in clean sheets.  During a sponge bath Follow these steps when using CHG solution during a sponge bath (unless your health care provider gives you different instructions): 1. Use your normal soap and shampoo to wash your face and hair. 2. Pour the CHG onto a clean washcloth. 3. Starting at your neck, lather your body down to your toes. Make sure you follow these instructions: ? If you will be having surgery, pay special attention to the part of your body where you will be having surgery. Scrub this area for at least 1 minute. ? Do not use CHG on your head or face. If the solution gets into your ears or eyes, rinse them well with water. ? Avoid your genital area. ? Avoid any areas of skin that have broken skin, cuts, or scrapes. ? Scrub your back and under your arms. Make sure to wash skin folds. 4. Let the lather sit on your skin for 1-2 minutes or as long as told by your health care provider. 5. Using a different clean, wet washcloth, thoroughly rinse your entire body. Make sure that all body creases and crevices are rinsed well. 6. Dry off with a clean towel. Do not put any substances on your body afterward--such as powder, lotion, or perfume--unless you are told to do so by your health care provider. Only use lotions that are recommended by the manufacturer. 7. Put  on clean clothes or pajamas. 8. If it is the night before your surgery, sleep in clean sheets. How to use CHG prepackaged cloths  Only use CHG cloths as told by your health care provider, and follow the instructions on the label.  Use the CHG cloth on clean, dry skin.  Do not use the CHG cloth on your head or face unless your health care provider tells you to.  When washing with the CHG cloth: ? Avoid your genital area. ? Avoid any areas of skin that have broken skin, cuts, or scrapes. Before surgery Follow these steps when using a CHG cloth to clean before surgery  (unless your health care provider gives you different instructions): 1. Using the CHG cloth, vigorously scrub the part of your body where you will be having surgery. Scrub using a back-and-forth motion for 3 minutes. The area on your body should be completely wet with CHG when you are done scrubbing. 2. Do not rinse. Discard the cloth and let the area air-dry. Do not put any substances on the area afterward, such as powder, lotion, or perfume. 3. Put on clean clothes or pajamas. 4. If it is the night before your surgery, sleep in clean sheets.  For general bathing Follow these steps when using CHG cloths for general bathing (unless your health care provider gives you different instructions). 1. Use a separate CHG cloth for each area of your body. Make sure you wash between any folds of skin and between your fingers and toes. Wash your body in the following order, switching to a new cloth after each step: ? The front of your neck, shoulders, and chest. ? Both of your arms, under your arms, and your hands. ? Your stomach and groin area, avoiding the genitals. ? Your right leg and foot. ? Your left leg and foot. ? The back of your neck, your back, and your buttocks. 2. Do not rinse. Discard the cloth and let the area air-dry. Do not put any substances on your body afterward--such as powder, lotion, or perfume--unless you are told to do so by your health care provider. Only use lotions that are recommended by the manufacturer. 3. Put on clean clothes or pajamas. Contact a health care provider if:  Your skin gets irritated after scrubbing.  You have questions about using your solution or cloth. Get help right away if:  Your eyes become very red or swollen.  Your eyes itch badly.  Your skin itches badly and is red or swollen.  Your hearing changes.  You have trouble seeing.  You have swelling or tingling in your mouth or throat.  You have trouble breathing.  You swallow any  chlorhexidine. Summary  Chlorhexidine gluconate (CHG) is a germ-killing (antiseptic) solution that is used to clean the skin. Cleaning your skin with CHG may help to lower your risk for infection.  You may be given CHG to use for bathing. It may be in a bottle or in a prepackaged cloth to use on your skin. Carefully follow your health care provider's instructions and the instructions on the product label.  Do not use CHG if you have a chlorhexidine allergy.  Contact your health care provider if your skin gets irritated after scrubbing. This information is not intended to replace advice given to you by your health care provider. Make sure you discuss any questions you have with your health care provider. Document Revised: 11/24/2018 Document Reviewed: 08/05/2017 Elsevier Patient Education  [place].

## 2019-12-21 ENCOUNTER — Other Ambulatory Visit: Payer: Self-pay

## 2019-12-21 ENCOUNTER — Encounter (HOSPITAL_COMMUNITY): Payer: Self-pay

## 2019-12-21 ENCOUNTER — Other Ambulatory Visit (HOSPITAL_COMMUNITY)
Admission: RE | Admit: 2019-12-21 | Discharge: 2019-12-21 | Disposition: A | Discharge status: Home / Self Care | Payer: Medicaid Other | Source: Ambulatory Visit | Attending: Urology | Admitting: Urology

## 2019-12-21 ENCOUNTER — Encounter (HOSPITAL_COMMUNITY)
Admission: RE | Admit: 2019-12-21 | Discharge: 2019-12-21 | Disposition: A | Discharge status: Home / Self Care | Payer: Medicaid Other | Source: Ambulatory Visit | Attending: Urology | Admitting: Urology

## 2019-12-21 DIAGNOSIS — Z01818 Encounter for other preprocedural examination: Secondary | ICD-10-CM | POA: Diagnosis present

## 2019-12-21 DIAGNOSIS — Z20822 Contact with and (suspected) exposure to covid-19: Secondary | ICD-10-CM | POA: Insufficient documentation

## 2019-12-21 HISTORY — DX: Type 2 diabetes mellitus without complications: E11.9

## 2019-12-21 LAB — BASIC METABOLIC PANEL
Anion gap: 5 (ref 5–15)
BUN: 7 mg/dL (ref 6–20)
CO2: 27 mmol/L (ref 22–32)
Calcium: 8.8 mg/dL — ABNORMAL LOW (ref 8.9–10.3)
Chloride: 110 mmol/L (ref 98–111)
Creatinine, Ser: 1.05 mg/dL (ref 0.61–1.24)
GFR calc Af Amer: >60 mL/min (ref >60)
GFR calc non Af Amer: >60 mL/min (ref >60)
Glucose, Bld: 73 mg/dL (ref 70–99)
Potassium: 3.7 mmol/L (ref 3.5–5.1)
Sodium: 142 mmol/L (ref 135–145)

## 2019-12-21 LAB — CBC WITH DIFFERENTIAL/PLATELET
Abs Immature Granulocytes: 0.02 10*3/uL (ref 0.00–0.07)
Basophils Absolute: 0 10*3/uL (ref 0.0–0.1)
Basophils Relative: 0 %
Eosinophils Absolute: 0.2 10*3/uL (ref 0.0–0.5)
Eosinophils Relative: 3 %
HCT: 43.3 % (ref 39.0–52.0)
Hemoglobin: 14.6 g/dL (ref 13.0–17.0)
Immature Granulocytes: 0 %
Lymphocytes Relative: 40 %
Lymphs Abs: 2.9 10*3/uL (ref 0.7–4.0)
MCH: 33 pg (ref 26.0–34.0)
MCHC: 33.7 g/dL (ref 30.0–36.0)
MCV: 97.7 fL (ref 80.0–100.0)
Monocytes Absolute: 0.5 10*3/uL (ref 0.1–1.0)
Monocytes Relative: 6 %
Neutro Abs: 3.7 10*3/uL (ref 1.7–7.7)
Neutrophils Relative %: 51 %
Platelets: 230 10*3/uL (ref 150–400)
RBC: 4.43 MIL/uL (ref 4.22–5.81)
RDW: 13 % (ref 11.5–15.5)
WBC: 7.2 10*3/uL (ref 4.0–10.5)
nRBC: 0 % (ref 0.0–0.2)

## 2019-12-21 LAB — HEMOGLOBIN A1C
Hgb A1c MFr Bld: 5.4 % (ref 4.8–5.6)
Mean Plasma Glucose: 108.28 mg/dL

## 2019-12-22 LAB — SARS CORONAVIRUS 2 (TAT 6-24 HRS): SARS Coronavirus 2: NEGATIVE

## 2019-12-25 ENCOUNTER — Ambulatory Visit (HOSPITAL_COMMUNITY): Payer: Medicaid Other | Admitting: Anesthesiology

## 2019-12-25 ENCOUNTER — Ambulatory Visit (HOSPITAL_COMMUNITY)
Admission: RE | Admit: 2019-12-25 | Discharge: 2019-12-25 | Disposition: A | Discharge status: Home / Self Care | Payer: Medicaid Other | Attending: Urology | Admitting: Urology

## 2019-12-25 ENCOUNTER — Encounter (HOSPITAL_COMMUNITY)
Admission: RE | Disposition: A | Discharge status: Home / Self Care | Payer: Self-pay | Source: Home / Self Care | Attending: Urology

## 2019-12-25 ENCOUNTER — Encounter (HOSPITAL_COMMUNITY): Payer: Self-pay | Admitting: Urology

## 2019-12-25 DIAGNOSIS — I10 Essential (primary) hypertension: Secondary | ICD-10-CM | POA: Insufficient documentation

## 2019-12-25 DIAGNOSIS — Z823 Family history of stroke: Secondary | ICD-10-CM | POA: Diagnosis not present

## 2019-12-25 DIAGNOSIS — Z791 Long term (current) use of non-steroidal anti-inflammatories (NSAID): Secondary | ICD-10-CM | POA: Diagnosis not present

## 2019-12-25 DIAGNOSIS — Z79899 Other long term (current) drug therapy: Secondary | ICD-10-CM | POA: Insufficient documentation

## 2019-12-25 DIAGNOSIS — K219 Gastro-esophageal reflux disease without esophagitis: Secondary | ICD-10-CM | POA: Diagnosis not present

## 2019-12-25 DIAGNOSIS — E039 Hypothyroidism, unspecified: Secondary | ICD-10-CM | POA: Insufficient documentation

## 2019-12-25 DIAGNOSIS — Z8249 Family history of ischemic heart disease and other diseases of the circulatory system: Secondary | ICD-10-CM | POA: Diagnosis not present

## 2019-12-25 DIAGNOSIS — R569 Unspecified convulsions: Secondary | ICD-10-CM | POA: Diagnosis not present

## 2019-12-25 DIAGNOSIS — F17210 Nicotine dependence, cigarettes, uncomplicated: Secondary | ICD-10-CM | POA: Diagnosis not present

## 2019-12-25 DIAGNOSIS — I2510 Atherosclerotic heart disease of native coronary artery without angina pectoris: Secondary | ICD-10-CM | POA: Diagnosis not present

## 2019-12-25 DIAGNOSIS — E118 Type 2 diabetes mellitus with unspecified complications: Secondary | ICD-10-CM | POA: Insufficient documentation

## 2019-12-25 DIAGNOSIS — G4730 Sleep apnea, unspecified: Secondary | ICD-10-CM | POA: Insufficient documentation

## 2019-12-25 DIAGNOSIS — Z7984 Long term (current) use of oral hypoglycemic drugs: Secondary | ICD-10-CM | POA: Insufficient documentation

## 2019-12-25 DIAGNOSIS — Z885 Allergy status to narcotic agent status: Secondary | ICD-10-CM | POA: Insufficient documentation

## 2019-12-25 DIAGNOSIS — F17200 Nicotine dependence, unspecified, uncomplicated: Secondary | ICD-10-CM | POA: Insufficient documentation

## 2019-12-25 DIAGNOSIS — F603 Borderline personality disorder: Secondary | ICD-10-CM | POA: Insufficient documentation

## 2019-12-25 DIAGNOSIS — Z833 Family history of diabetes mellitus: Secondary | ICD-10-CM | POA: Insufficient documentation

## 2019-12-25 DIAGNOSIS — L905 Scar conditions and fibrosis of skin: Secondary | ICD-10-CM | POA: Diagnosis not present

## 2019-12-25 DIAGNOSIS — Z800 Family history of malignant neoplasm of digestive organs: Secondary | ICD-10-CM | POA: Diagnosis not present

## 2019-12-25 DIAGNOSIS — F419 Anxiety disorder, unspecified: Secondary | ICD-10-CM | POA: Diagnosis not present

## 2019-12-25 DIAGNOSIS — F319 Bipolar disorder, unspecified: Secondary | ICD-10-CM | POA: Insufficient documentation

## 2019-12-25 DIAGNOSIS — N50812 Left testicular pain: Secondary | ICD-10-CM | POA: Diagnosis not present

## 2019-12-25 HISTORY — PX: ORCHIOPEXY: SHX479

## 2019-12-25 LAB — GLUCOSE, CAPILLARY
Glucose-Capillary: 111 mg/dL — ABNORMAL HIGH (ref 70–99)
Glucose-Capillary: 88 mg/dL (ref 70–99)

## 2019-12-25 SURGERY — ORCHIOPEXY ADULT
Anesthesia: General | Laterality: Left

## 2019-12-25 MED ORDER — GLYCOPYRROLATE PF 0.2 MG/ML IJ SOSY
PREFILLED_SYRINGE | INTRAMUSCULAR | Status: AC
  Filled 2019-12-25: qty 3

## 2019-12-25 MED ORDER — CEFAZOLIN SODIUM-DEXTROSE 2-4 GM/100ML-% IV SOLN
INTRAVENOUS | Status: AC
  Filled 2019-12-25: qty 100

## 2019-12-25 MED ORDER — HYDROMORPHONE HCL 1 MG/ML IJ SOLN
INTRAMUSCULAR | Status: AC
  Filled 2019-12-25: qty 0.5

## 2019-12-25 MED ORDER — MIDAZOLAM HCL 5 MG/5ML IJ SOLN
INTRAMUSCULAR | Status: DC | PRN
  Administered 2019-12-25: 2 mg via INTRAVENOUS

## 2019-12-25 MED ORDER — FENTANYL CITRATE (PF) 250 MCG/5ML IJ SOLN
INTRAMUSCULAR | Status: AC
  Filled 2019-12-25: qty 5

## 2019-12-25 MED ORDER — BUPIVACAINE HCL (PF) 0.25 % IJ SOLN
INTRAMUSCULAR | Status: AC
  Filled 2019-12-25: qty 30

## 2019-12-25 MED ORDER — PROPOFOL 10 MG/ML IV BOLUS
INTRAVENOUS | Status: DC | PRN
  Administered 2019-12-25: 40 mg via INTRAVENOUS

## 2019-12-25 MED ORDER — HYDROMORPHONE HCL 1 MG/ML IJ SOLN
0.2500 mg | INTRAMUSCULAR | Status: DC | PRN
  Administered 2019-12-25 (×4): 0.5 mg via INTRAVENOUS

## 2019-12-25 MED ORDER — MIDAZOLAM HCL 2 MG/2ML IJ SOLN
2.0000 mg | Freq: Once | INTRAMUSCULAR | Status: AC
  Administered 2019-12-25: 2 mg via INTRAVENOUS

## 2019-12-25 MED ORDER — SODIUM CHLORIDE FLUSH 0.9 % IV SOLN
INTRAVENOUS | Status: AC
  Filled 2019-12-25: qty 10

## 2019-12-25 MED ORDER — LACTATED RINGERS IV SOLN: Freq: Once | INTRAVENOUS | Status: AC

## 2019-12-25 MED ORDER — BUPIVACAINE HCL (PF) 0.25 % IJ SOLN
INTRAMUSCULAR | Status: DC | PRN
  Administered 2019-12-25: 10 mL

## 2019-12-25 MED ORDER — LACTATED RINGERS IV SOLN: INTRAVENOUS | Status: DC | PRN

## 2019-12-25 MED ORDER — ONDANSETRON HCL 4 MG/2ML IJ SOLN
INTRAMUSCULAR | Status: DC | PRN
  Administered 2019-12-25: 4 mg via INTRAVENOUS

## 2019-12-25 MED ORDER — PROMETHAZINE HCL 25 MG/ML IJ SOLN
6.2500 mg | INTRAMUSCULAR | Status: DC | PRN
  Administered 2019-12-25: 6.25 mg via INTRAVENOUS
  Filled 2019-12-25: qty 1

## 2019-12-25 MED ORDER — ONDANSETRON HCL 4 MG/2ML IJ SOLN
INTRAMUSCULAR | Status: AC
  Filled 2019-12-25: qty 2

## 2019-12-25 MED ORDER — CEFAZOLIN SODIUM-DEXTROSE 2-4 GM/100ML-% IV SOLN
2.0000 g | INTRAVENOUS | Status: AC
  Administered 2019-12-25: 2 g via INTRAVENOUS

## 2019-12-25 MED ORDER — OXYCODONE-ACETAMINOPHEN 10-325 MG PO TABS: 1.0000 | ORAL_TABLET | ORAL | 0 refills | Status: DC | PRN

## 2019-12-25 MED ORDER — MIDAZOLAM HCL 2 MG/2ML IJ SOLN
INTRAMUSCULAR | Status: AC
  Filled 2019-12-25: qty 2

## 2019-12-25 MED ORDER — FENTANYL CITRATE (PF) 100 MCG/2ML IJ SOLN: 25.0000 ug | INTRAMUSCULAR | Status: DC | PRN

## 2019-12-25 MED ORDER — MEPERIDINE HCL 50 MG/ML IJ SOLN: 6.2500 mg | INTRAMUSCULAR | Status: DC | PRN

## 2019-12-25 MED ORDER — LIDOCAINE HCL (CARDIAC) PF 50 MG/5ML IV SOSY
PREFILLED_SYRINGE | INTRAVENOUS | Status: DC | PRN
  Administered 2019-12-25: 80 mg via INTRAVENOUS
  Administered 2019-12-25: 200 mg via INTRAVENOUS

## 2019-12-25 MED ORDER — SODIUM CHLORIDE 0.9 % IR SOLN
Status: DC | PRN
  Administered 2019-12-25: 1000 mL

## 2019-12-25 MED ORDER — FENTANYL CITRATE (PF) 100 MCG/2ML IJ SOLN
INTRAMUSCULAR | Status: DC | PRN
  Administered 2019-12-25 (×3): 50 ug via INTRAVENOUS

## 2019-12-25 SURGICAL SUPPLY — 36 items
ADH SKN CLS APL DERMABOND .7 (GAUZE/BANDAGES/DRESSINGS) ×1
BNDG GAUZE ELAST 4 BULKY (GAUZE/BANDAGES/DRESSINGS) ×3 IMPLANT
BRUSH SCRUB SURG 4.25 DISP (MISCELLANEOUS) ×2 IMPLANT
CLOTH BEACON ORANGE TIMEOUT ST (SAFETY) ×3 IMPLANT
COVER LIGHT HANDLE STERIS (MISCELLANEOUS) ×3 IMPLANT
DERMABOND ADVANCED (GAUZE/BANDAGES/DRESSINGS) ×2
DERMABOND ADVANCED .7 DNX12 (GAUZE/BANDAGES/DRESSINGS) ×1 IMPLANT
DRAIN PENROSE 0.5X18 (DRAIN) ×3 IMPLANT
ELECT REM PT RETURN 9FT ADLT (ELECTROSURGICAL) ×3
ELECTRODE REM PT RTRN 9FT ADLT (ELECTROSURGICAL) ×1 IMPLANT
GAUZE SPONGE 4X4 12PLY STRL (GAUZE/BANDAGES/DRESSINGS) ×3 IMPLANT
GLOVE BIO SURGEON STRL SZ8 (GLOVE) ×3 IMPLANT
GLOVE BIOGEL PI IND STRL 7.0 (GLOVE) ×2 IMPLANT
GLOVE BIOGEL PI INDICATOR 7.0 (GLOVE) ×4
GLOVE ECLIPSE 6.5 STRL STRAW (GLOVE) ×4 IMPLANT
GOWN STRL REUS W/TWL LRG LVL3 (GOWN DISPOSABLE) ×3 IMPLANT
GOWN STRL REUS W/TWL XL LVL3 (GOWN DISPOSABLE) ×3 IMPLANT
INST SET MINOR GENERAL (KITS) ×3 IMPLANT
KIT TURNOVER KIT A (KITS) ×3 IMPLANT
MANIFOLD NEPTUNE II (INSTRUMENTS) ×3 IMPLANT
NDL HYPO 25X1 1.5 SAFETY (NEEDLE) IMPLANT
NEEDLE HYPO 25X1 1.5 SAFETY (NEEDLE) ×3 IMPLANT
NS IRRIG 1000ML POUR BTL (IV SOLUTION) ×2 IMPLANT
PACK MINOR (CUSTOM PROCEDURE TRAY) ×3 IMPLANT
PAD ARMBOARD 7.5X6 YLW CONV (MISCELLANEOUS) ×3 IMPLANT
SET BASIN LINEN APH (SET/KITS/TRAYS/PACK) ×3 IMPLANT
SOL PREP PROV IODINE SCRUB 4OZ (MISCELLANEOUS) ×2 IMPLANT
SPONGE LAP 4X18 RFD (DISPOSABLE) ×6 IMPLANT
SUPPORT SCROTAL LG STRP (MISCELLANEOUS) ×2 IMPLANT
SUPPORTER ATHLETIC LG (MISCELLANEOUS) ×1
SUT CHROMIC 3 0 SH 27 (SUTURE) ×6 IMPLANT
SUT SILK 3 0 SH CR/8 (SUTURE) ×3 IMPLANT
SUT VIC AB 3-0 SH 27 (SUTURE) ×3
SUT VIC AB 3-0 SH 27XBRD (SUTURE) ×1 IMPLANT
SUT VICRYL 0 27 CT2 27 ABS (SUTURE) ×2 IMPLANT
SYR CONTROL 10ML LL (SYRINGE) ×2 IMPLANT

## 2019-12-25 NOTE — Anesthesia Postprocedure Evaluation (Signed)
Anesthesia Post Note  Patient: Jermaine [name]  Procedure(s) Performed: ORCHIOPEXY ADULT (Left )  Patient location during evaluation: PACU Anesthesia Type: General Level of consciousness: awake and alert Pain management: pain level controlled Vital Signs Assessment: post-procedure vital signs reviewed and stable Respiratory status: spontaneous breathing Cardiovascular status: blood pressure returned to baseline and stable Postop Assessment: no apparent nausea or vomiting Anesthetic complications: no     Last Vitals:  Vitals:   12/25/19 1100 12/25/19 1102  BP: 133/62 (!) 149/109  Pulse: 84 81  Resp: (!) 148 20  Temp:  36.6 C  SpO2: 98% 96%    Last Pain:  Vitals:   12/25/19 1102  TempSrc: Oral  PainSc: 6                  Lynton Crescenzo

## 2019-12-25 NOTE — Transfer of Care (Signed)
Immediate Anesthesia Transfer of Care Note  Patient: Jermaine [name]  Procedure(s) Performed: ORCHIOPEXY ADULT (Left )  Patient Location: PACU  Anesthesia Type:General  Level of Consciousness: awake  Airway & Oxygen Therapy: Patient Spontanous Breathing  Post-op Assessment: Report given to RN and Post -op Vital signs reviewed and stable  Post vital signs: Reviewed  Last Vitals:  Vitals Value Taken Time  BP    Temp    Pulse 88 12/25/19 1018  Resp 20 12/25/19 1018  SpO2 99 % 12/25/19 1018  Vitals shown include unvalidated device data.  Last Pain:  Vitals:   12/25/19 0806  TempSrc: Oral  PainSc: 5          Complications: No apparent anesthesia complications

## 2019-12-25 NOTE — Anesthesia Preprocedure Evaluation (Signed)
Anesthesia Evaluation  Patient identified by MRN, date of birth, ID band Patient awake    Reviewed: Allergy & Precautions, NPO status , Patient's Chart, lab work & pertinent test results, reviewed documented beta blocker date and time   Airway Mallampati: III  TM Distance: >3 FB Neck ROM: Full    Dental  (+) Missing, Chipped, Dental Advisory Given   Pulmonary sleep apnea , Current SmokerPatient did not abstain from smoking.,    breath sounds clear to auscultation       Cardiovascular Exercise Tolerance: Good METS: 3 - Mets hypertension, Pt. on medications and Pt. on home beta blockers + CAD   Rhythm:Regular Rate:Normal     Neuro/Psych Seizures -, Well Controlled,  PSYCHIATRIC DISORDERS Anxiety Depression Bipolar Disorder    GI/Hepatic GERD  Medicated and Controlled,  Endo/Other  diabetes, Well Controlled, Type 2Hypothyroidism   Renal/GU      Musculoskeletal   Abdominal   Peds  Hematology   Anesthesia Other Findings   Reproductive/Obstetrics                             Anesthesia Physical  Anesthesia Plan  ASA: III  Anesthesia Plan: General   Post-op Pain Management:    Induction: Intravenous  PONV Risk Score and Plan: 2 and Ondansetron, Dexamethasone and Midazolam  Airway Management Planned: LMA  Additional Equipment:   Intra-op Plan:   Post-operative Plan:   Informed Consent: I have reviewed the patients History and Physical, chart, labs and discussed the procedure including the risks, benefits and alternatives for the proposed anesthesia with the patient or authorized representative who has indicated his/her understanding and acceptance.     Dental advisory given  Plan Discussed with: CRNA and Surgeon  Anesthesia Plan Comments:         Anesthesia Quick Evaluation

## 2019-12-25 NOTE — Anesthesia Procedure Notes (Signed)
Procedure Name: LMA Insertion Date/Time: 12/25/2019 9:33 AM Performed by: Ollen Bowl, CRNA Pre-anesthesia Checklist: Patient identified, Patient being monitored, Emergency Drugs available, Timeout performed and Suction available Patient Re-evaluated:Patient Re-evaluated prior to induction Oxygen Delivery Method: Circle System Utilized Preoxygenation: Pre-oxygenation with 100% oxygen Induction Type: IV induction Ventilation: Mask ventilation without difficulty LMA: LMA inserted LMA Size: 4.0 Number of attempts: 1 Placement Confirmation: positive ETCO2 and breath sounds checked- equal and bilateral

## 2019-12-25 NOTE — Op Note (Signed)
Preoperative diagnosis: left orchalgia   Postoperative diagnosis: same  Procedure: 1.Left orchidopexy 2. Excision of left scrotal scar  Attending: Nicolette Bang, MD  Anesthesia: General  History of blood loss: Minimal  Antibiotics: ancef  Drains: none  Specimens: 1. Left scrotal scar  Findings:5 square centimeter scrotal skin lesion at prior hydrocele incision. Left orchidopexy performed with 0 vicryl on medial and lateral aspect of left testis  Indications: Patient is a 39 year old male with a history of excision of a left scrotal lesion with tethering of his left testis that was causing pain.  We discussed the treatment options including observation versus excision after discussing treatment options he decided to proceed with excision.   Procedure in detail: Prior to procedure consent was obtained.  Patient was brought to the operating room and a brief timeout was done to ensure correct patient, correct procedure, correct site.  General anesthesia was administered and patient was placed in supine position.  His genitalia was then prepped and draped in usual sterile fashion.  A 5 cm elliptical incision was made in the left hemiscrotum around the lesion and the previous hydrocelectomy scar. We then excised the scar. We dissected down to the tunica and then incised the tunica. We then performed an ochidopexy by attaching the medial aspect of the testis to the mdeial dartos and the lateral aspect of the testis to the lateral dartos with a 0 vicyl suture.  We then returned the testis to the left hemiscrotum and closed the overlying dartos with 3-0 vicryl in a running fashion in 2 layers. The skin was then closed with 4-0 chromic in a running fashion. Dermabond was placed on the incision.  A dressing was then applied to the incision.  We then placed a scrotal fluff and this then concluded the procedure which was well tolerated by the patient.  Complications: None  Condition:  Stable, extubated, transferred to PACU.  Plan: Patient is to be discharged home.  He is to follow up in 2 weeks for wound check

## 2019-12-25 NOTE — Discharge Instructions (Signed)
Hydrocele, Adult  A hydrocele is a collection of fluid in the loose pouch of skin that holds the testicles (scrotum). This may happen because:  The amount of fluid produced in the scrotum is not absorbed by the rest of the body.  Fluid from the abdomen fills the scrotum. Normally, the testicles develop in the abdomen then move (drop) into to the scrotum before birth. The tube that the testicles travel through usually closes after the testicles drop. If the tube does not close, fluid from the abdomen can fill the scrotum. This is less common in adults.  What are the causes?  The cause of a hydrocele in adults is usually not known. However, it may be caused by:  An injury to the scrotum.  An infection (epididymitis).  Decreased blood flow to the scrotum.  Twisting of a testicle (testicular torsion).  A birth defect.  A tumor or cancer of the testicle.  What are the signs or symptoms?  A hydrocele feels like a water-filled balloon. It may also feel heavy. Other symptoms include:  Swelling of the scrotum. The swelling may decrease when you lie down. You may also notice more swelling at night than in the morning.  Swelling of the groin.  Mild discomfort in the scrotum.  Pain. This can develop if the hydrocele was caused by infection or twisting. The larger the hydrocele, the more likely you are to have pain.  How is this diagnosed?  This condition may be diagnosed based on:  Physical exam.  Medical history.  You may also have other tests, including:  Imaging tests, such as ultrasound.  Blood or urine tests.  How is this treated?  Most hydroceles go away on their own. If you have no discomfort or pain, your health care provider may suggest close monitoring of your condition (called watch and wait or watchful waiting) until the condition goes away or symptoms develop. If treatment is needed, it may include:  Treating an underlying condition. This may include using an antibiotic medicine to treat an infection.  Surgery to  stop fluid from collecting in the scrotum.  Surgery to drain the fluid. Options include:  Needle aspiration. A needle is used to drain fluid. However, the fluid buildup will come back quickly.  Hydrocelectomy. For this procedure, an incision is made in the scrotum to remove the fluid sac.  Follow these instructions at home:  Watch the hydrocele for any changes.  Take over-the-counter and prescription medicines only as told by your health care provider.  If you were prescribed an antibiotic medicine, use it as told by your health care provider. Do not stop taking the antibiotic even if you start to feel better.  Keep all follow-up visits as told by your health care provider. This is important.  Contact a health care provider if:  You notice any changes in the hydrocele.  The swelling in your scrotum or groin gets worse.  The hydrocele becomes red, firm, painful, or tender to the touch.  You have a fever.  Get help right away if you:  Develop a lot of pain, or your pain becomes worse.  Summary  A hydrocele is a collection of fluid in the loose pouch of skin that holds the testicles (scrotum).  Hydroceles can cause swelling, discomfort, and sometimes pain.  In adults, the cause of a hydrocele usually is not known. However, it is sometimes caused by an infection or a rotation and twisting of the scrotum.  Treatment is usually not   may be given to ease the pain. This information is not intended to replace advice given to you by your health care provider. Make sure you discuss any questions you have with your health care provider. Document Revised: 09/18/2017 Document Reviewed: 09/18/2017 Elsevier Patient Education  2020 [place] After These instructions provide you with information about caring for yourself after your  procedure. Your health care provider may also give you more specific instructions. Your treatment has been planned according to current medical practices, but problems sometimes occur. Call your health care provider if you have any problems or questions after your procedure. What can I expect after the procedure? After your procedure, you may:  Feel sleepy for several hours.  Feel clumsy and have poor balance for several hours.  Feel forgetful about what happened after the procedure.  Have poor judgment for several hours.  Feel nauseous or vomit.  Have a sore throat if you had a breathing tube during the procedure. Follow these instructions at home: For at least 24 hours after the procedure:      Have a responsible adult stay with you. It is important to have someone help care for you until you are awake and alert.  Rest as needed.  Do not: ? Participate in activities in which you could fall or become injured. ? Drive. ? Use heavy machinery. ? Drink alcohol. ? Take sleeping pills or medicines that cause drowsiness. ? Make important decisions or sign legal documents. ? Take care of children on your own. Eating and drinking  Follow the diet that is recommended by your health care provider.  If you vomit, drink water, juice, or soup when you can drink without vomiting.  Make sure you have little or no nausea before eating solid foods. General instructions  Take over-the-counter and prescription medicines only as told by your health care provider.  If you have sleep apnea, surgery and certain medicines can increase your risk for breathing problems. Follow instructions from your health care provider about wearing your sleep device: ? Anytime you are sleeping, including during daytime naps. ? While taking prescription pain medicines, sleeping medicines, or medicines that make you drowsy.  If you smoke, do not smoke without supervision.  Keep all follow-up visits as told by  your health care provider. This is important. Contact a health care provider if:  You keep feeling nauseous or you keep vomiting.  You feel light-headed.  You develop a rash.  You have a fever. Get help right away if:  You have trouble breathing. Summary  For several hours after your procedure, you may feel sleepy and have poor judgment.  Have a responsible adult stay with you for at least 24 hours or until you are awake and alert. This information is not intended to replace advice given to you by your health care provider. Make sure you discuss any questions you have with your health care provider. Document Revised: 12/06/2017 Document Reviewed: 12/29/2015 Elsevier Patient Education  [place].
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
## 2019-12-26 LAB — SURGICAL PATHOLOGY

## 2020-01-10 ENCOUNTER — Other Ambulatory Visit: Payer: Self-pay

## 2020-01-10 ENCOUNTER — Ambulatory Visit (INDEPENDENT_AMBULATORY_CARE_PROVIDER_SITE_OTHER): Payer: Medicaid Other | Admitting: Urology

## 2020-01-10 VITALS — BP 127/68 | HR 76 | Temp 96.0°F | Ht 72.0 in | Wt 246.0 lb

## 2020-01-10 DIAGNOSIS — N50812 Left testicular pain: Secondary | ICD-10-CM

## 2020-01-10 MED ORDER — BACITRACIN 500 UNIT/GM EX OINT: 1.0000 "application " | TOPICAL_OINTMENT | Freq: Two times a day (BID) | CUTANEOUS | 2 refills | Status: DC

## 2020-01-10 MED ORDER — OXYCODONE-ACETAMINOPHEN 10-325 MG PO TABS: 1.0000 | ORAL_TABLET | ORAL | 0 refills | Status: DC | PRN

## 2020-01-10 MED ORDER — DOXYCYCLINE HYCLATE 100 MG PO CAPS: 100.0000 mg | ORAL_CAPSULE | Freq: Two times a day (BID) | ORAL | 0 refills | Status: DC

## 2020-01-10 NOTE — Progress Notes (Signed)
Urological Symptom Review  Patient is experiencing the following symptoms: none   Review of Systems  Gastrointestinal (upper)  : Indigestion/heartburn  Gastrointestinal (lower) : Negative for lower GI symptoms  Constitutional : Negative for symptoms  Skin: Negative for skin symptoms  Eyes: Negative for eye symptoms  Ear/Nose/Throat : Negative for Ear/Nose/Throat symptoms  Hematologic/Lymphatic: Negative for Hematologic/Lymphatic symptoms  Cardiovascular : Negative for cardiovascular symptoms  Respiratory : Negative for respiratory symptoms  Endocrine: Negative for endocrine symptoms  Musculoskeletal: Back pain  Neurological: Negative for neurological symptoms  Psychologic: Depression Anxiety
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
## 2020-01-10 NOTE — Patient Instructions (Signed)
Current Urology, 10(1), 1-14. https://[email or web address]">  Hydrocelectomy, Adult, Care After This sheet gives you information about how to care for yourself after your procedure. Your health care provider may also give you more specific instructions. If you have problems or questions, contact your health care provider. What can I expect after the procedure? After your procedure, it is common to have:  Mild discomfort and swelling in the pouch that holds your testicles (scrotum).  Bruising of the scrotum. Follow these instructions at home: Medicines  Take over-the-counter and prescription medicines only as told by your health care provider.  Ask your health care provider if the medicine prescribed to you: ? Requires you to avoid driving or using heavy machinery. ? Can cause constipation. You may need to take these actions to prevent or treat constipation:  Drink enough fluid to keep your urine pale yellow.  Take over-the-counter or prescription medicines.  Eat foods that are high in fiber, such as beans, whole grains, and fresh fruits and vegetables.  Limit foods that are high in fat and processed sugars, such as fried or sweet foods. Bathing  Do not take baths, swim, or use a hot tub until your health care provider approves. Ask your health care provider if you may take showers. You may only be allowed to take sponge baths.  If you were told to wear an athletic support strap (scrotal support), keep it dry. Take it off when you shower or bathe. Incision care   Follow instructions from your health care provider about how to take care of your incision. Make sure you: ? Wash your hands with soap and water before and after you change your bandage (dressing). If soap and water are not available, use hand sanitizer. ? Change your dressing as told by your health care provider. ? Leave stitches (sutures), skin glue, or adhesive strips in place. These skin closures may need to stay  in place for 2 weeks or longer. If adhesive strip edges start to loosen and curl up, you may trim the loose edges. Do not remove adhesive strips completely unless your health care provider tells you to do that.  Check your incision and scrotum every day for signs of infection. Check for: ? More redness, swelling, or pain. ? Fluid or blood. ? Warmth. ? Pus or a bad smell. Managing pain and swelling If directed, put ice on the affected area. To do this:  Put ice in a plastic bag.  Place a towel between your skin and the bag.  Leave the ice on for 20 minutes, 2-3 times per day.  Activity  Do not do any high-energy activities for as long as told by your health care provider.  Do not lift anything that is heavier than 10 lb (4.5 kg), or the limit that you are told, until your health care provider says that it is safe.  Return to your normal activities as told by your health care provider. Ask your health care provider what activities are safe for you.  Do not drive for 24 hours if you were given a sedative during your procedure.  Ask your health care provider when it is safe to drive. General instructions  Do not use any products that contain nicotine or tobacco, such as cigarettes, e-cigarettes, and chewing tobacco. These can delay incision healing after surgery. If you need help quitting, ask your health care provider.  If you were given a scrotal support, wear it as told by your health care provider.  If you   had a drain put in during the procedure, you will need to have it removed at a follow-up visit.  Keep all follow-up visits as told by your health care provider. This is important. Contact a health care provider if:  Your pain is not controlled with medicine.  You have more redness, swelling, or pain around your scrotum.  You have fluid or blood coming from your incision.  Your incision feels warm to the touch.  You have pus or a bad smell coming from your  scrotum.  You have a fever. Get help right away if:  You develop shaking, chills, and a fever that is higher than 101.8F (38.8C).  You have redness or swelling that starts at your scrotum and spreads outward to cover your whole groin.  You develop swelling of the legs or difficulty breathing. Summary  After a hydrocelectomy, it is common to have mild discomfort, swelling, and bruising.  Do not take baths, swim, or use a hot tub until your health care provider approves. Ask your health care provider if you may take showers.  If directed, put ice on the affected area to help with pain and swelling.  Do not do any high-energy activities or lift anything heavier than 10 lb (4.5 kg) for as long as told by your health care provider.  If you were given a scrotal support, keep it dry. Wear the scrotal support as told by your health care provider. This information is not intended to replace advice given to you by your health care provider. Make sure you discuss any questions you have with your health care provider. Document Revised: 01/31/2019 Document Reviewed: 01/31/2019 Elsevier Patient Education  2020 Elsevier Inc.  

## 2020-01-12 ENCOUNTER — Telehealth: Payer: Self-pay

## 2020-01-12 NOTE — Telephone Encounter (Signed)
Jermaine [name] called this am to make Dr. Alyson [name] aware that pt is currently under a pain contract. Dr. Alyson [name] was unaware that pt is currently taking Percocet 10/325mg  TID #90 dispensed on 4/19 and xtampza. Pt had appt on 01/10/20 with Dr. Alyson [name] for testicular pain- pain prescription was written by Dr. Alyson [name]. Relayed the message to Dr. Alyson [name] about pain contract pt has and [place], [place] at [facility] asking if medically necessary that it be filled. Dr. Alyson [name] denied need for pain prescription he had written since pt currently in pain management contract. Claudie, Melissa PA, notified to inform pharmacy of denial. Pharmacy had reached out to [facility] concerning the pain prescriptions.

## 2020-01-22 ENCOUNTER — Other Ambulatory Visit: Payer: Self-pay

## 2020-01-22 ENCOUNTER — Ambulatory Visit (INDEPENDENT_AMBULATORY_CARE_PROVIDER_SITE_OTHER): Payer: Medicaid Other | Admitting: Urology

## 2020-01-22 ENCOUNTER — Telehealth: Payer: Self-pay

## 2020-01-22 ENCOUNTER — Encounter: Payer: Self-pay | Admitting: Urology

## 2020-01-22 VITALS — BP 142/78 | HR 76 | Temp 97.9°F | Ht 74.0 in | Wt 250.0 lb

## 2020-01-22 DIAGNOSIS — N50812 Left testicular pain: Secondary | ICD-10-CM | POA: Diagnosis not present

## 2020-01-22 MED ORDER — OXYCODONE-ACETAMINOPHEN 10-325 MG PO TABS: 1.0000 | ORAL_TABLET | ORAL | 0 refills | Status: DC | PRN

## 2020-01-22 MED ORDER — CLINDAMYCIN HCL 300 MG PO CAPS: 300.0000 mg | ORAL_CAPSULE | Freq: Three times a day (TID) | ORAL | 0 refills | Status: DC

## 2020-01-22 MED ORDER — MELOXICAM 15 MG PO TBDP: 1.0000 | ORAL_TABLET | Freq: Every day | ORAL | 1 refills | Status: DC

## 2020-01-22 NOTE — Telephone Encounter (Signed)
Pt called the office after going to pharmacy to pick up prescriptions. Pt was able to get 2 out of 3 rx that Dr. Alyson Ingles wrote for. Per pt the Pharmacy will not fill the narcotic since pt is under a pain contract with pain management doctor. Informed pt to reach out to pain management doctor concerning narcotic issues with pharmacy since he is under pain contract. Pt voiced understanding.

## 2020-01-22 NOTE — Patient Instructions (Signed)
Skin Abscess  A skin abscess is an infected area on or under your skin that contains a collection of pus and other material. An abscess may also be called a furuncle, carbuncle, or boil. An abscess can occur in or on almost any part of your body. Some abscesses break open (rupture) on their own. Most continue to get worse unless they are treated. The infection can spread deeper into the body and eventually into your blood, which can make you feel ill. Treatment usually involves draining the abscess. What are the causes? An abscess occurs when germs, like bacteria, pass through your skin and cause an infection. This may be caused by:  A scrape or cut on your skin.  A puncture wound through your skin, including a needle injection or insect bite.  Blocked oil or sweat glands.  Blocked and infected hair follicles.  A cyst that forms beneath your skin (sebaceous cyst) and becomes infected. What increases the risk? This condition is more likely to develop in people who:  Have a weak body defense system (immune system).  Have diabetes.  Have dry and irritated skin.  Get frequent injections or use illegal IV drugs.  Have a foreign body in a wound, such as a splinter.  Have problems with their lymph system or veins. What are the signs or symptoms? Symptoms of this condition include:  A painful, firm bump under the skin.  A bump with pus at the top. This may break through the skin and drain. Other symptoms include:  Redness surrounding the abscess site.  Warmth.  Swelling of the lymph nodes (glands) near the abscess.  Tenderness.  A sore on the skin. How is this diagnosed? This condition may be diagnosed based on:  A physical exam.  Your medical history.  A sample of pus. This may be used to find out what is causing the infection.  Blood tests.  Imaging tests, such as an ultrasound, CT scan, or MRI. How is this treated? A small abscess that drains on its own may  not need treatment. Treatment for larger abscesses may include:  Moist heat or heat pack applied to the area several times a day.  A procedure to drain the abscess (incision and drainage).  Antibiotic medicines. For a severe abscess, you may first get antibiotics through an IV and then change to antibiotics by mouth. Follow these instructions at home: Medicines   Take over-the-counter and prescription medicines only as told by your health care provider.  If you were prescribed an antibiotic medicine, take it as told by your health care provider. Do not stop taking the antibiotic even if you start to feel better. Abscess care   If you have an abscess that has not drained, apply heat to the affected area. Use the heat source that your health care provider recommends, such as a moist heat pack or a heating pad. ? Place a towel between your skin and the heat source. ? Leave the heat on for 20-30 minutes. ? Remove the heat if your skin turns bright red. This is especially important if you are unable to feel pain, heat, or cold. You may have a greater risk of getting burned.  Follow instructions from your health care provider about how to take care of your abscess. Make sure you: ? Cover the abscess with a bandage (dressing). ? Change your dressing or gauze as told by your health care provider. ? Wash your hands with soap and water before you change the   dressing or gauze. If soap and water are not available, use hand sanitizer.  Check your abscess every day for signs of a worsening infection. Check for: ? More redness, swelling, or pain. ? More fluid or blood. ? Warmth. ? More pus or a bad smell. General instructions  To avoid spreading the infection: ? Do not share personal care items, towels, or hot tubs with others. ? Avoid making skin contact with other people.  Keep all follow-up visits as told by your health care provider. This is important. Contact a health care provider if  you have:  More redness, swelling, or pain around your abscess.  More fluid or blood coming from your abscess.  Warm skin around your abscess.  More pus or a bad smell coming from your abscess.  A fever.  Muscle aches.  Chills or a general ill feeling. Get help right away if you:  Have severe pain.  See red streaks on your skin spreading away from the abscess. Summary  A skin abscess is an infected area on or under your skin that contains a collection of pus and other material.  A small abscess that drains on its own may not need treatment.  Treatment for larger abscesses may include having a procedure to drain the abscess and taking an antibiotic. This information is not intended to replace advice given to you by your health care provider. Make sure you discuss any questions you have with your health care provider. Document Revised: 12/29/2018 Document Reviewed: 10/21/2017 Elsevier Patient Education  2020 Elsevier Inc.  
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
## 2020-01-22 NOTE — Progress Notes (Signed)

## 2020-01-24 ENCOUNTER — Other Ambulatory Visit: Payer: Self-pay | Admitting: Urology

## 2020-01-24 DIAGNOSIS — N50812 Left testicular pain: Secondary | ICD-10-CM

## 2020-01-28 ENCOUNTER — Emergency Department (HOSPITAL_COMMUNITY): Payer: Medicaid Other

## 2020-01-28 ENCOUNTER — Encounter (HOSPITAL_COMMUNITY): Payer: Self-pay | Admitting: Emergency Medicine

## 2020-01-28 ENCOUNTER — Other Ambulatory Visit: Payer: Self-pay

## 2020-01-28 ENCOUNTER — Inpatient Hospital Stay (HOSPITAL_COMMUNITY)
Admission: EM | Admit: 2020-01-28 | Discharge: 2020-01-29 | DRG: 137 | Disposition: A | Discharge status: Home / Self Care | Payer: Medicaid Other | Attending: Internal Medicine | Admitting: Internal Medicine

## 2020-01-28 DIAGNOSIS — L03211 Cellulitis of face: Secondary | ICD-10-CM | POA: Diagnosis present

## 2020-01-28 DIAGNOSIS — G4733 Obstructive sleep apnea (adult) (pediatric): Secondary | ICD-10-CM | POA: Diagnosis present

## 2020-01-28 DIAGNOSIS — Z833 Family history of diabetes mellitus: Secondary | ICD-10-CM

## 2020-01-28 DIAGNOSIS — Z8249 Family history of ischemic heart disease and other diseases of the circulatory system: Secondary | ICD-10-CM | POA: Diagnosis not present

## 2020-01-28 DIAGNOSIS — E119 Type 2 diabetes mellitus without complications: Secondary | ICD-10-CM | POA: Diagnosis present

## 2020-01-28 DIAGNOSIS — K047 Periapical abscess without sinus: Secondary | ICD-10-CM | POA: Diagnosis present

## 2020-01-28 DIAGNOSIS — N529 Male erectile dysfunction, unspecified: Secondary | ICD-10-CM | POA: Diagnosis present

## 2020-01-28 DIAGNOSIS — K029 Dental caries, unspecified: Secondary | ICD-10-CM | POA: Diagnosis present

## 2020-01-28 DIAGNOSIS — F1120 Opioid dependence, uncomplicated: Secondary | ICD-10-CM | POA: Diagnosis present

## 2020-01-28 DIAGNOSIS — F603 Borderline personality disorder: Secondary | ICD-10-CM | POA: Diagnosis present

## 2020-01-28 DIAGNOSIS — F17210 Nicotine dependence, cigarettes, uncomplicated: Secondary | ICD-10-CM | POA: Diagnosis present

## 2020-01-28 DIAGNOSIS — F332 Major depressive disorder, recurrent severe without psychotic features: Secondary | ICD-10-CM | POA: Diagnosis not present

## 2020-01-28 DIAGNOSIS — F1129 Opioid dependence with unspecified opioid-induced disorder: Secondary | ICD-10-CM | POA: Diagnosis not present

## 2020-01-28 DIAGNOSIS — F419 Anxiety disorder, unspecified: Secondary | ICD-10-CM | POA: Diagnosis present

## 2020-01-28 DIAGNOSIS — R569 Unspecified convulsions: Secondary | ICD-10-CM | POA: Diagnosis present

## 2020-01-28 DIAGNOSIS — M272 Inflammatory conditions of jaws: Principal | ICD-10-CM | POA: Diagnosis present

## 2020-01-28 DIAGNOSIS — Z885 Allergy status to narcotic agent status: Secondary | ICD-10-CM

## 2020-01-28 DIAGNOSIS — F319 Bipolar disorder, unspecified: Secondary | ICD-10-CM | POA: Diagnosis present

## 2020-01-28 DIAGNOSIS — I2510 Atherosclerotic heart disease of native coronary artery without angina pectoris: Secondary | ICD-10-CM | POA: Diagnosis present

## 2020-01-28 DIAGNOSIS — K219 Gastro-esophageal reflux disease without esophagitis: Secondary | ICD-10-CM | POA: Diagnosis present

## 2020-01-28 DIAGNOSIS — J189 Pneumonia, unspecified organism: Secondary | ICD-10-CM | POA: Diagnosis present

## 2020-01-28 DIAGNOSIS — Z20822 Contact with and (suspected) exposure to covid-19: Secondary | ICD-10-CM | POA: Diagnosis present

## 2020-01-28 DIAGNOSIS — I10 Essential (primary) hypertension: Secondary | ICD-10-CM | POA: Diagnosis present

## 2020-01-28 DIAGNOSIS — Z720 Tobacco use: Secondary | ICD-10-CM | POA: Diagnosis present

## 2020-01-28 DIAGNOSIS — Z79891 Long term (current) use of opiate analgesic: Secondary | ICD-10-CM

## 2020-01-28 DIAGNOSIS — Z79899 Other long term (current) drug therapy: Secondary | ICD-10-CM

## 2020-01-28 DIAGNOSIS — Z79890 Hormone replacement therapy (postmenopausal): Secondary | ICD-10-CM | POA: Diagnosis not present

## 2020-01-28 DIAGNOSIS — F339 Major depressive disorder, recurrent, unspecified: Secondary | ICD-10-CM | POA: Diagnosis present

## 2020-01-28 DIAGNOSIS — Z7984 Long term (current) use of oral hypoglycemic drugs: Secondary | ICD-10-CM | POA: Diagnosis not present

## 2020-01-28 HISTORY — DX: Inflammatory conditions of jaws: M27.2

## 2020-01-28 LAB — COMPREHENSIVE METABOLIC PANEL
ALT: 19 U/L (ref 0–44)
AST: 22 U/L (ref 15–41)
Albumin: 3.9 g/dL (ref 3.5–5.0)
Alkaline Phosphatase: 74 U/L (ref 38–126)
Anion gap: 10 (ref 5–15)
BUN: 9 mg/dL (ref 6–20)
CO2: 24 mmol/L (ref 22–32)
Calcium: 8.7 mg/dL — ABNORMAL LOW (ref 8.9–10.3)
Chloride: 101 mmol/L (ref 98–111)
Creatinine, Ser: 0.95 mg/dL (ref 0.61–1.24)
GFR calc Af Amer: >60 mL/min (ref >60)
GFR calc non Af Amer: >60 mL/min (ref >60)
Glucose, Bld: 87 mg/dL (ref 70–99)
Potassium: 3.7 mmol/L (ref 3.5–5.1)
Sodium: 135 mmol/L (ref 135–145)
Total Bilirubin: 0.5 mg/dL (ref 0.3–1.2)
Total Protein: 6.9 g/dL (ref 6.5–8.1)

## 2020-01-28 LAB — CBC WITH DIFFERENTIAL/PLATELET
Abs Immature Granulocytes: 0.06 10*3/uL (ref 0.00–0.07)
Basophils Absolute: 0 10*3/uL (ref 0.0–0.1)
Basophils Relative: 0 %
Eosinophils Absolute: 0.3 10*3/uL (ref 0.0–0.5)
Eosinophils Relative: 3 %
HCT: 36.6 % — ABNORMAL LOW (ref 39.0–52.0)
Hemoglobin: 12.2 g/dL — ABNORMAL LOW (ref 13.0–17.0)
Immature Granulocytes: 1 %
Lymphocytes Relative: 18 %
Lymphs Abs: 2.1 10*3/uL (ref 0.7–4.0)
MCH: 33.2 pg (ref 26.0–34.0)
MCHC: 33.3 g/dL (ref 30.0–36.0)
MCV: 99.5 fL (ref 80.0–100.0)
Monocytes Absolute: 0.9 10*3/uL (ref 0.1–1.0)
Monocytes Relative: 8 %
Neutro Abs: 8.1 10*3/uL — ABNORMAL HIGH (ref 1.7–7.7)
Neutrophils Relative %: 70 %
Platelets: 197 10*3/uL (ref 150–400)
RBC: 3.68 MIL/uL — ABNORMAL LOW (ref 4.22–5.81)
RDW: 13.2 % (ref 11.5–15.5)
WBC: 11.5 10*3/uL — ABNORMAL HIGH (ref 4.0–10.5)
nRBC: 0 % (ref 0.0–0.2)

## 2020-01-28 LAB — CBG MONITORING, ED: Glucose-Capillary: 104 mg/dL — ABNORMAL HIGH (ref 70–99)

## 2020-01-28 LAB — RESPIRATORY PANEL BY RT PCR (FLU A&B, COVID)
Influenza A by PCR: NEGATIVE
Influenza B by PCR: NEGATIVE
SARS Coronavirus 2 by RT PCR: NEGATIVE

## 2020-01-28 LAB — GLUCOSE, CAPILLARY: Glucose-Capillary: 121 mg/dL — ABNORMAL HIGH (ref 70–99)

## 2020-01-28 MED ORDER — ONDANSETRON HCL 4 MG/2ML IJ SOLN: 4.0000 mg | Freq: Four times a day (QID) | INTRAMUSCULAR | Status: DC | PRN

## 2020-01-28 MED ORDER — INSULIN ASPART 100 UNIT/ML ~~LOC~~ SOLN: 0.0000 [IU] | Freq: Three times a day (TID) | SUBCUTANEOUS | Status: DC

## 2020-01-28 MED ORDER — LITHIUM CARBONATE 300 MG PO CAPS
300.0000 mg | ORAL_CAPSULE | Freq: Once | ORAL | Status: AC
  Administered 2020-01-28: 17:00:00 300 mg via ORAL
  Filled 2020-01-28: qty 1

## 2020-01-28 MED ORDER — GABAPENTIN 600 MG PO TABS
600.0000 mg | ORAL_TABLET | Freq: Three times a day (TID) | ORAL | Status: DC
  Administered 2020-01-28 – 2020-01-29 (×3): 600 mg via ORAL
  Filled 2020-01-28 (×3): qty 1

## 2020-01-28 MED ORDER — CYCLOBENZAPRINE HCL 10 MG PO TABS
10.0000 mg | ORAL_TABLET | Freq: Three times a day (TID) | ORAL | Status: DC
  Administered 2020-01-28 – 2020-01-29 (×3): 10 mg via ORAL
  Filled 2020-01-28 (×3): qty 1

## 2020-01-28 MED ORDER — BUSPIRONE HCL 15 MG PO TABS
7.5000 mg | ORAL_TABLET | Freq: Three times a day (TID) | ORAL | Status: DC
  Administered 2020-01-28 – 2020-01-29 (×3): 7.5 mg via ORAL
  Filled 2020-01-28 (×4): qty 1

## 2020-01-28 MED ORDER — VENLAFAXINE HCL ER 75 MG PO CP24
75.0000 mg | ORAL_CAPSULE | Freq: Every day | ORAL | Status: DC
  Administered 2020-01-29: 07:00:00 75 mg via ORAL
  Filled 2020-01-28 (×2): qty 1

## 2020-01-28 MED ORDER — OXYCODONE HCL 5 MG PO TABS
5.0000 mg | ORAL_TABLET | Freq: Three times a day (TID) | ORAL | Status: DC | PRN
  Administered 2020-01-29 (×3): 5 mg via ORAL
  Filled 2020-01-28 (×3): qty 1

## 2020-01-28 MED ORDER — LAMOTRIGINE 100 MG PO TABS
200.0000 mg | ORAL_TABLET | Freq: Every day | ORAL | Status: DC
  Administered 2020-01-28 – 2020-01-29 (×2): 200 mg via ORAL
  Filled 2020-01-28 (×2): qty 2

## 2020-01-28 MED ORDER — CLONIDINE HCL 0.1 MG PO TABS
0.1000 mg | ORAL_TABLET | Freq: Three times a day (TID) | ORAL | Status: DC
  Administered 2020-01-28 – 2020-01-29 (×3): 0.1 mg via ORAL
  Filled 2020-01-28 (×3): qty 1

## 2020-01-28 MED ORDER — OXYCODONE-ACETAMINOPHEN 5-325 MG PO TABS
1.0000 | ORAL_TABLET | Freq: Three times a day (TID) | ORAL | Status: DC | PRN
  Administered 2020-01-29 (×3): 1 via ORAL
  Filled 2020-01-28 (×3): qty 1

## 2020-01-28 MED ORDER — LITHIUM CARBONATE 300 MG PO CAPS: 300.0000 mg | ORAL_CAPSULE | Freq: Two times a day (BID) | ORAL | Status: DC

## 2020-01-28 MED ORDER — OXYCODONE HCL ER 15 MG PO T12A
15.0000 mg | EXTENDED_RELEASE_TABLET | Freq: Two times a day (BID) | ORAL | Status: DC
  Administered 2020-01-28 – 2020-01-29 (×2): 15 mg via ORAL
  Filled 2020-01-28 (×2): qty 1

## 2020-01-28 MED ORDER — ATENOLOL 50 MG PO TABS
50.0000 mg | ORAL_TABLET | Freq: Every day | ORAL | Status: DC
  Administered 2020-01-29: 08:00:00 50 mg via ORAL
  Filled 2020-01-28: qty 1

## 2020-01-28 MED ORDER — MORPHINE SULFATE (PF) 2 MG/ML IV SOLN
2.0000 mg | Freq: Once | INTRAVENOUS | Status: AC
  Administered 2020-01-28: 17:00:00 2 mg via INTRAVENOUS
  Filled 2020-01-28: qty 1

## 2020-01-28 MED ORDER — LITHIUM CARBONATE 300 MG PO CAPS
600.0000 mg | ORAL_CAPSULE | Freq: Every day | ORAL | Status: DC
  Administered 2020-01-28: 22:00:00 600 mg via ORAL
  Filled 2020-01-28 (×2): qty 2

## 2020-01-28 MED ORDER — LITHIUM CARBONATE 300 MG PO CAPS
300.0000 mg | ORAL_CAPSULE | Freq: Every day | ORAL | Status: DC
  Administered 2020-01-29: 300 mg via ORAL
  Filled 2020-01-28: qty 1

## 2020-01-28 MED ORDER — BENZTROPINE MESYLATE 1 MG PO TABS
1.0000 mg | ORAL_TABLET | Freq: Every day | ORAL | Status: DC
  Administered 2020-01-28: 22:00:00 1 mg via ORAL
  Filled 2020-01-28 (×2): qty 1

## 2020-01-28 MED ORDER — AMLODIPINE BESYLATE 5 MG PO TABS
5.0000 mg | ORAL_TABLET | Freq: Every day | ORAL | Status: DC
  Administered 2020-01-29: 08:00:00 5 mg via ORAL
  Filled 2020-01-28: qty 1

## 2020-01-28 MED ORDER — LEVOTHYROXINE SODIUM 50 MCG PO TABS
50.0000 ug | ORAL_TABLET | Freq: Every day | ORAL | Status: DC
  Administered 2020-01-29: 05:00:00 50 ug via ORAL
  Filled 2020-01-28: qty 1

## 2020-01-28 MED ORDER — OXCARBAZEPINE 150 MG PO TABS
150.0000 mg | ORAL_TABLET | Freq: Two times a day (BID) | ORAL | Status: DC
  Administered 2020-01-28 – 2020-01-29 (×2): 150 mg via ORAL
  Filled 2020-01-28 (×3): qty 1

## 2020-01-28 MED ORDER — KETOROLAC TROMETHAMINE 30 MG/ML IJ SOLN
30.0000 mg | Freq: Once | INTRAMUSCULAR | Status: AC
  Administered 2020-01-28: 30 mg via INTRAVENOUS
  Filled 2020-01-28: qty 1

## 2020-01-28 MED ORDER — LINACLOTIDE 145 MCG PO CAPS
290.0000 ug | ORAL_CAPSULE | Freq: Every day | ORAL | Status: DC | PRN
  Filled 2020-01-28: qty 2

## 2020-01-28 MED ORDER — SIMVASTATIN 20 MG PO TABS
20.0000 mg | ORAL_TABLET | Freq: Every day | ORAL | Status: DC
  Administered 2020-01-28 – 2020-01-29 (×2): 20 mg via ORAL
  Filled 2020-01-28 (×2): qty 1

## 2020-01-28 MED ORDER — MORPHINE SULFATE (PF) 2 MG/ML IV SOLN
2.0000 mg | INTRAVENOUS | Status: DC | PRN
  Administered 2020-01-28 – 2020-01-29 (×5): 2 mg via INTRAVENOUS
  Filled 2020-01-28 (×5): qty 1

## 2020-01-28 MED ORDER — CLINDAMYCIN PHOSPHATE 600 MG/50ML IV SOLN
600.0000 mg | Freq: Once | INTRAVENOUS | Status: AC
  Administered 2020-01-28: 15:00:00 600 mg via INTRAVENOUS
  Filled 2020-01-28: qty 50

## 2020-01-28 MED ORDER — OXYCODONE-ACETAMINOPHEN 10-325 MG PO TABS: 1.0000 | ORAL_TABLET | Freq: Three times a day (TID) | ORAL | Status: DC | PRN

## 2020-01-28 MED ORDER — ONDANSETRON HCL 4 MG PO TABS: 4.0000 mg | ORAL_TABLET | Freq: Four times a day (QID) | ORAL | Status: DC | PRN

## 2020-01-28 MED ORDER — IOHEXOL 300 MG/ML  SOLN
75.0000 mL | Freq: Once | INTRAMUSCULAR | Status: AC | PRN
  Administered 2020-01-28: 75 mL via INTRAVENOUS

## 2020-01-28 MED ORDER — FENOFIBRATE 160 MG PO TABS
160.0000 mg | ORAL_TABLET | Freq: Every day | ORAL | Status: DC
  Administered 2020-01-29: 08:00:00 160 mg via ORAL
  Filled 2020-01-28: qty 1

## 2020-01-28 MED ORDER — INSULIN ASPART 100 UNIT/ML ~~LOC~~ SOLN: 0.0000 [IU] | Freq: Every day | SUBCUTANEOUS | Status: DC

## 2020-01-28 MED ORDER — BREXPIPRAZOLE 1 MG PO TABS
1.0000 mg | ORAL_TABLET | Freq: Every day | ORAL | Status: DC
  Administered 2020-01-28: 22:00:00 1 mg via ORAL
  Filled 2020-01-28 (×2): qty 1

## 2020-01-28 MED ORDER — SODIUM CHLORIDE 0.9 % IV SOLN
3.0000 g | Freq: Four times a day (QID) | INTRAVENOUS | Status: DC
  Administered 2020-01-28 – 2020-01-29 (×4): 3 g via INTRAVENOUS
  Filled 2020-01-28 (×6): qty 8
  Filled 2020-01-28: qty 3

## 2020-01-28 MED ORDER — SODIUM CHLORIDE 0.9 % IV SOLN: INTRAVENOUS | Status: DC

## 2020-01-28 MED ORDER — MELOXICAM 7.5 MG PO TABS
15.0000 mg | ORAL_TABLET | Freq: Every day | ORAL | Status: DC
  Administered 2020-01-29: 07:00:00 15 mg via ORAL
  Filled 2020-01-28 (×2): qty 2

## 2020-01-28 MED ORDER — HYDROXYZINE HCL 25 MG PO TABS
25.0000 mg | ORAL_TABLET | Freq: Three times a day (TID) | ORAL | Status: DC
  Administered 2020-01-28: 50 mg via ORAL
  Administered 2020-01-29 (×2): 25 mg via ORAL
  Filled 2020-01-28 (×2): qty 1
  Filled 2020-01-28: qty 2

## 2020-01-28 MED ORDER — PANTOPRAZOLE SODIUM 40 MG IV SOLR
40.0000 mg | Freq: Two times a day (BID) | INTRAVENOUS | Status: DC
  Administered 2020-01-28 – 2020-01-29 (×2): 40 mg via INTRAVENOUS
  Filled 2020-01-28 (×2): qty 40

## 2020-01-28 NOTE — ED Notes (Signed)
carelink transport to [employer]
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
## 2020-01-28 NOTE — ED Notes (Signed)
Spoke with Phil at [facility] will send transportation at this time to [facility].

## 2020-01-28 NOTE — ED Triage Notes (Signed)
Has been on Amoxicillin for the last 3 weeks since tooth broke   Now with swelling to his R side of face  Drinking pepsi in triage

## 2020-01-28 NOTE — ED Notes (Signed)
Called [facility] spoke with Ruby to set up transportation at this time.

## 2020-01-29 ENCOUNTER — Encounter (HOSPITAL_COMMUNITY)
Admission: EM | Disposition: A | Discharge status: Home / Self Care | Payer: Self-pay | Source: Home / Self Care | Attending: Internal Medicine

## 2020-01-29 ENCOUNTER — Encounter (HOSPITAL_COMMUNITY): Payer: Self-pay | Admitting: Internal Medicine

## 2020-01-29 ENCOUNTER — Inpatient Hospital Stay (HOSPITAL_COMMUNITY): Payer: Medicaid Other | Admitting: Anesthesiology

## 2020-01-29 DIAGNOSIS — M272 Inflammatory conditions of jaws: Principal | ICD-10-CM

## 2020-01-29 DIAGNOSIS — J189 Pneumonia, unspecified organism: Secondary | ICD-10-CM | POA: Diagnosis present

## 2020-01-29 DIAGNOSIS — E119 Type 2 diabetes mellitus without complications: Secondary | ICD-10-CM

## 2020-01-29 HISTORY — PX: MULTIPLE EXTRACTIONS WITH ALVEOLOPLASTY: SHX5342

## 2020-01-29 LAB — CBC
HCT: 34 % — ABNORMAL LOW (ref 39.0–52.0)
Hemoglobin: 11.5 g/dL — ABNORMAL LOW (ref 13.0–17.0)
MCH: 33.4 pg (ref 26.0–34.0)
MCHC: 33.8 g/dL (ref 30.0–36.0)
MCV: 98.8 fL (ref 80.0–100.0)
Platelets: 197 10*3/uL (ref 150–400)
RBC: 3.44 MIL/uL — ABNORMAL LOW (ref 4.22–5.81)
RDW: 13.2 % (ref 11.5–15.5)
WBC: 8.2 10*3/uL (ref 4.0–10.5)
nRBC: 0 % (ref 0.0–0.2)

## 2020-01-29 LAB — BASIC METABOLIC PANEL
Anion gap: 6 (ref 5–15)
BUN: 5 mg/dL — ABNORMAL LOW (ref 6–20)
CO2: 25 mmol/L (ref 22–32)
Calcium: 8 mg/dL — ABNORMAL LOW (ref 8.9–10.3)
Chloride: 108 mmol/L (ref 98–111)
Creatinine, Ser: 0.99 mg/dL (ref 0.61–1.24)
GFR calc Af Amer: >60 mL/min (ref >60)
GFR calc non Af Amer: >60 mL/min (ref >60)
Glucose, Bld: 94 mg/dL (ref 70–99)
Potassium: 3.8 mmol/L (ref 3.5–5.1)
Sodium: 139 mmol/L (ref 135–145)

## 2020-01-29 LAB — GLUCOSE, CAPILLARY
Glucose-Capillary: 92 mg/dL (ref 70–99)
Glucose-Capillary: 92 mg/dL (ref 70–99)
Glucose-Capillary: 72 mg/dL (ref 70–99)
Glucose-Capillary: 88 mg/dL (ref 70–99)

## 2020-01-29 SURGERY — MULTIPLE EXTRACTION WITH ALVEOLOPLASTY
Anesthesia: General | Site: Mouth | Laterality: Right

## 2020-01-29 MED ORDER — OXYCODONE-ACETAMINOPHEN 10-325 MG PO TABS: 1.0000 | ORAL_TABLET | ORAL | 0 refills | Status: DC | PRN

## 2020-01-29 MED ORDER — LIDOCAINE-EPINEPHRINE 2 %-1:100000 IJ SOLN
INTRAMUSCULAR | Status: AC
  Filled 2020-01-29: qty 1

## 2020-01-29 MED ORDER — ACETAMINOPHEN 500 MG PO TABS: 1000.0000 mg | ORAL_TABLET | Freq: Once | ORAL | Status: DC

## 2020-01-29 MED ORDER — 0.9 % SODIUM CHLORIDE (POUR BTL) OPTIME
TOPICAL | Status: DC | PRN
  Administered 2020-01-29: 15:00:00 1000 mL

## 2020-01-29 MED ORDER — ROCURONIUM BROMIDE 10 MG/ML (PF) SYRINGE
PREFILLED_SYRINGE | INTRAVENOUS | Status: DC | PRN
  Administered 2020-01-29: 20 mg via INTRAVENOUS
  Administered 2020-01-29: 10 mg via INTRAVENOUS

## 2020-01-29 MED ORDER — CHLORHEXIDINE GLUCONATE CLOTH 2 % EX PADS
6.0000 | MEDICATED_PAD | Freq: Once | CUTANEOUS | Status: AC
  Administered 2020-01-29: 14:00:00 6 via TOPICAL

## 2020-01-29 MED ORDER — FENTANYL CITRATE (PF) 100 MCG/2ML IJ SOLN: 25.0000 ug | INTRAMUSCULAR | Status: DC | PRN

## 2020-01-29 MED ORDER — ONDANSETRON HCL 4 MG/2ML IJ SOLN
INTRAMUSCULAR | Status: DC | PRN
  Administered 2020-01-29: 4 mg via INTRAVENOUS

## 2020-01-29 MED ORDER — LACTATED RINGERS IV SOLN: INTRAVENOUS | Status: DC

## 2020-01-29 MED ORDER — FENTANYL CITRATE (PF) 250 MCG/5ML IJ SOLN
INTRAMUSCULAR | Status: DC | PRN
  Administered 2020-01-29: 50 ug via INTRAVENOUS
  Administered 2020-01-29 (×2): 100 ug via INTRAVENOUS

## 2020-01-29 MED ORDER — MIDAZOLAM HCL 2 MG/2ML IJ SOLN
INTRAMUSCULAR | Status: AC
  Filled 2020-01-29: qty 2

## 2020-01-29 MED ORDER — PROPOFOL 10 MG/ML IV BOLUS
INTRAVENOUS | Status: DC | PRN
  Administered 2020-01-29: 150 mg via INTRAVENOUS

## 2020-01-29 MED ORDER — ONDANSETRON HCL 4 MG/2ML IJ SOLN
INTRAMUSCULAR | Status: AC
  Filled 2020-01-29: qty 2

## 2020-01-29 MED ORDER — LIDOCAINE-EPINEPHRINE 2 %-1:100000 IJ SOLN
INTRAMUSCULAR | Status: DC | PRN
  Administered 2020-01-29: 8 mL via INTRADERMAL

## 2020-01-29 MED ORDER — OXYMETAZOLINE HCL 0.05 % NA SOLN
NASAL | Status: DC | PRN
  Administered 2020-01-29: 2

## 2020-01-29 MED ORDER — AMOXICILLIN-POT CLAVULANATE 875-125 MG PO TABS
1.0000 | ORAL_TABLET | Freq: Two times a day (BID) | ORAL | Status: DC
  Administered 2020-01-29: 18:00:00 1 via ORAL
  Filled 2020-01-29 (×2): qty 1

## 2020-01-29 MED ORDER — LIDOCAINE 2% (20 MG/ML) 5 ML SYRINGE
INTRAMUSCULAR | Status: AC
  Filled 2020-01-29: qty 5

## 2020-01-29 MED ORDER — PHENYLEPHRINE 40 MCG/ML (10ML) SYRINGE FOR IV PUSH (FOR BLOOD PRESSURE SUPPORT)
PREFILLED_SYRINGE | INTRAVENOUS | Status: AC
  Filled 2020-01-29: qty 10

## 2020-01-29 MED ORDER — ARTIFICIAL TEARS OPHTHALMIC OINT
TOPICAL_OINTMENT | OPHTHALMIC | Status: AC
  Filled 2020-01-29: qty 3.5

## 2020-01-29 MED ORDER — SODIUM CHLORIDE 0.9 % IV SOLN
INTRAVENOUS | Status: AC | PRN
  Administered 2020-01-29: 1000 mL via INTRAMUSCULAR

## 2020-01-29 MED ORDER — LIDOCAINE 2% (20 MG/ML) 5 ML SYRINGE
INTRAMUSCULAR | Status: DC | PRN
  Administered 2020-01-29: 100 mg via INTRAVENOUS

## 2020-01-29 MED ORDER — ROCURONIUM BROMIDE 10 MG/ML (PF) SYRINGE
PREFILLED_SYRINGE | INTRAVENOUS | Status: AC
  Filled 2020-01-29: qty 10

## 2020-01-29 MED ORDER — NICOTINE 21 MG/24HR TD PT24
21.0000 mg | MEDICATED_PATCH | Freq: Every day | TRANSDERMAL | Status: DC
  Administered 2020-01-29: 10:00:00 21 mg via TRANSDERMAL
  Filled 2020-01-29: qty 1

## 2020-01-29 MED ORDER — OXYMETAZOLINE HCL 0.05 % NA SOLN
NASAL | Status: AC
  Filled 2020-01-29: qty 30

## 2020-01-29 MED ORDER — SUCCINYLCHOLINE CHLORIDE 20 MG/ML IJ SOLN
INTRAMUSCULAR | Status: DC | PRN
  Administered 2020-01-29: 200 mg via INTRAVENOUS

## 2020-01-29 MED ORDER — FENTANYL CITRATE (PF) 250 MCG/5ML IJ SOLN
INTRAMUSCULAR | Status: AC
  Filled 2020-01-29: qty 5

## 2020-01-29 MED ORDER — OXYMETAZOLINE HCL 0.05 % NA SOLN
NASAL | Status: DC | PRN
  Administered 2020-01-29: 2 via NASAL

## 2020-01-29 MED ORDER — AMOXICILLIN-POT CLAVULANATE 875-125 MG PO TABS
1.0000 | ORAL_TABLET | Freq: Two times a day (BID) | ORAL | 0 refills | Status: AC
Start: 2020-01-29 — End: 2020-02-08

## 2020-01-29 MED ORDER — SUGAMMADEX SODIUM 200 MG/2ML IV SOLN
INTRAVENOUS | Status: DC | PRN
  Administered 2020-01-29: 200 mg via INTRAVENOUS

## 2020-01-29 MED ORDER — PROPOFOL 10 MG/ML IV BOLUS
INTRAVENOUS | Status: AC
  Filled 2020-01-29: qty 40

## 2020-01-29 MED ORDER — SUCCINYLCHOLINE CHLORIDE 200 MG/10ML IV SOSY
PREFILLED_SYRINGE | INTRAVENOUS | Status: AC
  Filled 2020-01-29: qty 10

## 2020-01-29 MED ORDER — CHLORHEXIDINE GLUCONATE CLOTH 2 % EX PADS: 6.0000 | MEDICATED_PAD | Freq: Once | CUTANEOUS | Status: DC

## 2020-01-29 MED ORDER — LACTATED RINGERS IV SOLN: INTRAVENOUS | Status: DC | PRN

## 2020-01-29 MED ORDER — MIDAZOLAM HCL 5 MG/5ML IJ SOLN
INTRAMUSCULAR | Status: DC | PRN
  Administered 2020-01-29: 2 mg via INTRAVENOUS

## 2020-01-29 SURGICAL SUPPLY — 46 items
BUR CROSS CUT FISSURE 1.6 (BURR) ×2 IMPLANT
BUR CROSS CUT FISSURE 1.6MM (BURR) ×1
BUR EGG ELITE 4.0 (BURR) ×1 IMPLANT
BUR EGG ELITE 4.0MM (BURR)
CANISTER SUCT 3000ML PPV (MISCELLANEOUS) ×3 IMPLANT
COVER SURGICAL LIGHT HANDLE (MISCELLANEOUS) ×3 IMPLANT
COVER WAND RF STERILE (DRAPES) ×1 IMPLANT
DECANTER SPIKE VIAL GLASS SM (MISCELLANEOUS) IMPLANT
DRAPE U-SHAPE 76X120 STRL (DRAPES) ×3 IMPLANT
GAUZE PACKING FOLDED 2  STR (GAUZE/BANDAGES/DRESSINGS) ×3
GAUZE PACKING FOLDED 2 STR (GAUZE/BANDAGES/DRESSINGS) ×1 IMPLANT
GLOVE BIO SURGEON STRL SZ 6.5 (GLOVE) IMPLANT
GLOVE BIO SURGEON STRL SZ7.5 (GLOVE) ×3 IMPLANT
GLOVE BIO SURGEONS STRL SZ 6.5 (GLOVE)
GLOVE BIOGEL PI IND STRL 6.5 (GLOVE) IMPLANT
GLOVE BIOGEL PI IND STRL 7.0 (GLOVE) IMPLANT
GLOVE BIOGEL PI INDICATOR 6.5 (GLOVE)
GLOVE BIOGEL PI INDICATOR 7.0 (GLOVE)
GOWN STRL REUS W/ TWL LRG LVL3 (GOWN DISPOSABLE) ×1 IMPLANT
GOWN STRL REUS W/ TWL XL LVL3 (GOWN DISPOSABLE) ×1 IMPLANT
GOWN STRL REUS W/TWL LRG LVL3 (GOWN DISPOSABLE) ×6
GOWN STRL REUS W/TWL XL LVL3 (GOWN DISPOSABLE) ×3
IV NS 1000ML (IV SOLUTION) ×3
IV NS 1000ML BAXH (IV SOLUTION) ×1 IMPLANT
KIT BASIN OR (CUSTOM PROCEDURE TRAY) ×3 IMPLANT
KIT TURNOVER KIT B (KITS) ×3 IMPLANT
NDL HYPO 25GX1X1/2 BEV (NEEDLE) ×2 IMPLANT
NDL PRECISIONGLIDE 27X1.5 (NEEDLE) IMPLANT
NEEDLE HYPO 25GX1X1/2 BEV (NEEDLE) ×3 IMPLANT
NEEDLE PRECISIONGLIDE 27X1.5 (NEEDLE) IMPLANT
NS IRRIG 1000ML POUR BTL (IV SOLUTION) ×3 IMPLANT
PAD ARMBOARD 7.5X6 YLW CONV (MISCELLANEOUS) ×3 IMPLANT
POSITIONER HEAD DONUT 9IN (MISCELLANEOUS) ×3 IMPLANT
SLEEVE IRRIGATION ELITE 7 (MISCELLANEOUS) ×3 IMPLANT
SPONGE SURGIFOAM ABS GEL 12-7 (HEMOSTASIS) IMPLANT
SUCTION FRAZIER HANDLE 10FR (MISCELLANEOUS) ×3
SUCTION TUBE FRAZIER 10FR DISP (MISCELLANEOUS) IMPLANT
SUT CHROMIC 3 0 PS 2 (SUTURE) ×5 IMPLANT
SUT ETHILON 4 0 PS 2 18 (SUTURE) ×2 IMPLANT
SUT SILK 3-0 (SUTURE) ×3
SUT SILK 3-0 RB1 30XBRD (SUTURE) ×1
SUTURE SILK 3-0 RB1 30XBRD (SUTURE) IMPLANT
SYR CONTROL 10ML LL (SYRINGE) ×3 IMPLANT
TRAY ENT MC OR (CUSTOM PROCEDURE TRAY) ×3 IMPLANT
TUBING IRRIGATION (MISCELLANEOUS) ×3 IMPLANT
YANKAUER SUCT BULB TIP NO VENT (SUCTIONS) ×3 IMPLANT

## 2020-01-29 NOTE — Transfer of Care (Signed)
Immediate Anesthesia Transfer of Care Note  Patient: Jermaine [name]  Procedure(s) Performed: TOOTH EXTRACTION WITH IRRIGATION AND DEBRIDEMENT RIGHT MANDIBLE. (Right Mouth)  Patient Location: PACU  Anesthesia Type:General  Level of Consciousness: drowsy  Airway & Oxygen Therapy: Patient Spontanous Breathing and Patient connected to face mask oxygen  Post-op Assessment: Report given to RN and Post -op Vital signs reviewed and stable  Post vital signs: Reviewed and stable  Last Vitals:  Vitals Value Taken Time  BP 102/61 01/29/20 1641  Temp    Pulse 80 01/29/20 1642  Resp 19 01/29/20 1642  SpO2 97 % 01/29/20 1642  Vitals shown include unvalidated device data.  Last Pain:  Vitals:   01/29/20 0916  TempSrc:   PainSc: 7       Patients Stated Pain Goal: 2 ([id number])  Complications: No apparent anesthesia complications

## 2020-01-29 NOTE — Discharge Summary (Addendum)
Physician Discharge Summary  Jermaine [name] [id number] DOB: 01-Dec-1980 DOA: 01/28/2020  PCP: [name], MD  Admit date: 01/28/2020 Discharge date: 01/29/2020  Admitted From: Home Discharge disposition: Home   Recommendations for Outpatient Follow-Up:   1. Patient can use ibuprofen in between his pain medication from home.   2. Patient encouraged to finish antibiotics   Discharge Diagnosis:   Principal Problem:   Mandibular abscess Active Problems:   Seizure-like activity (HCC)   GERD (gastroesophageal reflux disease)   Opioid dependence ([place])   Tobacco abuse   Hypertension   Major depressive disorder, recurrent episode ([place])   Bipolar 1 disorder ([place])   Diabetes mellitus without complication ([place])   Pneumonia    Discharge Condition: Improved.  Diet recommendation: Soft diet   Code status: Full.   History of Present Illness:   Jermaine [name] is a 39 y.o. male with a history of bipolar, borderline personality disorder, CAD, DM2, GERD, HTN, Seizure. Seen for 3 weeks of worsening dental infections.  Patient has been on amoxicillin and clindamycin without improvement.  This morning, patient woke up with the right side of his face swollen, increased pain with difficulty opening his jaw.  Patient came for evaluation, particular because he was unable to take his medications no palliating or provoking factors   Hospital Course by Problem:   #1.  Mandibular abscess likely related to tooth decay.  CT reveals prominent periapical lucency surrounding the right mandibular first molar with associated inferomedial cortical breakthrough.  Immediately adjacent there is a 1.3 cm abscess.  Extensive surrounding right perimandibular phlegmon as well as prominent right perimandibular cellulitis.  He was given oral antibiotics in the outpatient setting.  Unasyn started on admission and he was deescalated to Augmentin per [name] -He was taken to the OR on 5/10 for extraction  of tooth #30 and incision and drainage of his right mandible -After patient returned from the OR he was discharged by [name]  #2.  Hypertension.  Fair control.  Home medications include amlodipine, atenolol, clonidine Resume home meds  #3.  Diabetes.  Hemoglobin A1c in April was 5.4.  Home medications include Metformin. Resume home meds  #4.  Major depressive disorder/bipolar 1 disorder.  Stable at baseline. -Continue home meds  #5. Opiod Dependence.  Home medications include Xtampza ER and percocet. -continue home meds -Encourage ibuprofen in between doses of meds at home Patient has been using oxycodone 4 times a day here, patient states he only takes it 3 times a day at home so will provide short-term prescription  #6.  GERD.  At baseline  #7.  Seizure-like activity.  Patient reports no seizure-like activity for almost a year. -Home meds  #8. Pneumonia. Per CT. No fever hemodynamically stable nontoxic-appearing.  -On antibiotics  #9. Tobacco use. Cessation counseling offered    Medical Consultants:   Oral surgery   Discharge Exam:   Vitals:   01/29/20 1655 01/29/20 1705  BP: (!) 111/59   Pulse: 79 83  Resp: 17 16  Temp:  (!) 97.2 F (36.2 C)  SpO2: 95% 93%   Vitals:   01/29/20 1152 01/29/20 1640 01/29/20 1655 01/29/20 1705  BP: 111/77 102/61 (!) 111/59   Pulse: 74 87 79 83  Resp: 18 13 17 16   Temp:  (!) 97.1 F (36.2 C)  (!) 97.2 F (36.2 C)  TempSrc:      SpO2: 91% 98% 95% 93%  Weight:  Height:         The results of significant diagnostics from this hospitalization (including imaging, microbiology, ancillary and laboratory) are listed below for reference.     Procedures and Diagnostic Studies:   CT Soft Tissue Neck W Contrast  Result Date: 01/28/2020 CLINICAL DATA:  Facial abscess, suspected odontogenic origin; neck abscess, deep tissue. Additional history provided: Patient has been on amoxicillin for the last 3 weeks since tooth  broke, now with swelling to right side of face. EXAM: CT NECK WITH CONTRAST TECHNIQUE: Multidetector CT imaging of the neck was performed using the standard protocol following the bolus administration of intravenous contrast. CONTRAST:  88mL OMNIPAQUE IOHEXOL 300 MG/ML  SOLN COMPARISON:  Neck CT 10/22/2005. FINDINGS: Pharynx and larynx: Poor dentition with multiple absent, carious and fractured teeth. There are also multifocal periapical lucencies. Most notably, there is prominent periapical lucency surrounding the right mandibular first molar with associated inferomedial cortical breakthrough (series 3, image 51) (series 5, image 38). Immediately adjacent, there is a hypodense peripherally enhancing collection along the superficial aspect of the right mandible measuring 1.3 x 1.2 cm consistent with abscess (series 2, image 49) (series 4, image 31). There is extensive surrounding right perimandibular phlegmonous change. There is also prominent right perimandibular inflammatory stranding and edema which extends inferiorly within the neck (greater on the right). Salivary glands: The submandibular glands are edematous with surrounding inflammatory stranding (greater on the right). The parotid glands are unremarkable. Thyroid: Unremarkable. Lymph nodes: Right upper cervical lymphadenopathy, likely reactive. Vascular: The major vascular structures of the neck are patent. Limited intracranial: No acute abnormality identified. Visualized orbits: Visualized orbits show no acute finding. Mastoids and visualized paranasal sinuses: Mild ethmoid and maxillary sinus mucosal thickening. No significant mastoid effusion. Skeleton: No acute bony abnormality or aggressive osseous lesion. Upper chest: Patchy airspace disease within the imaged right lung likely reflecting multifocal pneumonia. IMPRESSION: Prominent periapical lucency surrounding the right mandibular first molar with associated inferomedial cortical breakthrough.  Immediately adjacent, there is a 1.3 cm abscess along the superficial aspect of the right mandible. There is extensive surrounding right perimandibular phlegmon. Associated prominent right perimandibular cellulitis changes which extend inferiorly within the neck. Patchy airspace disease within the imaged right lung likely reflecting pneumonia. Radiographic follow-up to resolution recommended. Mild ethmoid and maxillary sinus mucosal thickening. Electronically Signed   By: Kellie Simmering DO   On: 01/28/2020 13:58     Labs:   Basic Metabolic Panel: Recent Labs  Lab 01/28/20 1239 01/29/20 0725  NA 135 139  K 3.7 3.8  CL 101 108  CO2 24 25  GLUCOSE 87 94  BUN 9 5*  CREATININE 0.95 0.99  CALCIUM 8.7* 8.0*   GFR Estimated Creatinine Clearance: 135.5 mL/min (by C-G formula based on SCr of 0.99 mg/dL). Liver Function Tests: Recent Labs  Lab 01/28/20 1239  AST 22  ALT 19  ALKPHOS 74  BILITOT 0.5  PROT 6.9  ALBUMIN 3.9   No results for input(s): LIPASE, AMYLASE in the last 168 hours. No results for input(s): AMMONIA in the last 168 hours. Coagulation profile No results for input(s): INR, PROTIME in the last 168 hours.  CBC: Recent Labs  Lab 01/28/20 1239 01/29/20 0725  WBC 11.5* 8.2  NEUTROABS 8.1*  --   HGB 12.2* 11.5*  HCT 36.6* 34.0*  MCV 99.5 98.8  PLT 197 197   Cardiac Enzymes: No results for input(s): CKTOTAL, CKMB, CKMBINDEX, TROPONINI in the last 168 hours. BNP: Invalid input(s): POCBNP CBG: Recent  Labs  Lab 01/28/20 2133 01/29/20 0625 01/29/20 1142 01/29/20 1416 01/29/20 1641  GLUCAP 121* 92 92 72 88   D-Dimer No results for input(s): DDIMER in the last 72 hours. Hgb A1c No results for input(s): HGBA1C in the last 72 hours. Lipid Profile No results for input(s): CHOL, HDL, LDLCALC, TRIG, CHOLHDL, LDLDIRECT in the last 72 hours. Thyroid function studies No results for input(s): TSH, T4TOTAL, T3FREE, THYROIDAB in the last 72 hours.  Invalid  input(s): FREET3 Anemia work up No results for input(s): VITAMINB12, FOLATE, FERRITIN, TIBC, IRON, RETICCTPCT in the last 72 hours. Microbiology Recent Results (from the past 240 hour(s))  Respiratory Panel by RT PCR (Flu A&B, Covid) - Nasopharyngeal Swab     Status: None   Collection Time: 01/28/20  5:25 PM   Specimen: Nasopharyngeal Swab  Result Value Ref Range Status   SARS Coronavirus 2 by RT PCR NEGATIVE NEGATIVE Final    Comment: (NOTE) SARS-CoV-2 target nucleic acids are NOT DETECTED. The SARS-CoV-2 RNA is generally detectable in upper respiratoy specimens during the acute phase of infection. The lowest concentration of SARS-CoV-2 viral copies this assay can detect is 131 copies/mL. A negative result does not preclude SARS-Cov-2 infection and should not be used as the sole basis for treatment or other patient management decisions. A negative result may occur with  improper specimen collection/handling, submission of specimen other than nasopharyngeal swab, presence of viral mutation(s) within the areas targeted by this assay, and inadequate number of viral copies (<131 copies/mL). A negative result must be combined with clinical observations, patient history, and epidemiological information. The expected result is Negative. Fact Sheet for Patients:  PinkCheek.be Fact Sheet for Healthcare Providers:  GravelBags.it This test is not yet ap proved or cleared by the [place] FDA and  has been authorized for detection and/or diagnosis of SARS-CoV-2 by FDA under an Emergency Use Authorization (EUA). This EUA will remain  in effect (meaning this test can be used) for the duration of the COVID-19 declaration under Section 564(b)(1) of the Act, 21 U.S.C. section 360bbb-3(b)(1), unless the authorization is terminated or revoked sooner.    Influenza A by PCR NEGATIVE NEGATIVE Final   Influenza B by PCR NEGATIVE NEGATIVE  Final    Comment: (NOTE) The Xpert Xpress SARS-CoV-2/FLU/RSV assay is intended as an aid in  the diagnosis of influenza from Nasopharyngeal swab specimens and  should not be used as a sole basis for treatment. Nasal washings and  aspirates are unacceptable for Xpert Xpress SARS-CoV-2/FLU/RSV  testing. Fact Sheet for Patients: PinkCheek.be Fact Sheet for Healthcare Providers: GravelBags.it This test is not yet approved or cleared by the [place] FDA and  has been authorized for detection and/or diagnosis of SARS-CoV-2 by  FDA under an Emergency Use Authorization (EUA). This EUA will remain  in effect (meaning this test can be used) for the duration of the  Covid-19 declaration under Section 564(b)(1) of the Act, 21  U.S.C. section 360bbb-3(b)(1), unless the authorization is  terminated or revoked. Performed at [facility], [address], [place], [place] [address]      Discharge Instructions:   Discharge Instructions    Call MD for:  difficulty breathing, headache or visual disturbances   Complete by: As directed    Call MD for:  persistant nausea and vomiting   Complete by: As directed    Call MD for:  severe uncontrolled pain   Complete by: As directed    Call MD for:  temperature >100.4  Complete by: As directed    Diet - low sodium heart healthy   Complete by: As directed    Soft Diet. Advance as tolerated.   Discharge instructions   Complete by: As directed    Ice to affected area for 2-3 days. Soft diet, advance as tolerated. Warm salt water mouth rinses 4-5 times per day starting the day after surgery. No smoking for 2 weeks. Keep neck bandage dry. Call [phone] for follow up appointment for drain removal on Thurday   Increase activity slowly   Complete by: As directed      Allergies as of 01/29/2020      Reactions   Hydrocodone Itching      Medication List    TAKE these medications    amLODipine 5 MG tablet Commonly known as: NORVASC Take 1 tablet (5 mg total) by mouth daily.   amoxicillin-clavulanate 875-125 MG tablet Commonly known as: Augmentin Take 1 tablet by mouth 2 (two) times daily for 10 days.   atenolol 50 MG tablet Commonly known as: TENORMIN Take 50 mg by mouth daily.   bacitracin 500 UNIT/GM ointment Apply 1 application topically 2 (two) times daily.   benztropine 1 MG tablet Commonly known as: COGENTIN Take 1 mg by mouth at bedtime.   busPIRone 7.5 MG tablet Commonly known as: BUSPAR Take 7.5 mg by mouth 3 (three) times daily.   clindamycin 300 MG capsule Commonly known as: Cleocin Take 1 capsule (300 mg total) by mouth 3 (three) times daily.   cloNIDine 0.1 MG tablet Commonly known as: CATAPRES Take 1 tablet (0.1 mg total) by mouth 3 (three) times daily.   clotrimazole-betamethasone cream Commonly known as: Lotrisone Apply 1 application topically 2 (two) times daily.   cyclobenzaprine 10 MG tablet Commonly known as: FLEXERIL Take 10 mg by mouth every 8 (eight) hours.   desvenlafaxine 50 MG 24 hr tablet Commonly known as: PRISTIQ Take 50 mg by mouth daily.   doxycycline 100 MG capsule Commonly known as: VIBRAMYCIN Take 1 capsule (100 mg total) by mouth 2 (two) times daily.   fenofibrate 145 MG tablet Commonly known as: TRICOR Take 145 mg by mouth daily.   gabapentin 600 MG tablet Commonly known as: NEURONTIN Take 600 mg by mouth 3 (three) times daily.   hydrOXYzine 25 MG capsule Commonly known as: VISTARIL Take 25-50 mg by mouth 3 (three) times daily.   ibuprofen 800 MG tablet Commonly known as: ADVIL TAKE (1) TABLET BY MOUTH EVERY (8) HOURS AS NEEDED FOR PAIN. What changed: See the new instructions.   lamoTRIgine 200 MG tablet Commonly known as: LAMICTAL Take 200 mg by mouth daily.   levothyroxine 50 MCG tablet Commonly known as: SYNTHROID Take 50 mcg by mouth daily before breakfast.   lidocaine 5 % Commonly  known as: Lidoderm Place 1 patch onto the skin daily. Remove & Discard patch within 12 hours or as directed by MD   linaclotide 290 MCG Caps capsule Commonly known as: LINZESS Take 1 capsule (290 mcg total) by mouth daily. For constipation What changed:   when to take this  reasons to take this   lithium carbonate 300 MG capsule Take 1 tablet (300 mg) by mouth in the morning and 2 tablets (600 mg) at bedtime: mood stabilization   meloxicam 15 MG tablet Commonly known as: MOBIC DISSOLVE 1 TABLET ON THE TONGUE DAILY What changed: See the new instructions.   metFORMIN 500 MG tablet Commonly known as: GLUCOPHAGE Take 500 mg by mouth  daily.   omeprazole 20 MG capsule Commonly known as: PRILOSEC Take 1 capsule (20 mg total) by mouth daily. For acid reflux   ondansetron 8 MG disintegrating tablet Commonly known as: ZOFRAN-ODT Take 8 mg by mouth every 8 (eight) hours as needed.   OXcarbazepine 150 MG tablet Commonly known as: Trileptal Take 1 tablet (150 mg total) by mouth 2 (two) times daily.   oxyCODONE-acetaminophen 10-325 MG tablet Commonly known as: PERCOCET Take 1 tablet by mouth every 4 (four) hours as needed for pain. What changed: when to take this   promethazine 12.5 MG tablet Commonly known as: PHENERGAN Take 1 tablet (12.5 mg total) by mouth every 6 (six) hours as needed for nausea or vomiting.   QUEtiapine 400 MG tablet Commonly known as: SEROQUEL Take 1 tablet (400 mg total) by mouth at bedtime.   Rexulti 1 MG Tabs tablet Generic drug: brexpiprazole Take 1 mg by mouth at bedtime.   simvastatin 20 MG tablet Commonly known as: ZOCOR Take 20 mg by mouth daily.   tadalafil 20 MG tablet Commonly known as: CIALIS Take 1 tablet (20 mg total) by mouth daily as needed for erectile dysfunction.   venlafaxine XR 75 MG 24 hr capsule Commonly known as: EFFEXOR-XR Take 1 capsule (75 mg total) by mouth daily with breakfast.   Xtampza ER 13.5 MG C12a Generic  drug: oxyCODONE ER Take 13.5 mg by mouth in the morning and at bedtime.         Time coordinating discharge: 35 min  Signed:  Geradine Girt DO  [facility] 01/29/2020, 5:28 PM
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
## 2020-01-29 NOTE — Progress Notes (Signed)
Progress Note    MAICO WECKER  [id number] DOB: 01-21-1981  DOA: 01/28/2020 PCP: [name], MD    Brief Narrative:    Medical records reviewed and are as summarized below:  Jermaine [name] is an 39 y.o. male with a past medical history that includes diabetes, hypertension, opioid dependence, major depressive disorder, bipolar 1 disorder, seizure-like activity admitted May 9 with mandibular abscess.  Had a toothache for several weeks saw a dentist was given oral antibiotics but tooth continued to hurt developed swelling.  Assessment/Plan:   Principal Problem:   Mandibular abscess Active Problems:   Hypertension   Pneumonia   Opioid dependence ([place])   Tobacco abuse   Diabetes mellitus without complication (HCC)   Seizure-like activity (HCC)   GERD (gastroesophageal reflux disease)   Major depressive disorder, recurrent episode ([place])   Bipolar 1 disorder ([place])   #1.  Mandibular abscess likely related to tooth decay.  CT reveals prominent periapical lucency surrounding the right mandibular first molar with associated inferomedial cortical breakthrough.  Immediately adjacent there is a 1.3 cm abscess.  Extensive surrounding right perimandibular phlegmon as well as prominent right perimandibular cellulitis.  He was given oral antibiotics in the outpatient setting.  Unasyn started on admission.  He is afebrile hemodynamically stable and nontoxic-appearing -Continue Unasyn -Keep n.p.o  Of note I spoke with Dr Stefanie Libel this morning and plan is to take to OR this afternoon.  -Supportive therapy  #2.  Hypertension.  Fair control.  Home medications include amlodipine, atenolol, clonidine -Continue home meds -Monitor  #3.  Diabetes.  Hemoglobin A1c in April was 5.4.  Home medications include Metformin. -Continue to hold oral agents for now -Sliding scale for optimal control  #4.  Major depressive disorder/bipolar 1 disorder.  Stable at baseline. -Continue home meds  #5. Opiod  Dependence.  Home medications include Xtampza ER and percocet. -continue home meds  #6.  GERD.  At baseline  #7.  Seizure-like activity.  Patient reports no seizure-like activity for almost a year. -Home meds  #8. Pneumonia. Per CT. No fever hemodynamically stable nontoxic-appearing.  May have had some aspiration. O2 sat 91% on room air.  Unasyn  -continue unasyn  #9. Tobacco use. Cessation counseling offered    Family Communication/Anticipated D/C date and plan/Code Status   DVT prophylaxis: scd ordered. Code Status: Full Code.  Family Communication: Patient at bedside Disposition Plan: Status is: Inpatient  Remains inpatient appropriate because:IV treatments appropriate due to intensity of illness or inability to take PO   Dispo: The patient is from: Home              Anticipated d/c is to: Home              Anticipated d/c date is: 1 day              Patient currently is not medically stable to d/c.          Medical Consultants:    Hoyt Koch oral surgery   Anti-Infectives:    None  Subjective:   Complains of continued pain in that right jaw.  Reports minimal improvement in swelling but is able to open his mouth better this morning.  No nausea vomiting  Objective:    Vitals:   01/28/20 1443 01/28/20 1836 01/28/20 1956 01/29/20 0626  BP: 127/87 (!) 144/87 (!) 143/83 129/80  Pulse: 82 78 82 79  Resp: 17 16 16 16   Temp: 98.6 F (37 C) 98.1 F (  36.7 C) 97.8 F (36.6 C) 97.7 F (36.5 C)  TempSrc: Oral Oral Oral Oral  SpO2: 97% 98% 100% 91%  Weight:      Height:        Intake/Output Summary (Last 24 hours) at 01/29/2020 1008 Last data filed at 01/29/2020 0300 Gross per 24 hour  Intake 1201.61 ml  Output 750 ml  Net 451.61 ml   Filed Weights   01/28/20 1154  Weight: 113.4 kg    Exam: General: Ambulating in hall with steady gait no acute distress CV: Regular rate and rhythm no murmur gallop or rub no lower extremity edema Respiratory: No  increased work of breathing breath sounds clear bilaterally hear no wheeze no rhonchi Abdomen: Obese soft positive bowel sounds throughout no guarding or rebounding Musculoskeletal joints without swelling/erythema Neuro: Awake alert oriented x3 speech clear facial symmetry cooperative calm HEENT: Swelling on the right side of his face from cheekbone down to underneath his right jaw extending over to lower lip.  Decreased ability to open mount swelling at the cheek area is soft and not as tender however right jaw near that molar tissue is much harder and area is quite tender to palpation.  Data Reviewed:   I have personally reviewed following labs and imaging studies:  Labs: Labs show the following:   Basic Metabolic Panel: Recent Labs  Lab 01/28/20 1239 01/29/20 0725  NA 135 139  K 3.7 3.8  CL 101 108  CO2 24 25  GLUCOSE 87 94  BUN 9 5*  CREATININE 0.95 0.99  CALCIUM 8.7* 8.0*   GFR Estimated Creatinine Clearance: 135.5 mL/min (by C-G formula based on SCr of 0.99 mg/dL). Liver Function Tests: Recent Labs  Lab 01/28/20 1239  AST 22  ALT 19  ALKPHOS 74  BILITOT 0.5  PROT 6.9  ALBUMIN 3.9   No results for input(s): LIPASE, AMYLASE in the last 168 hours. No results for input(s): AMMONIA in the last 168 hours. Coagulation profile No results for input(s): INR, PROTIME in the last 168 hours.  CBC: Recent Labs  Lab 01/28/20 1239 01/29/20 0725  WBC 11.5* 8.2  NEUTROABS 8.1*  --   HGB 12.2* 11.5*  HCT 36.6* 34.0*  MCV 99.5 98.8  PLT 197 197   Cardiac Enzymes: No results for input(s): CKTOTAL, CKMB, CKMBINDEX, TROPONINI in the last 168 hours. BNP (last 3 results) No results for input(s): PROBNP in the last 8760 hours. CBG: Recent Labs  Lab 01/28/20 1849 01/28/20 2133 01/29/20 0625  GLUCAP 104* 121* 92   D-Dimer: No results for input(s): DDIMER in the last 72 hours. Hgb A1c: No results for input(s): HGBA1C in the last 72 hours. Lipid Profile: No results  for input(s): CHOL, HDL, LDLCALC, TRIG, CHOLHDL, LDLDIRECT in the last 72 hours. Thyroid function studies: No results for input(s): TSH, T4TOTAL, T3FREE, THYROIDAB in the last 72 hours.  Invalid input(s): FREET3 Anemia work up: No results for input(s): VITAMINB12, FOLATE, FERRITIN, TIBC, IRON, RETICCTPCT in the last 72 hours. Sepsis Labs: Recent Labs  Lab 01/28/20 1239 01/29/20 0725  WBC 11.5* 8.2    Microbiology Recent Results (from the past 240 hour(s))  Respiratory Panel by RT PCR (Flu A&B, Covid) - Nasopharyngeal Swab     Status: None   Collection Time: 01/28/20  5:25 PM   Specimen: Nasopharyngeal Swab  Result Value Ref Range Status   SARS Coronavirus 2 by RT PCR NEGATIVE NEGATIVE Final    Comment: (NOTE) SARS-CoV-2 target nucleic acids are NOT DETECTED. The  SARS-CoV-2 RNA is generally detectable in upper respiratoy specimens during the acute phase of infection. The lowest concentration of SARS-CoV-2 viral copies this assay can detect is 131 copies/mL. A negative result does not preclude SARS-Cov-2 infection and should not be used as the sole basis for treatment or other patient management decisions. A negative result may occur with  improper specimen collection/handling, submission of specimen other than nasopharyngeal swab, presence of viral mutation(s) within the areas targeted by this assay, and inadequate number of viral copies (<131 copies/mL). A negative result must be combined with clinical observations, patient history, and epidemiological information. The expected result is Negative. Fact Sheet for Patients:  PinkCheek.be Fact Sheet for Healthcare Providers:  GravelBags.it This test is not yet ap proved or cleared by the [place] FDA and  has been authorized for detection and/or diagnosis of SARS-CoV-2 by FDA under an Emergency Use Authorization (EUA). This EUA will remain  in effect (meaning this  test can be used) for the duration of the COVID-19 declaration under Section 564(b)(1) of the Act, 21 U.S.C. section 360bbb-3(b)(1), unless the authorization is terminated or revoked sooner.    Influenza A by PCR NEGATIVE NEGATIVE Final   Influenza B by PCR NEGATIVE NEGATIVE Final    Comment: (NOTE) The Xpert Xpress SARS-CoV-2/FLU/RSV assay is intended as an aid in  the diagnosis of influenza from Nasopharyngeal swab specimens and  should not be used as a sole basis for treatment. Nasal washings and  aspirates are unacceptable for Xpert Xpress SARS-CoV-2/FLU/RSV  testing. Fact Sheet for Patients: PinkCheek.be Fact Sheet for Healthcare Providers: GravelBags.it This test is not yet approved or cleared by the [place] FDA and  has been authorized for detection and/or diagnosis of SARS-CoV-2 by  FDA under an Emergency Use Authorization (EUA). This EUA will remain  in effect (meaning this test can be used) for the duration of the  Covid-19 declaration under Section 564(b)(1) of the Act, 21  U.S.C. section 360bbb-3(b)(1), unless the authorization is  terminated or revoked. Performed at [facility], [address], [place], [place] [address]     Procedures and diagnostic studies:  CT Soft Tissue Neck W Contrast  Result Date: 01/28/2020 CLINICAL DATA:  Facial abscess, suspected odontogenic origin; neck abscess, deep tissue. Additional history provided: Patient has been on amoxicillin for the last 3 weeks since tooth broke, now with swelling to right side of face. EXAM: CT NECK WITH CONTRAST TECHNIQUE: Multidetector CT imaging of the neck was performed using the standard protocol following the bolus administration of intravenous contrast. CONTRAST:  51mL OMNIPAQUE IOHEXOL 300 MG/ML  SOLN COMPARISON:  Neck CT 10/22/2005. FINDINGS: Pharynx and larynx: Poor dentition with multiple absent, carious and fractured teeth. There are also  multifocal periapical lucencies. Most notably, there is prominent periapical lucency surrounding the right mandibular first molar with associated inferomedial cortical breakthrough (series 3, image 51) (series 5, image 38). Immediately adjacent, there is a hypodense peripherally enhancing collection along the superficial aspect of the right mandible measuring 1.3 x 1.2 cm consistent with abscess (series 2, image 49) (series 4, image 31). There is extensive surrounding right perimandibular phlegmonous change. There is also prominent right perimandibular inflammatory stranding and edema which extends inferiorly within the neck (greater on the right). Salivary glands: The submandibular glands are edematous with surrounding inflammatory stranding (greater on the right). The parotid glands are unremarkable. Thyroid: Unremarkable. Lymph nodes: Right upper cervical lymphadenopathy, likely reactive. Vascular: The major vascular structures of the neck are patent. Limited intracranial:  No acute abnormality identified. Visualized orbits: Visualized orbits show no acute finding. Mastoids and visualized paranasal sinuses: Mild ethmoid and maxillary sinus mucosal thickening. No significant mastoid effusion. Skeleton: No acute bony abnormality or aggressive osseous lesion. Upper chest: Patchy airspace disease within the imaged right lung likely reflecting multifocal pneumonia. IMPRESSION: Prominent periapical lucency surrounding the right mandibular first molar with associated inferomedial cortical breakthrough. Immediately adjacent, there is a 1.3 cm abscess along the superficial aspect of the right mandible. There is extensive surrounding right perimandibular phlegmon. Associated prominent right perimandibular cellulitis changes which extend inferiorly within the neck. Patchy airspace disease within the imaged right lung likely reflecting pneumonia. Radiographic follow-up to resolution recommended. Mild ethmoid and maxillary  sinus mucosal thickening. Electronically Signed   By: Kellie Simmering DO   On: 01/28/2020 13:58    Medications:   . amLODipine  5 mg Oral Daily  . atenolol  50 mg Oral Daily  . benztropine  1 mg Oral QHS  . brexpiprazole  1 mg Oral QHS  . busPIRone  7.5 mg Oral TID  . cloNIDine  0.1 mg Oral TID  . cyclobenzaprine  10 mg Oral Q8H  . fenofibrate  160 mg Oral Daily  . gabapentin  600 mg Oral TID  . hydrOXYzine  25-50 mg Oral TID  . insulin aspart  0-15 Units Subcutaneous TID WC  . insulin aspart  0-5 Units Subcutaneous QHS  . lamoTRIgine  200 mg Oral Daily  . levothyroxine  50 mcg Oral Q0600  . lithium carbonate  300 mg Oral Daily   And  . lithium carbonate  600 mg Oral QHS  . meloxicam  15 mg Oral Q breakfast  . nicotine  21 mg Transdermal Daily  . OXcarbazepine  150 mg Oral BID  . oxyCODONE  15 mg Oral BID  . pantoprazole (PROTONIX) IV  40 mg Intravenous Q12H  . simvastatin  20 mg Oral q1800  . venlafaxine XR  75 mg Oral Q breakfast   Continuous Infusions: . sodium chloride 125 mL/hr at 01/29/20 0419  . ampicillin-sulbactam (UNASYN) IV 3 g (01/29/20 0457)     LOS: 1 day   Radene Gunning NP  [facility]   How to contact the [facility] Attending or Consulting provider [place] or covering provider during after hours [place], for this patient?  1. Check the care team in [facility] and look for a) attending/consulting [facility] provider listed and b) the [facility] team listed 2. Log into [email or web address] and use [place] universal password to access. If you do not have the password, please contact the hospital operator. 3. Locate the [facility] provider you are looking for under [facility] and page to a number that you can be directly reached. 4. If you still have difficulty reaching the provider, please page the [facility] (Director on Call) for the Hospitalists listed on amion for assistance.  01/29/2020, 10:08 AM

## 2020-01-29 NOTE — Op Note (Signed)
NAMEDELEON, SEMRAD MEDICAL RECORD [id number] ACCOUNT [id number] DATE OF BIRTH:Aug 23, 1981 FACILITY: [facility] LOCATION: [facility] PHYSICIAN:Nakenya Theall M. Nanda Bittick, DDS  OPERATIVE REPORT  DATE OF PROCEDURE:  01/29/2020  PREOPERATIVE DIAGNOSES:   1.  Right mandibular abscessed tooth #30. 2.  Right perimandibular abscess.  POSTOPERATIVE DIAGNOSES: 1.  Right mandibular abscessed tooth #30. 2.  Right perimandibular abscess.  PROCEDURE:   Extraction of tooth #30 with incision and drainage, right mandible via intraoral and extraoral approaches.  SURGEON:  Diona Browner, DDS  ANESTHESIA:  General, nasal intubation, Dr. Lanetta Inch attending.  DESCRIPTION OF PROCEDURE:  The patient was taken to the operating room and placed on the table in supine position.  General anesthesia was administered intravenously and a nasal endotracheal tube was placed and secured.  The eyes were protected and the  patient was prepped and draped for surgery.  A timeout was performed.  The mandible was palpated and there was found to be an area of induration below the inferior border of the mandible in the submandibular area.  Local anesthesia was administered, 1%  lidocaine 1:100,000 epinephrine in the area of approximately 1 cm below the inferior border of the mandible beneath the area of the most induration.  After the anesthesia was administered, a 15 blade was used to make a 1 cm incision through skin and  subcutaneous tissue.  The Cryer elevator was then used to bluntly dissect up into the inferior border of the mandible and the lingual and buccal aspects were entered.  No purulent material was obtained at that time.  Then, the oral cavity was examined  and a throat pack was placed.  Lidocaine 2% with 1:100,000 epinephrine was infiltrated in an inferior alveolar block and buccally around tooth #30.  A 15 blade was used to make an incision in the buccal mucosa in the vestibule and purulent material was  obtained, approximately 3  mL.  Aerobic and anaerobic cultures were taken.  Then, the 15 blade was used to make an incision around the tooth.  The periosteum was reflected with the periosteal elevator.  The tooth was then sectioned and the roots were  removed individually with a 301 elevator.  The socket was curetted.  Then, a Penrose drain was placed in the right neck incision and sutured to the skin with 4-0 silk and another drain was placed intraorally in the buccal vestibule incision of the right  mandible and sutured to the mucosa with a 3-0 silk.  Then, the oral cavity was irrigated, suctioned and the throat pack was removed.  The patient was left in care of anesthesia for extubation with plans for transport to recovery room and discharged home  if stable.  ESTIMATED BLOOD LOSS:  Minimal.  COMPLICATIONS:  None.  SPECIMENS:  None.  DRAINS:  Quarter-inch Penrose right neck and quarter-inch Penrose right buccal vestibule of the mandible.  VN/NUANCE  D:01/29/2020 T:01/29/2020 JOB:[id number]

## 2020-01-29 NOTE — Progress Notes (Signed)
RN gave pt discharge instructions and pt stated understanding, IV has been removed and prescriptions escribed to pt home pharmacy. Antibiotic escribed to [facility] and percocet escribed to pt home [facility] pharmacy. Pt stated that his percocet 10mg -325 is not effective and wants to know if he can get an increased dose, MD made aware and she stated that his Pian MD would have to increase it. Encouraged pt to reach out to his pain clinic doctor.

## 2020-01-29 NOTE — Anesthesia Preprocedure Evaluation (Addendum)
Anesthesia Evaluation  Patient identified by MRN, date of birth, ID band Patient awake    Reviewed: Allergy & Precautions, NPO status , Patient's Chart, lab work & pertinent test results, reviewed documented beta blocker date and time   Airway Mallampati: III  TM Distance: >3 FB Neck ROM: Full  Mouth opening: Limited Mouth Opening  Dental  (+) Dental Advisory Given, Poor Dentition   Pulmonary sleep apnea , Current Smoker and Patient abstained from smoking.,    Pulmonary exam normal breath sounds clear to auscultation       Cardiovascular hypertension, Pt. on medications and Pt. on home beta blockers + CAD  Normal cardiovascular exam Rhythm:Regular Rate:Normal     Neuro/Psych Seizures - (last one 3 months ago),  PSYCHIATRIC DISORDERS Anxiety Depression Bipolar Disorder    GI/Hepatic GERD  Medicated,(+)     substance abuse  ,   Endo/Other  diabetes, Oral Hypoglycemic AgentsHypothyroidism   Renal/GU negative Renal ROS  negative genitourinary   Musculoskeletal negative musculoskeletal ROS (+) narcotic dependent  Abdominal   Peds  Hematology negative hematology ROS (+)   Anesthesia Other Findings Right mandibular abscess  Reproductive/Obstetrics                           Anesthesia Physical Anesthesia Plan  ASA: III  Anesthesia Plan: General   Post-op Pain Management:    Induction: Intravenous  PONV Risk Score and Plan: 1 and Midazolam, Dexamethasone and Ondansetron  Airway Management Planned: Nasal ETT  Additional Equipment:   Intra-op Plan:   Post-operative Plan: Extubation in OR  Informed Consent: I have reviewed the patients History and Physical, chart, labs and discussed the procedure including the risks, benefits and alternatives for the proposed anesthesia with the patient or authorized representative who has indicated his/her understanding and acceptance.     Dental  advisory given  Plan Discussed with: CRNA  Anesthesia Plan Comments:         Anesthesia Quick Evaluation

## 2020-01-29 NOTE — Anesthesia Procedure Notes (Signed)
Procedure Name: Intubation Date/Time: 01/29/2020 3:58 PM Performed by: Clovis Cao, CRNA Pre-anesthesia Checklist: Patient identified, Emergency Drugs available, Suction available, Patient being monitored and Timeout performed Patient Re-evaluated:Patient Re-evaluated prior to induction Oxygen Delivery Method: Circle system utilized Preoxygenation: Pre-oxygenation with 100% oxygen Induction Type: IV induction, Rapid sequence and Cricoid Pressure applied Laryngoscope Size: Glidescope Grade View: Grade I Nasal Tubes: Right, Nasal Rae and Magill forceps- large, utilized Tube size: 7.5 mm Number of attempts: 1 (DL by House, MDA.) Airway Equipment and Method: Video-laryngoscopy Placement Confirmation: ETT inserted through vocal cords under direct vision,  positive ETCO2 and breath sounds checked- equal and bilateral Secured at: 29 cm Tube secured with: Tape Dental Injury: Teeth and Oropharynx as per pre-operative assessment

## 2020-01-29 NOTE — Brief Op Note (Signed)
01/29/2020  4:13 PM  PATIENT:  Jermaine [name]  39 y.o. male  PRE-OPERATIVE DIAGNOSIS:  Right mandible abcessed tooth #30. Right peri- mandibular abscess.  POST-OPERATIVE DIAGNOSIS:  SAME  PROCEDURE:  Procedure(s): TOOTH #30 EXTRACTION WITH INCISION AND DRAINAGE RIGHT MANDIBLE.  SURGEON:  Surgeon(s): Diona Browner, DDS  ANESTHESIA:   local and general  EBL:  minimal  DRAINS: 1/4" Penrose Right neck  1/4": Penrose  buccal vestibule right mandible  SPECIMEN:  No Specimen  COUNTS:  YES  PLAN OF CARE: Discharge to home after PACU  PATIENT DISPOSITION:  PACU - hemodynamically stable.   PROCEDURE DETAILS: Dictation # [id number]  [name], DMD 01/29/2020 4:13 PM

## 2020-01-30 ENCOUNTER — Other Ambulatory Visit: Payer: Self-pay

## 2020-01-30 ENCOUNTER — Emergency Department (HOSPITAL_COMMUNITY)
Admission: EM | Admit: 2020-01-30 | Discharge: 2020-01-30 | Disposition: A | Discharge status: Home / Self Care | Payer: Medicaid Other | Attending: Emergency Medicine | Admitting: Emergency Medicine

## 2020-01-30 ENCOUNTER — Encounter: Payer: Self-pay | Admitting: *Deleted

## 2020-01-30 DIAGNOSIS — Z5321 Procedure and treatment not carried out due to patient leaving prior to being seen by health care provider: Secondary | ICD-10-CM | POA: Insufficient documentation

## 2020-01-30 DIAGNOSIS — R221 Localized swelling, mass and lump, neck: Secondary | ICD-10-CM | POA: Insufficient documentation

## 2020-01-30 LAB — HIV ANTIBODY (ROUTINE TESTING W REFLEX): HIV Screen 4th Generation wRfx: NONREACTIVE

## 2020-01-30 NOTE — Anesthesia Postprocedure Evaluation (Signed)
Anesthesia Post Note  Patient: Carolin Sicks  Procedure(s) Performed: TOOTH EXTRACTION WITH IRRIGATION AND DEBRIDEMENT RIGHT MANDIBLE. (Right Mouth)     Patient location during evaluation: PACU Anesthesia Type: General Level of consciousness: awake and alert and oriented Pain management: pain level controlled Vital Signs Assessment: post-procedure vital signs reviewed and stable Respiratory status: spontaneous breathing, nonlabored ventilation and respiratory function stable Cardiovascular status: blood pressure returned to baseline Postop Assessment: no apparent nausea or vomiting Anesthetic complications: no    Last Vitals:  Vitals:   01/29/20 1655 01/29/20 1705  BP: (!) 111/59   Pulse: 79 83  Resp: 17 16  Temp:  (!) 36.2 C  SpO2: 95% 93%    Last Pain:  Vitals:   01/29/20 1705  TempSrc:   PainSc: 0-No pain                 Brennan Bailey

## 2020-01-30 NOTE — ED Notes (Signed)
Pt not in waiting room

## 2020-01-30 NOTE — ED Notes (Signed)
Not in WR when called 

## 2020-01-30 NOTE — ED Triage Notes (Signed)
Pt discharged from hospital yesterday for abscess on right side of mouth. Pt states he had a tooth removed and had drain placed. Pt states redness has gotten worse and spread down his neck since discharge yesterday. Pt denies fever.

## 2020-02-01 ENCOUNTER — Other Ambulatory Visit: Payer: Self-pay | Admitting: Orthopedic Surgery

## 2020-02-01 ENCOUNTER — Inpatient Hospital Stay (HOSPITAL_COMMUNITY)
Admission: AD | Admit: 2020-02-01 | Discharge: 2020-02-06 | DRG: 158 | Disposition: A | Discharge status: Home / Self Care | Payer: Medicaid Other | Source: Ambulatory Visit | Attending: Internal Medicine | Admitting: Internal Medicine

## 2020-02-01 ENCOUNTER — Inpatient Hospital Stay (HOSPITAL_COMMUNITY): Payer: Medicaid Other

## 2020-02-01 DIAGNOSIS — I10 Essential (primary) hypertension: Secondary | ICD-10-CM | POA: Diagnosis present

## 2020-02-01 DIAGNOSIS — Z79890 Hormone replacement therapy (postmenopausal): Secondary | ICD-10-CM | POA: Diagnosis not present

## 2020-02-01 DIAGNOSIS — E119 Type 2 diabetes mellitus without complications: Secondary | ICD-10-CM | POA: Diagnosis present

## 2020-02-01 DIAGNOSIS — F603 Borderline personality disorder: Secondary | ICD-10-CM | POA: Diagnosis present

## 2020-02-01 DIAGNOSIS — Z823 Family history of stroke: Secondary | ICD-10-CM

## 2020-02-01 DIAGNOSIS — E785 Hyperlipidemia, unspecified: Secondary | ICD-10-CM | POA: Diagnosis present

## 2020-02-01 DIAGNOSIS — Z800 Family history of malignant neoplasm of digestive organs: Secondary | ICD-10-CM

## 2020-02-01 DIAGNOSIS — K219 Gastro-esophageal reflux disease without esophagitis: Secondary | ICD-10-CM | POA: Diagnosis present

## 2020-02-01 DIAGNOSIS — Z888 Allergy status to other drugs, medicaments and biological substances status: Secondary | ICD-10-CM | POA: Diagnosis not present

## 2020-02-01 DIAGNOSIS — Z7984 Long term (current) use of oral hypoglycemic drugs: Secondary | ICD-10-CM | POA: Diagnosis not present

## 2020-02-01 DIAGNOSIS — Z720 Tobacco use: Secondary | ICD-10-CM | POA: Diagnosis not present

## 2020-02-01 DIAGNOSIS — Z885 Allergy status to narcotic agent status: Secondary | ICD-10-CM

## 2020-02-01 DIAGNOSIS — G40909 Epilepsy, unspecified, not intractable, without status epilepticus: Secondary | ICD-10-CM | POA: Diagnosis present

## 2020-02-01 DIAGNOSIS — M542 Cervicalgia: Secondary | ICD-10-CM | POA: Diagnosis not present

## 2020-02-01 DIAGNOSIS — Z8249 Family history of ischemic heart disease and other diseases of the circulatory system: Secondary | ICD-10-CM

## 2020-02-01 DIAGNOSIS — F1120 Opioid dependence, uncomplicated: Secondary | ICD-10-CM | POA: Diagnosis present

## 2020-02-01 DIAGNOSIS — Z791 Long term (current) use of non-steroidal anti-inflammatories (NSAID): Secondary | ICD-10-CM

## 2020-02-01 DIAGNOSIS — R569 Unspecified convulsions: Secondary | ICD-10-CM

## 2020-02-01 DIAGNOSIS — Z79899 Other long term (current) drug therapy: Secondary | ICD-10-CM

## 2020-02-01 DIAGNOSIS — Z20822 Contact with and (suspected) exposure to covid-19: Secondary | ICD-10-CM | POA: Diagnosis present

## 2020-02-01 DIAGNOSIS — L03211 Cellulitis of face: Secondary | ICD-10-CM | POA: Diagnosis present

## 2020-02-01 DIAGNOSIS — F17210 Nicotine dependence, cigarettes, uncomplicated: Secondary | ICD-10-CM | POA: Diagnosis present

## 2020-02-01 DIAGNOSIS — F3132 Bipolar disorder, current episode depressed, moderate: Secondary | ICD-10-CM | POA: Diagnosis not present

## 2020-02-01 DIAGNOSIS — I2510 Atherosclerotic heart disease of native coronary artery without angina pectoris: Secondary | ICD-10-CM | POA: Diagnosis present

## 2020-02-01 DIAGNOSIS — F3130 Bipolar disorder, current episode depressed, mild or moderate severity, unspecified: Secondary | ICD-10-CM | POA: Diagnosis present

## 2020-02-01 DIAGNOSIS — F1129 Opioid dependence with unspecified opioid-induced disorder: Secondary | ICD-10-CM | POA: Diagnosis not present

## 2020-02-01 DIAGNOSIS — F419 Anxiety disorder, unspecified: Secondary | ICD-10-CM | POA: Diagnosis present

## 2020-02-01 DIAGNOSIS — M272 Inflammatory conditions of jaws: Secondary | ICD-10-CM | POA: Diagnosis present

## 2020-02-01 DIAGNOSIS — Z833 Family history of diabetes mellitus: Secondary | ICD-10-CM

## 2020-02-01 LAB — CBC WITH DIFFERENTIAL/PLATELET
Abs Immature Granulocytes: 0.06 10*3/uL (ref 0.00–0.07)
Basophils Absolute: 0 10*3/uL (ref 0.0–0.1)
Basophils Relative: 0 %
Eosinophils Absolute: 0.3 10*3/uL (ref 0.0–0.5)
Eosinophils Relative: 4 %
HCT: 38 % — ABNORMAL LOW (ref 39.0–52.0)
Hemoglobin: 13 g/dL (ref 13.0–17.0)
Immature Granulocytes: 1 %
Lymphocytes Relative: 27 %
Lymphs Abs: 2.6 10*3/uL (ref 0.7–4.0)
MCH: 32.8 pg (ref 26.0–34.0)
MCHC: 34.2 g/dL (ref 30.0–36.0)
MCV: 96 fL (ref 80.0–100.0)
Monocytes Absolute: 0.6 10*3/uL (ref 0.1–1.0)
Monocytes Relative: 7 %
Neutro Abs: 5.8 10*3/uL (ref 1.7–7.7)
Neutrophils Relative %: 61 %
Platelets: 317 10*3/uL (ref 150–400)
RBC: 3.96 MIL/uL — ABNORMAL LOW (ref 4.22–5.81)
RDW: 12.5 % (ref 11.5–15.5)
WBC: 9.4 10*3/uL (ref 4.0–10.5)
nRBC: 0 % (ref 0.0–0.2)

## 2020-02-01 LAB — COMPREHENSIVE METABOLIC PANEL
ALT: 17 U/L (ref 0–44)
AST: 19 U/L (ref 15–41)
Albumin: 3.4 g/dL — ABNORMAL LOW (ref 3.5–5.0)
Alkaline Phosphatase: 66 U/L (ref 38–126)
Anion gap: 9 (ref 5–15)
BUN: 6 mg/dL (ref 6–20)
CO2: 24 mmol/L (ref 22–32)
Calcium: 9.2 mg/dL (ref 8.9–10.3)
Chloride: 106 mmol/L (ref 98–111)
Creatinine, Ser: 0.98 mg/dL (ref 0.61–1.24)
GFR calc Af Amer: >60 mL/min (ref >60)
GFR calc non Af Amer: >60 mL/min (ref >60)
Glucose, Bld: 106 mg/dL — ABNORMAL HIGH (ref 70–99)
Potassium: 3.9 mmol/L (ref 3.5–5.1)
Sodium: 139 mmol/L (ref 135–145)
Total Bilirubin: 0.4 mg/dL (ref 0.3–1.2)
Total Protein: 7.1 g/dL (ref 6.5–8.1)

## 2020-02-01 LAB — HEMOGLOBIN A1C
Hgb A1c MFr Bld: 5.1 % (ref 4.8–5.6)
Mean Plasma Glucose: 99.67 mg/dL

## 2020-02-01 LAB — LITHIUM LEVEL: Lithium Lvl: 0.09 mmol/L — ABNORMAL LOW (ref 0.60–1.20)

## 2020-02-01 LAB — AEROBIC/ANAEROBIC CULTURE W GRAM STAIN (SURGICAL/DEEP WOUND)

## 2020-02-01 LAB — GLUCOSE, CAPILLARY: Glucose-Capillary: 97 mg/dL (ref 70–99)

## 2020-02-01 LAB — C-REACTIVE PROTEIN: CRP: 4.8 mg/dL — ABNORMAL HIGH (ref <1.0)

## 2020-02-01 LAB — SEDIMENTATION RATE: Sed Rate: 68 mm/hr — ABNORMAL HIGH (ref 0–16)

## 2020-02-01 MED ORDER — OXYCODONE HCL 5 MG PO TABS
5.0000 mg | ORAL_TABLET | ORAL | Status: DC | PRN
  Administered 2020-02-01 – 2020-02-06 (×19): 5 mg via ORAL
  Filled 2020-02-01 (×19): qty 1

## 2020-02-01 MED ORDER — OXYCODONE HCL ER 10 MG PO T12A
10.0000 mg | EXTENDED_RELEASE_TABLET | Freq: Two times a day (BID) | ORAL | Status: DC
  Administered 2020-02-01 – 2020-02-06 (×10): 10 mg via ORAL
  Filled 2020-02-01 (×10): qty 1

## 2020-02-01 MED ORDER — LITHIUM CARBONATE 300 MG PO CAPS
600.0000 mg | ORAL_CAPSULE | Freq: Every day | ORAL | Status: DC
  Administered 2020-02-02 – 2020-02-05 (×5): 600 mg via ORAL
  Filled 2020-02-01 (×6): qty 2

## 2020-02-01 MED ORDER — GABAPENTIN 600 MG PO TABS
600.0000 mg | ORAL_TABLET | Freq: Three times a day (TID) | ORAL | Status: DC
  Administered 2020-02-02 – 2020-02-06 (×14): 600 mg via ORAL
  Filled 2020-02-01 (×14): qty 1

## 2020-02-01 MED ORDER — LAMOTRIGINE 100 MG PO TABS
200.0000 mg | ORAL_TABLET | Freq: Every day | ORAL | Status: DC
  Administered 2020-02-02 – 2020-02-06 (×5): 200 mg via ORAL
  Filled 2020-02-01 (×5): qty 2

## 2020-02-01 MED ORDER — SODIUM CHLORIDE 0.9% FLUSH
3.0000 mL | Freq: Two times a day (BID) | INTRAVENOUS | Status: DC
  Administered 2020-02-01 – 2020-02-06 (×6): 3 mL via INTRAVENOUS

## 2020-02-01 MED ORDER — MORPHINE SULFATE (PF) 2 MG/ML IV SOLN
2.0000 mg | INTRAVENOUS | Status: DC | PRN
  Administered 2020-02-02 – 2020-02-06 (×22): 2 mg via INTRAVENOUS
  Filled 2020-02-01 (×22): qty 1

## 2020-02-01 MED ORDER — BREXPIPRAZOLE 1 MG PO TABS
1.0000 mg | ORAL_TABLET | Freq: Every day | ORAL | Status: DC
  Administered 2020-02-02 – 2020-02-05 (×5): 1 mg via ORAL
  Filled 2020-02-01 (×6): qty 1

## 2020-02-01 MED ORDER — OXCARBAZEPINE 150 MG PO TABS
150.0000 mg | ORAL_TABLET | Freq: Two times a day (BID) | ORAL | Status: DC
  Administered 2020-02-02 – 2020-02-06 (×10): 150 mg via ORAL
  Filled 2020-02-01 (×12): qty 1

## 2020-02-01 MED ORDER — NICOTINE 21 MG/24HR TD PT24: 21.0000 mg | MEDICATED_PATCH | Freq: Every day | TRANSDERMAL | Status: DC

## 2020-02-01 MED ORDER — AMLODIPINE BESYLATE 5 MG PO TABS
5.0000 mg | ORAL_TABLET | Freq: Every day | ORAL | Status: DC
  Administered 2020-02-01 – 2020-02-06 (×6): 5 mg via ORAL
  Filled 2020-02-01 (×6): qty 1

## 2020-02-01 MED ORDER — OXYCODONE-ACETAMINOPHEN 10-325 MG PO TABS: 1.0000 | ORAL_TABLET | ORAL | Status: DC | PRN

## 2020-02-01 MED ORDER — LEVOTHYROXINE SODIUM 50 MCG PO TABS
50.0000 ug | ORAL_TABLET | Freq: Every day | ORAL | Status: DC
  Administered 2020-02-02 – 2020-02-06 (×5): 50 ug via ORAL
  Filled 2020-02-01 (×5): qty 1

## 2020-02-01 MED ORDER — BUSPIRONE HCL 15 MG PO TABS
7.5000 mg | ORAL_TABLET | Freq: Three times a day (TID) | ORAL | Status: DC
  Administered 2020-02-02 – 2020-02-06 (×14): 7.5 mg via ORAL
  Filled 2020-02-01 (×14): qty 1

## 2020-02-01 MED ORDER — OXYCODONE-ACETAMINOPHEN 5-325 MG PO TABS
1.0000 | ORAL_TABLET | ORAL | Status: DC | PRN
  Administered 2020-02-01 – 2020-02-06 (×20): 1 via ORAL
  Filled 2020-02-01 (×20): qty 1

## 2020-02-01 MED ORDER — ACETAMINOPHEN 325 MG PO TABS: 650.0000 mg | ORAL_TABLET | Freq: Four times a day (QID) | ORAL | Status: DC | PRN

## 2020-02-01 MED ORDER — LINACLOTIDE 145 MCG PO CAPS
290.0000 ug | ORAL_CAPSULE | Freq: Every day | ORAL | Status: DC
  Administered 2020-02-02 – 2020-02-06 (×5): 290 ug via ORAL
  Filled 2020-02-01 (×5): qty 2

## 2020-02-01 MED ORDER — INSULIN ASPART 100 UNIT/ML ~~LOC~~ SOLN
0.0000 [IU] | SUBCUTANEOUS | Status: DC
  Administered 2020-02-02 – 2020-02-06 (×5): 1 [IU] via SUBCUTANEOUS

## 2020-02-01 MED ORDER — ATENOLOL 25 MG PO TABS
50.0000 mg | ORAL_TABLET | Freq: Every day | ORAL | Status: DC
  Administered 2020-02-02 – 2020-02-06 (×5): 50 mg via ORAL
  Filled 2020-02-01 (×6): qty 2

## 2020-02-01 MED ORDER — VENLAFAXINE HCL ER 75 MG PO CP24
75.0000 mg | ORAL_CAPSULE | Freq: Every day | ORAL | Status: DC
  Administered 2020-02-02 – 2020-02-06 (×5): 75 mg via ORAL
  Filled 2020-02-01 (×5): qty 1

## 2020-02-01 MED ORDER — BENZTROPINE MESYLATE 1 MG PO TABS
1.0000 mg | ORAL_TABLET | Freq: Every day | ORAL | Status: DC
  Administered 2020-02-02 – 2020-02-05 (×5): 1 mg via ORAL
  Filled 2020-02-01 (×6): qty 1

## 2020-02-01 MED ORDER — DOCUSATE SODIUM 100 MG PO CAPS
100.0000 mg | ORAL_CAPSULE | Freq: Two times a day (BID) | ORAL | Status: DC
  Administered 2020-02-01 – 2020-02-06 (×10): 100 mg via ORAL
  Filled 2020-02-01 (×10): qty 1

## 2020-02-01 MED ORDER — ACETAMINOPHEN 650 MG RE SUPP: 650.0000 mg | Freq: Four times a day (QID) | RECTAL | Status: DC | PRN

## 2020-02-01 MED ORDER — SODIUM CHLORIDE 0.9 % IV SOLN: INTRAVENOUS | Status: AC

## 2020-02-01 MED ORDER — ONDANSETRON HCL 4 MG PO TABS: 4.0000 mg | ORAL_TABLET | Freq: Four times a day (QID) | ORAL | Status: DC | PRN

## 2020-02-01 MED ORDER — ONDANSETRON HCL 4 MG/2ML IJ SOLN
4.0000 mg | Freq: Four times a day (QID) | INTRAMUSCULAR | Status: DC | PRN
  Administered 2020-02-05: 4 mg via INTRAVENOUS
  Filled 2020-02-01: qty 2

## 2020-02-01 MED ORDER — FLUCONAZOLE 100 MG PO TABS
400.0000 mg | ORAL_TABLET | Freq: Once | ORAL | Status: AC
  Administered 2020-02-01: 400 mg via ORAL
  Filled 2020-02-01: qty 4

## 2020-02-01 MED ORDER — LITHIUM CARBONATE 300 MG PO CAPS
300.0000 mg | ORAL_CAPSULE | Freq: Every day | ORAL | Status: DC
  Administered 2020-02-02 – 2020-02-06 (×5): 300 mg via ORAL
  Filled 2020-02-01 (×5): qty 1

## 2020-02-01 MED ORDER — SODIUM CHLORIDE 0.9 % IV SOLN
3.0000 g | Freq: Four times a day (QID) | INTRAVENOUS | Status: DC
  Administered 2020-02-01 – 2020-02-05 (×16): 3 g via INTRAVENOUS
  Filled 2020-02-01: qty 3
  Filled 2020-02-01: qty 8
  Filled 2020-02-01 (×2): qty 3
  Filled 2020-02-01: qty 8
  Filled 2020-02-01: qty 3
  Filled 2020-02-01: qty 8
  Filled 2020-02-01 (×11): qty 3

## 2020-02-01 MED ORDER — HYDROXYZINE HCL 25 MG PO TABS
25.0000 mg | ORAL_TABLET | Freq: Three times a day (TID) | ORAL | Status: DC
  Administered 2020-02-02 – 2020-02-06 (×14): 50 mg via ORAL
  Filled 2020-02-01 (×14): qty 2

## 2020-02-01 MED ORDER — CLONIDINE HCL 0.1 MG PO TABS
0.1000 mg | ORAL_TABLET | Freq: Three times a day (TID) | ORAL | Status: DC
  Administered 2020-02-01 – 2020-02-06 (×13): 0.1 mg via ORAL
  Filled 2020-02-01 (×14): qty 1

## 2020-02-01 MED ORDER — FLUCONAZOLE 100 MG PO TABS
200.0000 mg | ORAL_TABLET | ORAL | Status: DC
  Administered 2020-02-01 – 2020-02-05 (×5): 200 mg via ORAL
  Filled 2020-02-01 (×4): qty 2

## 2020-02-01 MED ORDER — OXYCODONE ER 13.5 MG PO C12A: 13.5000 mg | EXTENDED_RELEASE_CAPSULE | Freq: Two times a day (BID) | ORAL | Status: DC

## 2020-02-01 MED ORDER — PANTOPRAZOLE SODIUM 40 MG PO TBEC
40.0000 mg | DELAYED_RELEASE_TABLET | Freq: Every day | ORAL | Status: DC
  Administered 2020-02-02 – 2020-02-06 (×5): 40 mg via ORAL
  Filled 2020-02-01 (×5): qty 1

## 2020-02-01 MED ORDER — FENOFIBRATE 160 MG PO TABS
160.0000 mg | ORAL_TABLET | Freq: Every day | ORAL | Status: DC
  Administered 2020-02-01 – 2020-02-06 (×6): 160 mg via ORAL
  Filled 2020-02-01 (×6): qty 1

## 2020-02-01 NOTE — Progress Notes (Signed)
Pharmacy Antibiotic Note  Jermaine [name] is a 39 y.o. male admitted on 02/01/2020 with 3-wk history R jaw swelling and pain; pt has been on amoxicillin and clindamycin with no improvement.  Pt underwent extraction of R mandibular abscessed tooth on 01/29/20. Pharmacy has been consulted for Unasyn dosing for mandibular abscess.  WBC 9.4, afebrile; Scr 0.99, CrCl 137.1 ml/min   Plan: Unasyn 3 gm IV Q 6 hrs Monitor WBC, temp, clinical improvement, renal function    Temp (24hrs), Avg:98.4 F (36.9 C), Min:98.1 F (36.7 C), Max:98.6 F (37 C)  Recent Labs  Lab 01/28/20 1239 01/29/20 0725  WBC 11.5* 8.2  CREATININE 0.95 0.99    Estimated Creatinine Clearance: 137.1 mL/min (by C-G formula based on SCr of 0.99 mg/dL).    Allergies  Allergen Reactions  . Hydrocodone Itching   Microbiology results: 5/10 abscess R mandible cx: rare Candida dubliniensis, rare Prevotella buccae (beta-lactamase negative) 5/9 flu A, flu B, COVID: negative 5/10 HIV screen: negative  Thank you for allowing pharmacy to be a part of this patient's care.  Gillermina Hu, PharmD, BCPS, [facility] Clinical Pharmacist 02/01/2020 8:38 PM

## 2020-02-01 NOTE — Progress Notes (Signed)
Pt admitted to 6N15 via ambulatory direct admit from home.  Pt states had oral surgery on right lower tooth on 01/29/20 [name]. Pt has a dressing on the right lower jaw area and states he has a penrose drain on that side. Pt states he is a type 2 diabetic and takes metformin. Oriented to room and dept. Pt alert and oriented.  HOB up 45 degrees.

## 2020-02-01 NOTE — Progress Notes (Signed)
Dr. Lorin Mercy texted that pt is here in 6N15.
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
## 2020-02-02 ENCOUNTER — Inpatient Hospital Stay (HOSPITAL_COMMUNITY): Payer: Medicaid Other

## 2020-02-02 LAB — CBC WITH DIFFERENTIAL/PLATELET
Abs Immature Granulocytes: 0.05 10*3/uL (ref 0.00–0.07)
Basophils Absolute: 0 10*3/uL (ref 0.0–0.1)
Basophils Relative: 0 %
Eosinophils Absolute: 0.3 10*3/uL (ref 0.0–0.5)
Eosinophils Relative: 4 %
HCT: 38.2 % — ABNORMAL LOW (ref 39.0–52.0)
Hemoglobin: 13.2 g/dL (ref 13.0–17.0)
Immature Granulocytes: 1 %
Lymphocytes Relative: 30 %
Lymphs Abs: 2.7 10*3/uL (ref 0.7–4.0)
MCH: 33.2 pg (ref 26.0–34.0)
MCHC: 34.6 g/dL (ref 30.0–36.0)
MCV: 96.2 fL (ref 80.0–100.0)
Monocytes Absolute: 0.6 10*3/uL (ref 0.1–1.0)
Monocytes Relative: 6 %
Neutro Abs: 5.2 10*3/uL (ref 1.7–7.7)
Neutrophils Relative %: 59 %
Platelets: 307 10*3/uL (ref 150–400)
RBC: 3.97 MIL/uL — ABNORMAL LOW (ref 4.22–5.81)
RDW: 12.6 % (ref 11.5–15.5)
WBC: 9 10*3/uL (ref 4.0–10.5)
nRBC: 0 % (ref 0.0–0.2)

## 2020-02-02 LAB — RAPID URINE DRUG SCREEN, HOSP PERFORMED
Amphetamines: NOT DETECTED
Barbiturates: NOT DETECTED
Benzodiazepines: NOT DETECTED
Cocaine: NOT DETECTED
Opiates: POSITIVE — AB
Tetrahydrocannabinol: POSITIVE — AB

## 2020-02-02 LAB — COMPREHENSIVE METABOLIC PANEL
ALT: 17 U/L (ref 0–44)
AST: 18 U/L (ref 15–41)
Albumin: 3.5 g/dL (ref 3.5–5.0)
Alkaline Phosphatase: 65 U/L (ref 38–126)
Anion gap: 12 (ref 5–15)
BUN: 6 mg/dL (ref 6–20)
CO2: 23 mmol/L (ref 22–32)
Calcium: 9.2 mg/dL (ref 8.9–10.3)
Chloride: 105 mmol/L (ref 98–111)
Creatinine, Ser: 1.02 mg/dL (ref 0.61–1.24)
GFR calc Af Amer: >60 mL/min (ref >60)
GFR calc non Af Amer: >60 mL/min (ref >60)
Glucose, Bld: 140 mg/dL — ABNORMAL HIGH (ref 70–99)
Potassium: 3.8 mmol/L (ref 3.5–5.1)
Sodium: 140 mmol/L (ref 135–145)
Total Bilirubin: 0.4 mg/dL (ref 0.3–1.2)
Total Protein: 7 g/dL (ref 6.5–8.1)

## 2020-02-02 LAB — TSH: TSH: 3.85 u[IU]/mL (ref 0.350–4.500)

## 2020-02-02 LAB — GLUCOSE, CAPILLARY
Glucose-Capillary: 100 mg/dL — ABNORMAL HIGH (ref 70–99)
Glucose-Capillary: 127 mg/dL — ABNORMAL HIGH (ref 70–99)
Glucose-Capillary: 135 mg/dL — ABNORMAL HIGH (ref 70–99)
Glucose-Capillary: 80 mg/dL (ref 70–99)
Glucose-Capillary: 84 mg/dL (ref 70–99)
Glucose-Capillary: 87 mg/dL (ref 70–99)
Glucose-Capillary: 90 mg/dL (ref 70–99)

## 2020-02-02 LAB — PHOSPHORUS: Phosphorus: 4.3 mg/dL (ref 2.5–4.6)

## 2020-02-02 LAB — MRSA PCR SCREENING: MRSA by PCR: NEGATIVE

## 2020-02-02 LAB — SARS CORONAVIRUS 2 BY RT PCR (HOSPITAL ORDER, PERFORMED IN ~~LOC~~ HOSPITAL LAB): SARS Coronavirus 2: NEGATIVE

## 2020-02-02 LAB — MAGNESIUM: Magnesium: 2.3 mg/dL (ref 1.7–2.4)

## 2020-02-02 MED ORDER — IOHEXOL 300 MG/ML  SOLN
75.0000 mL | Freq: Once | INTRAMUSCULAR | Status: AC | PRN
  Administered 2020-02-02: 75 mL via INTRAVENOUS

## 2020-02-02 MED ORDER — CHLORHEXIDINE GLUCONATE 0.12 % MT SOLN
15.0000 mL | Freq: Two times a day (BID) | OROMUCOSAL | Status: DC
  Administered 2020-02-02 – 2020-02-06 (×9): 15 mL via OROMUCOSAL
  Filled 2020-02-02 (×9): qty 15

## 2020-02-02 MED ORDER — NICOTINE 14 MG/24HR TD PT24
14.0000 mg | MEDICATED_PATCH | Freq: Every day | TRANSDERMAL | Status: DC
  Administered 2020-02-02 – 2020-02-06 (×6): 14 mg via TRANSDERMAL
  Filled 2020-02-02 (×6): qty 1
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
## 2020-02-02 NOTE — Progress Notes (Signed)
PROGRESS NOTE    Jermaine [name]  [id number] DOB: [birth year] DOA: 02/01/2020 PCP: [name], MD   Brief Narrative:  Jermaine [name] is a 39 y.o. male with medical history significant of  bipolar, borderline personality disorder, CAD, DM2, GERD, HTN,Opioid dependence, tobacco abuse Seizure disorder. Presented with   Worsening pain and swelling of his right jaw was previously admitted from 5/9-5/10 for mandibular abscess associated with dental infection.  He had tooth extraction and R mandibular I&D by [name] on 5/10.  His symptoms initially gotten worse and he was seen in the emergency department on 11th but left AMA before being seen. He was seen in Dr. [name] office just prior to admission and seem to be doing worse.  Despite being on outpatient p.o. clindamycin since the 9th. [name] requested direct admission to [place] for for IV antibiotics repeat CT to evaluate if additional I&D is needed. Patient is diabetic and takes Metformin. Patient has Penrose drain in place Last month he had testicular surgery and has been taking doxycycline 2-3 weeks. Reports being compliant with clindamycin since previous discharge.   Assessment & Plan:   Active Problems:   Seizure-like activity (HCC)   GERD (gastroesophageal reflux disease)   Opioid dependence (HCC)   Tobacco abuse   Bipolar disorder current episode depressed ([place])   Hypertension   Mandibular abscess   Diabetes mellitus without complication ([place])    Subacute/recurrent mandibular abscess with failure of outpatient antibiotics. -Continue Unasyn per oral surgery  -Cultures remarkable for Candida, continue fluconazole as per admitting physician  - CT soft tissue neck with contrast to further evaluate for any drainable abscess -  showing some improvement - Previously notified oral surgery [name] through epic that patient has been admitted for follow-up  GERD (gastroesophageal reflux disease) chronic stable continue home  medications  Opioid dependence ([place])  -Continue short-term OxyIR only for breakthrough pain postoperatively -Continue home [facility]  Tobacco abuse  -Discussion at bedside about smoking cessation, patient currently not interested in quitting, nicotine patch offered  Bipolar disorder current episode depressed ([place])  -stable continue home medications check lithium level -0.09 -appears to be chronically low per hour labs, although this is the lowest it has been in years; unclear if patient noncompliant or medication dose change  History of seizure disorder  - stable continue home medications(Lamictal, Trileptal) have not had seizures for years . Marland Kitchen Hypertension -stable resume home medications  Diabetes mellitus hold Metformin order sliding scale  Other plan as per orders.   DVT prophylaxis: SCDs (low risk, perioperative, and recommend early ambulation) Code Status: Full Family Communication: None  Status is: Inpatient  Dispo: The patient is from: Home              Anticipated d/c is to: Home              Anticipated d/c date is: Likely 61 to 72 hours              Patient currently not medically stable for discharge given ongoing need for IV antibiotics, pain control, close monitoring in the setting of worsening infection and outpatient failure of antibiotics.  Possible need for repeat imaging or evaluation and surgery per oral surgery  Consultants:   [name], oral surgery  Antimicrobials:  Vancomycin, ceftriaxone  Subjective: No acute issues or events overnight, patient indicates ongoing jaw pain but moderately improved from previous.  Denies fevers, chills, nausea, vomiting, diarrhea, constipation.  Objective: Vitals:  02/01/20 1844 02/01/20 2029 02/02/20 0025 02/02/20 0401  BP: (!) 152/96 137/88 (!) 160/88 118/89  Pulse: 94 86 77 70  Resp: 20 18 17 18   Temp: 98.1 F (36.7 C) 98.6 F (37 C) 98.4 F (36.9 C) (!) 97.5 F (36.4 C)  TempSrc: Oral Oral Oral Oral    SpO2: 100% 97% 98% 95%    Intake/Output Summary (Last 24 hours) at 02/02/2020 0739 Last data filed at 02/02/2020 0500 Gross per 24 hour  Intake 721.75 ml  Output --  Net 721.75 ml   There were no vitals filed for this visit.  Examination:  General:  Pleasantly resting in bed, No acute distress. HEENT: Large hard induration and swelling over the right lateral anterior mandible with subsequent edema and blanching erythema superior to the maxilla and inferiorly to the edge of the sternocleidomastoid. Sclerae nonicteric, noninjected.  Extraocular movements intact bilaterally. Neck:  Without mass or deformity.  Trachea is midline. Lungs:  Clear to auscultate bilaterally without rhonchi, wheeze, or rales. Heart:  Regular rate and rhythm.  Without murmurs, rubs, or gallops. Abdomen:  Soft, nontender, nondistended.  Without guarding or rebound. Extremities: Without cyanosis, clubbing, edema, or obvious deformity. Vascular:  Dorsalis pedis and posterior tibial pulses palpable bilaterally. Skin: Head and neck as above, otherwise warm and dry, no erythema, no ulcerations.   Data Reviewed: I have personally reviewed following labs and imaging studies  CBC: Recent Labs  Lab 01/28/20 1239 01/29/20 0725 02/01/20 2018 02/02/20 0014  WBC 11.5* 8.2 9.4 9.0  NEUTROABS 8.1*  --  5.8 5.2  HGB 12.2* 11.5* 13.0 13.2  HCT 36.6* 34.0* 38.0* 38.2*  MCV 99.5 98.8 96.0 96.2  PLT 197 197 317 [id number]   Basic Metabolic Panel: Recent Labs  Lab 01/28/20 1239 01/29/20 0725 02/01/20 2018 02/02/20 0014  NA 135 139 139 140  K 3.7 3.8 3.9 3.8  CL 101 108 106 105  CO2 24 25 24 23   GLUCOSE 87 94 106* 140*  BUN 9 5* 6 6  CREATININE 0.95 0.99 0.98 1.02  CALCIUM 8.7* 8.0* 9.2 9.2  MG  --   --   --  2.3  PHOS  --   --   --  4.3   GFR: Estimated Creatinine Clearance: 133.1 mL/min (by C-G formula based on SCr of 1.02 mg/dL). Liver Function Tests: Recent Labs  Lab 01/28/20 1239 02/01/20 2018  02/02/20 0014  AST 22 19 18   ALT 19 17 17   ALKPHOS 74 66 65  BILITOT 0.5 0.4 0.4  PROT 6.9 7.1 7.0  ALBUMIN 3.9 3.4* 3.5   No results for input(s): LIPASE, AMYLASE in the last 168 hours. No results for input(s): AMMONIA in the last 168 hours. Coagulation Profile: No results for input(s): INR, PROTIME in the last 168 hours. Cardiac Enzymes: No results for input(s): CKTOTAL, CKMB, CKMBINDEX, TROPONINI in the last 168 hours. BNP (last 3 results) No results for input(s): PROBNP in the last 8760 hours. HbA1C: Recent Labs    02/01/20 2024  HGBA1C 5.1   CBG: Recent Labs  Lab 01/29/20 1416 01/29/20 1641 02/01/20 2026 02/02/20 0021 02/02/20 0359  GLUCAP 72 88 97 135* 100*   Lipid Profile: No results for input(s): CHOL, HDL, LDLCALC, TRIG, CHOLHDL, LDLDIRECT in the last 72 hours. Thyroid Function Tests: Recent Labs    02/02/20 0014  TSH 3.850   Anemia Panel: No results for input(s): VITAMINB12, FOLATE, FERRITIN, TIBC, IRON, RETICCTPCT in the last 72 hours. Sepsis Labs: No results for input(s):  PROCALCITON, LATICACIDVEN in the last 168 hours.  Recent Results (from the past 240 hour(s))  Respiratory Panel by RT PCR (Flu A&B, Covid) - Nasopharyngeal Swab     Status: None   Collection Time: 01/28/20  5:25 PM   Specimen: Nasopharyngeal Swab  Result Value Ref Range Status   SARS Coronavirus 2 by RT PCR NEGATIVE NEGATIVE Final    Comment: (NOTE) SARS-CoV-2 target nucleic acids are NOT DETECTED. The SARS-CoV-2 RNA is generally detectable in upper respiratoy specimens during the acute phase of infection. The lowest concentration of SARS-CoV-2 viral copies this assay can detect is 131 copies/mL. A negative result does not preclude SARS-Cov-2 infection and should not be used as the sole basis for treatment or other patient management decisions. A negative result may occur with  improper specimen collection/handling, submission of specimen other than nasopharyngeal swab,  presence of viral mutation(s) within the areas targeted by this assay, and inadequate number of viral copies (<131 copies/mL). A negative result must be combined with clinical observations, patient history, and epidemiological information. The expected result is Negative. Fact Sheet for Patients:  PinkCheek.be Fact Sheet for Healthcare Providers:  GravelBags.it This test is not yet ap proved or cleared by the [place] FDA and  has been authorized for detection and/or diagnosis of SARS-CoV-2 by FDA under an Emergency Use Authorization (EUA). This EUA will remain  in effect (meaning this test can be used) for the duration of the COVID-19 declaration under Section 564(b)(1) of the Act, 21 U.S.C. section 360bbb-3(b)(1), unless the authorization is terminated or revoked sooner.    Influenza A by PCR NEGATIVE NEGATIVE Final   Influenza B by PCR NEGATIVE NEGATIVE Final    Comment: (NOTE) The Xpert Xpress SARS-CoV-2/FLU/RSV assay is intended as an aid in  the diagnosis of influenza from Nasopharyngeal swab specimens and  should not be used as a sole basis for treatment. Nasal washings and  aspirates are unacceptable for Xpert Xpress SARS-CoV-2/FLU/RSV  testing. Fact Sheet for Patients: PinkCheek.be Fact Sheet for Healthcare Providers: GravelBags.it This test is not yet approved or cleared by the [place] FDA and  has been authorized for detection and/or diagnosis of SARS-CoV-2 by  FDA under an Emergency Use Authorization (EUA). This EUA will remain  in effect (meaning this test can be used) for the duration of the  Covid-19 declaration under Section 564(b)(1) of the Act, 21  U.S.C. section 360bbb-3(b)(1), unless the authorization is  terminated or revoked. Performed at [facility], [address], [place], [place] [address]   Aerobic/Anaerobic Culture  (surgical/deep wound)     Status: None   Collection Time: 01/29/20  4:02 PM   Specimen: PATH Other; Tissue  Result Value Ref Range Status   Specimen Description ABSCESS RIGHT MANDIBLE  Final   Special Requests NONE  Final   Gram Stain   Final    ABUNDANT WBC PRESENT, PREDOMINANTLY PMN FEW YEAST RARE GRAM POSITIVE COCCI RARE GRAM POSITIVE RODS    Culture   Final    RARE CANDIDA DUBLINIENSIS RARE PREVOTELLA BUCCAE BETA LACTAMASE NEGATIVE Performed at [facility], [place] [address], [place], [place] [address]    Report Status 02/01/2020 FINAL  Final  SARS Coronavirus 2 by RT PCR (hospital order, performed in [facility] hospital lab) Nasopharyngeal Nasopharyngeal Swab     Status: None   Collection Time: 02/01/20 11:38 PM   Specimen: Nasopharyngeal Swab  Result Value Ref Range Status   SARS Coronavirus 2 NEGATIVE NEGATIVE Final  Comment: (NOTE) SARS-CoV-2 target nucleic acids are NOT DETECTED. The SARS-CoV-2 RNA is generally detectable in upper and lower respiratory specimens during the acute phase of infection. The lowest concentration of SARS-CoV-2 viral copies this assay can detect is 250 copies / mL. A negative result does not preclude SARS-CoV-2 infection and should not be used as the sole basis for treatment or other patient management decisions.  A negative result may occur with improper specimen collection / handling, submission of specimen other than nasopharyngeal swab, presence of viral mutation(s) within the areas targeted by this assay, and inadequate number of viral copies (<250 copies / mL). A negative result must be combined with clinical observations, patient history, and epidemiological information. Fact Sheet for Patients:   StrictlyIdeas.no Fact Sheet for Healthcare Providers: BankingDealers.co.za This test is not yet approved or cleared  by the [place] FDA and has been authorized for detection and/or  diagnosis of SARS-CoV-2 by FDA under an Emergency Use Authorization (EUA).  This EUA will remain in effect (meaning this test can be used) for the duration of the COVID-19 declaration under Section 564(b)(1) of the Act, 21 U.S.C. section 360bbb-3(b)(1), unless the authorization is terminated or revoked sooner. Performed at [facility], [place] [address], [place], [place] [address]   MRSA PCR Screening     Status: None   Collection Time: 02/01/20 11:50 PM  Result Value Ref Range Status   MRSA by PCR NEGATIVE NEGATIVE Final    Comment:        The GeneXpert MRSA Assay (FDA approved for NASAL specimens only), is one component of a comprehensive MRSA colonization surveillance program. It is not intended to diagnose MRSA infection nor to guide or monitor treatment for MRSA infections. Performed at [facility], [place] [address], [place], [place] [address]          Radiology Studies: CT SOFT TISSUE NECK W CONTRAST  Result Date: 02/02/2020 CLINICAL DATA:  Right jaw swelling EXAM: CT NECK WITH CONTRAST TECHNIQUE: Multidetector CT imaging of the neck was performed using the standard protocol following the bolus administration of intravenous contrast. CONTRAST:  34mL OMNIPAQUE IOHEXOL 300 MG/ML  SOLN COMPARISON:  01/28/2020 FINDINGS: Pharynx and larynx: Status post drainage of subperiosteal abscess along the right mandibular body. There is drain material extending to the right facial skin surface. There is persistent soft tissue swelling overlying the drainage site, but improved since the prior examination. Tooth 30 has been extracted. Pharynx and larynx are normal. Salivary glands: No inflammation, mass, or stone. Thyroid: Normal. Lymph nodes: None enlarged or abnormal density. Vascular: Negative. Limited intracranial: Negative. Visualized orbits: Negative. Mastoids and visualized paranasal sinuses: Clear. Skeleton: No acute or aggressive process. Upper chest: Negative. Other: None.  IMPRESSION: Status post drainage of subperiosteal abscess along the right mandibular body. Persistent soft tissue swelling overlying the drainage site, but improved since the prior examination. Electronically Signed   By: Ulyses Jarred M.D.   On: 02/02/2020 00:50        Scheduled Meds: . amLODipine  5 mg Oral Daily  . atenolol  50 mg Oral Daily  . benztropine  1 mg Oral QHS  . brexpiprazole  1 mg Oral QHS  . busPIRone  7.5 mg Oral TID  . cloNIDine  0.1 mg Oral TID  . docusate sodium  100 mg Oral BID  . fenofibrate  160 mg Oral Daily  . fluconazole  200 mg Oral Q24H  . gabapentin  600 mg Oral TID  .  hydrOXYzine  25-50 mg Oral TID  . insulin aspart  0-9 Units Subcutaneous Q4H  . lamoTRIgine  200 mg Oral Daily  . levothyroxine  50 mcg Oral Q0600  . linaclotide  290 mcg Oral QAC breakfast  . lithium carbonate  300 mg Oral Q breakfast  . lithium carbonate  600 mg Oral QHS  . nicotine  14 mg Transdermal Daily  . OXcarbazepine  150 mg Oral BID  . oxyCODONE  10 mg Oral Q12H  . pantoprazole  40 mg Oral Daily  . sodium chloride flush  3 mL Intravenous Q12H  . venlafaxine XR  75 mg Oral Q breakfast   Continuous Infusions: . ampicillin-sulbactam (UNASYN) IV 3 g (02/02/20 0253)     LOS: 1 day   Time spent: 34min  Bentlee Drier C Welden Hausmann, DO [facility]  If 7PM-7AM, please contact night-coverage [email or web address]  02/02/2020, 7:39 AM

## 2020-02-03 LAB — COMPREHENSIVE METABOLIC PANEL
ALT: 14 U/L (ref 0–44)
AST: 17 U/L (ref 15–41)
Albumin: 3 g/dL — ABNORMAL LOW (ref 3.5–5.0)
Alkaline Phosphatase: 51 U/L (ref 38–126)
Anion gap: 6 (ref 5–15)
BUN: 5 mg/dL — ABNORMAL LOW (ref 6–20)
CO2: 26 mmol/L (ref 22–32)
Calcium: 8.7 mg/dL — ABNORMAL LOW (ref 8.9–10.3)
Chloride: 107 mmol/L (ref 98–111)
Creatinine, Ser: 1.18 mg/dL (ref 0.61–1.24)
GFR calc Af Amer: >60 mL/min (ref >60)
GFR calc non Af Amer: >60 mL/min (ref >60)
Glucose, Bld: 125 mg/dL — ABNORMAL HIGH (ref 70–99)
Potassium: 4.4 mmol/L (ref 3.5–5.1)
Sodium: 139 mmol/L (ref 135–145)
Total Bilirubin: 0.2 mg/dL — ABNORMAL LOW (ref 0.3–1.2)
Total Protein: 6.4 g/dL — ABNORMAL LOW (ref 6.5–8.1)

## 2020-02-03 LAB — GLUCOSE, CAPILLARY
Glucose-Capillary: 100 mg/dL — ABNORMAL HIGH (ref 70–99)
Glucose-Capillary: 138 mg/dL — ABNORMAL HIGH (ref 70–99)
Glucose-Capillary: 82 mg/dL (ref 70–99)
Glucose-Capillary: 90 mg/dL (ref 70–99)
Glucose-Capillary: 96 mg/dL (ref 70–99)
Glucose-Capillary: 96 mg/dL (ref 70–99)

## 2020-02-03 LAB — CBC
HCT: 35.9 % — ABNORMAL LOW (ref 39.0–52.0)
Hemoglobin: 11.9 g/dL — ABNORMAL LOW (ref 13.0–17.0)
MCH: 32.7 pg (ref 26.0–34.0)
MCHC: 33.1 g/dL (ref 30.0–36.0)
MCV: 98.6 fL (ref 80.0–100.0)
Platelets: 243 10*3/uL (ref 150–400)
RBC: 3.64 MIL/uL — ABNORMAL LOW (ref 4.22–5.81)
RDW: 12.6 % (ref 11.5–15.5)
WBC: 6.5 10*3/uL (ref 4.0–10.5)
nRBC: 0 % (ref 0.0–0.2)

## 2020-02-03 NOTE — Progress Notes (Signed)
PROGRESS NOTE    [name]  [id number] DOB: 19-Feb-1981 DOA: 02/01/2020 PCP: [name], MD   Brief Narrative:  Pearletha Furl Cannonis a 39 y.o.malewith medical history significant of bipolar, borderline personality disorder, CAD, DM2, GERD, HTN,Opioid dependence, tobacco abuse Seizure disorder. Presented withWorsening pain and swelling of his right jaw was previously admitted from 5/9-5/10 for mandibular abscess associated with dental infection. He had tooth extraction and R mandibular I&D by [name] on 5/10.His symptoms initially gotten worse and he was seen in the emergency department on 11th but left AMA before being seen. He was seenin Dr. [name] office just prior to admission and seem to be doing worse. Despite being on outpatient p.o. clindamycin since the 9th.[name] requested direct admission to [facility] for for IV antibiotics repeat CT to evaluate if additional I&D is needed. Patient is diabetic and takes Metformin. Patient has Penrose drain in place Last month he had testicular surgery and has been taking doxycycline 2-3 weeks. Reports being compliant with clindamycin since previous discharge.   Assessment & Plan:   Active Problems:   Seizure-like activity (HCC)   GERD (gastroesophageal reflux disease)   Opioid dependence (HCC)   Tobacco abuse   Bipolar disorder current episode depressed ([name])   Hypertension   Mandibular abscess   Diabetes mellitus without complication ([name])   Subacute/recurrent mandibular abscess with failure of outpatient antibiotics. -Continue Unasyn per oral surgery  -Cultures remarkable for Candida, continue fluconazole as per admitting physician  - CT soft tissue neck with contrast to further evaluate for any drainable abscess-  showing some improvement - Oral surgery following  GERD (gastroesophageal reflux disease)chronic stable continue home medications  Opioid dependence(HCC) -Continue short-term OxyIR only for  breakthrough pain postoperatively -Continue home [facility]  Tobacco abuse -Discussion at bedside about smoking cessation, patient currently not interested in quitting, nicotine patch offered  Bipolar disorder current episode depressed ([name]) -stable continue home medications check lithium level -0.09 -appears to be chronically low per hour labs, although this is the lowest it has been in years; unclear if patient noncompliant or medication dose change  History of seizure disorder  - stable continue home medications(Lamictal, Trileptal) have not had seizures for years . [place] KitchenHypertension -stable resume home medications  Diabetes mellitushold Metformin order sliding scale Other plan as per orders.  DVT prophylaxis: SCD/Compression stockings  Code Status: full    Code Status Orders  (From admission, onward)         Start     Ordered   02/01/20 2001  Full code  Continuous     02/01/20 2002        Code Status History    Date Active Date Inactive Code Status Order ID Comments User Context   01/28/2020 1951 01/29/2020 2351 Full Code [id number]  [name], DO Inpatient   02/26/2019 1455 03/03/2019 1444 Full Code [id number]  [name], NP Inpatient   09/10/2018 1824 09/11/2018 1630 Full Code [id number]  Ward, [name], PA-C ED   08/25/2018 1815 09/02/2018 2155 Full Code [id number]  [name], NP Inpatient   02/08/2018 1556 02/15/2018 1927 Full Code [id number]  [name], MD ED   Advance Care Planning Activity     Family Communication: none present  Disposition Plan:   Status is: Inpatient  Dispo: The patient is from: Home  Anticipated d/c is to: Home  Anticipated d/c date is: Likely 36 to 72 hours  Patient currently not medically stable for discharge given ongoing  need for IV antibiotics, pain control, close monitoring in the setting of worsening infection and outpatient failure of antibiotics.  Possible need for  repeat imaging or evaluation and surgery per oral surgery Consults called: oral surgery Admission status: Inpatient   Consultants:   as above  Procedures:  CT SOFT TISSUE NECK W CONTRAST  Result Date: 02/02/2020 CLINICAL DATA:  Right jaw swelling EXAM: CT NECK WITH CONTRAST TECHNIQUE: Multidetector CT imaging of the neck was performed using the standard protocol following the bolus administration of intravenous contrast. CONTRAST:  92mL OMNIPAQUE IOHEXOL 300 MG/ML  SOLN COMPARISON:  01/28/2020 FINDINGS: Pharynx and larynx: Status post drainage of subperiosteal abscess along the right mandibular body. There is drain material extending to the right facial skin surface. There is persistent soft tissue swelling overlying the drainage site, but improved since the prior examination. Tooth 30 has been extracted. Pharynx and larynx are normal. Salivary glands: No inflammation, mass, or stone. Thyroid: Normal. Lymph nodes: None enlarged or abnormal density. Vascular: Negative. Limited intracranial: Negative. Visualized orbits: Negative. Mastoids and visualized paranasal sinuses: Clear. Skeleton: No acute or aggressive process. Upper chest: Negative. Other: None. IMPRESSION: Status post drainage of subperiosteal abscess along the right mandibular body. Persistent soft tissue swelling overlying the drainage site, but improved since the prior examination. Electronically Signed   By: Ulyses Jarred M.D.   On: 02/02/2020 00:50   CT Soft Tissue Neck W Contrast  Result Date: 01/28/2020 CLINICAL DATA:  Facial abscess, suspected odontogenic origin; neck abscess, deep tissue. Additional history provided: Patient has been on amoxicillin for the last 3 weeks since tooth broke, now with swelling to right side of face. EXAM: CT NECK WITH CONTRAST TECHNIQUE: Multidetector CT imaging of the neck was performed using the standard protocol following the bolus administration of intravenous contrast. CONTRAST:  56mL OMNIPAQUE  IOHEXOL 300 MG/ML  SOLN COMPARISON:  Neck CT 10/22/2005. FINDINGS: Pharynx and larynx: Poor dentition with multiple absent, carious and fractured teeth. There are also multifocal periapical lucencies. Most notably, there is prominent periapical lucency surrounding the right mandibular first molar with associated inferomedial cortical breakthrough (series 3, image 51) (series 5, image 38). Immediately adjacent, there is a hypodense peripherally enhancing collection along the superficial aspect of the right mandible measuring 1.3 x 1.2 cm consistent with abscess (series 2, image 49) (series 4, image 31). There is extensive surrounding right perimandibular phlegmonous change. There is also prominent right perimandibular inflammatory stranding and edema which extends inferiorly within the neck (greater on the right). Salivary glands: The submandibular glands are edematous with surrounding inflammatory stranding (greater on the right). The parotid glands are unremarkable. Thyroid: Unremarkable. Lymph nodes: Right upper cervical lymphadenopathy, likely reactive. Vascular: The major vascular structures of the neck are patent. Limited intracranial: No acute abnormality identified. Visualized orbits: Visualized orbits show no acute finding. Mastoids and visualized paranasal sinuses: Mild ethmoid and maxillary sinus mucosal thickening. No significant mastoid effusion. Skeleton: No acute bony abnormality or aggressive osseous lesion. Upper chest: Patchy airspace disease within the imaged right lung likely reflecting multifocal pneumonia. IMPRESSION: Prominent periapical lucency surrounding the right mandibular first molar with associated inferomedial cortical breakthrough. Immediately adjacent, there is a 1.3 cm abscess along the superficial aspect of the right mandible. There is extensive surrounding right perimandibular phlegmon. Associated prominent right perimandibular cellulitis changes which extend inferiorly within the  neck. Patchy airspace disease within the imaged right lung likely reflecting pneumonia. Radiographic follow-up to resolution recommended. Mild ethmoid and maxillary sinus mucosal thickening. Electronically Signed  By: Kellie Simmering DO   On: 01/28/2020 13:58     Antimicrobials:   vanc and ceftrixone   Currently on unasyn >5/13   Subjective: One drain removed this am, feels "ok" No acute events ovenright  Objective: Vitals:   02/02/20 1438 02/02/20 1949 02/03/20 0414 02/03/20 1241  BP: 105/62 117/89 (!) 110/97 (!) 128/95  Pulse: 72 66 63 69  Resp: 18 15 16 18   Temp: 98.9 F (37.2 C) 98.1 F (36.7 C) (!) 97.3 F (36.3 C) 97.8 F (36.6 C)  TempSrc: Oral Oral Oral Oral  SpO2: 98% 98% 97% 100%    Intake/Output Summary (Last 24 hours) at 02/03/2020 1303 Last data filed at 02/03/2020 0600 Gross per 24 hour  Intake 1257 ml  Output --  Net 1257 ml   There were no vitals filed for this visit.  Examination:  General exam: Appears calm and comfortable  Respiratory system: Clear to auscultation. Respiratory effort normal. Cardiovascular system: S1 & S2 heard, RRR. No JVD, murmurs, rubs, gallops or clicks. No pedal edema. Gastrointestinal system: Abdomen is nondistended, soft and nontender. No organomegaly or masses felt. Normal bowel sounds heard. Central nervous system: Alert and oriented. No focal neurological deficits. Extremities: Symmetric 5 x 5 power. Skin: No rashes, lesions or ulcers, multiple tatoos, neck drain in place no s/sx of infection Psychiatry: Judgement and insight appear normal. Mood & affect appropriate.     Data Reviewed: I have personally reviewed following labs and imaging studies  CBC: Recent Labs  Lab 01/28/20 1239 01/29/20 0725 02/01/20 2018 02/02/20 0014 02/03/20 0355  WBC 11.5* 8.2 9.4 9.0 6.5  NEUTROABS 8.1*  --  5.8 5.2  --   HGB 12.2* 11.5* 13.0 13.2 11.9*  HCT 36.6* 34.0* 38.0* 38.2* 35.9*  MCV 99.5 98.8 96.0 96.2 98.6  PLT 197 197  317 307 0000000   Basic Metabolic Panel: Recent Labs  Lab 01/28/20 1239 01/29/20 0725 02/01/20 2018 02/02/20 0014 02/03/20 0355  NA 135 139 139 140 139  K 3.7 3.8 3.9 3.8 4.4  CL 101 108 106 105 107  CO2 24 25 24 23 26   GLUCOSE 87 94 106* 140* 125*  BUN 9 5* 6 6 5*  CREATININE 0.95 0.99 0.98 1.02 1.18  CALCIUM 8.7* 8.0* 9.2 9.2 8.7*  MG  --   --   --  2.3  --   PHOS  --   --   --  4.3  --    GFR: Estimated Creatinine Clearance: 115 mL/min (by C-G formula based on SCr of 1.18 mg/dL). Liver Function Tests: Recent Labs  Lab 01/28/20 1239 02/01/20 2018 02/02/20 0014 02/03/20 0355  AST 22 19 18 17   ALT 19 17 17 14   ALKPHOS 74 66 65 51  BILITOT 0.5 0.4 0.4 0.2*  PROT 6.9 7.1 7.0 6.4*  ALBUMIN 3.9 3.4* 3.5 3.0*   No results for input(s): LIPASE, AMYLASE in the last 168 hours. No results for input(s): AMMONIA in the last 168 hours. Coagulation Profile: No results for input(s): INR, PROTIME in the last 168 hours. Cardiac Enzymes: No results for input(s): CKTOTAL, CKMB, CKMBINDEX, TROPONINI in the last 168 hours. BNP (last 3 results) No results for input(s): PROBNP in the last 8760 hours. HbA1C: Recent Labs    02/01/20 2024  HGBA1C 5.1   CBG: Recent Labs  Lab 02/02/20 1950 02/02/20 2357 02/03/20 0413 02/03/20 0810 02/03/20 1238  GLUCAP 127* 90 100* 138* 82   Lipid Profile: No results for input(s): CHOL,  HDL, LDLCALC, TRIG, CHOLHDL, LDLDIRECT in the last 72 hours. Thyroid Function Tests: Recent Labs    02/02/20 0014  TSH 3.850   Anemia Panel: No results for input(s): VITAMINB12, FOLATE, FERRITIN, TIBC, IRON, RETICCTPCT in the last 72 hours. Sepsis Labs: No results for input(s): PROCALCITON, LATICACIDVEN in the last 168 hours.  Recent Results (from the past 240 hour(s))  Respiratory Panel by RT PCR (Flu A&B, Covid) - Nasopharyngeal Swab     Status: None   Collection Time: 01/28/20  5:25 PM   Specimen: Nasopharyngeal Swab  Result Value Ref Range Status    SARS Coronavirus 2 by RT PCR NEGATIVE NEGATIVE Final    Comment: (NOTE) SARS-CoV-2 target nucleic acids are NOT DETECTED. The SARS-CoV-2 RNA is generally detectable in upper respiratoy specimens during the acute phase of infection. The lowest concentration of SARS-CoV-2 viral copies this assay can detect is 131 copies/mL. A negative result does not preclude SARS-Cov-2 infection and should not be used as the sole basis for treatment or other patient management decisions. A negative result may occur with  improper specimen collection/handling, submission of specimen other than nasopharyngeal swab, presence of viral mutation(s) within the areas targeted by this assay, and inadequate number of viral copies (<131 copies/mL). A negative result must be combined with clinical observations, patient history, and epidemiological information. The expected result is Negative. Fact Sheet for Patients:  PinkCheek.be Fact Sheet for Healthcare Providers:  GravelBags.it This test is not yet ap proved or cleared by the [place] FDA and  has been authorized for detection and/or diagnosis of SARS-CoV-2 by FDA under an Emergency Use Authorization (EUA). This EUA will remain  in effect (meaning this test can be used) for the duration of the COVID-19 declaration under Section 564(b)(1) of the Act, 21 U.S.C. section 360bbb-3(b)(1), unless the authorization is terminated or revoked sooner.    Influenza A by PCR NEGATIVE NEGATIVE Final   Influenza B by PCR NEGATIVE NEGATIVE Final    Comment: (NOTE) The Xpert Xpress SARS-CoV-2/FLU/RSV assay is intended as an aid in  the diagnosis of influenza from Nasopharyngeal swab specimens and  should not be used as a sole basis for treatment. Nasal washings and  aspirates are unacceptable for Xpert Xpress SARS-CoV-2/FLU/RSV  testing. Fact Sheet for Patients: PinkCheek.be Fact  Sheet for Healthcare Providers: GravelBags.it This test is not yet approved or cleared by the [place] FDA and  has been authorized for detection and/or diagnosis of SARS-CoV-2 by  FDA under an Emergency Use Authorization (EUA). This EUA will remain  in effect (meaning this test can be used) for the duration of the  Covid-19 declaration under Section 564(b)(1) of the Act, 21  U.S.C. section 360bbb-3(b)(1), unless the authorization is  terminated or revoked. Performed at [facility], [address], [place], [place] [address]   Aerobic/Anaerobic Culture (surgical/deep wound)     Status: None   Collection Time: 01/29/20  4:02 PM   Specimen: PATH Other; Tissue  Result Value Ref Range Status   Specimen Description ABSCESS RIGHT MANDIBLE  Final   Special Requests NONE  Final   Gram Stain   Final    ABUNDANT WBC PRESENT, PREDOMINANTLY PMN FEW YEAST RARE GRAM POSITIVE COCCI RARE GRAM POSITIVE RODS    Culture   Final    RARE CANDIDA DUBLINIENSIS RARE PREVOTELLA BUCCAE BETA LACTAMASE NEGATIVE Performed at [facility], [place] [address] [place], [place] [address]    Report Status 02/01/2020 FINAL  Final  Culture, blood (Routine  X 2) w Reflex to ID Panel     Status: None (Preliminary result)   Collection Time: 02/01/20  8:18 PM   Specimen: BLOOD  Result Value Ref Range Status   Specimen Description BLOOD LEFT ANTECUBITAL  Final   Special Requests   Final    BOTTLES DRAWN AEROBIC AND ANAEROBIC Blood Culture adequate volume   Culture   Final    NO GROWTH 2 DAYS Performed at [facility], [address], [place], [place] [address]    Report Status PENDING  Incomplete  Culture, blood (Routine X 2) w Reflex to ID Panel     Status: None (Preliminary result)   Collection Time: 02/01/20  8:24 PM   Specimen: BLOOD  Result Value Ref Range Status   Specimen Description BLOOD RIGHT ANTECUBITAL  Final   Special Requests   Final    BOTTLES DRAWN  AEROBIC AND ANAEROBIC Blood Culture results may not be optimal due to an inadequate volume of blood received in culture bottles   Culture   Final    NO GROWTH 2 DAYS Performed at [facility], [place] [address], [place], [place] [address]    Report Status PENDING  Incomplete  SARS Coronavirus 2 by RT PCR (hospital order, performed in [facility] hospital lab) Nasopharyngeal Nasopharyngeal Swab     Status: None   Collection Time: 02/01/20 11:38 PM   Specimen: Nasopharyngeal Swab  Result Value Ref Range Status   SARS Coronavirus 2 NEGATIVE NEGATIVE Final    Comment: (NOTE) SARS-CoV-2 target nucleic acids are NOT DETECTED. The SARS-CoV-2 RNA is generally detectable in upper and lower respiratory specimens during the acute phase of infection. The lowest concentration of SARS-CoV-2 viral copies this assay can detect is 250 copies / mL. A negative result does not preclude SARS-CoV-2 infection and should not be used as the sole basis for treatment or other patient management decisions.  A negative result may occur with improper specimen collection / handling, submission of specimen other than nasopharyngeal swab, presence of viral mutation(s) within the areas targeted by this assay, and inadequate number of viral copies (<250 copies / mL). A negative result must be combined with clinical observations, patient history, and epidemiological information. Fact Sheet for Patients:   StrictlyIdeas.no Fact Sheet for Healthcare Providers: BankingDealers.co.za This test is not yet approved or cleared  by the [place] FDA and has been authorized for detection and/or diagnosis of SARS-CoV-2 by FDA under an Emergency Use Authorization (EUA).  This EUA will remain in effect (meaning this test can be used) for the duration of the COVID-19 declaration under Section 564(b)(1) of the Act, 21 U.S.C. section 360bbb-3(b)(1), unless the authorization is  terminated or revoked sooner. Performed at [facility], [place] [address], [place], [place] [address]   MRSA PCR Screening     Status: None   Collection Time: 02/01/20 11:50 PM  Result Value Ref Range Status   MRSA by PCR NEGATIVE NEGATIVE Final    Comment:        The GeneXpert MRSA Assay (FDA approved for NASAL specimens only), is one component of a comprehensive MRSA colonization surveillance program. It is not intended to diagnose MRSA infection nor to guide or monitor treatment for MRSA infections. Performed at [facility], [place] [address] [place], [place] [address]          Radiology Studies: CT SOFT TISSUE NECK W CONTRAST  Result Date: 02/02/2020 CLINICAL DATA:  Right jaw swelling  EXAM: CT NECK WITH CONTRAST TECHNIQUE: Multidetector CT imaging of the neck was performed using the standard protocol following the bolus administration of intravenous contrast. CONTRAST:  67mL OMNIPAQUE IOHEXOL 300 MG/ML  SOLN COMPARISON:  01/28/2020 FINDINGS: Pharynx and larynx: Status post drainage of subperiosteal abscess along the right mandibular body. There is drain material extending to the right facial skin surface. There is persistent soft tissue swelling overlying the drainage site, but improved since the prior examination. Tooth 30 has been extracted. Pharynx and larynx are normal. Salivary glands: No inflammation, mass, or stone. Thyroid: Normal. Lymph nodes: None enlarged or abnormal density. Vascular: Negative. Limited intracranial: Negative. Visualized orbits: Negative. Mastoids and visualized paranasal sinuses: Clear. Skeleton: No acute or aggressive process. Upper chest: Negative. Other: None. IMPRESSION: Status post drainage of subperiosteal abscess along the right mandibular body. Persistent soft tissue swelling overlying the drainage site, but improved since the prior examination. Electronically Signed   By: Ulyses Jarred M.D.   On: 02/02/2020 00:50        Scheduled  Meds: . amLODipine  5 mg Oral Daily  . atenolol  50 mg Oral Daily  . benztropine  1 mg Oral QHS  . brexpiprazole  1 mg Oral QHS  . busPIRone  7.5 mg Oral TID  . chlorhexidine  15 mL Mouth/Throat BID  . cloNIDine  0.1 mg Oral TID  . docusate sodium  100 mg Oral BID  . fenofibrate  160 mg Oral Daily  . fluconazole  200 mg Oral Q24H  . gabapentin  600 mg Oral TID  . hydrOXYzine  25-50 mg Oral TID  . insulin aspart  0-9 Units Subcutaneous Q4H  . lamoTRIgine  200 mg Oral Daily  . levothyroxine  50 mcg Oral Q0600  . linaclotide  290 mcg Oral QAC breakfast  . lithium carbonate  300 mg Oral Q breakfast  . lithium carbonate  600 mg Oral QHS  . nicotine  14 mg Transdermal Daily  . OXcarbazepine  150 mg Oral BID  . oxyCODONE  10 mg Oral Q12H  . pantoprazole  40 mg Oral Daily  . sodium chloride flush  3 mL Intravenous Q12H  . venlafaxine XR  75 mg Oral Q breakfast   Continuous Infusions: . ampicillin-sulbactam (UNASYN) IV 3 g (02/03/20 0854)     LOS: 2 days    Time spent: 67 min    Nicolette Bang, MD [facility]  If 7PM-7AM, please contact night-coverage  02/03/2020, 1:03 PM

## 2020-02-03 NOTE — Progress Notes (Signed)
Jermaine [name] PROGRESS NOTE:   SUBJECTIVE: Pain right jaw.  OBJECTIVE:  Vitals: Blood pressure (!) 110/97, pulse 63, temperature (!) 97.3 F (36.3 C), temperature source Oral, resp. rate 16, SpO2 97 %. Lab results: Results for orders placed or performed during the hospital encounter of 02/01/20 (from the past 24 hour(s))  Glucose, capillary     Status: None   Collection Time: 02/02/20 12:02 PM  Result Value Ref Range   Glucose-Capillary 84 70 - 99 mg/dL  Glucose, capillary     Status: None   Collection Time: 02/02/20  4:14 PM  Result Value Ref Range   Glucose-Capillary 87 70 - 99 mg/dL  Glucose, capillary     Status: Abnormal   Collection Time: 02/02/20  7:50 PM  Result Value Ref Range   Glucose-Capillary 127 (H) 70 - 99 mg/dL  Glucose, capillary     Status: None   Collection Time: 02/02/20 11:57 PM  Result Value Ref Range   Glucose-Capillary 90 70 - 99 mg/dL  CBC     Status: Abnormal   Collection Time: 02/03/20  3:55 AM  Result Value Ref Range   WBC 6.5 4.0 - 10.5 K/uL   RBC 3.64 (L) 4.22 - 5.81 MIL/uL   Hemoglobin 11.9 (L) 13.0 - 17.0 g/dL   HCT 35.9 (L) 39.0 - 52.0 %   MCV 98.6 80.0 - 100.0 fL   MCH 32.7 26.0 - 34.0 pg   MCHC 33.1 30.0 - 36.0 g/dL   RDW 12.6 11.5 - 15.5 %   Platelets 243 150 - 400 K/uL   nRBC 0.0 0.0 - 0.2 %  Comprehensive metabolic panel     Status: Abnormal   Collection Time: 02/03/20  3:55 AM  Result Value Ref Range   Sodium 139 135 - 145 mmol/L   Potassium 4.4 3.5 - 5.1 mmol/L   Chloride 107 98 - 111 mmol/L   CO2 26 22 - 32 mmol/L   Glucose, Bld 125 (H) 70 - 99 mg/dL   BUN 5 (L) 6 - 20 mg/dL   Creatinine, Ser 1.18 0.61 - 1.24 mg/dL   Calcium 8.7 (L) 8.9 - 10.3 mg/dL   Total Protein 6.4 (L) 6.5 - 8.1 g/dL   Albumin 3.0 (L) 3.5 - 5.0 g/dL   AST 17 15 - 41 U/L   ALT 14 0 - 44 U/L   Alkaline Phosphatase 51 38 - 126 U/L   Total Bilirubin 0.2 (L) 0.3 - 1.2 mg/dL   GFR calc non Af Amer >60 >60 mL/min   GFR calc Af Amer >60 >60 mL/min   Anion gap 6 5 - 15  Glucose, capillary     Status: Abnormal   Collection Time: 02/03/20  4:13 AM  Result Value Ref Range   Glucose-Capillary 100 (H) 70 - 99 mg/dL  Glucose, capillary     Status: Abnormal   Collection Time: 02/03/20  8:10 AM  Result Value Ref Range   Glucose-Capillary 138 (H) 70 - 99 mg/dL   Radiology Results: CT SOFT TISSUE NECK W CONTRAST  Result Date: 02/02/2020 CLINICAL DATA:  Right jaw swelling EXAM: CT NECK WITH CONTRAST TECHNIQUE: Multidetector CT imaging of the neck was performed using the standard protocol following the bolus administration of intravenous contrast. CONTRAST:  32mL OMNIPAQUE IOHEXOL 300 MG/ML  SOLN COMPARISON:  01/28/2020 FINDINGS: Pharynx and larynx: Status post drainage of subperiosteal abscess along the right mandibular body. There is drain material extending to the right facial skin surface. There is persistent soft  tissue swelling overlying the drainage site, but improved since the prior examination. Tooth 30 has been extracted. Pharynx and larynx are normal. Salivary glands: No inflammation, mass, or stone. Thyroid: Normal. Lymph nodes: None enlarged or abnormal density. Vascular: Negative. Limited intracranial: Negative. Visualized orbits: Negative. Mastoids and visualized paranasal sinuses: Clear. Skeleton: No acute or aggressive process. Upper chest: Negative. Other: None. IMPRESSION: Status post drainage of subperiosteal abscess along the right mandibular body. Persistent soft tissue swelling overlying the drainage site, but improved since the prior examination. Electronically Signed   By: Ulyses Jarred M.D.   On: 02/02/2020 00:50   General appearance: alert, cooperative and no distress Head: Normocephalic, without obvious abnormality, atraumatic Eyes: negative Nose: Nares normal. Septum midline. Mucosa normal. No drainage or sinus tenderness. Throat: Drain intact right mandible. no exudate. Maximum opening increased to 54mm. pharynx clear. Neck:  Right submandibular drain with minimal drainage. Edema/induration persists. Resp: clear to auscultation bilaterally Cardio: regular rate and rhythm, S1, S2 normal, no murmur, click, rub or gallop  ASSESSMENT: Improving s/p dental extraction, incision and drainage x2.   PLAN: Oral drain d/c'd bedside. Will d/c neck drain in am. Consider d/c  Home tomorrow.    Diona Browner 02/03/2020

## 2020-02-04 ENCOUNTER — Other Ambulatory Visit: Payer: Self-pay

## 2020-02-04 ENCOUNTER — Encounter (HOSPITAL_COMMUNITY): Payer: Self-pay | Admitting: Internal Medicine

## 2020-02-04 LAB — GLUCOSE, CAPILLARY
Glucose-Capillary: 115 mg/dL — ABNORMAL HIGH (ref 70–99)
Glucose-Capillary: 122 mg/dL — ABNORMAL HIGH (ref 70–99)
Glucose-Capillary: 63 mg/dL — ABNORMAL LOW (ref 70–99)
Glucose-Capillary: 66 mg/dL — ABNORMAL LOW (ref 70–99)
Glucose-Capillary: 80 mg/dL (ref 70–99)
Glucose-Capillary: 87 mg/dL (ref 70–99)
Glucose-Capillary: 88 mg/dL (ref 70–99)
Glucose-Capillary: 97 mg/dL (ref 70–99)

## 2020-02-04 LAB — CBC
HCT: 37.7 % — ABNORMAL LOW (ref 39.0–52.0)
Hemoglobin: 12.6 g/dL — ABNORMAL LOW (ref 13.0–17.0)
MCH: 33.2 pg (ref 26.0–34.0)
MCHC: 33.4 g/dL (ref 30.0–36.0)
MCV: 99.2 fL (ref 80.0–100.0)
Platelets: 259 10*3/uL (ref 150–400)
RBC: 3.8 MIL/uL — ABNORMAL LOW (ref 4.22–5.81)
RDW: 12.7 % (ref 11.5–15.5)
WBC: 6.5 10*3/uL (ref 4.0–10.5)
nRBC: 0 % (ref 0.0–0.2)

## 2020-02-04 LAB — COMPREHENSIVE METABOLIC PANEL
ALT: 17 U/L (ref 0–44)
AST: 17 U/L (ref 15–41)
Albumin: 3.3 g/dL — ABNORMAL LOW (ref 3.5–5.0)
Alkaline Phosphatase: 58 U/L (ref 38–126)
Anion gap: 8 (ref 5–15)
BUN: 8 mg/dL (ref 6–20)
CO2: 29 mmol/L (ref 22–32)
Calcium: 9.1 mg/dL (ref 8.9–10.3)
Chloride: 103 mmol/L (ref 98–111)
Creatinine, Ser: 1.13 mg/dL (ref 0.61–1.24)
GFR calc Af Amer: >60 mL/min (ref >60)
GFR calc non Af Amer: >60 mL/min (ref >60)
Glucose, Bld: 72 mg/dL (ref 70–99)
Potassium: 4.2 mmol/L (ref 3.5–5.1)
Sodium: 140 mmol/L (ref 135–145)
Total Bilirubin: 0.3 mg/dL (ref 0.3–1.2)
Total Protein: 6.6 g/dL (ref 6.5–8.1)

## 2020-02-04 NOTE — Progress Notes (Signed)
Jermaine [name], Jermaine DOA: 02/01/2020 PCP: [name], Jermaine   Brief Narrative:  Jermaine Furl Cannonis a 39 y.o.malewith medical history significant of [name], Jermaine [name], Jermaine [name], Jermaine [name], Jermaine [name], Jermaine [name],Jermaine [name], Jermaine abuse Seizuredisorder.Presented withWorsening pain and swelling of his right jaw waspreviouslyadmitted from 5/9-5/10 for mandibular abscess associated with dental infection. He had tooth extraction and R mandibular I&D by [name] on 5/10.His symptoms initially gotten worse and he was seen in the emergency department on 11th but leftAMAbefore being seen. He was seenin Dr. [name] officejust prior to admissionand seem to be doing worse. Despite being on outpatient p.o. clindamycinsince the 9th.[name] requested direct admission to [facility] for for IV antibiotics repeat CT to evaluate if additional I&D is needed. Patient is diabetic and takes Metformin. Patient has Penrose drain in placeLast month he had testicular surgery and has been taking doxycycline 2-3 weeks. Reports being compliant with clindamycin since previous discharge   Assessment & Plan:   Active Problems:   Seizure-like activity (HCC)   Jermaine [name] (gastroesophageal reflux disease)   Jermaine [name] (HCC)   Jermaine abuse   [name] [name] current episode depressed ([name])   Hypertension   Mandibular abscess   Diabetes mellitus without complication ([place])   Subacute/recurrent mandibular abscess with failure of outpatient antibiotics. -Continue Unasyn per oral surgeryFOR AN ADDITIONAL DAY -Cultures remarkable for Candida, continue fluconazole as per admitting physician -CT soft tissue neck with contrast to further evaluate for any drainable abscess-showing some improvement -Oral surgery following, anticipate d/c Monday with oral surg f/up for drain removal -Recommended augmentin on d/c for total of 14 days of abx  Jermaine [name]  (gastroesophageal reflux disease)chronic stable continue home medications  Jermaine [name](HCC) -Continue short-term OxyIR only for breakthrough pain postoperatively -Continue home [facility]  Jermaine abuse -Discussion at bedside about smoking cessation, patient currently not interested in quitting, nicotine patch offered  [name] [name] current episode depressed (Jermaine [name]) -stable continue home medications check lithium level -0.09-appears to be chronically low per hour labs, although this is the lowest it has been in years;unclear if patient noncompliant or medication dose change  History of seizure [name] -stable continue home medications(Lamictal, Trileptal)have not had seizures for years . Marland KitchenHypertension -stable resume home medications  Diabetes mellitushold Metformin order sliding scale Other plan as per orders.  DVT prophylaxis: SCD/Compression stockings  Code Status: full    Code Status Orders  (From admission, onward)         Start     Ordered   02/01/20 2001  Full code  Continuous     02/01/20 2002        Code Status History    Date Active Date Inactive Code Status Order ID Comments User Context   01/28/2020 1951 01/29/2020 2351 Full Code [id number]  Jermaine Mainland, DO Inpatient   02/26/2019 1455 03/03/2019 1444 Full Code [id number]  Jermaine Hal, NP Inpatient   09/10/2018 1824 09/11/2018 1630 Full Code [id number]  Ward, Jermaine Almond, PA-C ED   08/25/2018 1815 09/02/2018 2155 Full Code [id number]  Jermaine Slates, NP Inpatient   02/08/2018 1556 02/15/2018 1927 Full Code [id number]  Jermaine [name], Jermaine ED   Advance Care Planning Activity     Family Communication: noen present  Disposition Plan:   Status [id number]  Dispo: The patient is from:Home Anticipated d/c is [id number] Anticipated d/c date is: Likely 43 to 72 hours Patient currently notmedically stable for dischargegiven ongoing need for IV  antibiotics,  pain control, close monitoring in the setting of worsening infection and outpatient failure of antibiotics. Possible need for repeat imaging or evaluation and surgery per oral surgery Consults called: oral surgery Admission status: Inpatient   Consultants:   as above  Procedures:  CT SOFT TISSUE NECK W CONTRAST  Result Date: 02/02/2020 CLINICAL DATA:  Right jaw swelling EXAM: CT NECK WITH CONTRAST TECHNIQUE: Multidetector CT imaging of the neck was performed using the standard protocol following the bolus administration of intravenous contrast. CONTRAST:  55mL OMNIPAQUE IOHEXOL 300 MG/ML  SOLN COMPARISON:  01/28/2020 FINDINGS: Pharynx and larynx: Status post drainage of subperiosteal abscess along the right mandibular body. There is drain material extending to the right facial skin surface. There is persistent soft tissue swelling overlying the drainage site, but improved since the prior examination. Tooth 30 has been extracted. Pharynx and larynx are normal. Salivary glands: No inflammation, mass, or stone. Thyroid: Normal. Lymph nodes: None enlarged or abnormal density. Vascular: Negative. Limited intracranial: Negative. Visualized orbits: Negative. Mastoids and visualized paranasal sinuses: Clear. Skeleton: No acute or aggressive process. Upper chest: Negative. Other: None. IMPRESSION: Status post drainage of subperiosteal abscess along the right mandibular body. Persistent soft tissue swelling overlying the drainage site, but improved since the prior examination. Electronically Signed   By: Ulyses Jarred M.D.   On: 02/02/2020 00:50   CT Soft Tissue Neck W Contrast  Result Date: 01/28/2020 CLINICAL DATA:  Facial abscess, suspected odontogenic origin; neck abscess, deep tissue. Additional history provided: Patient has been on amoxicillin for the last 3 weeks since tooth broke, now with swelling to right side of face. EXAM: CT NECK WITH CONTRAST TECHNIQUE: Multidetector CT imaging of  the neck was performed using the standard protocol following the bolus administration of intravenous contrast. CONTRAST:  43mL OMNIPAQUE IOHEXOL 300 MG/ML  SOLN COMPARISON:  Neck CT 10/22/2005. FINDINGS: Pharynx and larynx: Poor dentition with multiple absent, carious and fractured teeth. There are also multifocal periapical lucencies. Most notably, there is prominent periapical lucency surrounding the right mandibular first molar with associated inferomedial cortical breakthrough (series 3, image 51) (series 5, image 38). Immediately adjacent, there is a hypodense peripherally enhancing collection along the superficial aspect of the right mandible measuring 1.3 x 1.2 cm consistent with abscess (series 2, image 49) (series 4, image 31). There is extensive surrounding right perimandibular phlegmonous change. There is also prominent right perimandibular inflammatory stranding and edema which extends inferiorly within the neck (greater on the right). Salivary glands: The submandibular glands are edematous with surrounding inflammatory stranding (greater on the right). The parotid glands are unremarkable. Thyroid: Unremarkable. Lymph nodes: Right upper cervical lymphadenopathy, likely reactive. Vascular: The major vascular structures of the neck are patent. Limited intracranial: No acute abnormality identified. Visualized orbits: Visualized orbits show no acute finding. Mastoids and visualized paranasal sinuses: Mild ethmoid and maxillary sinus mucosal thickening. No significant mastoid effusion. Skeleton: No acute bony abnormality or aggressive osseous lesion. Upper chest: Patchy airspace disease within the imaged right lung likely reflecting multifocal pneumonia. IMPRESSION: Prominent periapical lucency surrounding the right mandibular first molar with associated inferomedial cortical breakthrough. Immediately adjacent, there is a 1.3 cm abscess along the superficial aspect of the right mandible. There is extensive  surrounding right perimandibular phlegmon. Associated prominent right perimandibular cellulitis changes which extend inferiorly within the neck. Patchy airspace disease within the imaged right lung likely reflecting pneumonia. Radiographic follow-up to resolution recommended. Mild ethmoid and maxillary sinus mucosal thickening. Electronically Signed   By: Kellie Simmering DO  On: 01/28/2020 13:58     Antimicrobials:   vanc and ceftrixone   Currently on unasyn >5/13    Subjective: Reports right neck pain, no drainage or bleeding  Objective: Vitals:   02/03/20 1646 02/03/20 2007 02/04/20 0405 02/04/20 0700  BP: (!) 128/100 (!) 145/93 114/70   Pulse: 61 76 61   Resp: [name] [name] 16    Temp: 97.8 F (36.6 C) 97.7 F (36.5 C) 98.4 F (36.9 C)   TempSrc: Oral Oral Oral   SpO2: 97% 93% 95%   Weight:    116.1 kg  Height:    6\' 2"  (1.88 m)    Intake/Output Summary (Last 24 hours) at 02/04/2020 1208 Last data filed at 02/04/2020 0900 Gross per 24 hour  Intake 420 ml  Output --  Net 420 ml   Filed Weights   02/04/20 0700  Weight: 116.1 kg    Examination:  General exam: Appears calm and comfortable  Respiratory system: Clear to auscultation. Respiratory effort normal. Cardiovascular system: S1 & S2 heard, RRR. No JVD, murmurs, rubs, gallops or clicks. No pedal edema. Gastrointestinal system: Abdomen is nondistended, soft and nontender. No organomegaly or masses felt. Normal bowel sounds heard. Central nervous system: Alert and oriented. No focal neurological deficits. Extremities: wwp, no edema Skin: No new rashes, lesions or ulcers, multiple tatoos, neck drain in place no s/sx of infection, mildly ttp Psychiatry: Judgement and insight appear normal. Mood & affect appropriate.     Data Reviewed: I have personally reviewed following labs and imaging studies  CBC: Recent Labs  Lab 01/28/20 1239 01/28/20 1239 01/29/20 0725 02/01/20 2018 02/02/20 0014 02/03/20 0355 02/04/20 0338   WBC 11.5*   < > 8.2 9.4 9.0 6.5 6.5  NEUTROABS 8.1*  --   --  5.8 5.2  --   --   HGB 12.2*   < > 11.5* 13.0 13.2 11.9* 12.6*  HCT 36.6*   < > 34.0* 38.0* 38.2* 35.9* 37.7*  MCV 99.5   < > 98.8 96.0 96.2 98.6 99.2  PLT 197   < > 197 317 307 243 259   < > = values in this interval not displayed.   Basic Metabolic Panel: Recent Labs  Lab 01/29/20 0725 02/01/20 2018 02/02/20 0014 02/03/20 0355 02/04/20 0338  NA 139 139 140 139 140  K 3.8 3.9 3.8 4.4 4.2  CL 108 106 105 107 103  CO2 25 24 23 26 29   GLUCOSE 94 106* 140* 125* 72  BUN 5* 6 6 5* 8  CREATININE 0.99 0.98 1.02 1.[name] 1.13  CALCIUM 8.0* 9.2 9.2 8.7* 9.1  MG  --   --  2.3  --   --   PHOS  --   --  4.3  --   --    GFR: Estimated Creatinine Clearance: 120.1 mL/min (by C-G formula based on SCr of 1.13 mg/dL). Liver Function Tests: Recent Labs  Lab 01/28/20 1239 02/01/20 2018 02/02/20 0014 02/03/20 0355 02/04/20 0338  AST 22 19 [name] 17 17   ALT 19 17 17 14 17   ALKPHOS 74 66 65 51 58  BILITOT 0.5 0.4 0.4 0.2* 0.3  PROT 6.9 7.1 7.0 6.4* 6.6  ALBUMIN 3.9 3.4* 3.5 3.0* 3.3*   No results for input(s): LIPASE, AMYLASE in the last 168 hours. No results for input(s): AMMONIA in the last 168 hours. Coagulation Profile: No results for input(s): INR, PROTIME in the last 168 hours. Cardiac Enzymes: No results for input(s): CKTOTAL, CKMB, CKMBINDEX, TROPONINI in  the last 168 hours. BNP (last 3 results) No results for input(s): PROBNP in the last 8760 hours. HbA1C: Recent Labs    02/01/20 2024  HGBA1C 5.1   CBG: Recent Labs  Lab 02/04/20 0402 02/04/20 0438 02/04/20 0746 02/04/20 0804 02/04/20 1133  GLUCAP 66* 97 63* 88 80   Lipid Profile: No results for input(s): CHOL, HDL, LDLCALC, TRIG, CHOLHDL, LDLDIRECT in the last 72 hours. Thyroid Function Tests: Recent Labs    02/02/20 0014  TSH 3.850   Anemia Panel: No results for input(s): VITAMINB12, FOLATE, FERRITIN, TIBC, IRON, RETICCTPCT in the last 72  hours. Sepsis Labs: No results for input(s): PROCALCITON, LATICACIDVEN in the last 168 hours.  Recent Results (from the past 240 hour(s))  Respiratory Panel by RT PCR (Flu A&B, Covid) - Nasopharyngeal Swab     Status: None   Collection Time: 01/28/20  5:25 PM   Specimen: Nasopharyngeal Swab  Result Value Ref Range Status   SARS Coronavirus 2 by RT PCR NEGATIVE NEGATIVE Final    Comment: (NOTE) SARS-CoV-2 target nucleic acids are NOT DETECTED. The SARS-CoV-2 RNA is generally detectable in upper respiratoy specimens during the acute phase of infection. The lowest concentration of SARS-CoV-2 viral copies this assay can detect is 131 copies/mL. A negative result does not preclude SARS-Cov-2 infection and should not be used as the sole basis for treatment or other patient management decisions. A negative result may occur with  improper specimen collection/handling, submission of specimen other than nasopharyngeal swab, presence of viral mutation(s) within the areas targeted by this assay, and inadequate number of viral copies (<131 copies/mL). A negative result must be combined with clinical observations, patient history, and epidemiological information. The expected result is Negative. Fact Sheet for Patients:  PinkCheek.be Fact Sheet for Healthcare Providers:  GravelBags.it This test is not yet ap proved or cleared by the [place] FDA and  has been authorized for detection and/or diagnosis of SARS-CoV-2 by FDA under an Emergency Use Authorization (EUA). This EUA will remain  in effect (meaning this test can be used) for the duration of the COVID-19 declaration under Section 564(b)(1) of the Act, 21 U.S.C. section 360bbb-3(b)(1), unless the authorization is terminated or revoked sooner.    Influenza A by PCR NEGATIVE NEGATIVE Final   Influenza B by PCR NEGATIVE NEGATIVE Final    Comment: (NOTE) The Xpert Xpress  SARS-CoV-2/FLU/RSV assay is intended as an aid in  the diagnosis of influenza from Nasopharyngeal swab specimens and  should not be used as a sole basis for treatment. Nasal washings and  aspirates are unacceptable for Xpert Xpress SARS-CoV-2/FLU/RSV  testing. Fact Sheet for Patients: PinkCheek.be Fact Sheet for Healthcare Providers: GravelBags.it This test is not yet approved or cleared by the [place] FDA and  has been authorized for detection and/or diagnosis of SARS-CoV-2 by  FDA under an Emergency Use Authorization (EUA). This EUA will remain  in effect (meaning this test can be used) for the duration of the  Covid-19 declaration under Section 564(b)(1) of the Act, 21  U.S.C. section 360bbb-3(b)(1), unless the authorization is  terminated or revoked. Performed at [facility], [address] [place], [place] [address]   Aerobic/Anaerobic Culture (surgical/deep wound)     Status: None   Collection Time: 01/29/20  4:02 PM   Specimen: PATH Other; Tissue  Result Value Ref Range Status   Specimen Description ABSCESS RIGHT MANDIBLE  Final   Special Requests NONE  Final   Gram Stain   Final  ABUNDANT WBC PRESENT, PREDOMINANTLY PMN FEW YEAST RARE GRAM POSITIVE COCCI RARE GRAM POSITIVE RODS    Culture   Final    RARE CANDIDA DUBLINIENSIS RARE PREVOTELLA BUCCAE BETA LACTAMASE NEGATIVE Performed at [facility], [place] [address], [place], [place] [address]    Report Status 02/01/2020 FINAL  Final  Culture, blood (Routine X 2) w Reflex to ID Panel     Status: None (Preliminary result)   Collection Time: 02/01/20  8:[name] PM   Specimen: BLOOD  Result Value Ref Range Status   Specimen Description BLOOD LEFT ANTECUBITAL  Final   Special Requests   Final    BOTTLES DRAWN AEROBIC AND ANAEROBIC Blood Culture adequate volume   Culture   Final    NO GROWTH 3 DAYS Performed at [facility], [place] [address]  [place], [place] [address]    Report Status PENDING  Incomplete  Culture, blood (Routine X 2) w Reflex to ID Panel     Status: None (Preliminary result)   Collection Time: 02/01/20  8:24 PM   Specimen: BLOOD  Result Value Ref Range Status   Specimen Description BLOOD RIGHT ANTECUBITAL  Final   Special Requests   Final    BOTTLES DRAWN AEROBIC AND ANAEROBIC Blood Culture results may not be optimal due to an inadequate volume of blood received in culture bottles   Culture   Final    NO GROWTH 3 DAYS Performed at [facility], [place] [address] [place], [place] [address]    Report Status PENDING  Incomplete  SARS Coronavirus 2 by RT PCR (hospital order, performed in [facility] hospital lab) Nasopharyngeal Nasopharyngeal Swab     Status: None   Collection Time: 02/01/20 11:38 PM   Specimen: Nasopharyngeal Swab  Result Value Ref Range Status   SARS Coronavirus 2 NEGATIVE NEGATIVE Final    Comment: (NOTE) SARS-CoV-2 target nucleic acids are NOT DETECTED. The SARS-CoV-2 RNA is generally detectable in upper and lower respiratory specimens during the acute phase of infection. The lowest concentration of SARS-CoV-2 viral copies this assay can detect is 250 copies / mL. A negative result does not preclude SARS-CoV-2 infection and should not be used as the sole basis for treatment or other patient management decisions.  A negative result may occur with improper specimen collection / handling, submission of specimen other than nasopharyngeal swab, presence of viral mutation(s) within the areas targeted by this assay, and inadequate number of viral copies (<250 copies / mL). A negative result must be combined with clinical observations, patient history, and epidemiological information. Fact Sheet for Patients:   StrictlyIdeas.no Fact Sheet for Healthcare Providers: BankingDealers.co.za This test is not yet approved or cleared  by the [place]  FDA and has been authorized for detection and/or diagnosis of SARS-CoV-2 by FDA under an Emergency Use Authorization (EUA).  This EUA will remain in effect (meaning this test can be used) for the duration of the COVID-19 declaration under Section 564(b)(1) of the Act, 21 U.S.C. section 360bbb-3(b)(1), unless the authorization is terminated or revoked sooner. Performed at [facility], [place] [address] [place],  [address]   MRSA PCR Screening     Status: None   Collection Time: 02/01/20 11:50 PM  Result Value Ref Range Status   MRSA by PCR NEGATIVE NEGATIVE Final    Comment:        The GeneXpert MRSA Assay (FDA approved for NASAL specimens only), is one component of a comprehensive MRSA  colonization surveillance program. It is not intended to diagnose MRSA infection nor to guide or monitor treatment for MRSA infections. Performed at [facility], [place] [address], [place], [place] [address]          Radiology Studies: No results found.      Scheduled Meds: . amLODipine  5 mg Oral Daily  . atenolol  50 mg Oral Daily  . benztropine  1 mg Oral QHS  . brexpiprazole  1 mg Oral QHS  . busPIRone  7.5 mg Oral TID  . chlorhexidine  15 mL Mouth/Throat BID  . cloNIDine  0.1 mg Oral TID  . docusate sodium  100 mg Oral BID  . fenofibrate  160 mg Oral Daily  . fluconazole  200 mg Oral Q24H  . gabapentin  600 mg Oral TID  . hydrOXYzine  25-50 mg Oral TID  . insulin aspart  0-9 Units Subcutaneous Q4H  . lamoTRIgine  200 mg Oral Daily  . levothyroxine  50 mcg Oral Q0600  . linaclotide  290 mcg Oral QAC breakfast  . lithium carbonate  300 mg Oral Q breakfast  . lithium carbonate  600 mg Oral QHS  . nicotine  14 mg Transdermal Daily  . OXcarbazepine  150 mg Oral BID  . oxyCODONE  10 mg Oral Q12H  . pantoprazole  40 mg Oral Daily  . sodium chloride flush  3 mL Intravenous Q12H  . venlafaxine XR  75 mg Oral Q breakfast   Continuous Infusions: .  ampicillin-sulbactam (UNASYN) IV 3 g (02/04/20 [id number])     LOS: 3 days    Time spent: 48 min    Nicolette Bang, Jermaine [facility]  If 7PM-7AM, please contact night-coverage  02/04/2020, 12:08 PM

## 2020-02-04 NOTE — Progress Notes (Signed)
Jermaine [name] PROGRESS NOTE:   SUBJECTIVE: Pain right jaw in joint. Wants regular diet instead of soft.  OBJECTIVE:  Vitals: Blood pressure 114/70, pulse 61, temperature 98.4 F (36.9 C), temperature source Oral, resp. rate 16, height 6\' 2"  (1.88 m), weight 116.1 kg, SpO2 95 %. Lab results: Results for orders placed or performed during the hospital encounter of 02/01/20 (from the past 24 hour(s))  Glucose, capillary     Status: None   Collection Time: 02/03/20 12:38 PM  Result Value Ref Range   Glucose-Capillary 82 70 - 99 mg/dL  Glucose, capillary     Status: None   Collection Time: 02/03/20  4:43 PM  Result Value Ref Range   Glucose-Capillary 96 70 - 99 mg/dL  Glucose, capillary     Status: None   Collection Time: 02/03/20  8:05 PM  Result Value Ref Range   Glucose-Capillary 90 70 - 99 mg/dL  Glucose, capillary     Status: None   Collection Time: 02/03/20 11:46 PM  Result Value Ref Range   Glucose-Capillary 96 70 - 99 mg/dL  CBC     Status: Abnormal   Collection Time: 02/04/20  3:38 AM  Result Value Ref Range   WBC 6.5 4.0 - 10.5 K/uL   RBC 3.80 (L) 4.22 - 5.81 MIL/uL   Hemoglobin 12.6 (L) 13.0 - 17.0 g/dL   HCT 37.7 (L) 39.0 - 52.0 %   MCV 99.2 80.0 - 100.0 fL   MCH 33.2 26.0 - 34.0 pg   MCHC 33.4 30.0 - 36.0 g/dL   RDW 12.7 11.5 - 15.5 %   Platelets 259 150 - 400 K/uL   nRBC 0.0 0.0 - 0.2 %  Comprehensive metabolic panel     Status: Abnormal   Collection Time: 02/04/20  3:38 AM  Result Value Ref Range   Sodium 140 135 - 145 mmol/L   Potassium 4.2 3.5 - 5.1 mmol/L   Chloride 103 98 - 111 mmol/L   CO2 29 22 - 32 mmol/L   Glucose, Bld 72 70 - 99 mg/dL   BUN 8 6 - 20 mg/dL   Creatinine, Ser 1.13 0.61 - 1.24 mg/dL   Calcium 9.1 8.9 - 10.3 mg/dL   Total Protein 6.6 6.5 - 8.1 g/dL   Albumin 3.3 (L) 3.5 - 5.0 g/dL   AST 17 15 - 41 U/L   ALT 17 0 - 44 U/L   Alkaline Phosphatase 58 38 - 126 U/L   Total Bilirubin 0.3 0.3 - 1.2 mg/dL   GFR calc non Af Amer >60 >60  mL/min   GFR calc Af Amer >60 >60 mL/min   Anion gap 8 5 - 15  Glucose, capillary     Status: Abnormal   Collection Time: 02/04/20  4:02 AM  Result Value Ref Range   Glucose-Capillary 66 (L) 70 - 99 mg/dL  Glucose, capillary     Status: None   Collection Time: 02/04/20  4:38 AM  Result Value Ref Range   Glucose-Capillary 97 70 - 99 mg/dL  Glucose, capillary     Status: Abnormal   Collection Time: 02/04/20  7:46 AM  Result Value Ref Range   Glucose-Capillary 63 (L) 70 - 99 mg/dL  Glucose, capillary     Status: None   Collection Time: 02/04/20  8:04 AM  Result Value Ref Range   Glucose-Capillary 88 70 - 99 mg/dL   Radiology Results: No results found. General appearance: alert, cooperative and no distress Head: Normocephalic, without obvious  abnormality, atraumatic Eyes: negative Nose: Nares normal. Septum midline. Mucosa normal. No drainage or sinus tenderness. Throat: no trismus, edema, fluctuance, or purulence. Extraction site healing well. Neck: Drain intact with mild purulent drainage. Firm induration does not cross midline.  ASSESSMENT: Improving clinically. Still with drainage R neck.  PLAN: Continue IV antibiotics.  D/c tomorrow. Will d/c drain in office after discharge.    Diona Browner 02/04/2020

## 2020-02-04 NOTE — Progress Notes (Signed)
Hypoglycemic Event  CBG: 0409  Treatment: 4 oz juice/soda  Symptoms: None  Follow-up CBG:0438 Time CBG Result:97  Possible Reasons for Event: Unknown  Comments/MD notified none    [place], Marylene Land

## 2020-02-05 DIAGNOSIS — F3132 Bipolar disorder, current episode depressed, moderate: Secondary | ICD-10-CM

## 2020-02-05 DIAGNOSIS — M272 Inflammatory conditions of jaws: Principal | ICD-10-CM

## 2020-02-05 DIAGNOSIS — F1129 Opioid dependence with unspecified opioid-induced disorder: Secondary | ICD-10-CM

## 2020-02-05 DIAGNOSIS — Z720 Tobacco use: Secondary | ICD-10-CM

## 2020-02-05 DIAGNOSIS — E119 Type 2 diabetes mellitus without complications: Secondary | ICD-10-CM

## 2020-02-05 DIAGNOSIS — I10 Essential (primary) hypertension: Secondary | ICD-10-CM

## 2020-02-05 DIAGNOSIS — R569 Unspecified convulsions: Secondary | ICD-10-CM

## 2020-02-05 DIAGNOSIS — K219 Gastro-esophageal reflux disease without esophagitis: Secondary | ICD-10-CM

## 2020-02-05 LAB — COMPREHENSIVE METABOLIC PANEL
ALT: 21 U/L (ref 0–44)
AST: 23 U/L (ref 15–41)
Albumin: 3.5 g/dL (ref 3.5–5.0)
Alkaline Phosphatase: 55 U/L (ref 38–126)
Anion gap: 11 (ref 5–15)
BUN: 6 mg/dL (ref 6–20)
CO2: 29 mmol/L (ref 22–32)
Calcium: 9.2 mg/dL (ref 8.9–10.3)
Chloride: 101 mmol/L (ref 98–111)
Creatinine, Ser: 1.19 mg/dL (ref 0.61–1.24)
GFR calc Af Amer: >60 mL/min (ref >60)
GFR calc non Af Amer: >60 mL/min (ref >60)
Glucose, Bld: 96 mg/dL (ref 70–99)
Potassium: 4.5 mmol/L (ref 3.5–5.1)
Sodium: 141 mmol/L (ref 135–145)
Total Bilirubin: 0.5 mg/dL (ref 0.3–1.2)
Total Protein: 7 g/dL (ref 6.5–8.1)

## 2020-02-05 LAB — CBC
HCT: 39 % (ref 39.0–52.0)
Hemoglobin: 13.3 g/dL (ref 13.0–17.0)
MCH: 33.5 pg (ref 26.0–34.0)
MCHC: 34.1 g/dL (ref 30.0–36.0)
MCV: 98.2 fL (ref 80.0–100.0)
Platelets: 260 10*3/uL (ref 150–400)
RBC: 3.97 MIL/uL — ABNORMAL LOW (ref 4.22–5.81)
RDW: 12.7 % (ref 11.5–15.5)
WBC: 6.3 10*3/uL (ref 4.0–10.5)
nRBC: 0 % (ref 0.0–0.2)

## 2020-02-05 LAB — GLUCOSE, CAPILLARY
Glucose-Capillary: 93 mg/dL (ref 70–99)
Glucose-Capillary: 105 mg/dL — ABNORMAL HIGH (ref 70–99)
Glucose-Capillary: 96 mg/dL (ref 70–99)
Glucose-Capillary: 94 mg/dL (ref 70–99)
Glucose-Capillary: 135 mg/dL — ABNORMAL HIGH (ref 70–99)

## 2020-02-05 MED ORDER — AMOXICILLIN-POT CLAVULANATE ER 1000-62.5 MG PO TB12: 2.0000 | ORAL_TABLET | Freq: Two times a day (BID) | ORAL | Status: DC

## 2020-02-05 MED ORDER — AMOXICILLIN-POT CLAVULANATE 875-125 MG PO TABS
1.0000 | ORAL_TABLET | Freq: Two times a day (BID) | ORAL | Status: DC
  Administered 2020-02-05 – 2020-02-06 (×2): 1 via ORAL
  Filled 2020-02-05 (×2): qty 1

## 2020-02-05 MED ORDER — CIPROFLOXACIN HCL 500 MG PO TABS: 500.0000 mg | ORAL_TABLET | Freq: Two times a day (BID) | ORAL | Status: DC

## 2020-02-05 NOTE — Progress Notes (Signed)
PROGRESS NOTE  Jermaine [name] [id number] DOB: 1981-07-03 DOA: 02/01/2020 PCP: [name], MD  Brief History   Jermaine A Cannonis a 39 y.o.malewith medical history significant of bipolar, borderline personality disorder, CAD, DM2, GERD, HTN,Opioid dependence, tobacco abuse Seizuredisorder.Presented withWorsening pain and swelling of his right jaw waspreviouslyadmitted from 5/9-5/10 for mandibular abscess associated with dental infection. He had tooth extraction and R mandibular I&D by [name] on 5/10.His symptoms initially gotten worse and he was seen in the emergency department on 11th but leftAMAbefore being seen. He was seenin Dr. [name] officejust prior to admissionand seem to be doing worse. Despite being on outpatient p.o. clindamycinsince the 9th.[name] requested direct admission to [place] for for IV antibiotics repeat CT to evaluate if additional I&D is needed. Patient is diabetic and takes Metformin. Patient has Penrose drain in placeLast month he had testicular surgery and has been taking doxycycline 2-3 weeks. Reports being compliant with clindamycin since previous discharge  The patient has been converted from IV Unasyn to augmentin. Anticipate discharge to home tomorrow. To follow up with oral surgery as outpatient. Consultants  . Oral surgery  Procedures  . Prior I&D with drain placement  Antibiotics   Anti-infectives (From admission, onward)   Start     Dose/Rate Route Frequency Ordered Stop   02/05/20 2200  amoxicillin-clavulanate (AUGMENTIN XR) 1000-62.5 MG per 12 hr tablet 2 tablet     2 tablet Oral Every 12 hours 02/05/20 1643 02/14/20 2159   02/02/20 2200  fluconazole (DIFLUCAN) tablet 200 mg     200 mg Oral Every 24 hours 02/01/20 2144     02/01/20 2200  fluconazole (DIFLUCAN) tablet 400 mg     400 mg Oral  Once 02/01/20 2144 02/01/20 2200   02/01/20 2100  Ampicillin-Sulbactam (UNASYN) 3 g in sodium chloride 0.9 % 100 mL IVPB     3  g 200 mL/hr over 30 Minutes Intravenous Every 6 hours 02/01/20 2052      .   Subjective  The patient is complaining of continued pain and swelling.   Objective   Vitals:  Vitals:   02/05/20 1015 02/05/20 1540  BP: (!) 141/89 113/77  Pulse: 68 75  Resp:  16  Temp:  97.9 F (36.6 C)  SpO2:  99%    Exam:  Constitutional:  . The patient is awake, alert, and oriented x 3. No acute distress. ENMT:  . Right mandible is covered with a bandage, but visibly swollen. Respiratory:  . No increased work of breathing. . No wheezes, rales, or rhonchi . No tactile fremitus Cardiovascular:  . Regular rate and rhythm . No murmurs, ectopy, or gallups. . No lateral PMI. No thrills. Abdomen:  . Abdomen is soft, non-tender, non-distended . No hernias, masses, or organomegaly . Normoactive bowel sounds.  Musculoskeletal:  . No cyanosis, clubbing, or edema Skin:  . No rashes, lesions, ulcers . palpation of skin: no induration or nodules Neurologic:  . CN 2-12 intact . Sensation all 4 extremities intact Psychiatric:  . Mental status o Mood, affect appropriate o Orientation to person, place, time  . judgment and insight appear intact  I have personally reviewed the following:   Today's Data  . Vitals, CMP, CBC  Micro Data  . Blood cultures x 2: no growth  Scheduled Meds: . amLODipine  5 mg Oral Daily  . atenolol  50 mg Oral Daily  . benztropine  1 mg Oral QHS  . brexpiprazole  1 mg Oral QHS  .  busPIRone  7.5 mg Oral TID  . chlorhexidine  15 mL Mouth/Throat BID  . cloNIDine  0.1 mg Oral TID  . docusate sodium  100 mg Oral BID  . fenofibrate  160 mg Oral Daily  . fluconazole  200 mg Oral Q24H  . gabapentin  600 mg Oral TID  . hydrOXYzine  25-50 mg Oral TID  . insulin aspart  0-9 Units Subcutaneous Q4H  . lamoTRIgine  200 mg Oral Daily  . levothyroxine  50 mcg Oral Q0600  . linaclotide  290 mcg Oral QAC breakfast  . lithium carbonate  300 mg Oral Q breakfast  .  lithium carbonate  600 mg Oral QHS  . nicotine  14 mg Transdermal Daily  . OXcarbazepine  150 mg Oral BID  . oxyCODONE  10 mg Oral Q12H  . pantoprazole  40 mg Oral Daily  . sodium chloride flush  3 mL Intravenous Q12H  . venlafaxine XR  75 mg Oral Q breakfast   Continuous Infusions: . ampicillin-sulbactam (UNASYN) IV 3 g (02/05/20 1421)    Active Problems:   Seizure-like activity (HCC)   GERD (gastroesophageal reflux disease)   Opioid dependence (HCC)   Tobacco abuse   Bipolar disorder current episode depressed ([place])   Hypertension   Mandibular abscess   Diabetes mellitus without complication ([place])   LOS: 4 days   A & P   Subacute/recurrent mandibular abscess with failure of outpatient antibiotics: Continue Unasyn per oral surgeryFOR AN ADDITIONAL DAY. Cultures remarkable for Candida, continue fluconazole as per admitting physician. CT soft tissue neck with contrast to further evaluate for any drainable abscess-showing some improvement. Oral surgery following, anticipate d/c Monday with oral surg f/up for drain removal. Recommended augmentin on d/c for total of 14 days of abx  GERD (gastroesophageal reflux disease):Chronic and stable. Continue home medications.  Opioid dependence(HCC): Continue short-term OxyIR only for breakthrough pain postoperatively. Continue home Oxy ER.  Tobacco abuse: Discussion at bedside about smoking cessation, patient currently not interested in quitting, nicotine patch offered.  Bipolar disorder: Current episode depressed ([place]). Stable continue home medications check lithium level. 0.09appears to be chronically low per hour labs, although this is the lowest it has been in years;unclear if patient noncompliant or medication dose change  History of seizure disorder: Stable continue home medications(Lamictal, Trileptal)have not had seizures for years. . Hypertension: stable resume home medications.  Diabetes mellitus: hold Metformin  order sliding scale. Other plan as per orders.  I have seen and examined this patient myself.   DVT prophylaxis: SCD/Compression stockings  Code Status: full Family Communication: None available Disposition: Patient is from home. Anticipate discharge to home. Barriers to discharge: need to monitor as patient transitions to oral antibiotics as he has failed outpatient treatment prior to the admission.  Jessy Cybulski, DO [facility] Direct contact: see [email or web address]  7PM-7AM contact night coverage as above 02/05/2020, 4:40 PM  LOS: 4 days

## 2020-02-05 NOTE — Plan of Care (Signed)
  Problem: Education: Goal: Knowledge of General Education information will improve Description Including pain rating scale, medication(s)/side effects and non-pharmacologic comfort measures Outcome: Progressing   

## 2020-02-06 LAB — CULTURE, BLOOD (ROUTINE X 2)
Culture: NO GROWTH
Culture: NO GROWTH
Special Requests: ADEQUATE

## 2020-02-06 LAB — COMPREHENSIVE METABOLIC PANEL
ALT: 20 U/L (ref 0–44)
AST: 22 U/L (ref 15–41)
Albumin: 3.4 g/dL — ABNORMAL LOW (ref 3.5–5.0)
Alkaline Phosphatase: 48 U/L (ref 38–126)
Anion gap: 7 (ref 5–15)
BUN: 9 mg/dL (ref 6–20)
CO2: 29 mmol/L (ref 22–32)
Calcium: 9 mg/dL (ref 8.9–10.3)
Chloride: 103 mmol/L (ref 98–111)
Creatinine, Ser: 1.25 mg/dL — ABNORMAL HIGH (ref 0.61–1.24)
GFR calc Af Amer: >60 mL/min (ref >60)
GFR calc non Af Amer: >60 mL/min (ref >60)
Glucose, Bld: 94 mg/dL (ref 70–99)
Potassium: 4.6 mmol/L (ref 3.5–5.1)
Sodium: 139 mmol/L (ref 135–145)
Total Bilirubin: 0.2 mg/dL — ABNORMAL LOW (ref 0.3–1.2)
Total Protein: 6.3 g/dL — ABNORMAL LOW (ref 6.5–8.1)

## 2020-02-06 LAB — CBC
HCT: 36.9 % — ABNORMAL LOW (ref 39.0–52.0)
Hemoglobin: 12.3 g/dL — ABNORMAL LOW (ref 13.0–17.0)
MCH: 32.6 pg (ref 26.0–34.0)
MCHC: 33.3 g/dL (ref 30.0–36.0)
MCV: 97.9 fL (ref 80.0–100.0)
Platelets: 246 10*3/uL (ref 150–400)
RBC: 3.77 MIL/uL — ABNORMAL LOW (ref 4.22–5.81)
RDW: 12.7 % (ref 11.5–15.5)
WBC: 7.2 10*3/uL (ref 4.0–10.5)
nRBC: 0 % (ref 0.0–0.2)

## 2020-02-06 LAB — GLUCOSE, CAPILLARY
Glucose-Capillary: 124 mg/dL — ABNORMAL HIGH (ref 70–99)
Glucose-Capillary: 83 mg/dL (ref 70–99)
Glucose-Capillary: 89 mg/dL (ref 70–99)
Glucose-Capillary: 92 mg/dL (ref 70–99)

## 2020-02-06 MED ORDER — FLUCONAZOLE 200 MG PO TABS: 200.0000 mg | ORAL_TABLET | ORAL | 0 refills | Status: DC

## 2020-02-06 MED ORDER — NICOTINE 14 MG/24HR TD PT24: 14.0000 mg | MEDICATED_PATCH | Freq: Every day | TRANSDERMAL | 0 refills | Status: DC

## 2020-02-06 MED ORDER — OXYCODONE HCL 5 MG PO TABS: 5.0000 mg | ORAL_TABLET | ORAL | 0 refills | Status: DC | PRN

## 2020-02-06 MED ORDER — OXYCODONE HCL ER 10 MG PO T12A: 10.0000 mg | EXTENDED_RELEASE_TABLET | Freq: Two times a day (BID) | ORAL | 0 refills | Status: DC

## 2020-02-06 NOTE — Progress Notes (Signed)
Patient discharged to home. Verbalizes understanding of all discharge instructions including discharge medications. Patient is headed to Dr. [name] office for drain removal following discharge.

## 2020-02-06 NOTE — Discharge Summary (Signed)
Physician Discharge Summary  Jermaine [name] [id number] DOB: 1980/10/19 DOA: 02/01/2020  PCP: [name], MD  Admit date: 02/01/2020 Discharge date: 02/06/2020  Recommendations for Outpatient Follow-up:  1. Patient is discharged to home 2. He is to go directly to Dr. [name] office for removal of drain. 3. He is to follow up with oral surgery as directed. 4. He is to follow up with PCP in 7-10 days.  Discharge Diagnoses: Principal diagnosis is #1 1. Subacute mandibular access with failure of outpatient therapy. 2. GERD 3. Opioid dependence 4. Tobacco abuse 5. Bipolar Disorder 6. Seizure disorder 7. Hypertension 8. DM II  Discharge Condition: Fair  Disposition: Home  Diet recommendation: Heart healthy with modified carbohydrates  Filed Weights   02/04/20 0700  Weight: 116.1 kg    History of present illness:  Jermaine [name] is a 39 y.o. male with medical history significant of  bipolar, borderline personality disorder, CAD, DM2, GERD, HTN,Opioid dependence, tobacco abuse Seizure do   Presented with   Worsening pain and swelling of his right jaw  was admitted from 5/9-10 for mandibular abscess associated with dental infection.  He had tooth extraction and R mandibular I&D by [name] on 5/10.  His symptoms initially gotten worse and he was seen in the emergency department on 11th but left before being seen. He was seen in Dr. [name] office today and seem to be doing worse.  Despite being on outpatient p.o. clindamycin [name] requested direct admission to [facility] for for IV antibiotics repeat CT to evaluate if additional I&D is needed. Patient is diabetic and takes Metformin. Patient has Penrose drain in place  Last month he had testicular surgery and has been taking doxycycline 2-3 weeks Has been taking Clindamycin for the past few days since discharge  Hospital Course:  [place] a 39 y.o.malewith medical history significant of bipolar, borderline  personality disorder, CAD, DM2, GERD, HTN,Opioid dependence, tobacco abuse Seizuredisorder.Presented withWorsening pain and swelling of his right jaw waspreviouslyadmitted from 5/9-5/10 for mandibular abscess associated with dental infection. He had tooth extraction and R mandibular I&D by [name] on 5/10.His symptoms initially gotten worse and he was seen in the emergency department on 11th but leftAMAbefore being seen. He was seenin Dr. [name] officejust prior to admissionand seem to be doing worse. Despite being on outpatient p.o. clindamycinsince the 9th.[name] requested direct admission to [facility] for for IV antibiotics repeat CT to evaluate if additional I&D is needed. Patient is diabetic and takes Metformin. Patient has Penrose drain in placeLast month he had testicular surgery and has been taking doxycycline 2-3 weeks. Reports being compliant with clindamycin since previous discharge  The patient has been converted from IV Unasyn to augmentin. He has had no worsening of erythema or swelling, no fever, and no increase of leukocytosis with change to oral antibiotics. He will be discharged to home today. He is instructed to go directly to Dr. [name] office for removal of drain.   Today's assessment: S: The patient is resting comfortably. No new complaints. O: Vitals:  Vitals:   02/06/20 0352 02/06/20 0556  BP: 105/70   Pulse: 62   Resp: 15 17  Temp: 97.8 F (36.6 C)   SpO2: 96%    Exam:  Constitutional:   The patient is awake, alert, and oriented x 3. No acute distress. ENMT:   Right mandible is covered with a bandage, but visibly swollen. Respiratory:   No increased work of breathing.  No wheezes, rales,  or rhonchi  No tactile fremitus Cardiovascular:   Regular rate and rhythm  No murmurs, ectopy, or gallups.  No lateral PMI. No thrills. Abdomen:   Abdomen is soft, non-tender, non-distended  No hernias, masses, or  organomegaly  Normoactive bowel sounds.  Musculoskeletal:   No cyanosis, clubbing, or edema Skin:   No rashes, lesions, ulcers  palpation of skin: no induration or nodules Neurologic:   CN 2-12 intact  Sensation all 4 extremities intact Psychiatric:   Mental status ? Mood, affect appropriate ? Orientation to person, place, time   judgment and insight appear intact  Discharge Instructions  Discharge Instructions    Activity as tolerated - No restrictions   Complete by: As directed    Call MD for:  redness, tenderness, or signs of infection (pain, swelling, redness, odor or green/yellow discharge around incision site)   Complete by: As directed    Call MD for:  severe uncontrolled pain   Complete by: As directed    Call MD for:  temperature >100.4   Complete by: As directed    Discharge instructions   Complete by: As directed    Discharge to home Go directly to Dr. [name] office to have drain removed.  Follow up with oral surgery as directed. Follow up with PCP in 7-10 days.   Increase activity slowly   Complete by: As directed      Allergies as of 02/06/2020      Reactions   Hydrocodone Itching   Seroquel [quetiapine] Other (See Comments)   Causes RLS   Trazodone And Nefazodone Other (See Comments)   Causes RLS      Medication List    STOP taking these medications   bacitracin 500 UNIT/GM ointment   clotrimazole-betamethasone cream Commonly known as: Lotrisone   cyclobenzaprine 10 MG tablet Commonly known as: FLEXERIL   doxycycline 100 MG capsule Commonly known as: VIBRAMYCIN   ibuprofen 800 MG tablet Commonly known as: ADVIL   lidocaine 5 % Commonly known as: Lidoderm   meloxicam 15 MG tablet Commonly known as: MOBIC   metFORMIN 1000 MG tablet Commonly known as: GLUCOPHAGE   oxyCODONE-acetaminophen 10-325 MG tablet Commonly known as: PERCOCET   promethazine 12.5 MG tablet Commonly known as: PHENERGAN   QUEtiapine 400 MG  tablet Commonly known as: SEROQUEL   simvastatin 20 MG tablet Commonly known as: ZOCOR   tadalafil 20 MG tablet Commonly known as: CIALIS   venlafaxine XR 75 MG 24 hr capsule Commonly known as: EFFEXOR-XR   Xtampza ER 13.5 MG C12a Generic drug: oxyCODONE ER     TAKE these medications   amLODipine 5 MG tablet Commonly known as: NORVASC Take 1 tablet (5 mg total) by mouth daily.   amoxicillin-clavulanate 875-125 MG tablet Commonly known as: Augmentin Take 1 tablet by mouth 2 (two) times daily for 10 days.   atenolol 50 MG tablet Commonly known as: TENORMIN Take 50 mg by mouth daily.   benztropine 1 MG tablet Commonly known as: COGENTIN Take 1 mg by mouth at bedtime.   busPIRone 7.5 MG tablet Commonly known as: BUSPAR Take 7.5 mg by mouth 3 (three) times daily.   cloNIDine 0.1 MG tablet Commonly known as: CATAPRES Take 1 tablet (0.1 mg total) by mouth 3 (three) times daily.   desvenlafaxine 50 MG 24 hr tablet Commonly known as: PRISTIQ Take 50 mg by mouth daily.   fenofibrate 145 MG tablet Commonly known as: TRICOR Take 145 mg by mouth daily.   fluconazole 200  MG tablet Commonly known as: DIFLUCAN Take 1 tablet (200 mg total) by mouth daily.   gabapentin 600 MG tablet Commonly known as: NEURONTIN Take 600 mg by mouth 3 (three) times daily.   hydrOXYzine 25 MG capsule Commonly known as: VISTARIL Take 25-50 mg by mouth 3 (three) times daily.   lamoTRIgine 200 MG tablet Commonly known as: LAMICTAL Take 200 mg by mouth daily.   levothyroxine 50 MCG tablet Commonly known as: SYNTHROID Take 50 mcg by mouth daily before breakfast.   linaclotide 290 MCG Caps capsule Commonly known as: LINZESS Take 1 capsule (290 mcg total) by mouth daily. For constipation What changed: when to take this   lithium carbonate 300 MG capsule Take 1 tablet (300 mg) by mouth in the morning and 2 tablets (600 mg) at bedtime: mood stabilization   nicotine 14 mg/24hr  patch Commonly known as: NICODERM CQ - dosed in mg/24 hours Place 1 patch (14 mg total) onto the skin daily. Start taking on: Feb 07, 2020   omeprazole 20 MG capsule Commonly known as: PRILOSEC Take 1 capsule (20 mg total) by mouth daily. For acid reflux   OXcarbazepine 150 MG tablet Commonly known as: Trileptal Take 1 tablet (150 mg total) by mouth 2 (two) times daily.   oxyCODONE 10 mg 12 hr tablet Commonly known as: OXYCONTIN Take 1 tablet (10 mg total) by mouth every 12 (twelve) hours.   oxyCODONE 5 MG immediate release tablet Commonly known as: Oxy IR/ROXICODONE Take 1 tablet (5 mg total) by mouth every 4 (four) hours as needed for moderate pain.   Rexulti 1 MG Tabs tablet Generic drug: brexpiprazole Take 1 mg by mouth at bedtime.      Allergies  Allergen Reactions  . Hydrocodone Itching  . Seroquel [Quetiapine] Other (See Comments)    Causes RLS  . Trazodone And Nefazodone Other (See Comments)    Causes RLS    The results of significant diagnostics from this hospitalization (including imaging, microbiology, ancillary and laboratory) are listed below for reference.    Significant Diagnostic Studies: CT SOFT TISSUE NECK W CONTRAST  Result Date: 02/02/2020 CLINICAL DATA:  Right jaw swelling EXAM: CT NECK WITH CONTRAST TECHNIQUE: Multidetector CT imaging of the neck was performed using the standard protocol following the bolus administration of intravenous contrast. CONTRAST:  63mL OMNIPAQUE IOHEXOL 300 MG/ML  SOLN COMPARISON:  01/28/2020 FINDINGS: Pharynx and larynx: Status post drainage of subperiosteal abscess along the right mandibular body. There is drain material extending to the right facial skin surface. There is persistent soft tissue swelling overlying the drainage site, but improved since the prior examination. Tooth 30 has been extracted. Pharynx and larynx are normal. Salivary glands: No inflammation, mass, or stone. Thyroid: Normal. Lymph nodes: None enlarged  or abnormal density. Vascular: Negative. Limited intracranial: Negative. Visualized orbits: Negative. Mastoids and visualized paranasal sinuses: Clear. Skeleton: No acute or aggressive process. Upper chest: Negative. Other: None. IMPRESSION: Status post drainage of subperiosteal abscess along the right mandibular body. Persistent soft tissue swelling overlying the drainage site, but improved since the prior examination. Electronically Signed   By: Ulyses Jarred M.D.   On: 02/02/2020 00:50   CT Soft Tissue Neck W Contrast  Result Date: 01/28/2020 CLINICAL DATA:  Facial abscess, suspected odontogenic origin; neck abscess, deep tissue. Additional history provided: Patient has been on amoxicillin for the last 3 weeks since tooth broke, now with swelling to right side of face. EXAM: CT NECK WITH CONTRAST TECHNIQUE: Multidetector CT imaging of  the neck was performed using the standard protocol following the bolus administration of intravenous contrast. CONTRAST:  93mL OMNIPAQUE IOHEXOL 300 MG/ML  SOLN COMPARISON:  Neck CT 10/22/2005. FINDINGS: Pharynx and larynx: Poor dentition with multiple absent, carious and fractured teeth. There are also multifocal periapical lucencies. Most notably, there is prominent periapical lucency surrounding the right mandibular first molar with associated inferomedial cortical breakthrough (series 3, image 51) (series 5, image 38). Immediately adjacent, there is a hypodense peripherally enhancing collection along the superficial aspect of the right mandible measuring 1.3 x 1.2 cm consistent with abscess (series 2, image 49) (series 4, image 31). There is extensive surrounding right perimandibular phlegmonous change. There is also prominent right perimandibular inflammatory stranding and edema which extends inferiorly within the neck (greater on the right). Salivary glands: The submandibular glands are edematous with surrounding inflammatory stranding (greater on the right). The parotid  glands are unremarkable. Thyroid: Unremarkable. Lymph nodes: Right upper cervical lymphadenopathy, likely reactive. Vascular: The major vascular structures of the neck are patent. Limited intracranial: No acute abnormality identified. Visualized orbits: Visualized orbits show no acute finding. Mastoids and visualized paranasal sinuses: Mild ethmoid and maxillary sinus mucosal thickening. No significant mastoid effusion. Skeleton: No acute bony abnormality or aggressive osseous lesion. Upper chest: Patchy airspace disease within the imaged right lung likely reflecting multifocal pneumonia. IMPRESSION: Prominent periapical lucency surrounding the right mandibular first molar with associated inferomedial cortical breakthrough. Immediately adjacent, there is a 1.3 cm abscess along the superficial aspect of the right mandible. There is extensive surrounding right perimandibular phlegmon. Associated prominent right perimandibular cellulitis changes which extend inferiorly within the neck. Patchy airspace disease within the imaged right lung likely reflecting pneumonia. Radiographic follow-up to resolution recommended. Mild ethmoid and maxillary sinus mucosal thickening. Electronically Signed   By: Kellie Simmering DO   On: 01/28/2020 13:58    Microbiology: Recent Results (from the past 240 hour(s))  Respiratory Panel by RT PCR (Flu A&B, Covid) - Nasopharyngeal Swab     Status: None   Collection Time: 01/28/20  5:25 PM   Specimen: Nasopharyngeal Swab  Result Value Ref Range Status   SARS Coronavirus 2 by RT PCR NEGATIVE NEGATIVE Final    Comment: (NOTE) SARS-CoV-2 target nucleic acids are NOT DETECTED. The SARS-CoV-2 RNA is generally detectable in upper respiratoy specimens during the acute phase of infection. The lowest concentration of SARS-CoV-2 viral copies this assay can detect is 131 copies/mL. A negative result does not preclude SARS-Cov-2 infection and should not be used as the sole basis for treatment  or other patient management decisions. A negative result may occur with  improper specimen collection/handling, submission of specimen other than nasopharyngeal swab, presence of viral mutation(s) within the areas targeted by this assay, and inadequate number of viral copies (<131 copies/mL). A negative result must be combined with clinical observations, patient history, and epidemiological information. The expected result is Negative. Fact Sheet for Patients:  PinkCheek.be Fact Sheet for Healthcare Providers:  GravelBags.it This test is not yet ap proved or cleared by the [place] FDA and  has been authorized for detection and/or diagnosis of SARS-CoV-2 by FDA under an Emergency Use Authorization (EUA). This EUA will remain  in effect (meaning this test can be used) for the duration of the COVID-19 declaration under Section 564(b)(1) of the Act, 21 U.S.C. section 360bbb-3(b)(1), unless the authorization is terminated or revoked sooner.    Influenza A by PCR NEGATIVE NEGATIVE Final   Influenza B by PCR NEGATIVE NEGATIVE Final  Comment: (NOTE) The Xpert Xpress SARS-CoV-2/FLU/RSV assay is intended as an aid in  the diagnosis of influenza from Nasopharyngeal swab specimens and  should not be used as a sole basis for treatment. Nasal washings and  aspirates are unacceptable for Xpert Xpress SARS-CoV-2/FLU/RSV  testing. Fact Sheet for Patients: PinkCheek.be Fact Sheet for Healthcare Providers: GravelBags.it This test is not yet approved or cleared by the [place] FDA and  has been authorized for detection and/or diagnosis of SARS-CoV-2 by  FDA under an Emergency Use Authorization (EUA). This EUA will remain  in effect (meaning this test can be used) for the duration of the  Covid-19 declaration under Section 564(b)(1) of the Act, 21  U.S.C. section  360bbb-3(b)(1), unless the authorization is  terminated or revoked. Performed at [facility], [address] [place], [place] [address]   Aerobic/Anaerobic Culture (surgical/deep wound)     Status: None   Collection Time: 01/29/20  4:02 PM   Specimen: PATH Other; Tissue  Result Value Ref Range Status   Specimen Description ABSCESS RIGHT MANDIBLE  Final   Special Requests NONE  Final   Gram Stain   Final    ABUNDANT WBC PRESENT, PREDOMINANTLY PMN FEW YEAST RARE GRAM POSITIVE COCCI RARE GRAM POSITIVE RODS    Culture   Final    RARE CANDIDA DUBLINIENSIS RARE PREVOTELLA BUCCAE BETA LACTAMASE NEGATIVE Performed at [facility], [place] [address] [place], [place] [address]    Report Status 02/01/2020 FINAL  Final  Culture, blood (Routine X 2) w Reflex to ID Panel     Status: None   Collection Time: 02/01/20  8:18 PM   Specimen: BLOOD  Result Value Ref Range Status   Specimen Description BLOOD LEFT ANTECUBITAL  Final   Special Requests   Final    BOTTLES DRAWN AEROBIC AND ANAEROBIC Blood Culture adequate volume   Culture   Final    NO GROWTH 5 DAYS Performed at [facility], [place] [address] [place], [place] [address]    Report Status 02/06/2020 FINAL  Final  Culture, blood (Routine X 2) w Reflex to ID Panel     Status: None   Collection Time: 02/01/20  8:24 PM   Specimen: BLOOD  Result Value Ref Range Status   Specimen Description BLOOD RIGHT ANTECUBITAL  Final   Special Requests   Final    BOTTLES DRAWN AEROBIC AND ANAEROBIC Blood Culture results may not be optimal due to an inadequate volume of blood received in culture bottles   Culture   Final    NO GROWTH 5 DAYS Performed at [facility], [place] [name] [place], [place] [address]    Report Status 02/06/2020 FINAL  Final  SARS Coronavirus 2 by RT PCR (hospital order, performed in [facility] hospital lab) Nasopharyngeal Nasopharyngeal Swab     Status: None   Collection Time: 02/01/20 11:38 PM    Specimen: Nasopharyngeal Swab  Result Value Ref Range Status   SARS Coronavirus 2 NEGATIVE NEGATIVE Final    Comment: (NOTE) SARS-CoV-2 target nucleic acids are NOT DETECTED. The SARS-CoV-2 RNA is generally detectable in upper and lower respiratory specimens during the acute phase of infection. The lowest concentration of SARS-CoV-2 viral copies this assay can detect is 250 copies / mL. A negative result does not preclude SARS-CoV-2 infection and should not be used as the sole basis for treatment or other patient management decisions.  A negative result may occur with improper specimen collection / handling,  submission of specimen other than nasopharyngeal swab, presence of viral mutation(s) within the areas targeted by this assay, and inadequate number of viral copies (<250 copies / mL). A negative result must be combined with clinical observations, patient history, and epidemiological information. Fact Sheet for Patients:   StrictlyIdeas.no Fact Sheet for Healthcare Providers: BankingDealers.co.za This test is not yet approved or cleared  by the [place] FDA and has been authorized for detection and/or diagnosis of SARS-CoV-2 by FDA under an Emergency Use Authorization (EUA).  This EUA will remain in effect (meaning this test can be used) for the duration of the COVID-19 declaration under Section 564(b)(1) of the Act, 21 U.S.C. section 360bbb-3(b)(1), unless the authorization is terminated or revoked sooner. Performed at [facility], [place] [address], [place], [place] [address]   MRSA PCR Screening     Status: None   Collection Time: 02/01/20 11:50 PM  Result Value Ref Range Status   MRSA by PCR NEGATIVE NEGATIVE Final    Comment:        The GeneXpert MRSA Assay (FDA approved for NASAL specimens only), is one component of a comprehensive MRSA colonization surveillance program. It is not intended to diagnose  MRSA infection nor to guide or monitor treatment for MRSA infections. Performed at [facility], [place] [address], [place], [place] [address]      Labs: Basic Metabolic Panel: Recent Labs  Lab 02/02/20 0014 02/03/20 0355 02/04/20 0338 02/05/20 0629 02/06/20 0245  NA 140 139 140 141 139  K 3.8 4.4 4.2 4.5 4.6  CL 105 107 103 101 103  CO2 23 26 29 29 29   GLUCOSE 140* 125* 72 96 94  BUN 6 5* 8 6 9   CREATININE 1.02 1.18 1.13 1.19 1.25*  CALCIUM 9.2 8.7* 9.1 9.2 9.0  MG 2.3  --   --   --   --   PHOS 4.3  --   --   --   --    Liver Function Tests: Recent Labs  Lab 02/02/20 0014 02/03/20 0355 02/04/20 0338 02/05/20 0629 02/06/20 0245  AST 18 17 17 23 22   ALT 17 14 17 21 20   ALKPHOS 65 51 58 55 48  BILITOT 0.4 0.2* 0.3 0.5 0.2*  PROT 7.0 6.4* 6.6 7.0 6.3*  ALBUMIN 3.5 3.0* 3.3* 3.5 3.4*   No results for input(s): LIPASE, AMYLASE in the last 168 hours. No results for input(s): AMMONIA in the last 168 hours. CBC: Recent Labs  Lab 02/01/20 2018 02/01/20 2018 02/02/20 0014 02/03/20 0355 02/04/20 0338 02/05/20 0629 02/06/20 0245  WBC 9.4   < > 9.0 6.5 6.5 6.3 7.2  NEUTROABS 5.8  --  5.2  --   --   --   --   HGB 13.0   < > 13.2 11.9* 12.6* 13.3 12.3*  HCT 38.0*   < > 38.2* 35.9* 37.7* 39.0 36.9*  MCV 96.0   < > 96.2 98.6 99.2 98.2 97.9  PLT 317   < > 307 243 259 260 246   < > = values in this interval not displayed.   Cardiac Enzymes: No results for input(s): CKTOTAL, CKMB, CKMBINDEX, TROPONINI in the last 168 hours. BNP: BNP (last 3 results) No results for input(s): BNP in the last 8760 hours.  ProBNP (last 3 results) No results for input(s): PROBNP in the last 8760 hours.  CBG: Recent Labs  Lab 02/05/20 2003 02/05/20 2359 02/06/20 0353 02/06/20 0749 02/06/20 1139  GLUCAP 135*  124* 89 92 83    Active Problems:   Seizure-like activity (HCC)   GERD (gastroesophageal reflux disease)   Opioid dependence (HCC)   Tobacco abuse   Bipolar  disorder current episode depressed ([place])   Hypertension   Mandibular abscess   Diabetes mellitus without complication ([place])   Time coordinating discharge: 38 minutes.  Signed:        Kwamaine Cuppett, DO [facility]  02/06/2020, 3:56 PM

## 2020-03-06 ENCOUNTER — Ambulatory Visit: Payer: Medicaid Other | Admitting: Urology

## 2020-03-21 ENCOUNTER — Ambulatory Visit: Payer: Medicaid Other | Admitting: Urology

## 2020-03-28 ENCOUNTER — Ambulatory Visit
Admission: EM | Admit: 2020-03-28 | Discharge: 2020-03-28 | Discharge status: Absent without leave | Payer: Medicaid Other

## 2020-04-16 ENCOUNTER — Ambulatory Visit: Payer: Medicaid Other | Admitting: Orthopedic Surgery

## 2020-04-16 ENCOUNTER — Other Ambulatory Visit: Payer: Self-pay

## 2020-04-16 VITALS — BP 133/74 | HR 78 | Ht 74.0 in | Wt 250.0 lb

## 2020-04-16 DIAGNOSIS — M25572 Pain in left ankle and joints of left foot: Secondary | ICD-10-CM | POA: Diagnosis not present

## 2020-04-16 DIAGNOSIS — M25561 Pain in right knee: Secondary | ICD-10-CM | POA: Diagnosis not present

## 2020-04-16 DIAGNOSIS — G8929 Other chronic pain: Secondary | ICD-10-CM

## 2020-04-16 MED ORDER — MELOXICAM 7.5 MG PO TABS: 7.5000 mg | ORAL_TABLET | Freq: Every day | ORAL | 5 refills | Status: DC

## 2020-04-16 NOTE — Progress Notes (Signed)
Jermaine [name]  04/16/2020  Body mass index is 32.1 kg/m.   S:  Chief Complaint  Patient presents with   Ankle Pain    Left ankle,    Jermaine [name] came in today for follow-up regarding his left ankle we recommended he get an MRI to see if he had a peroneal tendon tear he could not get the MRI because of an insurance issue but he was told to come back for reevaluation and then they would approve  He has posttraumatic pain lateral side of his left ankle associated swelling and weakness of his peroneal tendons  He also continues to complain of pain in his right knee status post arthroscopy for bucket-handle meniscal tear where the main bucket fragment had to be removed he also had a unstable root tear  He got an injection in February he said it did not really help  He is on oxycodone for chronic pain he has been referred to pain management    O: BP (!) 133/74    Pulse 78    Ht 6\' 2"  (1.88 m)    Wt (!) 250 lb (113.4 kg)    BMI 32.10 kg/m   Physical Exam  Large mass and swelling over the left ankle anterior to the fibula with tenderness in the peroneal tendon sheath weakness of the peroneal tendons in terms of eversion normal ankle motion and no instability  His right knee is tender over the lateral joint line no effusion he has full range of motion knee feels stable   [place]  Encounter Diagnoses  Name Primary?   Chronic pain of right knee Yes   Pain in left ankle and joints of left foot    MANAGEMENT   MRI LEFT ANKLE   INJECT RIGHT KNEE  Procedure note right knee injection   verbal consent was obtained to inject right knee joint  Timeout was completed to confirm the site of injection  The medications used were 40 mg of Depo-Medrol and 1% lidocaine 3 cc  Anesthesia was provided by ethyl chloride and the skin was prepped with alcohol.  After cleaning the skin with alcohol a 20-gauge needle was used to inject the right knee joint. There were no  complications. A sterile bandage was applied.  F/U BY PHONE   Meds ordered this encounter  Medications   meloxicam (MOBIC) 7.5 MG tablet    Sig: Take 1 tablet (7.5 mg total) by mouth daily.    Dispense:  30 tablet    Refill:  Lambert, MD  04/16/2020 2:56 PM

## 2020-04-17 ENCOUNTER — Telehealth: Payer: Self-pay | Admitting: Orthopedic Surgery

## 2020-04-17 NOTE — Telephone Encounter (Signed)
Called patient to offer follow up appointment for review of MRI which has been scheduled for 05/07/20. Phone connection was poor - patient was asking for an afternoon appointment, and in process of scheduling, connection lost. I called back to patient; no voice mail is set up; unable to leave a message. Mailed AVS from yesterday's visit with Dr Aline Brochure 04/16/20 as patient had not come to check out desk when his visit was completed; included card to call back to schedule appointment.

## 2020-04-18 NOTE — Telephone Encounter (Signed)
Done - letter / AVS mailed regarding scheduling.

## 2020-05-07 ENCOUNTER — Ambulatory Visit (HOSPITAL_COMMUNITY): Payer: Medicaid Other

## 2020-05-22 ENCOUNTER — Ambulatory Visit (HOSPITAL_COMMUNITY)
Admission: RE | Admit: 2020-05-22 | Discharge: 2020-05-22 | Disposition: A | Discharge status: Home / Self Care | Payer: Medicaid Other | Source: Ambulatory Visit | Attending: Orthopedic Surgery | Admitting: Orthopedic Surgery

## 2020-05-22 ENCOUNTER — Other Ambulatory Visit: Payer: Self-pay

## 2020-05-22 DIAGNOSIS — M25572 Pain in left ankle and joints of left foot: Secondary | ICD-10-CM | POA: Diagnosis present

## 2020-06-03 ENCOUNTER — Other Ambulatory Visit: Payer: Self-pay

## 2020-06-03 ENCOUNTER — Emergency Department (HOSPITAL_COMMUNITY): Payer: Medicaid Other

## 2020-06-03 ENCOUNTER — Emergency Department (HOSPITAL_COMMUNITY)
Admission: EM | Admit: 2020-06-03 | Discharge: 2020-06-03 | Disposition: A | Discharge status: Home / Self Care | Payer: Medicaid Other | Attending: Emergency Medicine | Admitting: Emergency Medicine

## 2020-06-03 ENCOUNTER — Encounter (HOSPITAL_COMMUNITY): Payer: Self-pay

## 2020-06-03 DIAGNOSIS — I10 Essential (primary) hypertension: Secondary | ICD-10-CM | POA: Diagnosis not present

## 2020-06-03 DIAGNOSIS — R0789 Other chest pain: Secondary | ICD-10-CM | POA: Insufficient documentation

## 2020-06-03 DIAGNOSIS — I2510 Atherosclerotic heart disease of native coronary artery without angina pectoris: Secondary | ICD-10-CM | POA: Insufficient documentation

## 2020-06-03 DIAGNOSIS — E119 Type 2 diabetes mellitus without complications: Secondary | ICD-10-CM | POA: Insufficient documentation

## 2020-06-03 DIAGNOSIS — F17210 Nicotine dependence, cigarettes, uncomplicated: Secondary | ICD-10-CM | POA: Diagnosis not present

## 2020-06-03 DIAGNOSIS — Z79899 Other long term (current) drug therapy: Secondary | ICD-10-CM | POA: Insufficient documentation

## 2020-06-03 DIAGNOSIS — X58XXXA Exposure to other specified factors, initial encounter: Secondary | ICD-10-CM | POA: Insufficient documentation

## 2020-06-03 DIAGNOSIS — S27819A Unspecified injury of esophagus (thoracic part), initial encounter: Secondary | ICD-10-CM | POA: Diagnosis not present

## 2020-06-03 LAB — CBC
HCT: 41.9 % (ref 39.0–52.0)
Hemoglobin: 14.7 g/dL (ref 13.0–17.0)
MCH: 33.3 pg (ref 26.0–34.0)
MCHC: 35.1 g/dL (ref 30.0–36.0)
MCV: 95 fL (ref 80.0–100.0)
Platelets: 209 10*3/uL (ref 150–400)
RBC: 4.41 MIL/uL (ref 4.22–5.81)
RDW: 12.9 % (ref 11.5–15.5)
WBC: 10.8 10*3/uL — ABNORMAL HIGH (ref 4.0–10.5)
nRBC: 0 % (ref 0.0–0.2)

## 2020-06-03 LAB — BASIC METABOLIC PANEL
Anion gap: 13 (ref 5–15)
BUN: 11 mg/dL (ref 6–20)
CO2: 25 mmol/L (ref 22–32)
Calcium: 9.6 mg/dL (ref 8.9–10.3)
Chloride: 100 mmol/L (ref 98–111)
Creatinine, Ser: 1.1 mg/dL (ref 0.61–1.24)
GFR calc Af Amer: >60 mL/min (ref >60)
GFR calc non Af Amer: >60 mL/min (ref >60)
Glucose, Bld: 105 mg/dL — ABNORMAL HIGH (ref 70–99)
Potassium: 3.5 mmol/L (ref 3.5–5.1)
Sodium: 138 mmol/L (ref 135–145)

## 2020-06-03 LAB — TROPONIN I (HIGH SENSITIVITY)
Troponin I (High Sensitivity): 3 ng/L (ref <18)
Troponin I (High Sensitivity): 3 ng/L (ref <18)

## 2020-06-03 MED ORDER — ALUM & MAG HYDROXIDE-SIMETH 200-200-20 MG/5ML PO SUSP
30.0000 mL | Freq: Once | ORAL | Status: AC
  Administered 2020-06-03: 30 mL via ORAL
  Filled 2020-06-03: qty 30

## 2020-06-03 MED ORDER — LIDOCAINE VISCOUS HCL 2 % MT SOLN
15.0000 mL | Freq: Once | OROMUCOSAL | Status: AC
  Administered 2020-06-03: 15 mL via ORAL
  Filled 2020-06-03: qty 15

## 2020-06-03 MED ORDER — SUCRALFATE 1 GM/10ML PO SUSP: 1.0000 g | Freq: Three times a day (TID) | ORAL | 0 refills | Status: DC

## 2020-06-03 NOTE — ED Triage Notes (Signed)
Pt presents to ED states when he breathes or swallows he has a sharp pain in his mid chest. Pt states it started after he had his seizure the other night and vomited.
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
## 2020-06-15 IMAGING — DX DG LUMBAR SPINE COMPLETE 4+V
5 series · 5 of 5 positions shown · non-contrast
Comparison: None.

CLINICAL DATA: Chronic lumbago

EXAM:
LUMBAR SPINE - COMPLETE 4+ VIEW

[l-spine ap]
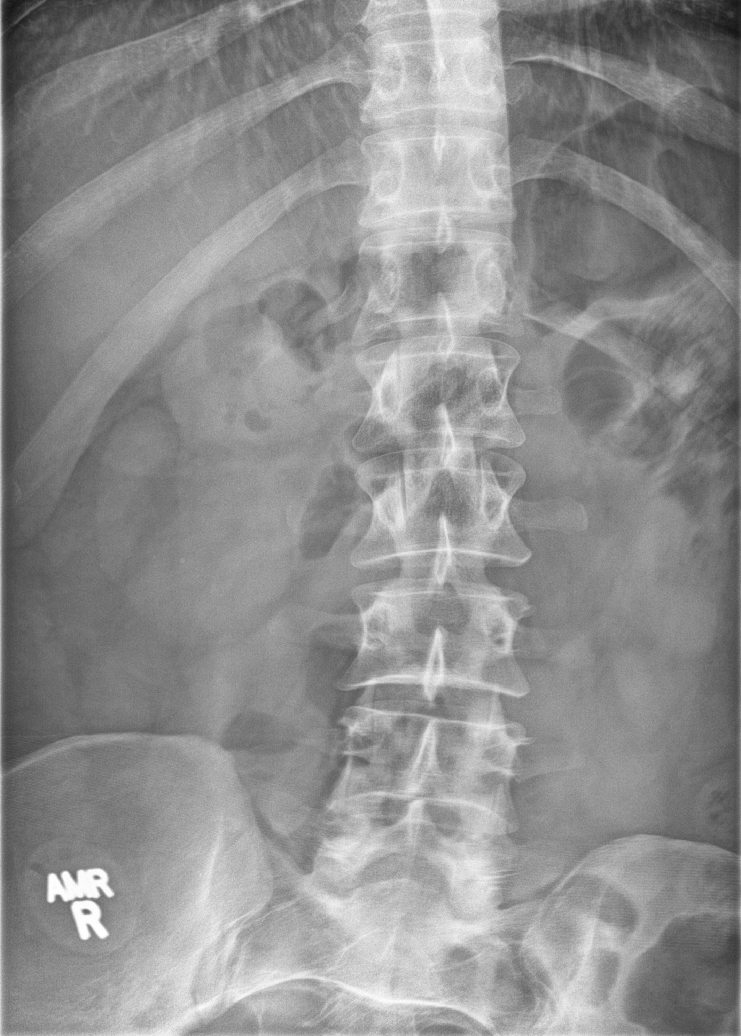

[l-spine obl (1 of 2)]
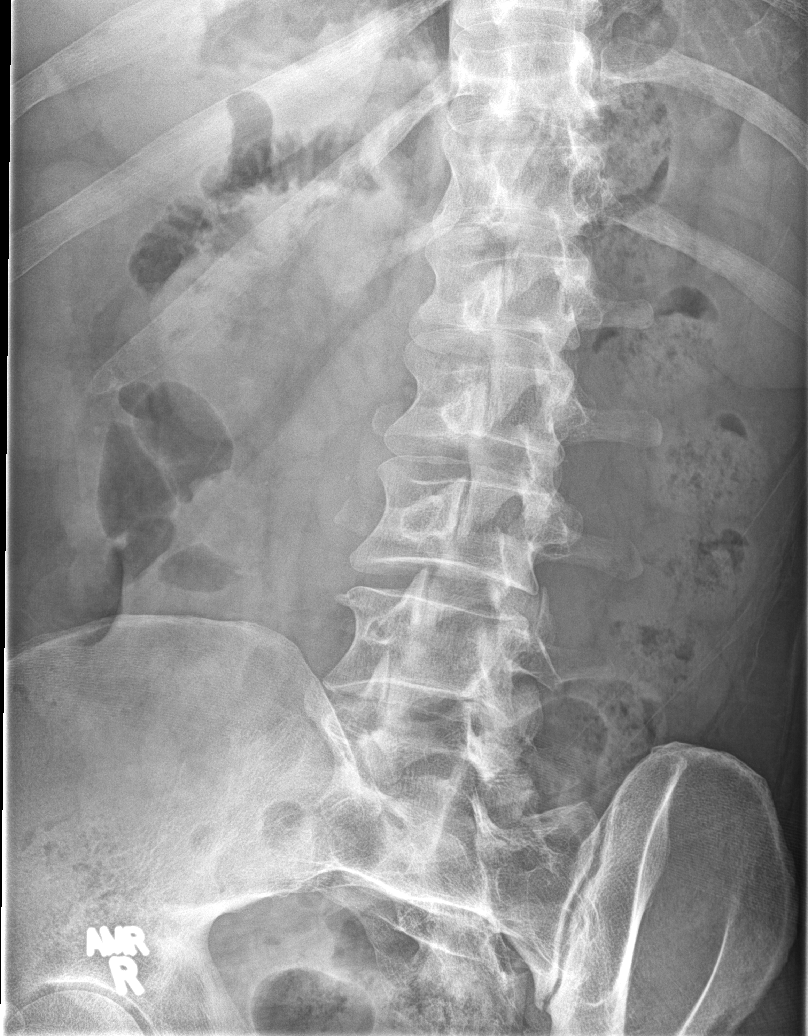

[l-spine obl (2 of 2)]
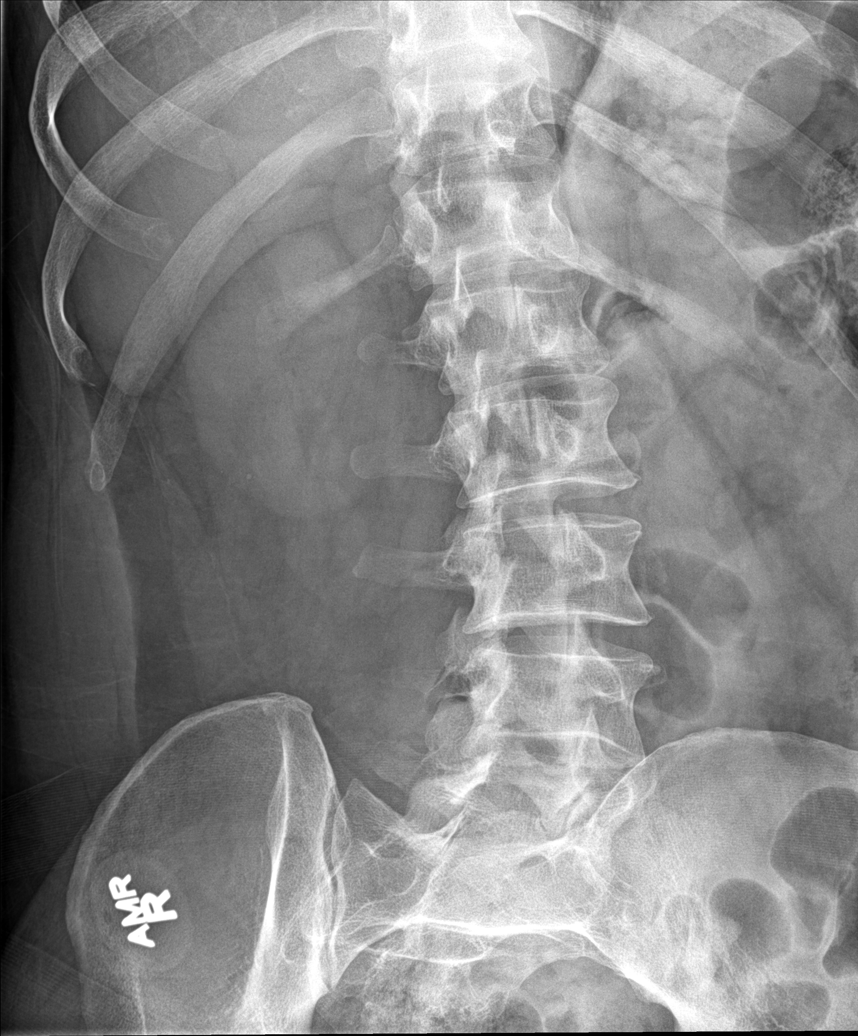

[l-spine lat]
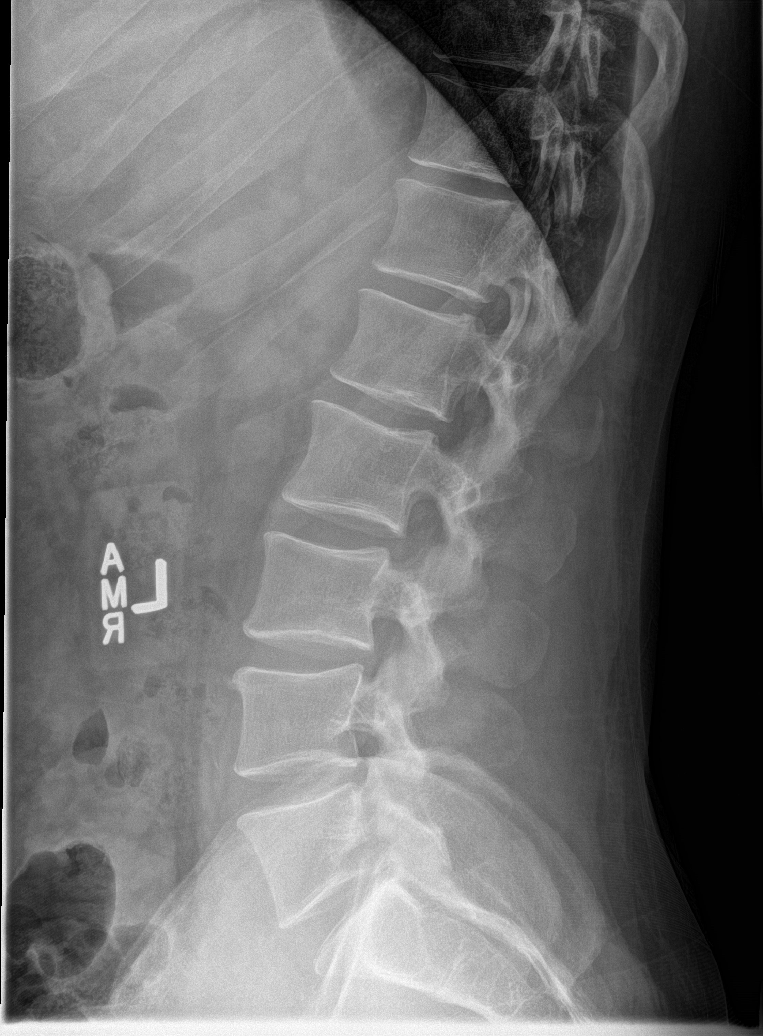

[l-spine spot]
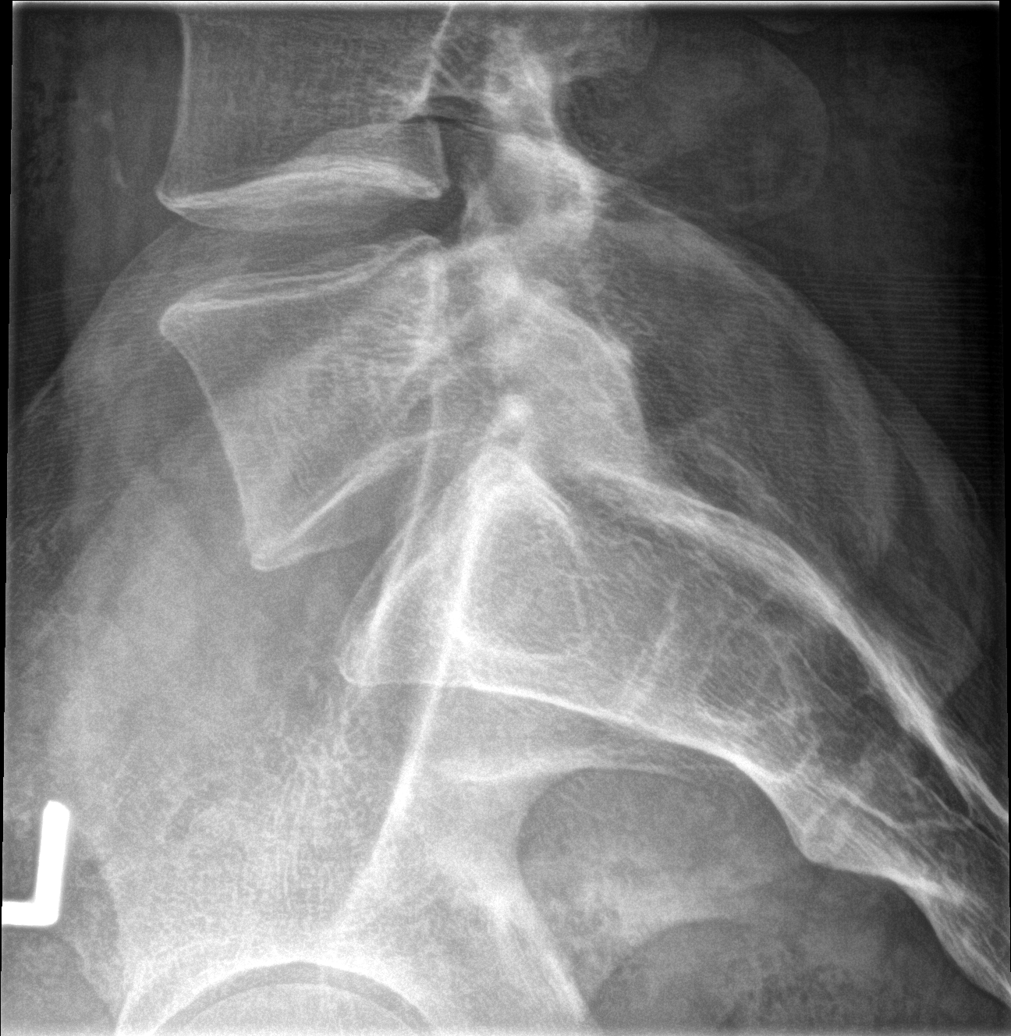

[5 of 5 positions shown; findings below may reference images not displayed]

FINDINGS: Frontal, lateral, spot lumbosacral lateral, and bilateral oblique
views were obtained. There are 5 non-rib-bearing lumbar type
vertebral bodies. There is lumbar levoscoliosis. There is no
fracture or spondylolisthesis. The disc spaces appear normal. There
is no appreciable facet arthropathy.
IMPRESSION: Scoliosis. No fracture or spondylolisthesis. No appreciable
arthropathy.

## 2020-07-01 ENCOUNTER — Ambulatory Visit (INDEPENDENT_AMBULATORY_CARE_PROVIDER_SITE_OTHER): Payer: Medicaid Other | Admitting: Orthopedic Surgery

## 2020-07-01 ENCOUNTER — Encounter: Payer: Self-pay | Admitting: Orthopedic Surgery

## 2020-07-01 ENCOUNTER — Other Ambulatory Visit: Payer: Self-pay

## 2020-07-01 VITALS — BP 135/89 | HR 90 | Ht 74.0 in | Wt 202.0 lb

## 2020-07-01 DIAGNOSIS — M659 Synovitis and tenosynovitis, unspecified: Secondary | ICD-10-CM

## 2020-07-01 DIAGNOSIS — S8262XK Displaced fracture of lateral malleolus of left fibula, subsequent encounter for closed fracture with nonunion: Secondary | ICD-10-CM | POA: Diagnosis not present

## 2020-07-01 DIAGNOSIS — M25572 Pain in left ankle and joints of left foot: Secondary | ICD-10-CM
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
## 2020-07-01 NOTE — Patient Instructions (Addendum)
Referral to Dr Sharol Given for possible Ankle arthroscopy loose body removal avulsion non union and tenosynovitis   Their office will call you with appointment [phone] is the number.

## 2020-07-04 ENCOUNTER — Encounter: Payer: Self-pay | Admitting: Orthopedic Surgery

## 2020-07-04 ENCOUNTER — Ambulatory Visit (INDEPENDENT_AMBULATORY_CARE_PROVIDER_SITE_OTHER): Payer: Medicaid Other | Admitting: Orthopedic Surgery

## 2020-07-04 VITALS — Ht 74.0 in | Wt 202.0 lb

## 2020-07-04 DIAGNOSIS — G8929 Other chronic pain: Secondary | ICD-10-CM

## 2020-07-04 DIAGNOSIS — M25572 Pain in left ankle and joints of left foot: Secondary | ICD-10-CM

## 2020-07-09 ENCOUNTER — Encounter: Payer: Self-pay | Admitting: Orthopedic Surgery
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
## 2020-07-16 ENCOUNTER — Encounter (HOSPITAL_COMMUNITY): Payer: Self-pay

## 2020-07-16 ENCOUNTER — Emergency Department (HOSPITAL_COMMUNITY)
Admission: EM | Admit: 2020-07-16 | Discharge: 2020-07-16 | Payer: Medicaid Other | Attending: Emergency Medicine | Admitting: Emergency Medicine

## 2020-07-16 ENCOUNTER — Emergency Department (HOSPITAL_COMMUNITY): Payer: Medicaid Other

## 2020-07-16 ENCOUNTER — Encounter (HOSPITAL_COMMUNITY): Payer: Self-pay | Admitting: Vascular Surgery

## 2020-07-16 ENCOUNTER — Other Ambulatory Visit: Payer: Self-pay

## 2020-07-16 DIAGNOSIS — I10 Essential (primary) hypertension: Secondary | ICD-10-CM | POA: Diagnosis not present

## 2020-07-16 DIAGNOSIS — Y92149 Unspecified place in prison as the place of occurrence of the external cause: Secondary | ICD-10-CM | POA: Diagnosis not present

## 2020-07-16 DIAGNOSIS — E119 Type 2 diabetes mellitus without complications: Secondary | ICD-10-CM | POA: Insufficient documentation

## 2020-07-16 DIAGNOSIS — T148XXA Other injury of unspecified body region, initial encounter: Secondary | ICD-10-CM

## 2020-07-16 DIAGNOSIS — R519 Headache, unspecified: Secondary | ICD-10-CM | POA: Diagnosis not present

## 2020-07-16 DIAGNOSIS — M542 Cervicalgia: Secondary | ICD-10-CM | POA: Insufficient documentation

## 2020-07-16 DIAGNOSIS — R569 Unspecified convulsions: Secondary | ICD-10-CM | POA: Insufficient documentation

## 2020-07-16 DIAGNOSIS — W06XXXA Fall from bed, initial encounter: Secondary | ICD-10-CM | POA: Diagnosis not present

## 2020-07-16 DIAGNOSIS — Y9384 Activity, sleeping: Secondary | ICD-10-CM | POA: Insufficient documentation

## 2020-07-16 DIAGNOSIS — I2510 Atherosclerotic heart disease of native coronary artery without angina pectoris: Secondary | ICD-10-CM | POA: Diagnosis not present

## 2020-07-16 DIAGNOSIS — W19XXXA Unspecified fall, initial encounter: Secondary | ICD-10-CM

## 2020-07-16 DIAGNOSIS — M546 Pain in thoracic spine: Secondary | ICD-10-CM | POA: Insufficient documentation

## 2020-07-16 DIAGNOSIS — F17210 Nicotine dependence, cigarettes, uncomplicated: Secondary | ICD-10-CM | POA: Insufficient documentation

## 2020-07-16 DIAGNOSIS — W228XXA Striking against or struck by other objects, initial encounter: Secondary | ICD-10-CM | POA: Insufficient documentation

## 2020-07-16 DIAGNOSIS — M545 Low back pain, unspecified: Secondary | ICD-10-CM | POA: Insufficient documentation

## 2020-07-16 LAB — CBC
HCT: 46.8 % (ref 39.0–52.0)
Hemoglobin: 16.4 g/dL (ref 13.0–17.0)
MCH: 33.1 pg (ref 26.0–34.0)
MCHC: 35 g/dL (ref 30.0–36.0)
MCV: 94.5 fL (ref 80.0–100.0)
Platelets: 207 10*3/uL (ref 150–400)
RBC: 4.95 MIL/uL (ref 4.22–5.81)
RDW: 12.2 % (ref 11.5–15.5)
WBC: 10.6 10*3/uL — ABNORMAL HIGH (ref 4.0–10.5)
nRBC: 0 % (ref 0.0–0.2)

## 2020-07-16 LAB — COMPREHENSIVE METABOLIC PANEL
ALT: 19 U/L (ref 0–44)
AST: 20 U/L (ref 15–41)
Albumin: 4.3 g/dL (ref 3.5–5.0)
Alkaline Phosphatase: 60 U/L (ref 38–126)
Anion gap: 13 (ref 5–15)
BUN: 12 mg/dL (ref 6–20)
CO2: 22 mmol/L (ref 22–32)
Calcium: 9.9 mg/dL (ref 8.9–10.3)
Chloride: 103 mmol/L (ref 98–111)
Creatinine, Ser: 0.87 mg/dL (ref 0.61–1.24)
GFR, Estimated: >60 mL/min (ref >60)
Glucose, Bld: 106 mg/dL — ABNORMAL HIGH (ref 70–99)
Potassium: 4.1 mmol/L (ref 3.5–5.1)
Sodium: 138 mmol/L (ref 135–145)
Total Bilirubin: 0.7 mg/dL (ref 0.3–1.2)
Total Protein: 7.5 g/dL (ref 6.5–8.1)

## 2020-07-16 LAB — LACTIC ACID, PLASMA: Lactic Acid, Venous: 2.8 mmol/L (ref 0.5–1.9)

## 2020-07-16 MED ORDER — LINACLOTIDE 145 MCG PO CAPS
290.0000 ug | ORAL_CAPSULE | Freq: Once | ORAL | Status: AC
  Administered 2020-07-16: 290 ug via ORAL
  Filled 2020-07-16: qty 1

## 2020-07-16 MED ORDER — SODIUM CHLORIDE 0.9 % IV BOLUS
1000.0000 mL | Freq: Once | INTRAVENOUS | Status: AC
  Administered 2020-07-16: 1000 mL via INTRAVENOUS

## 2020-07-16 MED ORDER — LINACLOTIDE 290 MCG PO CAPS: 290.0000 ug | ORAL_CAPSULE | Freq: Every day | ORAL | 0 refills | Status: DC

## 2020-07-16 MED ORDER — KETOROLAC TROMETHAMINE 15 MG/ML IJ SOLN
15.0000 mg | Freq: Once | INTRAMUSCULAR | Status: AC
  Administered 2020-07-16: 15 mg via INTRAVENOUS
  Filled 2020-07-16: qty 1

## 2020-07-16 MED ORDER — FENTANYL CITRATE (PF) 100 MCG/2ML IJ SOLN
50.0000 ug | Freq: Once | INTRAMUSCULAR | Status: AC
  Administered 2020-07-16: 50 ug via INTRAVENOUS
  Filled 2020-07-16: qty 2

## 2020-07-16 MED ORDER — LAMOTRIGINE 200 MG PO TABS
200.0000 mg | ORAL_TABLET | Freq: Every day | ORAL | 0 refills | Status: DC
Start: 2020-07-16 — End: 2020-12-14

## 2020-07-16 NOTE — ED Notes (Signed)
EDP at bedside  

## 2020-07-16 NOTE — ED Notes (Signed)
Pt transported to radiology.

## 2020-07-16 NOTE — ED Notes (Signed)
Date and time results received: 07/16/20 1709 (use smartphrase ".now" to insert current time)  Test: Lactic Acid Critical Value: 1709  Name of Provider Notified: Dr Sedonia Small   Orders Received? Or Actions Taken?:NA

## 2020-07-16 NOTE — ED Notes (Signed)
Pt states he last took his meds yesterday am and has not had any medications since going to jail.
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
## 2020-07-16 NOTE — ED Triage Notes (Signed)
Pt [facility] ED from jail after falling from top bunk while asleep. Pt c/o lower back pain, middle back pain, head pain and left ankle pain. Pt denies LOC

## 2020-07-16 NOTE — Progress Notes (Addendum)
[id number] - [place], [address] [place] [address] Phone: [phone] Fax: [phone]      Your procedure is scheduled on Friday, October 29th.  Report to [facility] Main Entrance "A" at 8:30 A.M., and check in at the Admitting office.  Call this number if you have problems the morning of surgery:  [phone]  Call [phone] if you have any questions prior to your surgery date Monday-Friday 8am-4pm    Remember:  Do not eat or drink after midnight the night before your surgery    Take these medicines the morning of surgery with A SIP OF WATER   Amlodipine (Norvasc)  Atenolol (Tenormin)  Buspirone (Buspar)  Clonidine (Catapres)  Desvenlafaxine (Pristiq)  Cyclobenzaprine (Flexeril)  Fenofibrate (Tricor)  Gabapentin (Neurontin)  Lamotrigine (Lamictal)  Levothyroxine (Synthroid)  Linaclotide (Linzess)  Lithium Carbonate  Omeprazole (Prilosec)  Oxcarbazepine (Trileptal)  Oxycodone - if needed    As of today, STOP taking any Aspirin (unless otherwise instructed by your surgeon) Aleve, Naproxen, Ibuprofen, Motrin, Advil, Goody's, BC's, all herbal medications, fish oil, and all vitamins.   WHAT DO I DO ABOUT MY DIABETES MEDICATION?   Marland Kitchen Do not take oral diabetes medicines (pills) the morning of surgery.   HOW TO MANAGE YOUR DIABETES BEFORE AND AFTER SURGERY  Why is it important to control my blood sugar before and after surgery? . Improving blood sugar levels before and after surgery helps healing and can limit problems. . A way of improving blood sugar control is eating a healthy diet by: o  Eating less sugar and carbohydrates o  Increasing activity/exercise o  Talking with your doctor about reaching your blood sugar goals . High blood sugars (greater than 180 mg/dL) can raise your risk of infections and slow your recovery, so you will need to focus on controlling your  diabetes during the weeks before surgery. . Make sure that the doctor who takes care of your diabetes knows about your planned surgery including the date and location.  How do I manage my blood sugar before surgery? . Check your blood sugar at least 4 times a day, starting 2 days before surgery, to make sure that the level is not too high or low. . Check your blood sugar the morning of your surgery when you wake up and every 2 hours until you get to the Short Stay unit. o If your blood sugar is less than 70 mg/dL, you will need to treat for low blood sugar: - Do not take insulin. - Treat a low blood sugar (less than 70 mg/dL) with  cup of clear juice (cranberry or apple), 4 glucose tablets, OR glucose gel. - Recheck blood sugar in 15 minutes after treatment (to make sure it is greater than 70 mg/dL). If your blood sugar is not greater than 70 mg/dL on recheck, call [phone] for further instructions. . Report your blood sugar to the short stay nurse when you get to Short Stay.  . If you are admitted to the hospital after surgery: o Your blood sugar will be checked by the staff and you will probably be given insulin after surgery (instead of oral diabetes medicines) to make sure you have good blood sugar levels. o The goal for blood sugar control after surgery is 80-180 mg/dL.                DAY OF SURGERY:  Do not wear jewelry            Do not wear lotions, powders, colognes, or deodorant.            Men may shave face and neck.            Do not bring valuables to the hospital.            [facility] is not responsible for any belongings or valuables.  Do NOT Smoke (Tobacco/Vaping) or drink Alcohol 24 hours prior to your procedure If you use a CPAP at night, you may bring all equipment for your overnight stay.   Contacts, glasses, dentures or bridgework may not be worn into surgery.      For patients admitted to the hospital, discharge time will be determined by your  treatment team.   Patients discharged the day of surgery will not be allowed to drive home, and someone needs to stay with them for 24 hours.    Special instructions:   [place]- Preparing For Surgery  Before surgery, you can play an important role. Because skin is not sterile, your skin needs to be as free of germs as possible. You can reduce the number of germs on your skin by washing with CHG (chlorahexidine gluconate) Soap before surgery.  CHG is an antiseptic cleaner which kills germs and bonds with the skin to continue killing germs even after washing.    Oral Hygiene is also important to reduce your risk of infection.  Remember - BRUSH YOUR TEETH THE MORNING OF SURGERY WITH YOUR REGULAR TOOTHPASTE  Please do not use if you have an allergy to CHG or antibacterial soaps. If your skin becomes reddened/irritated stop using the CHG.  Do not shave (including legs and underarms) for at least 48 hours prior to first CHG shower. It is OK to shave your face.  Please follow these instructions carefully.   1. Shower the NIGHT BEFORE SURGERY and the MORNING OF SURGERY with CHG Soap.   2. If you chose to wash your hair, wash your hair first as usual with your normal shampoo.  3. After you shampoo, rinse your hair and body thoroughly to remove the shampoo.  4. Use CHG as you would any other liquid soap. You can apply CHG directly to the skin and wash gently with a scrungie or a clean washcloth.   5. Apply the CHG Soap to your body ONLY FROM THE NECK DOWN.  Do not use on open wounds or open sores. Avoid contact with your eyes, ears, mouth and genitals (private parts). Wash Face and genitals (private parts)  with your normal soap.   6. Wash thoroughly, paying special attention to the area where your surgery will be performed.  7. Thoroughly rinse your body with warm water from the neck down.  8. DO NOT shower/wash with your normal soap after using and rinsing off the CHG Soap.  9. Pat  yourself dry with a CLEAN TOWEL.  10. Wear CLEAN PAJAMAS to bed the night before surgery  11. Place CLEAN SHEETS on your bed the night of your first shower and DO NOT SLEEP WITH PETS.   Day of Surgery: Wear Clean/Comfortable clothing the morning of surgery Do not apply any deodorants/lotions.   Remember to brush your teeth WITH YOUR REGULAR TOOTHPASTE.   Please read over the following fact sheets that you were given.

## 2020-07-16 NOTE — Discharge Instructions (Addendum)
You were evaluated in the Emergency Department and after careful evaluation, we did not find any emergent condition requiring admission or further testing in the hospital.  Your exam/testing today was overall reassuring.  Symptoms seem to be due to bruising from the fall.  It is important that you restart all of your daily medications as soon as possible.  We have prescribed you your antiseizure medications, to hopefully prevent seizures until you are able to obtain your home medications.  Please return to the Emergency Department if you experience any worsening of your condition.  Thank you for allowing [place] to be a part of your care.

## 2020-07-17 ENCOUNTER — Inpatient Hospital Stay (HOSPITAL_COMMUNITY): Admission: RE | Admit: 2020-07-17 | Payer: Medicaid Other | Source: Ambulatory Visit

## 2020-07-17 ENCOUNTER — Inpatient Hospital Stay (HOSPITAL_COMMUNITY)
Admission: RE | Admit: 2020-07-17 | Discharge: 2020-07-17 | Disposition: A | Discharge status: Home / Self Care | Payer: Medicaid Other | Source: Ambulatory Visit

## 2020-07-19 ENCOUNTER — Ambulatory Visit (HOSPITAL_COMMUNITY): Admission: RE | Admit: 2020-07-19 | Payer: Medicaid Other | Source: Home / Self Care | Admitting: Oral Surgery

## 2020-07-19 ENCOUNTER — Encounter (HOSPITAL_COMMUNITY): Admission: RE | Payer: Self-pay | Source: Home / Self Care

## 2020-07-19 SURGERY — DENTAL RESTORATION/EXTRACTIONS
Anesthesia: General | Laterality: Bilateral

## 2020-07-25 ENCOUNTER — Ambulatory Visit (INDEPENDENT_AMBULATORY_CARE_PROVIDER_SITE_OTHER): Payer: Medicaid Other | Admitting: Physician Assistant

## 2020-07-25 ENCOUNTER — Encounter: Payer: Self-pay | Admitting: Orthopedic Surgery

## 2020-07-25 VITALS — Ht 74.0 in | Wt 235.0 lb

## 2020-07-25 DIAGNOSIS — G8929 Other chronic pain: Secondary | ICD-10-CM | POA: Diagnosis not present

## 2020-07-25 DIAGNOSIS — M25572 Pain in left ankle and joints of left foot: Secondary | ICD-10-CM
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
## 2020-08-03 ENCOUNTER — Other Ambulatory Visit (HOSPITAL_COMMUNITY)
Admission: RE | Admit: 2020-08-03 | Discharge: 2020-08-03 | Disposition: A | Discharge status: Home / Self Care | Payer: Medicaid Other | Source: Ambulatory Visit | Attending: Oral Surgery | Admitting: Oral Surgery

## 2020-08-03 DIAGNOSIS — Z20822 Contact with and (suspected) exposure to covid-19: Secondary | ICD-10-CM | POA: Diagnosis not present

## 2020-08-03 DIAGNOSIS — Z01812 Encounter for preprocedural laboratory examination: Secondary | ICD-10-CM | POA: Insufficient documentation

## 2020-08-03 LAB — SARS CORONAVIRUS 2 (TAT 6-24 HRS): SARS Coronavirus 2: NEGATIVE

## 2020-08-05 ENCOUNTER — Other Ambulatory Visit: Payer: Self-pay

## 2020-08-05 ENCOUNTER — Encounter (HOSPITAL_COMMUNITY): Payer: Self-pay | Admitting: Oral Surgery
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
## 2020-08-05 NOTE — Progress Notes (Signed)
Mr. Mehlhoff denies chest pain or shortness of breath. Patient tested negative for Covid 08/03/20 and has been in quarantine since that time.   Mr. Humbarger has type II diabetes. Patient states that fasting CBGs run 60-70. Mr. Hume states that he is riding around by himself, just to get out of the house.Mr. Claxton does not know the Dose of Metformin, I called the pharmacies listed and none have filled a prescription.I instructed Mr. Froning to not take Metformin in am. I instructed patient to check CBG after awaking and every 2 hours until arrival  to the hospital.  I Instructed patient if CBG is less than 70 to 1 tube of Glucose Gel. Recheck CBG in 15 minutes then call pre- op desk at [phone] for further instructions.

## 2020-08-06 ENCOUNTER — Ambulatory Visit (HOSPITAL_COMMUNITY)
Admission: RE | Admit: 2020-08-06 | Discharge: 2020-08-06 | Disposition: A | Discharge status: Home / Self Care | Payer: Medicaid Other | Attending: Oral Surgery | Admitting: Oral Surgery

## 2020-08-06 ENCOUNTER — Ambulatory Visit (HOSPITAL_COMMUNITY): Payer: Medicaid Other | Admitting: Physician Assistant

## 2020-08-06 ENCOUNTER — Encounter (HOSPITAL_COMMUNITY)
Admission: RE | Disposition: A | Discharge status: Home / Self Care | Payer: Self-pay | Source: Home / Self Care | Attending: Oral Surgery

## 2020-08-06 ENCOUNTER — Other Ambulatory Visit: Payer: Self-pay

## 2020-08-06 DIAGNOSIS — K011 Impacted teeth: Secondary | ICD-10-CM | POA: Diagnosis not present

## 2020-08-06 DIAGNOSIS — K083 Retained dental root: Secondary | ICD-10-CM | POA: Insufficient documentation

## 2020-08-06 DIAGNOSIS — Z885 Allergy status to narcotic agent status: Secondary | ICD-10-CM | POA: Diagnosis not present

## 2020-08-06 DIAGNOSIS — K029 Dental caries, unspecified: Secondary | ICD-10-CM | POA: Diagnosis present

## 2020-08-06 HISTORY — DX: Acute myocardial infarction, unspecified: I21.9

## 2020-08-06 HISTORY — PX: TOOTH EXTRACTION: SHX859

## 2020-08-06 HISTORY — DX: Hyperlipidemia, unspecified: E78.5

## 2020-08-06 LAB — POCT I-STAT, CHEM 8
BUN: 9 mg/dL (ref 6–20)
Calcium, Ion: 1.18 mmol/L (ref 1.15–1.40)
Chloride: 105 mmol/L (ref 98–111)
Creatinine, Ser: 0.8 mg/dL (ref 0.61–1.24)
Glucose, Bld: 88 mg/dL (ref 70–99)
HCT: 45 % (ref 39.0–52.0)
Hemoglobin: 15.3 g/dL (ref 13.0–17.0)
Potassium: 4.3 mmol/L (ref 3.5–5.1)
Sodium: 139 mmol/L (ref 135–145)
TCO2: 22 mmol/L (ref 22–32)

## 2020-08-06 LAB — GLUCOSE, CAPILLARY: Glucose-Capillary: 116 mg/dL — ABNORMAL HIGH (ref 70–99)

## 2020-08-06 SURGERY — DENTAL RESTORATION/EXTRACTIONS
Anesthesia: General | Site: Mouth

## 2020-08-06 MED ORDER — PROPOFOL 10 MG/ML IV BOLUS
INTRAVENOUS | Status: DC | PRN
  Administered 2020-08-06: 200 mg via INTRAVENOUS

## 2020-08-06 MED ORDER — CEFAZOLIN SODIUM-DEXTROSE 2-4 GM/100ML-% IV SOLN
INTRAVENOUS | Status: AC
  Filled 2020-08-06: qty 100

## 2020-08-06 MED ORDER — ACETAMINOPHEN 500 MG PO TABS
1000.0000 mg | ORAL_TABLET | Freq: Once | ORAL | Status: DC
Start: 2020-08-06 — End: 2020-08-06

## 2020-08-06 MED ORDER — ROCURONIUM BROMIDE 10 MG/ML (PF) SYRINGE
PREFILLED_SYRINGE | INTRAVENOUS | Status: AC
  Filled 2020-08-06: qty 10

## 2020-08-06 MED ORDER — DEXAMETHASONE SODIUM PHOSPHATE 10 MG/ML IJ SOLN
INTRAMUSCULAR | Status: AC
  Filled 2020-08-06: qty 1

## 2020-08-06 MED ORDER — CELECOXIB 200 MG PO CAPS: 200.0000 mg | ORAL_CAPSULE | Freq: Once | ORAL | Status: DC

## 2020-08-06 MED ORDER — FENTANYL CITRATE (PF) 250 MCG/5ML IJ SOLN
INTRAMUSCULAR | Status: DC | PRN
  Administered 2020-08-06: 100 ug via INTRAVENOUS
  Administered 2020-08-06: 50 ug via INTRAVENOUS

## 2020-08-06 MED ORDER — ONDANSETRON HCL 4 MG/2ML IJ SOLN
INTRAMUSCULAR | Status: AC
  Filled 2020-08-06: qty 2

## 2020-08-06 MED ORDER — LACTATED RINGERS IV SOLN: INTRAVENOUS | Status: DC | PRN

## 2020-08-06 MED ORDER — MIDAZOLAM HCL 2 MG/2ML IJ SOLN
INTRAMUSCULAR | Status: DC | PRN
  Administered 2020-08-06: 2 mg via INTRAVENOUS

## 2020-08-06 MED ORDER — LIDOCAINE 2% (20 MG/ML) 5 ML SYRINGE
INTRAMUSCULAR | Status: DC | PRN
  Administered 2020-08-06: 60 mg via INTRAVENOUS

## 2020-08-06 MED ORDER — DEXAMETHASONE SODIUM PHOSPHATE 10 MG/ML IJ SOLN
INTRAMUSCULAR | Status: DC | PRN
  Administered 2020-08-06: 10 mg via INTRAVENOUS

## 2020-08-06 MED ORDER — CELECOXIB 200 MG PO CAPS: 200.0000 mg | ORAL_CAPSULE | Freq: Once | ORAL | Status: AC

## 2020-08-06 MED ORDER — ACETAMINOPHEN 500 MG PO TABS
ORAL_TABLET | ORAL | Status: AC
  Administered 2020-08-06: 1000 mg via ORAL
  Filled 2020-08-06: qty 2

## 2020-08-06 MED ORDER — HYDROMORPHONE HCL 1 MG/ML IJ SOLN
INTRAMUSCULAR | Status: AC
  Filled 2020-08-06: qty 1

## 2020-08-06 MED ORDER — OXYMETAZOLINE HCL 0.05 % NA SOLN
NASAL | Status: DC | PRN
  Administered 2020-08-06: 2 via NASAL

## 2020-08-06 MED ORDER — IBUPROFEN 800 MG PO TABS: 800.0000 mg | ORAL_TABLET | Freq: Three times a day (TID) | ORAL | 0 refills | Status: DC | PRN

## 2020-08-06 MED ORDER — AMOXICILLIN 500 MG PO CAPS: 500.0000 mg | ORAL_CAPSULE | Freq: Three times a day (TID) | ORAL | 0 refills | Status: DC

## 2020-08-06 MED ORDER — HYDROMORPHONE HCL 1 MG/ML IJ SOLN
0.2500 mg | INTRAMUSCULAR | Status: DC | PRN
  Administered 2020-08-06: 0.5 mg via INTRAVENOUS

## 2020-08-06 MED ORDER — LIDOCAINE 2% (20 MG/ML) 5 ML SYRINGE
INTRAMUSCULAR | Status: AC
  Filled 2020-08-06: qty 5

## 2020-08-06 MED ORDER — LIDOCAINE-EPINEPHRINE 2 %-1:100000 IJ SOLN
INTRAMUSCULAR | Status: DC | PRN
  Administered 2020-08-06: 20 mL via INTRADERMAL

## 2020-08-06 MED ORDER — CEFAZOLIN SODIUM-DEXTROSE 2-3 GM-%(50ML) IV SOLR
INTRAVENOUS | Status: DC | PRN
  Administered 2020-08-06: 2 g via INTRAVENOUS

## 2020-08-06 MED ORDER — SODIUM CHLORIDE 0.9 % IR SOLN
Status: DC | PRN
  Administered 2020-08-06: 1000 mL

## 2020-08-06 MED ORDER — SUGAMMADEX SODIUM 200 MG/2ML IV SOLN
INTRAVENOUS | Status: DC | PRN
  Administered 2020-08-06: 200 mg via INTRAVENOUS

## 2020-08-06 MED ORDER — DEXMEDETOMIDINE HCL 200 MCG/2ML IV SOLN
INTRAVENOUS | Status: DC | PRN
  Administered 2020-08-06 (×3): 4 ug via INTRAVENOUS

## 2020-08-06 MED ORDER — ONDANSETRON HCL 4 MG/2ML IJ SOLN
INTRAMUSCULAR | Status: DC | PRN
  Administered 2020-08-06: 4 mg via INTRAVENOUS

## 2020-08-06 MED ORDER — DEXMEDETOMIDINE (PRECEDEX) IN NS 20 MCG/5ML (4 MCG/ML) IV SYRINGE
PREFILLED_SYRINGE | INTRAVENOUS | Status: AC
  Filled 2020-08-06: qty 10

## 2020-08-06 MED ORDER — ROCURONIUM BROMIDE 10 MG/ML (PF) SYRINGE
PREFILLED_SYRINGE | INTRAVENOUS | Status: DC | PRN
  Administered 2020-08-06: 60 mg via INTRAVENOUS

## 2020-08-06 MED ORDER — CELECOXIB 200 MG PO CAPS
ORAL_CAPSULE | ORAL | Status: AC
  Administered 2020-08-06: 200 mg via ORAL
  Filled 2020-08-06: qty 1

## 2020-08-06 MED ORDER — ACETAMINOPHEN 500 MG PO TABS: 1000.0000 mg | ORAL_TABLET | Freq: Once | ORAL | Status: AC

## 2020-08-06 MED ORDER — FENTANYL CITRATE (PF) 250 MCG/5ML IJ SOLN
INTRAMUSCULAR | Status: AC
  Filled 2020-08-06: qty 5

## 2020-08-06 MED ORDER — 0.9 % SODIUM CHLORIDE (POUR BTL) OPTIME
TOPICAL | Status: DC | PRN
  Administered 2020-08-06: 1000 mL

## 2020-08-06 MED ORDER — MIDAZOLAM HCL 2 MG/2ML IJ SOLN
INTRAMUSCULAR | Status: AC
  Filled 2020-08-06: qty 2

## 2020-08-06 SURGICAL SUPPLY — 41 items
BLADE SURG 15 STRL LF DISP TIS (BLADE) ×1 IMPLANT
BLADE SURG 15 STRL SS (BLADE) ×3
BUR CROSS CUT FISSURE 1.6 (BURR) ×2 IMPLANT
BUR CROSS CUT FISSURE 1.6MM (BURR) ×1
BUR EGG ELITE 4.0 (BURR) ×2 IMPLANT
BUR EGG ELITE 4.0MM (BURR) ×1
CANISTER SUCT 3000ML PPV (MISCELLANEOUS) ×3 IMPLANT
COVER SURGICAL LIGHT HANDLE (MISCELLANEOUS) ×3 IMPLANT
COVER WAND RF STERILE (DRAPES) IMPLANT
DECANTER SPIKE VIAL GLASS SM (MISCELLANEOUS) ×3 IMPLANT
DRAPE U-SHAPE 76X120 STRL (DRAPES) ×3 IMPLANT
GAUZE PACKING FOLDED 2  STR (GAUZE/BANDAGES/DRESSINGS) ×3
GAUZE PACKING FOLDED 2 STR (GAUZE/BANDAGES/DRESSINGS) ×1 IMPLANT
GLOVE BIO SURGEON STRL SZ 6.5 (GLOVE) IMPLANT
GLOVE BIO SURGEON STRL SZ7 (GLOVE) IMPLANT
GLOVE BIO SURGEON STRL SZ8 (GLOVE) ×3 IMPLANT
GLOVE BIO SURGEONS STRL SZ 6.5 (GLOVE)
GLOVE BIOGEL PI IND STRL 6.5 (GLOVE) IMPLANT
GLOVE BIOGEL PI IND STRL 7.0 (GLOVE) IMPLANT
GLOVE BIOGEL PI INDICATOR 6.5 (GLOVE)
GLOVE BIOGEL PI INDICATOR 7.0 (GLOVE)
GOWN STRL REUS W/ TWL LRG LVL3 (GOWN DISPOSABLE) ×1 IMPLANT
GOWN STRL REUS W/ TWL XL LVL3 (GOWN DISPOSABLE) ×1 IMPLANT
GOWN STRL REUS W/TWL LRG LVL3 (GOWN DISPOSABLE) ×3
GOWN STRL REUS W/TWL XL LVL3 (GOWN DISPOSABLE) ×3
IV NS 1000ML (IV SOLUTION) ×3
IV NS 1000ML BAXH (IV SOLUTION) ×1 IMPLANT
KIT BASIN OR (CUSTOM PROCEDURE TRAY) ×3 IMPLANT
KIT TURNOVER KIT B (KITS) ×3 IMPLANT
NDL HYPO 25GX1X1/2 BEV (NEEDLE) ×2 IMPLANT
NEEDLE HYPO 25GX1X1/2 BEV (NEEDLE) ×6 IMPLANT
NS IRRIG 1000ML POUR BTL (IV SOLUTION) ×3 IMPLANT
PAD ARMBOARD 7.5X6 YLW CONV (MISCELLANEOUS) ×3 IMPLANT
SLEEVE IRRIGATION ELITE 7 (MISCELLANEOUS) ×3 IMPLANT
SPONGE SURGIFOAM ABS GEL 12-7 (HEMOSTASIS) IMPLANT
SUT CHROMIC 3 0 PS 2 (SUTURE) ×3 IMPLANT
SYR BULB EAR ULCER 3OZ GRN STR (SYRINGE) ×2 IMPLANT
SYR CONTROL 10ML LL (SYRINGE) ×3 IMPLANT
TRAY ENT MC OR (CUSTOM PROCEDURE TRAY) ×3 IMPLANT
TUBING IRRIGATION (MISCELLANEOUS) ×3 IMPLANT
YANKAUER SUCT BULB TIP NO VENT (SUCTIONS) ×3 IMPLANT

## 2020-08-06 NOTE — Op Note (Signed)
NAMEBRENN, Jermaine [name] MEDICAL RECORD [id number] ACCOUNT [id number] DATE OF BIRTH:05-23-1981 FACILITY: [facility] LOCATION: [facility] PHYSICIAN:Amadeus Oyama M. Janit Cutter, DDS  OPERATIVE REPORT  DATE OF PROCEDURE:  08/06/2020  PREOPERATIVE DIAGNOSIS:  Nonrestorable teeth secondary to dental caries numbers 1, 3, 5, 6, 7, 8, 9, 10, 11, 12, 14, 15, 17, 18, 19, 20, 28, 31.  Impacted tooth #32.  POSTOPERATIVE DIAGNOSIS:  Nonrestorable teeth secondary to dental caries numbers 1, 3, 5, 6, 7, 8, 9, 10, 11, 12, 14, 15, 17, 18, 19, 20, 28, 31.  Impacted tooth #32.  Retained root fragments teeth numbers 4, 13.  PROCEDURE:  Extraction of teeth numbers 1, 3, 4, 5, 6, 7, 8, 9, 10, 11, 12, 13, 14, 15, 17, 18, 19, 20, 28, 31, 32, alveoplasty right and left maxilla, alveoplasty left mandible.  SURGEON:  Diona Browner, DDS  ANESTHESIA:  General, nasal intubation, Dr. Ola Spurr, attending.  DESCRIPTION OF PROCEDURE:  The patient was taken to the operating room and placed on the table in supine position.  General anesthesia was administered and a nasal endotracheal tube was placed and secured.  The eyes were protected and the patient was  draped for surgery.  A timeout was performed.  The posterior pharynx was suctioned and a throat pack was placed, 2% lidocaine 1:100,000 epinephrine was infiltrated in an inferior alveolar block on the right and left sides and in buccal and palatal  infiltration in the maxillary teeth.  A bite block was placed on the right side of the mouth and a sweetheart retractor was used to retract the tongue.  A #15 blade was used to make an incision beginning at tooth #17 and was carried forward in the buccal  and lingual sulcus from 18, 19 to tooth #20.  The periosteum was reflected from around these teeth.  The teeth were elevated with a 301 elevator.  Tooth numbers 17 and 18 were removed with the dental forceps.  Teeth numbers 19 and 20 required removal of  circumferential bone and tooth #19  required sectioning prior to removal of these teeth with the 301 elevator.  The sockets were curetted, irrigated.  The periosteum was reflected to expose the alveolar crest, which was irregular in contour, alveoplasty  was performed using an egg bur followed by the bone file.  Then, the area was irrigated once again and closed with 3-0 chromic.  The left maxilla was operated next.  A 15 blade was used to make an incision in the gingival sulcus starting at tooth #15 and  carried forward to tooth 15, 14, 13, 12, 11, 10, 9, 8 and 7 in the buccal and palatal sulcus.  The periosteum was reflected from around these teeth and then the teeth were elevated with a 301 elevator.  Teeth numbers 14 and 15 were removed with the  forceps.  Tooth #12 and 11 required removal of circumferential bone in order to get the roots out as the crowns fractured while attempting to remove them with the dental forceps.  Teeth numbers 10, 9, 8 and 7 were removed with the forceps in routine  fashion.  The sockets were curetted and it was noted that there was a root fragment in the area of tooth #13.  Bone was removed from around this area and then the root was removed with a 301 elevator.  Then, these sockets were curetted.  The periosteum  was reflected to expose the alveolar crest and alveoplasty was performed using an egg-shaped bur.  Then, the bone file  was used to further smooth the area.  There were several areas where the bone had ankylosed to the roots of the teeth causing defects  of the bone, these were smoothed as best as possible.  Then, the area was irrigated and closed with 3-0 chromic.  The bite block and sweetheart retractor were repositioned to the left side of the mouth.  A 15-blade used to make an incision around tooth  #28 and then overlying tooth #32 and around tooth #31.  The periosteum was reflected.  The teeth were elevated.  Tooth #28 was removed with the dental forceps.  Tooth #32 required removal of bone  overlying the tooth, which was impacted and sectioning of  the tooth and then the roots were removed with a 301 elevator.  Tooth #31 required sectioning of the roots and removal with a 301 elevator as well.  Then, the sockets were curetted and the area was smoothed with a bone file and then closed with 3-0  chromic.  The teeth numbers 1, 3, 5 and 6 were removed in the right maxilla using a 15 blade to make an incision around these teeth.  Teeth numbers 5 and 6 as well as number 3 required sectioning and circumferential bone removal because the roots were  ankylosed to the bone.  Once these teeth were removed, the sockets were curetted.  The periosteum was reflected and alveoplasty was performed and the area was closed with 3-0 chromic.  Then, the oral cavity was irrigated and suctioned and a throat pack  was removed.  The patient was left in care of anesthesia for extubation and transport to recovery room with plans for discharge home through day surgery.  ESTIMATED BLOOD LOSS:  Minimal.  COMPLICATIONS:  None.  SPECIMENS:  None.  HN/NUANCE  D:08/06/2020 T:08/06/2020 JOB:[id number]

## 2020-08-06 NOTE — Op Note (Signed)
08/06/2020  2:26 PM  PATIENT:  Jermaine [name]  39 y.o. male  PRE-OPERATIVE DIAGNOSIS:  NON RESTORABLE TEETH # 1, 3, 5, 6, 7, 8, 9, 10, 11, 12, 14, 15, 17, 18, 19, 20, 28, 31, IMPACTED TOOTH #32.  POST-OPERATIVE DIAGNOSIS:  SAME + ROOT FRAGMENTS TEETH # 4, 13  PROCEDURE:  Procedure(s): EXTRACTION TEETH # 1, 3, 4, 5, 6, 7, 8, 9, 10, 11, 12, 13, 14, 15, 17, 18, 19, 20, 28, 31,  #32. ALVEOLOPLASTY RIGHT AND LEFT MAXILLA, ALVEOLOPLASTY LEFT MANDIBLE   SURGEON:  Surgeon(s): Diona Browner, DDS  ANESTHESIA:   local and general  EBL:  minimal  DRAINS: none   SPECIMEN:  No Specimen  COUNTS:  YES  PLAN OF CARE: Discharge to home after PACU  PATIENT DISPOSITION:  PACU - hemodynamically stable.   PROCEDURE DETAILS: Dictation #  [name], DMD 08/06/2020 2:26 PM

## 2020-08-06 NOTE — Anesthesia Procedure Notes (Signed)
Procedure Name: Intubation Date/Time: 08/06/2020 1:34 PM Performed by: Thelma Comp, CRNA Pre-anesthesia Checklist: Patient identified, Emergency Drugs available, Suction available and Patient being monitored Patient Re-evaluated:Patient Re-evaluated prior to induction Oxygen Delivery Method: Circle System Utilized Preoxygenation: Pre-oxygenation with 100% oxygen Induction Type: IV induction Ventilation: Mask ventilation without difficulty Laryngoscope Size: Glidescope and 4 Grade View: Grade I Nasal Tubes: Nasal Rae, Nasal prep performed and Right Tube size: 7.0 mm Number of attempts: 1 Placement Confirmation: ETT inserted through vocal cords under direct vision,  positive ETCO2 and breath sounds checked- equal and bilateral Tube secured with: Tape Dental Injury: Teeth and Oropharynx as per pre-operative assessment

## 2020-08-06 NOTE — Anesthesia Postprocedure Evaluation (Signed)
Anesthesia Post Note  Patient: Jermaine [name]  Procedure(s) Performed: DENTAL RESTORATION/EXTRACTIONS (N/A Mouth)     Patient location during evaluation: PACU Anesthesia Type: General Level of consciousness: awake and alert Pain management: pain level controlled Vital Signs Assessment: post-procedure vital signs reviewed and stable Respiratory status: spontaneous breathing, nonlabored ventilation and respiratory function stable Cardiovascular status: blood pressure returned to baseline and stable Postop Assessment: no apparent nausea or vomiting Anesthetic complications: no   No complications documented.  Last Vitals:  Vitals:   08/06/20 1454 08/06/20 1509  BP: (!) 139/93 (!) 140/95  Pulse: 86 79  Resp: 15 18  Temp:    SpO2: 97% 96%    Last Pain:  Vitals:   08/06/20 1509  TempSrc:   PainSc: Asleep                 Dorthie Santini,W. EDMOND

## 2020-08-06 NOTE — Transfer of Care (Signed)
Immediate Anesthesia Transfer of Care Note  Patient: Jermaine [name]  Procedure(s) Performed: DENTAL RESTORATION/EXTRACTIONS (N/A Mouth)  Patient Location: PACU  Anesthesia Type:General  Level of Consciousness: drowsy and patient cooperative  Airway & Oxygen Therapy: Patient Spontanous Breathing  Post-op Assessment: Report given to RN and Post -op Vital signs reviewed and stable  Post vital signs: Reviewed and stable  Last Vitals:  Vitals Value Taken Time  BP 141/92 08/06/20 1438  Temp    Pulse 93 08/06/20 1438  Resp 19 08/06/20 1438  SpO2 97 % 08/06/20 1438  Vitals shown include unvalidated device data.  Last Pain:  Vitals:   08/06/20 1249  TempSrc: Temporal         Complications: No complications documented.

## 2020-08-06 NOTE — H&P (Signed)
Anesthesia H&P Update: History and Physical Exam reviewed; patient is OK for planned anesthetic and procedure. ? ?
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
## 2020-08-07 ENCOUNTER — Encounter (HOSPITAL_COMMUNITY): Payer: Self-pay | Admitting: Oral Surgery

## 2020-08-29 ENCOUNTER — Telehealth: Payer: Self-pay

## 2020-08-29 NOTE — Telephone Encounter (Signed)
Do you have a blue sheet on this pt? If not can you send to Dr. Sharol Given pt wants ankle scope as discussed at last office visit.

## 2020-08-29 NOTE — Telephone Encounter (Signed)
Patient called he stated Dr.Duda told him to call back If his ankle doesn't get better he would like a call back regarding what can be next he stated Dr.Duda told him he would have to "clean it out" . CB:[phone]

## 2020-08-30 NOTE — Telephone Encounter (Signed)
I attempted to call Mr. Petsch to discuss scheduling surgery.  Voice message stated "your call cannot be completed at this time".  I will try again at a later date.

## 2020-09-03 ENCOUNTER — Telehealth: Payer: Self-pay | Admitting: Orthopedic Surgery

## 2020-09-03 ENCOUNTER — Emergency Department (HOSPITAL_COMMUNITY): Payer: Medicaid Other

## 2020-09-03 ENCOUNTER — Encounter (HOSPITAL_COMMUNITY): Payer: Self-pay | Admitting: Emergency Medicine

## 2020-09-03 ENCOUNTER — Other Ambulatory Visit: Payer: Self-pay

## 2020-09-03 ENCOUNTER — Emergency Department (HOSPITAL_COMMUNITY)
Admission: EM | Admit: 2020-09-03 | Discharge: 2020-09-03 | Disposition: A | Discharge status: Home / Self Care | Payer: Medicaid Other | Attending: Emergency Medicine | Admitting: Emergency Medicine

## 2020-09-03 DIAGNOSIS — Y9389 Activity, other specified: Secondary | ICD-10-CM | POA: Insufficient documentation

## 2020-09-03 DIAGNOSIS — Z79899 Other long term (current) drug therapy: Secondary | ICD-10-CM | POA: Insufficient documentation

## 2020-09-03 DIAGNOSIS — F17210 Nicotine dependence, cigarettes, uncomplicated: Secondary | ICD-10-CM | POA: Diagnosis not present

## 2020-09-03 DIAGNOSIS — Y92009 Unspecified place in unspecified non-institutional (private) residence as the place of occurrence of the external cause: Secondary | ICD-10-CM | POA: Insufficient documentation

## 2020-09-03 DIAGNOSIS — W868XXA Exposure to other electric current, initial encounter: Secondary | ICD-10-CM | POA: Insufficient documentation

## 2020-09-03 DIAGNOSIS — E119 Type 2 diabetes mellitus without complications: Secondary | ICD-10-CM | POA: Insufficient documentation

## 2020-09-03 DIAGNOSIS — Z96651 Presence of right artificial knee joint: Secondary | ICD-10-CM | POA: Insufficient documentation

## 2020-09-03 DIAGNOSIS — I2510 Atherosclerotic heart disease of native coronary artery without angina pectoris: Secondary | ICD-10-CM | POA: Insufficient documentation

## 2020-09-03 DIAGNOSIS — T23221A Burn of second degree of single right finger (nail) except thumb, initial encounter: Secondary | ICD-10-CM | POA: Diagnosis present

## 2020-09-03 DIAGNOSIS — I10 Essential (primary) hypertension: Secondary | ICD-10-CM | POA: Diagnosis not present

## 2020-09-03 MED ORDER — HYDROMORPHONE HCL 2 MG/ML IJ SOLN
2.0000 mg | Freq: Once | INTRAMUSCULAR | Status: AC
  Administered 2020-09-03: 13:00:00 2 mg via INTRAMUSCULAR
  Filled 2020-09-03: qty 1

## 2020-09-03 MED ORDER — CEFTRIAXONE SODIUM 1 G IJ SOLR
1.0000 g | Freq: Once | INTRAMUSCULAR | Status: AC
  Administered 2020-09-03: 13:00:00 1 g via INTRAMUSCULAR
  Filled 2020-09-03: qty 10

## 2020-09-03 MED ORDER — SULFAMETHOXAZOLE-TRIMETHOPRIM 800-160 MG PO TABS: 1.0000 | ORAL_TABLET | Freq: Two times a day (BID) | ORAL | 0 refills | Status: DC

## 2020-09-03 MED ORDER — LIDOCAINE HCL (PF) 1 % IJ SOLN
INTRAMUSCULAR | Status: AC
  Filled 2020-09-03: qty 5
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
## 2020-09-03 NOTE — Discharge Instructions (Addendum)
Use Bacitracin ointment instead of silvadene.   Return if increased redness or swelling
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
## 2020-09-04 NOTE — Telephone Encounter (Signed)
Per ED provider-   I spoke to Dr. Amedeo Kinsman who can see pt in office on Friday.  Pt advised to call for appointment on Friday.   I would ask Dr Debara Pickett where best to work the patient in.

## 2020-09-04 NOTE — Telephone Encounter (Signed)
Called back to patient 3 more times - no voice mail set up. Also called the DPR listed in chart, and was told not to call this number again.

## 2020-09-04 NOTE — Telephone Encounter (Signed)
Called back to patient to offer appointment with Dr Amedeo Kinsman for Friday, 09/06/20 - no voice mail set up. Will keep trying.

## 2020-09-05 NOTE — Telephone Encounter (Signed)
Called back to patient this morning and reached. Scheduled appointment with Dr Amedeo Kinsman for tomorrow, 09/06/20; aware.

## 2020-09-05 NOTE — Telephone Encounter (Signed)
Thank you, please keep trying to reach him.

## 2020-09-06 ENCOUNTER — Ambulatory Visit: Payer: Medicaid Other | Admitting: Orthopedic Surgery

## 2020-09-09 ENCOUNTER — Encounter: Payer: Self-pay | Admitting: Orthopedic Surgery

## 2020-09-09 ENCOUNTER — Other Ambulatory Visit: Payer: Self-pay | Admitting: Physician Assistant

## 2020-09-09 ENCOUNTER — Telehealth: Payer: Self-pay | Admitting: Orthopedic Surgery

## 2020-09-09 NOTE — Telephone Encounter (Signed)
I called back to patient to follow up on missed appointment Friday, 09/06/20. Aware we had sent a letter. States misunderstood the date.  Offered this Wednesday, 09/11/20, with Dr Amedeo Kinsman. Patient states that is the day he is having surgery by Dr Sharol Given on his ankle. Aware to call when able to reschedule for the finger problem.  Relayed we will message our administrator if any further advice.

## 2020-09-10 ENCOUNTER — Other Ambulatory Visit (HOSPITAL_COMMUNITY)
Admission: RE | Admit: 2020-09-10 | Discharge: 2020-09-10 | Disposition: A | Discharge status: Home / Self Care | Payer: Medicaid Other | Source: Ambulatory Visit | Attending: Orthopedic Surgery | Admitting: Orthopedic Surgery

## 2020-09-10 ENCOUNTER — Encounter (HOSPITAL_COMMUNITY): Payer: Self-pay | Admitting: Orthopedic Surgery

## 2020-09-10 DIAGNOSIS — Z01812 Encounter for preprocedural laboratory examination: Secondary | ICD-10-CM | POA: Diagnosis not present

## 2020-09-10 DIAGNOSIS — Z20822 Contact with and (suspected) exposure to covid-19: Secondary | ICD-10-CM | POA: Diagnosis not present

## 2020-09-10 LAB — SARS CORONAVIRUS 2 (TAT 6-24 HRS): SARS Coronavirus 2: NEGATIVE

## 2020-09-10 NOTE — Anesthesia Preprocedure Evaluation (Addendum)
Anesthesia Evaluation  Patient identified by MRN, date of birth, ID band Patient awake    Reviewed: Allergy & Precautions, NPO status , Patient's Chart, lab work & pertinent test results  Airway Mallampati: II  TM Distance: >3 FB Neck ROM: Full    Dental no notable dental hx. (+) Edentulous Upper, Dental Advisory Given   Pulmonary asthma , Current SmokerPatient did not abstain from smoking.,    Pulmonary exam normal breath sounds clear to auscultation       Cardiovascular hypertension, Pt. on medications + CAD and + Past MI  Normal cardiovascular exam Rhythm:Regular Rate:Normal     Neuro/Psych Seizures -,  Depression Bipolar Disorder Last Sz 6 weeks ago    GI/Hepatic Neg liver ROS, GERD  ,  Endo/Other  diabetes, Well Controlled, Type 2, Oral Hypoglycemic Agents  Renal/GU      Musculoskeletal   Abdominal   Peds  Hematology   Anesthesia Other Findings ALL Seroquel, hydrocodone, trazadone  Reproductive/Obstetrics                            Anesthesia Physical Anesthesia Plan  ASA: IV  Anesthesia Plan: Regional and MAC   Post-op Pain Management:    Induction:   PONV Risk Score and Plan: Treatment may vary due to age or medical condition and Ondansetron  Airway Management Planned: Nasal Cannula and Natural Airway  Additional Equipment: None  Intra-op Plan:   Post-operative Plan:   Informed Consent: I have reviewed the patients History and Physical, chart, labs and discussed the procedure including the risks, benefits and alternatives for the proposed anesthesia with the patient or authorized representative who has indicated his/her understanding and acceptance.     Dental advisory given  Plan Discussed with: CRNA and Anesthesiologist  Anesthesia Plan Comments: (LPop block)       Anesthesia Quick Evaluation

## 2020-09-10 NOTE — Progress Notes (Signed)
Spoke with pt for pre-op call. Pt has a distant hx of CAD (when he was in his twenties) No longer sees a cardiologist. Dr. Delphina Cahill is his PCP. See Karoline Caldwell' note from 08/05/20.  Pt is a type 2 diabetic. Last A1C was 5.1 on 02/01/20. He is not on any medications. He states his fasting blood sugar is usually in the low 70's. Instructed pt to check his blood sugar when he gets up in the morning. If blood sugar is 70 or below, treat with 1/2 cup of clear juice (apple or cranberry) and recheck blood sugar 15 minutes after drinking juice. Instructed pt to let his nurse know on arrival if he had to drink the juice. Pt voiced understanding.  Covid test done this AM, results pending.  Pt states he's been in quarantine since the test was done and understands that he stays in quarantine until he comes to the hospital tomorrow.

## 2020-09-11 ENCOUNTER — Ambulatory Visit (HOSPITAL_COMMUNITY)
Admission: RE | Admit: 2020-09-11 | Discharge: 2020-09-11 | Disposition: A | Discharge status: Home / Self Care | Payer: Medicaid Other | Attending: Orthopedic Surgery | Admitting: Orthopedic Surgery

## 2020-09-11 ENCOUNTER — Encounter (HOSPITAL_COMMUNITY)
Admission: RE | Disposition: A | Discharge status: Home / Self Care | Payer: Self-pay | Source: Home / Self Care | Attending: Orthopedic Surgery

## 2020-09-11 ENCOUNTER — Other Ambulatory Visit: Payer: Self-pay | Admitting: Physician Assistant

## 2020-09-11 ENCOUNTER — Encounter (HOSPITAL_COMMUNITY): Payer: Self-pay | Admitting: Orthopedic Surgery

## 2020-09-11 ENCOUNTER — Ambulatory Visit (HOSPITAL_COMMUNITY): Payer: Medicaid Other | Admitting: Anesthesiology

## 2020-09-11 ENCOUNTER — Other Ambulatory Visit: Payer: Self-pay

## 2020-09-11 DIAGNOSIS — Z79890 Hormone replacement therapy (postmenopausal): Secondary | ICD-10-CM | POA: Diagnosis not present

## 2020-09-11 DIAGNOSIS — Z885 Allergy status to narcotic agent status: Secondary | ICD-10-CM | POA: Diagnosis not present

## 2020-09-11 DIAGNOSIS — Z79899 Other long term (current) drug therapy: Secondary | ICD-10-CM | POA: Diagnosis not present

## 2020-09-11 DIAGNOSIS — F17200 Nicotine dependence, unspecified, uncomplicated: Secondary | ICD-10-CM | POA: Diagnosis not present

## 2020-09-11 DIAGNOSIS — M24072 Loose body in left ankle: Secondary | ICD-10-CM | POA: Diagnosis not present

## 2020-09-11 DIAGNOSIS — Z888 Allergy status to other drugs, medicaments and biological substances status: Secondary | ICD-10-CM | POA: Insufficient documentation

## 2020-09-11 DIAGNOSIS — M25572 Pain in left ankle and joints of left foot: Secondary | ICD-10-CM | POA: Insufficient documentation

## 2020-09-11 DIAGNOSIS — M25872 Other specified joint disorders, left ankle and foot: Secondary | ICD-10-CM

## 2020-09-11 HISTORY — PX: ANKLE ARTHROSCOPY: SHX545

## 2020-09-11 HISTORY — DX: Unspecified osteoarthritis, unspecified site: M19.90

## 2020-09-11 HISTORY — DX: Unspecified asthma, uncomplicated: J45.909

## 2020-09-11 LAB — BASIC METABOLIC PANEL
Anion gap: 11 (ref 5–15)
BUN: 10 mg/dL (ref 6–20)
CO2: 22 mmol/L (ref 22–32)
Calcium: 9.1 mg/dL (ref 8.9–10.3)
Chloride: 104 mmol/L (ref 98–111)
Creatinine, Ser: 0.88 mg/dL (ref 0.61–1.24)
GFR, Estimated: >60 mL/min (ref >60)
Glucose, Bld: 90 mg/dL (ref 70–99)
Potassium: 3.8 mmol/L (ref 3.5–5.1)
Sodium: 137 mmol/L (ref 135–145)

## 2020-09-11 LAB — CBC
HCT: 42.9 % (ref 39.0–52.0)
Hemoglobin: 14.4 g/dL (ref 13.0–17.0)
MCH: 32 pg (ref 26.0–34.0)
MCHC: 33.6 g/dL (ref 30.0–36.0)
MCV: 95.3 fL (ref 80.0–100.0)
Platelets: 195 10*3/uL (ref 150–400)
RBC: 4.5 MIL/uL (ref 4.22–5.81)
RDW: 12.3 % (ref 11.5–15.5)
WBC: 6.7 10*3/uL (ref 4.0–10.5)
nRBC: 0 % (ref 0.0–0.2)

## 2020-09-11 LAB — GLUCOSE, CAPILLARY
Glucose-Capillary: 92 mg/dL (ref 70–99)
Glucose-Capillary: 93 mg/dL (ref 70–99)

## 2020-09-11 SURGERY — ARTHROSCOPY, ANKLE
Anesthesia: Monitor Anesthesia Care | Site: Ankle | Laterality: Left

## 2020-09-11 MED ORDER — FENTANYL CITRATE (PF) 250 MCG/5ML IJ SOLN
INTRAMUSCULAR | Status: DC | PRN
  Administered 2020-09-11: 100 ug via INTRAVENOUS

## 2020-09-11 MED ORDER — LACTATED RINGERS IV SOLN: INTRAVENOUS | Status: DC

## 2020-09-11 MED ORDER — CLONIDINE HCL (ANALGESIA) 100 MCG/ML EP SOLN
EPIDURAL | Status: DC | PRN
  Administered 2020-09-11: 100 ug

## 2020-09-11 MED ORDER — OXYCODONE-ACETAMINOPHEN 10-325 MG PO TABS: 1.0000 | ORAL_TABLET | ORAL | 0 refills | Status: DC | PRN

## 2020-09-11 MED ORDER — BUPIVACAINE HCL (PF) 0.25 % IJ SOLN
INTRAMUSCULAR | Status: AC
  Filled 2020-09-11: qty 30

## 2020-09-11 MED ORDER — CEFAZOLIN SODIUM-DEXTROSE 2-4 GM/100ML-% IV SOLN
2.0000 g | INTRAVENOUS | Status: AC
  Administered 2020-09-11: 08:00:00 2 g via INTRAVENOUS
  Filled 2020-09-11: qty 100

## 2020-09-11 MED ORDER — ONDANSETRON HCL 4 MG/2ML IJ SOLN: 4.0000 mg | Freq: Once | INTRAMUSCULAR | Status: DC | PRN

## 2020-09-11 MED ORDER — FENTANYL CITRATE (PF) 100 MCG/2ML IJ SOLN
25.0000 ug | INTRAMUSCULAR | Status: DC | PRN
  Administered 2020-09-11: 50 ug via INTRAVENOUS

## 2020-09-11 MED ORDER — FENTANYL CITRATE (PF) 250 MCG/5ML IJ SOLN
INTRAMUSCULAR | Status: AC
  Filled 2020-09-11: qty 5

## 2020-09-11 MED ORDER — PROPOFOL 10 MG/ML IV BOLUS
INTRAVENOUS | Status: AC
  Filled 2020-09-11: qty 20

## 2020-09-11 MED ORDER — ACETAMINOPHEN 10 MG/ML IV SOLN: 1000.0000 mg | Freq: Once | INTRAVENOUS | Status: DC | PRN

## 2020-09-11 MED ORDER — OXYCODONE-ACETAMINOPHEN 5-325 MG PO TABS
ORAL_TABLET | ORAL | Status: AC
  Filled 2020-09-11: qty 2

## 2020-09-11 MED ORDER — MIDAZOLAM HCL 2 MG/2ML IJ SOLN
INTRAMUSCULAR | Status: AC
  Filled 2020-09-11: qty 2

## 2020-09-11 MED ORDER — CHLORHEXIDINE GLUCONATE 0.12 % MT SOLN
15.0000 mL | Freq: Once | OROMUCOSAL | Status: AC
  Administered 2020-09-11: 06:00:00 15 mL via OROMUCOSAL
  Filled 2020-09-11: qty 15

## 2020-09-11 MED ORDER — SODIUM CHLORIDE 0.9 % IR SOLN
Status: DC | PRN
  Administered 2020-09-11: 3000 mL

## 2020-09-11 MED ORDER — ROPIVACAINE HCL 7.5 MG/ML IJ SOLN
INTRAMUSCULAR | Status: DC | PRN
  Administered 2020-09-11: 20 mL via PERINEURAL

## 2020-09-11 MED ORDER — ORAL CARE MOUTH RINSE: 15.0000 mL | Freq: Once | OROMUCOSAL | Status: AC

## 2020-09-11 MED ORDER — PROPOFOL 10 MG/ML IV BOLUS
INTRAVENOUS | Status: DC | PRN
  Administered 2020-09-11 (×3): 20 mg via INTRAVENOUS

## 2020-09-11 MED ORDER — PROPOFOL 500 MG/50ML IV EMUL
INTRAVENOUS | Status: DC | PRN
  Administered 2020-09-11: 100 ug/kg/min via INTRAVENOUS

## 2020-09-11 MED ORDER — FENTANYL CITRATE (PF) 100 MCG/2ML IJ SOLN
INTRAMUSCULAR | Status: AC
  Administered 2020-09-11: 09:00:00 50 ug via INTRAVENOUS
  Filled 2020-09-11: qty 2

## 2020-09-11 MED ORDER — MIDAZOLAM HCL 2 MG/2ML IJ SOLN
INTRAMUSCULAR | Status: DC | PRN
  Administered 2020-09-11: 2 mg via INTRAVENOUS

## 2020-09-11 MED ORDER — OXYCODONE-ACETAMINOPHEN 5-325 MG PO TABS
2.0000 | ORAL_TABLET | Freq: Once | ORAL | Status: AC
  Administered 2020-09-11: 2 via ORAL

## 2020-09-11 MED ORDER — DEXMEDETOMIDINE (PRECEDEX) IN NS 20 MCG/5ML (4 MCG/ML) IV SYRINGE
PREFILLED_SYRINGE | INTRAVENOUS | Status: DC | PRN
  Administered 2020-09-11: 12 ug via INTRAVENOUS
  Administered 2020-09-11: 8 ug via INTRAVENOUS

## 2020-09-11 SURGICAL SUPPLY — 36 items
BLADE CUDA 5.5 (BLADE) IMPLANT
BLADE EXCALIBUR 4.0MM X 13CM (MISCELLANEOUS) ×1
BLADE EXCALIBUR 4.0X13 (MISCELLANEOUS) ×1 IMPLANT
BNDG COHESIVE 6X5 TAN STRL LF (GAUZE/BANDAGES/DRESSINGS) ×3 IMPLANT
BNDG GAUZE ELAST 4 BULKY (GAUZE/BANDAGES/DRESSINGS) ×2 IMPLANT
BUR OVAL 4.0 (BURR) IMPLANT
COVER SURGICAL LIGHT HANDLE (MISCELLANEOUS) ×4 IMPLANT
DRAPE ARTHROSCOPY W/POUCH 114 (DRAPES) ×3 IMPLANT
DRAPE U-SHAPE 47X51 STRL (DRAPES) ×3 IMPLANT
DRSG EMULSION OIL 3X3 NADH (GAUZE/BANDAGES/DRESSINGS) ×3 IMPLANT
DRSG PAD ABDOMINAL 8X10 ST (GAUZE/BANDAGES/DRESSINGS) ×5 IMPLANT
DURAPREP 26ML APPLICATOR (WOUND CARE) ×3 IMPLANT
GAUZE SPONGE 4X4 12PLY STRL (GAUZE/BANDAGES/DRESSINGS) ×3 IMPLANT
GLOVE BIOGEL PI IND STRL 9 (GLOVE) ×1 IMPLANT
GLOVE BIOGEL PI INDICATOR 9 (GLOVE) ×2
GLOVE SURG ORTHO 9.0 STRL STRW (GLOVE) ×3 IMPLANT
GOWN STRL REUS W/ TWL XL LVL3 (GOWN DISPOSABLE) ×3 IMPLANT
GOWN STRL REUS W/TWL XL LVL3 (GOWN DISPOSABLE) ×9
KIT BASIN OR (CUSTOM PROCEDURE TRAY) ×3 IMPLANT
KIT TURNOVER KIT B (KITS) ×3 IMPLANT
MANIFOLD NEPTUNE II (INSTRUMENTS) ×3 IMPLANT
NDL 18GX1X1/2 (RX/OR ONLY) (NEEDLE) ×1 IMPLANT
NEEDLE 18GX1X1/2 (RX/OR ONLY) (NEEDLE) ×3 IMPLANT
PACK ARTHROSCOPY DSU (CUSTOM PROCEDURE TRAY) ×3 IMPLANT
PORT APPOLLO RF 90DEGREE MULTI (SURGICAL WAND) IMPLANT
PROBE APOLLO 90XL (SURGICAL WAND) ×2 IMPLANT
SPONGE LAP 4X18 RFD (DISPOSABLE) ×3 IMPLANT
SUT ETHILON 4 0 PS 2 18 (SUTURE) ×3 IMPLANT
SUT MNCRL AB 3-0 PS2 18 (SUTURE) IMPLANT
SUT VIC AB 2-0 CT1 27 (SUTURE)
SUT VIC AB 2-0 CT1 TAPERPNT 27 (SUTURE) IMPLANT
SYR 20ML LL LF (SYRINGE) ×3 IMPLANT
TAPE STRIPS DRAPE STRL (GAUZE/BANDAGES/DRESSINGS) IMPLANT
TOWEL GREEN STERILE (TOWEL DISPOSABLE) ×3 IMPLANT
TOWEL GREEN STERILE FF (TOWEL DISPOSABLE) ×3 IMPLANT
TUBING ARTHROSCOPY IRRIG 16FT (MISCELLANEOUS) ×3 IMPLANT

## 2020-09-11 NOTE — Anesthesia Postprocedure Evaluation (Signed)
Anesthesia Post Note  Patient: [name]  Procedure(s) Performed: LEFT ANKLE ARTHROSCOPY WITH DEBRIDEMENT (Left Ankle)     Patient location during evaluation: PACU Anesthesia Type: Regional Level of consciousness: awake and alert Pain management: pain level controlled Vital Signs Assessment: post-procedure vital signs reviewed and stable Respiratory status: spontaneous breathing, nonlabored ventilation, respiratory function stable and patient connected to nasal cannula oxygen Cardiovascular status: stable and blood pressure returned to baseline Postop Assessment: no apparent nausea or vomiting Anesthetic complications: no   No complications documented.  Last Vitals:  Vitals:   09/11/20 0855 09/11/20 0915  BP: (!) 136/111   Pulse: 74 (!) (P) 0  Resp: (!) 22 19  Temp:  (!) 36.2 C  SpO2: 100% 99%    Last Pain:  Vitals:   09/11/20 0915  TempSrc:   PainSc: 5                  Barnet Glasgow

## 2020-09-11 NOTE — Transfer of Care (Signed)
Immediate Anesthesia Transfer of Care Note  Patient: [name]  Procedure(s) Performed: LEFT ANKLE ARTHROSCOPY WITH DEBRIDEMENT (Left Ankle)  Patient Location: PACU  Anesthesia Type:MAC combined with regional for post-op pain  Level of Consciousness: awake, alert  and oriented  Airway & Oxygen Therapy: Patient Spontanous Breathing  Post-op Assessment: Report given to RN, Post -op Vital signs reviewed and stable and Patient moving all extremities  Post vital signs: Reviewed and stable  Last Vitals:  Vitals Value Taken Time  BP 122/84 09/11/20 0824  Temp    Pulse 67 09/11/20 0824  Resp 15 09/11/20 0824  SpO2 100 % 09/11/20 0824  Vitals shown include unvalidated device data.  Last Pain:  Vitals:   09/11/20 0627  TempSrc:   PainSc: 8       Patients Stated Pain Goal: 3 (58/59/29 2446)  Complications: No complications documented.

## 2020-09-11 NOTE — Anesthesia Procedure Notes (Addendum)
Anesthesia Regional Block: Adductor canal block   Pre-Anesthetic Checklist: ,, timeout performed, Correct Patient, Correct Site, Correct Laterality, Correct Procedure, Correct Position, site marked, Risks and benefits discussed,  Surgical consent,  Pre-op evaluation,  At surgeon's request and post-op pain management  Laterality: Lower and Left  Prep: chloraprep       Needles:  Injection technique: Single-shot  Needle Type: Echogenic Needle     Needle Length: 9cm  Needle Gauge: 22     Additional Needles:   Procedures:,,,, ultrasound used (permanent image in chart),,,,  Narrative:  Start time: 09/11/2020 7:03 AM End time: 09/11/2020 7:10 AM Injection made incrementally with aspirations every 5 mL.  Performed by: Personally  Anesthesiologist: Barnet Glasgow, MD  Additional Notes: Block assessed prior to surgery. Pt tolerated procedure well.

## 2020-09-11 NOTE — Op Note (Signed)
09/11/2020  8:30 AM  PATIENT:  [name]    PRE-OPERATIVE DIAGNOSIS:  Left Ankle Loose Body, Impingement  POST-OPERATIVE DIAGNOSIS:  Same  PROCEDURE:  LEFT ANKLE ARTHROSCOPY WITH DEBRIDEMENT  SURGEON:  Newt Minion, MD  PHYSICIAN ASSISTANT:None ANESTHESIA:   General  PREOPERATIVE INDICATIONS:  Jermaine [name] is a  39 y.o. male with a diagnosis of Left Ankle Loose Body, Impingement who failed conservative measures and elected for surgical management.    The risks benefits and alternatives were discussed with the patient preoperatively including but not limited to the risks of infection, bleeding, nerve injury, cardiopulmonary complications, the need for revision surgery, among others, and the patient was willing to proceed.  OPERATIVE IMPLANTS: none  @ENCIMAGES @  OPERATIVE FINDINGS: Synovitis with impingement with loose body in the tibiotalar joint.  OPERATIVE PROCEDURE: Patient was brought the operating room and underwent a regional anesthetic.  After adequate levels anesthesia were obtained patient's left lower extremity was prepped using DuraPrep draped into a sterile field a timeout was called.  The skin was incised over the anterior medial portal blunt dissection was carried down to the capsule and a blunt trocar was used inserted into the joint.  The lateral portal was also established with a skin incision blunt dissection carried down the capsule and a blunt trocar to use inserted into the joint.  Examination showed significant amount of synovitis and there was a loose body in the tibial talar joint.  Using the electrical wand and shaver a synovectomy was performed the medial and lateral gutters were cleansed anteriorly was debrided of the synovitis and loose body was removed from within the joint.  Electrocautery was used for hemostasis after completion the medial and lateral gutters and tibial talar joint were examined and there were no loose bodies.  The instruments were  removed portals were closed using 3-0 nylon a sterile dressing was applied patient was taken the PACU condition.   DISCHARGE PLANNING:  Antibiotic duration:pre operative  Weightbearing: WBAT  Pain medication: percocet  Dressing care/ Wound VAC:d/c dressing in 2 days  Ambulatory devices:crutches  Discharge to: home   Follow-up: In the office 1 week post operative.

## 2020-09-11 NOTE — Interval H&P Note (Signed)
History and Physical Interval Note:  09/11/2020 6:41 AM  Jermaine [name]  has presented today for surgery, with the diagnosis of Left Ankle Loose Body, Impingement.  The various methods of treatment have been discussed with the patient and family. After consideration of risks, benefits and other options for treatment, the patient has consented to  Procedure(s): LEFT ANKLE ARTHROSCOPY WITH DEBRIDEMENT (Left) as a surgical intervention.  The patient's history has been reviewed, patient examined, no change in status, stable for surgery.  I have reviewed the patient's chart and labs.  Questions were answered to the patient's satisfaction.     Newt Minion
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
## 2020-09-11 NOTE — Progress Notes (Signed)
Orthopedic Tech Progress Note Patient Details:  Jermaine [name] January 15, 1981 [id number] PACU RN called requesting a PAIR OF CRUTCHES AND POST OP SHOE Ortho Devices Type of Ortho Device: Crutches,Postop shoe/boot Ortho Device/Splint Location: LLE Ortho Device/Splint Interventions: Application,Adjustment   Post Interventions Patient Tolerated: Well Instructions Provided: Care of device   Janit Pagan 09/11/2020, 9:39 AM

## 2020-09-11 NOTE — Anesthesia Procedure Notes (Signed)
Procedure Name: MAC Date/Time: 09/11/2020 7:37 AM Performed by: Amadeo Garnet, CRNA Pre-anesthesia Checklist: Patient identified, Emergency Drugs available, Suction available and Patient being monitored Patient Re-evaluated:Patient Re-evaluated prior to induction Oxygen Delivery Method: Simple face mask Preoxygenation: Pre-oxygenation with 100% oxygen Induction Type: IV induction Placement Confirmation: positive ETCO2 Dental Injury: Teeth and Oropharynx as per pre-operative assessment

## 2020-09-12 ENCOUNTER — Encounter (HOSPITAL_COMMUNITY): Payer: Self-pay | Admitting: Orthopedic Surgery

## 2020-10-10 ENCOUNTER — Telehealth: Payer: Self-pay

## 2020-10-10 NOTE — Telephone Encounter (Signed)
Patient called stating that he is having left side low back pain that is radiating down the left leg into his left foot and that his left GRT toe is numb.  Would like a call back? Patient has an appointment on Monday, 10/14/2020.  Cb# [phone].  Please advise.  Thank you.

## 2020-10-11 NOTE — Telephone Encounter (Signed)
Pt s/p left ankle scope and debridement 09/11/20 and has not come back into the office for eval. Advised the pt that we have not examined his back before and that he would need ov for this. Voiced understanding and will keep appt on Monday.

## 2020-10-14 ENCOUNTER — Ambulatory Visit (INDEPENDENT_AMBULATORY_CARE_PROVIDER_SITE_OTHER): Payer: Medicaid Other | Admitting: Orthopedic Surgery

## 2020-10-14 ENCOUNTER — Encounter: Payer: Self-pay | Admitting: Orthopedic Surgery

## 2020-10-14 VITALS — Ht 74.0 in | Wt 225.0 lb

## 2020-10-14 DIAGNOSIS — M5116 Intervertebral disc disorders with radiculopathy, lumbar region: Secondary | ICD-10-CM
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
## 2020-10-29 ENCOUNTER — Other Ambulatory Visit: Payer: Self-pay

## 2020-10-29 ENCOUNTER — Ambulatory Visit (HOSPITAL_COMMUNITY)
Admission: RE | Admit: 2020-10-29 | Discharge: 2020-10-29 | Disposition: A | Discharge status: Home / Self Care | Payer: Medicaid Other | Source: Ambulatory Visit | Attending: Orthopedic Surgery | Admitting: Orthopedic Surgery

## 2020-10-29 DIAGNOSIS — M5116 Intervertebral disc disorders with radiculopathy, lumbar region: Secondary | ICD-10-CM | POA: Diagnosis present

## 2020-11-04 ENCOUNTER — Ambulatory Visit: Payer: Medicaid Other | Admitting: Orthopedic Surgery

## 2020-11-07 ENCOUNTER — Encounter: Payer: Self-pay | Admitting: Orthopedic Surgery

## 2020-11-07 ENCOUNTER — Ambulatory Visit (INDEPENDENT_AMBULATORY_CARE_PROVIDER_SITE_OTHER): Payer: Medicaid Other | Admitting: Orthopedic Surgery

## 2020-11-07 DIAGNOSIS — M25561 Pain in right knee: Secondary | ICD-10-CM | POA: Diagnosis not present

## 2020-11-07 DIAGNOSIS — M5116 Intervertebral disc disorders with radiculopathy, lumbar region: Secondary | ICD-10-CM

## 2020-11-07 DIAGNOSIS — M25572 Pain in left ankle and joints of left foot: Secondary | ICD-10-CM

## 2020-11-07 DIAGNOSIS — G8929 Other chronic pain: Secondary | ICD-10-CM | POA: Diagnosis not present
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
## 2020-11-20 ENCOUNTER — Encounter: Payer: Self-pay | Admitting: Physical Medicine and Rehabilitation

## 2020-11-20 ENCOUNTER — Ambulatory Visit (INDEPENDENT_AMBULATORY_CARE_PROVIDER_SITE_OTHER): Payer: Medicaid Other | Admitting: Physical Medicine and Rehabilitation

## 2020-11-20 ENCOUNTER — Ambulatory Visit: Payer: Self-pay

## 2020-11-20 DIAGNOSIS — M5416 Radiculopathy, lumbar region: Secondary | ICD-10-CM

## 2020-11-20 MED ORDER — BETAMETHASONE SOD PHOS & ACET 6 (3-3) MG/ML IJ SUSP
12.0000 mg | Freq: Once | INTRAMUSCULAR | Status: AC
  Administered 2020-11-20: 12 mg

## 2020-11-20 NOTE — Patient Instructions (Signed)

## 2020-11-20 NOTE — Progress Notes (Signed)
Low back pain with pain, numbness, and tingling in left leg to foot. Numeric Pain Rating Scale and Functional Assessment Average Pain 7   In the last MONTH (on 0-10 scale) has pain interfered with the following?  1. General activity like being  able to carry out your everyday physical activities such as walking, climbing stairs, carrying groceries, or moving a chair?  Rating(10)   +Driver, -BT, -Dye Allergies.

## 2020-11-20 NOTE — Progress Notes (Signed)
Jermaine [name] - 40 y.o. male MRN [id number]  Date of birth: 1981-08-30  Office Visit Note: Visit Date: 11/20/2020 PCP: [name], MD Referred by: [name], MD  Subjective: Chief Complaint  Patient presents with  . Lower Back - Pain   HPI:  Jermaine [name] is a 40 y.o. male who comes in today at the request of Dr. Meridee Score for planned Left S1-2 Lumbar epidural steroid injection with fluoroscopic guidance.  The patient has failed conservative care including home exercise, medications, time and activity modification.  This injection will be diagnostic and hopefully therapeutic.  Please see requesting physician notes for further details and justification. MRI reviewed with images and spine model.  MRI reviewed in the note below.  He demonstrates the numbness in his left foot by beating on his foot with his hand.  This is somewhat nondermatomal somewhat L5 and S1 distribution if it spine related.  During the injection itself he reports not being able to feel the injection at all even though the dermatomal pattern of the skin and tissues in that region would not be the same as the lumbar level of the nerve roots involved there.  I am glad he did not seem to feel the shot at all is actually a nice finding hopefully will help him out.    ROS Otherwise per HPI.  Assessment & Plan: Visit Diagnoses:    ICD-10-CM   1. Lumbar radiculopathy  M54.16 XR C-ARM NO REPORT    Epidural Steroid injection    betamethasone acetate-betamethasone sodium phosphate (CELESTONE) injection 12 mg    Plan: No additional findings.   Meds & Orders:  Meds ordered this encounter  Medications  . betamethasone acetate-betamethasone sodium phosphate (CELESTONE) injection 12 mg    Orders Placed This Encounter  Procedures  . XR C-ARM NO REPORT  . Epidural Steroid injection    Follow-up: Return if symptoms worsen or fail to improve.   Procedures: No procedures performed  S1 Lumbosacral Transforaminal  Epidural Steroid Injection - Sub-Pedicular Approach with Fluoroscopic Guidance   Patient: Jermaine [name]      Date of Birth: 08/17/1981 MRN: [id number] PCP: [name], MD      Visit Date: 11/20/2020   Universal Protocol:    Date/Time: 03/02/222:17 PM  Consent Given By: the patient  Position:  PRONE  Additional Comments: Vital signs were monitored before and after the procedure. Patient was prepped and draped in the usual sterile fashion. The correct patient, procedure, and site was verified.   Injection Procedure Details:  Procedure Site One Meds Administered:  Meds ordered this encounter  Medications  . betamethasone acetate-betamethasone sodium phosphate (CELESTONE) injection 12 mg    Laterality: Left  Location/Site:  S1 Foramen   Needle size: 22 ga.  Needle type: Spinal  Needle Placement: Transforaminal  Findings:   -Comments: Excellent flow of contrast along the nerve, nerve root and into the epidural space.  Epidurogram: Contrast epidurogram showed no nerve root cut off or restricted flow pattern.  Procedure Details: After squaring off the sacral end-plate to get a true AP view, the C-arm was positioned so that the best possible view of the S1 foramen was visualized. The soft tissues overlying this structure were infiltrated with 2-3 ml. of 1% Lidocaine without Epinephrine.    The spinal needle was inserted toward the target using a "trajectory" view along the fluoroscope beam.  Under AP and lateral visualization, the needle was advanced so it did not  puncture dura. Biplanar projections were used to confirm position. Aspiration was confirmed to be negative for CSF and/or blood. A 1-2 ml. volume of Isovue-250 was injected and flow of contrast was noted at each level. Radiographs were obtained for documentation purposes.   After attaining the desired flow of contrast documented above, a 0.5 to 1.0 ml test dose of 0.25% Marcaine was injected into each respective  transforaminal space.  The patient was observed for 90 seconds post injection.  After no sensory deficits were reported, and normal lower extremity motor function was noted,   the above injectate was administered so that equal amounts of the injectate were placed at each foramen (level) into the transforaminal epidural space.   Additional Comments:  The patient tolerated the procedure well Dressing: Band-Aid with 2 x 2 sterile gauze    Post-procedure details: Patient was observed during the procedure. Post-procedure instructions were reviewed.  Patient left the clinic in stable condition.     Clinical History: MRI LUMBAR SPINE WITHOUT CONTRAST  TECHNIQUE: Multiplanar, multisequence MR imaging of the lumbar spine was performed. No intravenous contrast was administered.  COMPARISON:  07/16/2020 and prior.  FINDINGS: Please note that image quality is degraded by motion artifact.  Segmentation: The lowest well-formed intervertebral disc space is assumed to be the L5-S1 level.  Alignment:  Normal.  Vertebrae: Vertebral body heights are preserved. Normal bone marrow signal intensity. No aggressive osseous lesion.  Conus medullaris and cauda equina: Conus extends to the L1 level. Conus and cauda equina appear normal.  Disc levels: Multilevel desiccation and Schmorl's node formation.  L1-2: Minimal disc bulge with left foraminal annular fissuring. Patent spinal canal and neural foramen.  L2-3: Minimal disc bulge with shallow left foraminal protrusion/annular fissuring. Patent spinal canal and neural foramen.  L3-4: Disc bulge and facet degenerative spurring. Patent spinal canal. Mild bilateral neural foraminal narrowing.  L4-5: Disc bulge with superimposed central protrusion/annular fissuring and bilateral facet degenerative spurring. Patent spinal canal. Mild right and moderate left neural foraminal narrowing.  L5-S1: Disc bulge with superimposed left  subarticular protrusion/annular fissuring grazing the descending S1 nerve root and effacing the lateral recess. Patent spinal canal and right neural foramen. Mild left neural foraminal narrowing.  Paraspinal and other soft tissues: Negative.  IMPRESSION: Central L4-5 protrusion/annular fissuring with moderate left and mild right neural foraminal narrowing.  Left L5-S1 subarticular protrusion/annular fissuring abutting the descending S1 nerve root with mild left neural foraminal narrowing.  Mild bilateral L3-4 neural foraminal narrowing.   Electronically Signed   By: Primitivo Gauze M.D.   On: 10/29/2020 16:58     Objective:  VS:  HT:    WT:   BMI:     BP:   HR: bpm  TEMP: ( )  RESP:  Physical Exam Vitals and nursing note reviewed.  Constitutional:      General: He is not in acute distress.    Appearance: Normal appearance. He is not ill-appearing.  HENT:     Head: Normocephalic and atraumatic.     Right Ear: External ear normal.     Left Ear: External ear normal.     Nose: No congestion.  Eyes:     Extraocular Movements: Extraocular movements intact.  Cardiovascular:     Rate and Rhythm: Normal rate.     Pulses: Normal pulses.  Pulmonary:     Effort: Pulmonary effort is normal. No respiratory distress.  Abdominal:     General: There is no distension.     Palpations: Abdomen  is soft.  Musculoskeletal:        General: Tenderness present. No signs of injury.     Cervical back: Neck supple.     Right lower leg: No edema.     Left lower leg: No edema.     Comments: Patient has good distal strength without clonus.  Skin:    Findings: No erythema or rash.  Neurological:     General: No focal deficit present.     Mental Status: He is alert and oriented to person, place, and time.     Sensory: Sensory deficit present.     Motor: No weakness or abnormal muscle tone.     Coordination: Coordination normal.     Gait: Gait abnormal.  Psychiatric:         Mood and Affect: Mood normal.        Behavior: Behavior normal.      Imaging: XR C-ARM NO REPORT  Result Date: 11/20/2020 Please see Notes tab for imaging impression.

## 2020-11-20 NOTE — Procedures (Signed)
S1 Lumbosacral Transforaminal Epidural Steroid Injection - Sub-Pedicular Approach with Fluoroscopic Guidance   Patient: Jermaine [name]      Date of Birth: 06-29-1981 MRN: [id number] PCP: [name], MD      Visit Date: 11/20/2020   Universal Protocol:    Date/Time: 03/02/222:17 PM  Consent Given By: the patient  Position:  PRONE  Additional Comments: Vital signs were monitored before and after the procedure. Patient was prepped and draped in the usual sterile fashion. The correct patient, procedure, and site was verified.   Injection Procedure Details:  Procedure Site One Meds Administered:  Meds ordered this encounter  Medications  . betamethasone acetate-betamethasone sodium phosphate (CELESTONE) injection 12 mg    Laterality: Left  Location/Site:  S1 Foramen   Needle size: 22 ga.  Needle type: Spinal  Needle Placement: Transforaminal  Findings:   -Comments: Excellent flow of contrast along the nerve, nerve root and into the epidural space.  Epidurogram: Contrast epidurogram showed no nerve root cut off or restricted flow pattern.  Procedure Details: After squaring off the sacral end-plate to get a true AP view, the C-arm was positioned so that the best possible view of the S1 foramen was visualized. The soft tissues overlying this structure were infiltrated with 2-3 ml. of 1% Lidocaine without Epinephrine.    The spinal needle was inserted toward the target using a "trajectory" view along the fluoroscope beam.  Under AP and lateral visualization, the needle was advanced so it did not puncture dura. Biplanar projections were used to confirm position. Aspiration was confirmed to be negative for CSF and/or blood. A 1-2 ml. volume of Isovue-250 was injected and flow of contrast was noted at each level. Radiographs were obtained for documentation purposes.   After attaining the desired flow of contrast documented above, a 0.5 to 1.0 ml test dose of 0.25% Marcaine was  injected into each respective transforaminal space.  The patient was observed for 90 seconds post injection.  After no sensory deficits were reported, and normal lower extremity motor function was noted,   the above injectate was administered so that equal amounts of the injectate were placed at each foramen (level) into the transforaminal epidural space.   Additional Comments:  The patient tolerated the procedure well Dressing: Band-Aid with 2 x 2 sterile gauze    Post-procedure details: Patient was observed during the procedure. Post-procedure instructions were reviewed.  Patient left the clinic in stable condition.

## 2020-12-05 ENCOUNTER — Ambulatory Visit: Payer: Medicaid Other | Admitting: Orthopedic Surgery

## 2020-12-11 ENCOUNTER — Other Ambulatory Visit: Payer: Self-pay

## 2020-12-11 ENCOUNTER — Ambulatory Visit (HOSPITAL_COMMUNITY)
Admission: RE | Admit: 2020-12-11 | Discharge: 2020-12-11 | Disposition: A | Discharge status: Home / Self Care | Payer: Medicaid Other | Attending: Psychiatry | Admitting: Psychiatry

## 2020-12-11 ENCOUNTER — Encounter (HOSPITAL_COMMUNITY): Payer: Self-pay

## 2020-12-11 ENCOUNTER — Emergency Department (HOSPITAL_COMMUNITY)
Admission: EM | Admit: 2020-12-11 | Discharge: 2020-12-14 | Disposition: A | Discharge status: Home / Self Care | Payer: Medicaid Other | Attending: Emergency Medicine | Admitting: Emergency Medicine

## 2020-12-11 ENCOUNTER — Emergency Department (HOSPITAL_COMMUNITY): Payer: Medicaid Other

## 2020-12-11 DIAGNOSIS — J45909 Unspecified asthma, uncomplicated: Secondary | ICD-10-CM | POA: Insufficient documentation

## 2020-12-11 DIAGNOSIS — F603 Borderline personality disorder: Secondary | ICD-10-CM | POA: Insufficient documentation

## 2020-12-11 DIAGNOSIS — Z20822 Contact with and (suspected) exposure to covid-19: Secondary | ICD-10-CM | POA: Diagnosis not present

## 2020-12-11 DIAGNOSIS — F1910 Other psychoactive substance abuse, uncomplicated: Secondary | ICD-10-CM

## 2020-12-11 DIAGNOSIS — F1994 Other psychoactive substance use, unspecified with psychoactive substance-induced mood disorder: Secondary | ICD-10-CM | POA: Diagnosis present

## 2020-12-11 DIAGNOSIS — F3130 Bipolar disorder, current episode depressed, mild or moderate severity, unspecified: Secondary | ICD-10-CM | POA: Diagnosis not present

## 2020-12-11 DIAGNOSIS — F3160 Bipolar disorder, current episode mixed, unspecified: Secondary | ICD-10-CM | POA: Diagnosis present

## 2020-12-11 DIAGNOSIS — F1924 Other psychoactive substance dependence with psychoactive substance-induced mood disorder: Secondary | ICD-10-CM | POA: Diagnosis not present

## 2020-12-11 DIAGNOSIS — I2510 Atherosclerotic heart disease of native coronary artery without angina pectoris: Secondary | ICD-10-CM | POA: Diagnosis not present

## 2020-12-11 DIAGNOSIS — F32A Depression, unspecified: Secondary | ICD-10-CM | POA: Diagnosis present

## 2020-12-11 DIAGNOSIS — I10 Essential (primary) hypertension: Secondary | ICD-10-CM | POA: Diagnosis not present

## 2020-12-11 DIAGNOSIS — F17210 Nicotine dependence, cigarettes, uncomplicated: Secondary | ICD-10-CM | POA: Insufficient documentation

## 2020-12-11 DIAGNOSIS — R569 Unspecified convulsions: Secondary | ICD-10-CM | POA: Insufficient documentation

## 2020-12-11 DIAGNOSIS — Z79899 Other long term (current) drug therapy: Secondary | ICD-10-CM | POA: Diagnosis not present

## 2020-12-11 DIAGNOSIS — E119 Type 2 diabetes mellitus without complications: Secondary | ICD-10-CM | POA: Diagnosis not present

## 2020-12-11 DIAGNOSIS — R45851 Suicidal ideations: Secondary | ICD-10-CM

## 2020-12-11 DIAGNOSIS — F319 Bipolar disorder, unspecified: Secondary | ICD-10-CM | POA: Diagnosis present

## 2020-12-11 LAB — COMPREHENSIVE METABOLIC PANEL
ALT: 22 U/L (ref 0–44)
AST: 24 U/L (ref 15–41)
Albumin: 4.3 g/dL (ref 3.5–5.0)
Alkaline Phosphatase: 72 U/L (ref 38–126)
Anion gap: 11 (ref 5–15)
BUN: 20 mg/dL (ref 6–20)
CO2: 27 mmol/L (ref 22–32)
Calcium: 8.8 mg/dL — ABNORMAL LOW (ref 8.9–10.3)
Chloride: 97 mmol/L — ABNORMAL LOW (ref 98–111)
Creatinine, Ser: 1.17 mg/dL (ref 0.61–1.24)
GFR, Estimated: >60 mL/min (ref >60)
Glucose, Bld: 90 mg/dL (ref 70–99)
Potassium: 3.6 mmol/L (ref 3.5–5.1)
Sodium: 135 mmol/L (ref 135–145)
Total Bilirubin: 0.5 mg/dL (ref 0.3–1.2)
Total Protein: 7.1 g/dL (ref 6.5–8.1)

## 2020-12-11 LAB — ACETAMINOPHEN LEVEL: Acetaminophen (Tylenol), Serum: <10 ug/mL — ABNORMAL LOW (ref 10–30)

## 2020-12-11 LAB — RAPID URINE DRUG SCREEN, HOSP PERFORMED
Amphetamines: POSITIVE — AB
Barbiturates: NOT DETECTED
Benzodiazepines: POSITIVE — AB
Cocaine: NOT DETECTED
Opiates: NOT DETECTED
Tetrahydrocannabinol: POSITIVE — AB

## 2020-12-11 LAB — CBC
HCT: 44.1 % (ref 39.0–52.0)
Hemoglobin: 14.9 g/dL (ref 13.0–17.0)
MCH: 31.4 pg (ref 26.0–34.0)
MCHC: 33.8 g/dL (ref 30.0–36.0)
MCV: 93 fL (ref 80.0–100.0)
Platelets: 194 10*3/uL (ref 150–400)
RBC: 4.74 MIL/uL (ref 4.22–5.81)
RDW: 12.9 % (ref 11.5–15.5)
WBC: 7.2 10*3/uL (ref 4.0–10.5)
nRBC: 0 % (ref 0.0–0.2)

## 2020-12-11 LAB — SALICYLATE LEVEL: Salicylate Lvl: <7 mg/dL — ABNORMAL LOW (ref 7.0–30.0)

## 2020-12-11 LAB — ETHANOL: Alcohol, Ethyl (B): <10 mg/dL (ref <10)

## 2020-12-11 LAB — RESP PANEL BY RT-PCR (FLU A&B, COVID) ARPGX2
Influenza A by PCR: NEGATIVE
Influenza B by PCR: NEGATIVE
SARS Coronavirus 2 by RT PCR: NEGATIVE

## 2020-12-11 LAB — LITHIUM LEVEL: Lithium Lvl: 0.16 mmol/L — ABNORMAL LOW (ref 0.60–1.20)

## 2020-12-11 MED ORDER — BENZTROPINE MESYLATE 1 MG PO TABS
1.0000 mg | ORAL_TABLET | Freq: Every day | ORAL | Status: DC
  Administered 2020-12-11 – 2020-12-13 (×3): 1 mg via ORAL
  Filled 2020-12-11 (×3): qty 1

## 2020-12-11 MED ORDER — HYDROXYZINE PAMOATE 25 MG PO CAPS: 25.0000 mg | ORAL_CAPSULE | Freq: Four times a day (QID) | ORAL | Status: DC | PRN

## 2020-12-11 MED ORDER — LEVOTHYROXINE SODIUM 50 MCG PO TABS
50.0000 ug | ORAL_TABLET | Freq: Every day | ORAL | Status: DC
  Administered 2020-12-12 – 2020-12-14 (×3): 50 ug via ORAL
  Filled 2020-12-11 (×3): qty 1

## 2020-12-11 MED ORDER — NICOTINE 21 MG/24HR TD PT24
21.0000 mg | MEDICATED_PATCH | Freq: Every day | TRANSDERMAL | Status: DC
  Administered 2020-12-11 – 2020-12-14 (×4): 21 mg via TRANSDERMAL
  Filled 2020-12-11 (×3): qty 1

## 2020-12-11 MED ORDER — HYDROXYZINE HCL 25 MG PO TABS
25.0000 mg | ORAL_TABLET | Freq: Four times a day (QID) | ORAL | Status: DC | PRN
  Administered 2020-12-11 – 2020-12-14 (×3): 25 mg via ORAL
  Filled 2020-12-11 (×3): qty 1

## 2020-12-11 MED ORDER — GABAPENTIN 300 MG PO CAPS
600.0000 mg | ORAL_CAPSULE | Freq: Three times a day (TID) | ORAL | Status: DC
  Administered 2020-12-11 – 2020-12-14 (×8): 600 mg via ORAL
  Filled 2020-12-11 (×8): qty 2

## 2020-12-11 MED ORDER — CYCLOBENZAPRINE HCL 10 MG PO TABS
10.0000 mg | ORAL_TABLET | Freq: Three times a day (TID) | ORAL | Status: DC | PRN
  Administered 2020-12-11 – 2020-12-14 (×4): 10 mg via ORAL
  Filled 2020-12-11 (×4): qty 1

## 2020-12-11 MED ORDER — LAMOTRIGINE 100 MG PO TABS
200.0000 mg | ORAL_TABLET | Freq: Every day | ORAL | Status: DC
  Administered 2020-12-12 – 2020-12-14 (×3): 200 mg via ORAL
  Filled 2020-12-11 (×3): qty 2

## 2020-12-11 MED ORDER — OXYCODONE-ACETAMINOPHEN 5-325 MG PO TABS
1.0000 | ORAL_TABLET | Freq: Three times a day (TID) | ORAL | Status: DC | PRN
  Administered 2020-12-11: 1 via ORAL
  Filled 2020-12-11: qty 1

## 2020-12-11 MED ORDER — ATENOLOL 50 MG PO TABS
50.0000 mg | ORAL_TABLET | Freq: Every day | ORAL | Status: DC
  Administered 2020-12-12 – 2020-12-14 (×3): 50 mg via ORAL
  Filled 2020-12-11 (×3): qty 1

## 2020-12-11 MED ORDER — OXYCODONE HCL 5 MG PO TABS
5.0000 mg | ORAL_TABLET | Freq: Three times a day (TID) | ORAL | Status: DC | PRN
Start: 2020-12-11 — End: 2020-12-12
  Administered 2020-12-11: 5 mg via ORAL
  Filled 2020-12-11: qty 1

## 2020-12-11 MED ORDER — PANTOPRAZOLE SODIUM 40 MG PO TBEC
40.0000 mg | DELAYED_RELEASE_TABLET | Freq: Every day | ORAL | Status: DC
  Administered 2020-12-11 – 2020-12-14 (×4): 40 mg via ORAL
  Filled 2020-12-11 (×4): qty 1

## 2020-12-11 MED ORDER — OXYCODONE-ACETAMINOPHEN 10-325 MG PO TABS: 1.0000 | ORAL_TABLET | Freq: Three times a day (TID) | ORAL | Status: DC | PRN
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
## 2020-12-11 NOTE — ED Triage Notes (Signed)
Pt states [facility] advised him to come here- pt c/o feeling very depressed and SI x 1 week- not taking any of his meds bc he's "too depressed". Pt has hx of seizures and has not been taking lamotrigine. Denies having any seizures
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
## 2020-12-11 NOTE — H&P (Signed)
Behavioral Health Medical Screening Exam  Jermaine [name] is an 40 y.o. male who presented to [facility], voluntarily, as a walk-in accompanied by his girlfriend of 2 years. Patient has history of  diagnoses of  Bipolar 1 disorder, current episode depressed, borderline personality disorder, anxiety, depression, Seizure, HTN, and GERD He stopped taking his medications 1 week ago. He wrote a suicide note to his girlfriend last night. He is unable to state what his plan was. He has attempted suicide in the past. He stated he had a seizure last night and he takes lamotrigine for his seizures. He stated he wants help to get back on his medications, which he incidentally took last night to see they would make him feel better. His girlfriend stated "if he doesn't get help, I am leaving because his mood swings are to the point where I am afraid of him."   Total Time spent with patient: 30 minutes  Psychiatric Specialty Exam: Physical Exam Musculoskeletal:        General: Normal range of motion.     Cervical back: Normal range of motion.  Neurological:     Mental Status: He is alert and oriented to person, place, and time.  Psychiatric:        Attention and Perception: He perceives auditory hallucinations.        Mood and Affect: Mood is depressed.        Behavior: Behavior is slowed and withdrawn.        Thought Content: Thought content does not include suicidal ideation. Thought content does not include suicidal plan.    Review of Systems  Constitutional: Negative.   HENT: Negative for rhinorrhea, sneezing and sore throat.   Respiratory: Negative.  Negative for shortness of breath.   Gastrointestinal: Negative.   Genitourinary: Negative.   Musculoskeletal: Negative.   Neurological: Negative.    There were no vitals taken for this visit.There is no height or weight on file to calculate BMI. General Appearance: Disheveled Eye Contact:  Poor Speech:  Slow Volume:  Decreased Mood:  Anxious, Depressed,  Dysphoric and Irritable Affect:  Congruent, Depressed and Flat Thought Process:  Coherent and Descriptions of Associations: Intact Orientation:  Full (Time, Place, and Person) Thought Content:  Logical and Hallucinations: Auditory Suicidal Thoughts:  Yes.  with intent/plan Homicidal Thoughts:  No Memory:  Immediate;   Fair Recent;   Fair Remote;   Fair Judgement:  Poor Insight:  Lacking Psychomotor Activity:  Decreased Concentration: Concentration: Fair and Attention Span: Fair Recall:  Poor Fund of Knowledge:Fair Language: Good Akathisia:  No Handed:  Right AIMS (if indicated):    Assets:  Desire for Improvement Financial Resources/Insurance Housing Resilience Social Support Sleep:     Musculoskeletal: Strength & Muscle Tone: within normal limits Gait & Station: normal Patient leans: N/A  There were no vitals taken for this visit.  Recommendations: Based on my evaluation the patient does not appear to have an emergency medical condition. patient will be transported to [facility] for medical clearance.   [name], NP 12/11/2020, 5:58 PM

## 2020-12-11 NOTE — ED Triage Notes (Signed)
Pt clarified that he's had 3 seizures in the past 2 days

## 2020-12-11 NOTE — ED Notes (Signed)
Pt has a black bag and one pt belonging bag under cabinet in triage

## 2020-12-12 MED ORDER — OXYCODONE-ACETAMINOPHEN 5-325 MG PO TABS
1.0000 | ORAL_TABLET | Freq: Four times a day (QID) | ORAL | Status: DC | PRN
  Administered 2020-12-12 – 2020-12-13 (×5): 1 via ORAL
  Filled 2020-12-12 (×5): qty 1

## 2020-12-12 MED ORDER — OXYCODONE HCL 5 MG PO TABS
5.0000 mg | ORAL_TABLET | Freq: Four times a day (QID) | ORAL | Status: DC | PRN
  Administered 2020-12-12 – 2020-12-13 (×5): 5 mg via ORAL
  Filled 2020-12-12 (×5): qty 1
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
## 2020-12-12 NOTE — ED Provider Notes (Signed)
Emergency Medicine Observation Re-evaluation Note  Jermaine [name] is a 40 y.o. male, seen on rounds today.  Pt initially presented to the ED for complaints of Suicidal Currently, the patient is awaiting TTS evaluation.  Physical Exam  BP 135/76   Pulse 81   Temp 97.7 F (36.5 C)   Resp 16   Ht 6\' 2"  (1.88 m)   Wt 95.3 kg   SpO2 99%   BMI 26.96 kg/m  Physical Exam General: Nontoxic Cardiac: Normal heart rate Lungs: Normal respiratory Psych: Calm  ED Course / MDM  EKG:   I have reviewed the labs performed to date as well as medications administered while in observation.  Recent changes in the last 24 hours include stable awaiting psychiatric evaluation.  No seizures here.  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  Plan  Current plan is for psychiatric evaluation. Patient is not under full IVC at this time.   Daleen Bo, MD 12/12/20 [phone]

## 2020-12-13 LAB — LAMOTRIGINE LEVEL: Lamotrigine Lvl: 1.3 ug/mL — ABNORMAL LOW (ref 2.0–20.0)

## 2020-12-13 MED ORDER — OXYCODONE-ACETAMINOPHEN 5-325 MG PO TABS
1.0000 | ORAL_TABLET | Freq: Four times a day (QID) | ORAL | Status: DC | PRN
  Administered 2020-12-13 – 2020-12-14 (×4): 1 via ORAL
  Filled 2020-12-13 (×4): qty 1

## 2020-12-13 MED ORDER — LITHIUM CARBONATE 300 MG PO CAPS
600.0000 mg | ORAL_CAPSULE | Freq: Every day | ORAL | Status: DC
  Administered 2020-12-13: 600 mg via ORAL
  Filled 2020-12-13: qty 2

## 2020-12-13 MED ORDER — BUSPIRONE HCL 10 MG PO TABS
10.0000 mg | ORAL_TABLET | Freq: Two times a day (BID) | ORAL | Status: DC
  Administered 2020-12-13 – 2020-12-14 (×3): 10 mg via ORAL
  Filled 2020-12-13 (×3): qty 1

## 2020-12-13 MED ORDER — LITHIUM CARBONATE 300 MG PO CAPS
300.0000 mg | ORAL_CAPSULE | Freq: Every day | ORAL | Status: DC
  Administered 2020-12-13 – 2020-12-14 (×2): 300 mg via ORAL
  Filled 2020-12-13 (×2): qty 1

## 2020-12-13 NOTE — BH Assessment (Signed)
[place] Assessment Progress Note  Per Waylan Boga, DNP this voluntary pt is to be held for observation and to be started on medications.  This Probation officer has reached out to Ernie Hew, MD at [facility] to ask about transferring him there for observation.  At 11:19 she replies that due to recent seizure (12/10/2020) pt is not suitable for that setting.  Pt will remain at [facility].  EDP Milton Ferguson, MD, charge nurse Marzetta Board, and pt's nurse Adela Lank have been notified.  Jalene Mullet, [place] Coordinator [phone]

## 2020-12-13 NOTE — Consult Note (Signed)
Client denies homicidal ideations, hallucinations, and withdrawal symptoms.  Passive suicidal ideations, agreeable to continue to medications restarting to balance his mood.  Medications adjusted based on his home medications and client's report of medications that help him.  He will be re-evaluated tomorrow for stability in the hope to discharge him home to follow up with outpatient (if stable mood).  Client agreeable to the plan.  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  Will continue to monitor.  Waylan Boga, PMHNP

## 2020-12-13 NOTE — ED Provider Notes (Signed)
Patient seen by behavioral health.  He needs placement suicidal ideations but cannot [facility] because he had a seizure on March 22.   Milton Ferguson, MD 12/13/20 [phone]

## 2020-12-13 NOTE — ED Notes (Signed)
Provided pt with caffeine free coke.

## 2020-12-14 ENCOUNTER — Other Ambulatory Visit: Payer: Self-pay | Admitting: Psychiatry

## 2020-12-14 DIAGNOSIS — F1994 Other psychoactive substance use, unspecified with psychoactive substance-induced mood disorder: Secondary | ICD-10-CM | POA: Diagnosis present

## 2020-12-14 DIAGNOSIS — F1910 Other psychoactive substance abuse, uncomplicated: Secondary | ICD-10-CM

## 2020-12-14 MED ORDER — LITHIUM CARBONATE 300 MG PO CAPS: ORAL_CAPSULE | ORAL | 0 refills | Status: DC

## 2020-12-14 MED ORDER — LAMOTRIGINE 200 MG PO TABS: 200.0000 mg | ORAL_TABLET | Freq: Every day | ORAL | 0 refills | Status: DC
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
## 2020-12-14 NOTE — ED Provider Notes (Addendum)
Emergency Medicine Observation Re-evaluation Note  Jermaine [name] is a 40 y.o. male, seen on rounds today.  Pt initially presented to the ED for complaints of Suicidal Currently, the patient is in the shower  Physical Exam  BP 133/87 (BP Location: Left Arm)   Pulse 66   Temp 98.4 F (36.9 C) (Oral)   Resp 20   Ht 6\' 2"  (1.88 m)   Wt 95.3 kg   SpO2 100%   BMI 26.96 kg/m  Physical Exam  ED Course / MDM  EKG:   I have reviewed the labs performed to date as well as medications administered while in observation.  Recent changes in the last 24 hours include none.  Plan  Current plan is for reassessment this AM for dispo. Patient is not under full IVC at this time.   10:51 AM Patient reassessed by Psych and cleared for discharge with outpatient Psych follow up at [facility].    Truddie Hidden, MD 12/14/20 1051

## 2021-01-16 ENCOUNTER — Other Ambulatory Visit: Payer: Self-pay | Admitting: Urology

## 2021-03-05 ENCOUNTER — Encounter: Payer: Self-pay | Admitting: Physician Assistant

## 2021-03-05 ENCOUNTER — Ambulatory Visit (INDEPENDENT_AMBULATORY_CARE_PROVIDER_SITE_OTHER): Payer: Medicaid Other | Admitting: Physician Assistant

## 2021-03-05 DIAGNOSIS — M5416 Radiculopathy, lumbar region: Secondary | ICD-10-CM
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
## 2021-03-18 ENCOUNTER — Ambulatory Visit: Payer: Medicaid Other | Admitting: Orthopaedic Surgery

## 2021-04-07 ENCOUNTER — Emergency Department (HOSPITAL_COMMUNITY)
Admission: EM | Admit: 2021-04-07 | Discharge: 2021-04-07 | Disposition: A | Discharge status: Home / Self Care | Payer: Medicaid Other | Attending: Emergency Medicine | Admitting: Emergency Medicine

## 2021-04-07 ENCOUNTER — Encounter (HOSPITAL_COMMUNITY): Payer: Self-pay

## 2021-04-07 ENCOUNTER — Other Ambulatory Visit: Payer: Self-pay

## 2021-04-07 DIAGNOSIS — F17210 Nicotine dependence, cigarettes, uncomplicated: Secondary | ICD-10-CM | POA: Diagnosis not present

## 2021-04-07 DIAGNOSIS — W275XXA Contact with paper-cutter, initial encounter: Secondary | ICD-10-CM | POA: Insufficient documentation

## 2021-04-07 DIAGNOSIS — S61210A Laceration without foreign body of right index finger without damage to nail, initial encounter: Secondary | ICD-10-CM

## 2021-04-07 DIAGNOSIS — Z79899 Other long term (current) drug therapy: Secondary | ICD-10-CM | POA: Insufficient documentation

## 2021-04-07 DIAGNOSIS — I10 Essential (primary) hypertension: Secondary | ICD-10-CM | POA: Diagnosis not present

## 2021-04-07 DIAGNOSIS — E119 Type 2 diabetes mellitus without complications: Secondary | ICD-10-CM | POA: Insufficient documentation

## 2021-04-07 DIAGNOSIS — I2510 Atherosclerotic heart disease of native coronary artery without angina pectoris: Secondary | ICD-10-CM | POA: Diagnosis not present

## 2021-04-07 DIAGNOSIS — J45909 Unspecified asthma, uncomplicated: Secondary | ICD-10-CM | POA: Insufficient documentation

## 2021-04-07 MED ORDER — LIDOCAINE HCL (PF) 1 % IJ SOLN
5.0000 mL | Freq: Once | INTRAMUSCULAR | Status: AC
  Administered 2021-04-07: 5 mL
  Filled 2021-04-07: qty 30
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
## 2021-04-07 NOTE — Discharge Instructions (Signed)
Keep wound clean and dry. Recheck with your doctor for wound check in 2 days, suture removal in 10 days.
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
## 2021-04-25 ENCOUNTER — Ambulatory Visit (INDEPENDENT_AMBULATORY_CARE_PROVIDER_SITE_OTHER): Payer: Medicaid Other | Admitting: Orthopaedic Surgery

## 2021-04-25 ENCOUNTER — Other Ambulatory Visit: Payer: Self-pay

## 2021-04-25 DIAGNOSIS — M5126 Other intervertebral disc displacement, lumbar region: Secondary | ICD-10-CM
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
## 2022-11-18 ENCOUNTER — Emergency Department (HOSPITAL_COMMUNITY)

## 2022-11-18 ENCOUNTER — Other Ambulatory Visit: Payer: Self-pay

## 2022-11-18 ENCOUNTER — Emergency Department (HOSPITAL_COMMUNITY)
Admission: EM | Admit: 2022-11-18 | Discharge: 2022-11-18 | Attending: Emergency Medicine | Admitting: Emergency Medicine

## 2022-11-18 ENCOUNTER — Encounter (HOSPITAL_COMMUNITY): Payer: Self-pay

## 2022-11-18 DIAGNOSIS — M542 Cervicalgia: Secondary | ICD-10-CM | POA: Insufficient documentation

## 2022-11-18 DIAGNOSIS — E119 Type 2 diabetes mellitus without complications: Secondary | ICD-10-CM | POA: Diagnosis not present

## 2022-11-18 DIAGNOSIS — I2510 Atherosclerotic heart disease of native coronary artery without angina pectoris: Secondary | ICD-10-CM | POA: Diagnosis not present

## 2022-11-18 DIAGNOSIS — R519 Headache, unspecified: Secondary | ICD-10-CM | POA: Insufficient documentation

## 2022-11-18 DIAGNOSIS — M546 Pain in thoracic spine: Secondary | ICD-10-CM | POA: Insufficient documentation

## 2022-11-18 DIAGNOSIS — M25572 Pain in left ankle and joints of left foot: Secondary | ICD-10-CM | POA: Insufficient documentation

## 2022-11-18 DIAGNOSIS — J45909 Unspecified asthma, uncomplicated: Secondary | ICD-10-CM | POA: Diagnosis not present

## 2022-11-18 DIAGNOSIS — W19XXXA Unspecified fall, initial encounter: Secondary | ICD-10-CM

## 2022-11-18 DIAGNOSIS — Z79899 Other long term (current) drug therapy: Secondary | ICD-10-CM | POA: Insufficient documentation

## 2022-11-18 DIAGNOSIS — M545 Low back pain, unspecified: Secondary | ICD-10-CM | POA: Insufficient documentation

## 2022-11-18 DIAGNOSIS — W109XXA Fall (on) (from) unspecified stairs and steps, initial encounter: Secondary | ICD-10-CM | POA: Insufficient documentation

## 2022-11-18 LAB — TROPONIN I (HIGH SENSITIVITY): Troponin I (High Sensitivity): 7 ng/L (ref <18)

## 2022-11-18 LAB — CBG MONITORING, ED: Glucose-Capillary: 100 mg/dL — ABNORMAL HIGH (ref 70–99)

## 2022-11-18 LAB — CBC WITH DIFFERENTIAL/PLATELET
Abs Immature Granulocytes: 0.02 10*3/uL (ref 0.00–0.07)
Basophils Absolute: 0 10*3/uL (ref 0.0–0.1)
Basophils Relative: 0 %
Eosinophils Absolute: 0.1 10*3/uL (ref 0.0–0.5)
Eosinophils Relative: 2 %
HCT: 44.5 % (ref 39.0–52.0)
Hemoglobin: 15.9 g/dL (ref 13.0–17.0)
Immature Granulocytes: 0 %
Lymphocytes Relative: 27 %
Lymphs Abs: 2.4 10*3/uL (ref 0.7–4.0)
MCH: 33.1 pg (ref 26.0–34.0)
MCHC: 35.7 g/dL (ref 30.0–36.0)
MCV: 92.5 fL (ref 80.0–100.0)
Monocytes Absolute: 0.6 10*3/uL (ref 0.1–1.0)
Monocytes Relative: 7 %
Neutro Abs: 5.7 10*3/uL (ref 1.7–7.7)
Neutrophils Relative %: 64 %
Platelets: 216 10*3/uL (ref 150–400)
RBC: 4.81 MIL/uL (ref 4.22–5.81)
RDW: 11.9 % (ref 11.5–15.5)
WBC: 8.9 10*3/uL (ref 4.0–10.5)
nRBC: 0 % (ref 0.0–0.2)

## 2022-11-18 LAB — COMPREHENSIVE METABOLIC PANEL
ALT: 25 U/L (ref 0–44)
AST: 25 U/L (ref 15–41)
Albumin: 4.1 g/dL (ref 3.5–5.0)
Alkaline Phosphatase: 62 U/L (ref 38–126)
Anion gap: 5 (ref 5–15)
BUN: 15 mg/dL (ref 6–20)
CO2: 27 mmol/L (ref 22–32)
Calcium: 8.9 mg/dL (ref 8.9–10.3)
Chloride: 102 mmol/L (ref 98–111)
Creatinine, Ser: 1.06 mg/dL (ref 0.61–1.24)
GFR, Estimated: >60 mL/min (ref >60)
Glucose, Bld: 100 mg/dL — ABNORMAL HIGH (ref 70–99)
Potassium: 3.6 mmol/L (ref 3.5–5.1)
Sodium: 134 mmol/L — ABNORMAL LOW (ref 135–145)
Total Bilirubin: 0.4 mg/dL (ref 0.3–1.2)
Total Protein: 7.3 g/dL (ref 6.5–8.1)

## 2022-11-18 LAB — LACTIC ACID, PLASMA: Lactic Acid, Venous: 0.8 mmol/L (ref 0.5–1.9)

## 2022-11-18 MED ORDER — OXYCODONE-ACETAMINOPHEN 5-325 MG PO TABS
1.0000 | ORAL_TABLET | Freq: Once | ORAL | Status: AC
  Administered 2022-11-18: 1 via ORAL
  Filled 2022-11-18: qty 1

## 2022-11-18 MED ORDER — KETOROLAC TROMETHAMINE 15 MG/ML IJ SOLN
15.0000 mg | Freq: Once | INTRAMUSCULAR | Status: AC
  Administered 2022-11-18: 15 mg via INTRAVENOUS
  Filled 2022-11-18: qty 1

## 2022-11-18 NOTE — ED Triage Notes (Signed)
Pt fell down 3 steps around 5:30. Hx of seizures but does not take anything for them. Hurt his back, rt shoulder, and left ankle. Right shoulder was injured last week and this is a re injury  Alert and oriented x 4.

## 2022-11-18 NOTE — ED Notes (Signed)
Patient transported to X-ray 

## 2022-11-18 NOTE — Discharge Instructions (Signed)
You were seen in the emergency department today after falling down a set of stairs.  Your labs and imaging are all normal and reassuring.  Please return to the emergency department if you had recurrent episodes of passing out, chest pain, shortness of breath, severe headache with vision changes, vomiting or confusion or strokelike symptoms.

## 2022-11-18 NOTE — ED Provider Notes (Signed)
[facility] Provider Note   CSN: [id number] Arrival date & time: 11/18/22  1817     History  Chief Complaint  Patient presents with   Jermaine [name]    Jermaine [name] is a 42 y.o. male. With past medical history of asthma, bipolar, CAD, MI, seizures, diabetes, polysubstance abuse who presents to the emergency department with fall.  Patient states that he was coming down the steps today when he thinks he may have had a seizure and fell down the rest of the stairs. He states that he remembers being at the top of the stairs, feeling weird and then waking up at the bottom of the staircase with people around him. Prison staff present states that he probably fell down 12 stairs. Patient denies tongue biting or incontinence. He states that he has a history of seizures but has not been able to obtain seizure medication in months. He is endorsing headache, back pain, left ankle pain. He denies chest pain, palpitations, shortness of breath, abdominal pain.    Fall Associated symptoms include headaches.       Home Medications Prior to Admission medications   Medication Sig Start Date End Date Taking? Authorizing Provider  acetaminophen (TYLENOL) 325 MG tablet Take 650 mg by mouth every 6 (six) hours as needed for mild pain or headache.    [provider]  ALBUTEROL SULFATE HFA IN Inhale 2 puffs into the lungs every 4 (four) hours as needed (shortness of breath).    [provider]  atenolol (TENORMIN) 50 MG tablet Take 50 mg by mouth daily. 11/20/20   [provider]  benztropine (COGENTIN) 1 MG tablet Take 1 mg by mouth at bedtime. 09/11/19   [provider]  busPIRone (BUSPAR) 7.5 MG tablet Take 7.5 mg by mouth 3 (three) times daily. 09/11/19   [provider]  cloNIDine (CATAPRES) 0.1 MG tablet Take 1 tablet (0.1 mg total) by mouth 3 (three) times daily. Patient taking differently: Take 0.1-0.2 mg by mouth See  admin instructions. Take 0.1 mg in the morning and 0.2 mg at night 03/03/19   Connye Burkitt, NP  cyclobenzaprine (FLEXERIL) 10 MG tablet Take 10 mg by mouth every 8 (eight) hours as needed for muscle spasms. 07/01/20   [provider]  desvenlafaxine (PRISTIQ) 50 MG 24 hr tablet Take 50 mg by mouth daily. 09/28/19   [provider]  gabapentin (NEURONTIN) 600 MG tablet Take 600 mg by mouth 3 (three) times daily. 09/28/19   [provider]  hydrOXYzine (VISTARIL) 25 MG capsule Take 25-50 mg by mouth 3 (three) times daily. 11/06/19   [provider]  lamoTRIgine (LAMICTAL) 200 MG tablet Take 1 tablet (200 mg total) by mouth daily. 12/14/20   Truddie Hidden, MD  levothyroxine (SYNTHROID, LEVOTHROID) 50 MCG tablet Take 50 mcg by mouth daily before breakfast.     [provider]  lithium carbonate 300 MG capsule Take 1 tablet (300 mg) by mouth in the morning and 2 tablets (600 mg) at bedtime: mood stabilization 12/14/20   Truddie Hidden, MD  omeprazole (PRILOSEC) 20 MG capsule Take 1 capsule (20 mg total) by mouth daily. For acid reflux 09/02/18   Connye Burkitt, NP  oxyCODONE-acetaminophen (PERCOCET) 10-325 MG tablet Take 1 tablet by mouth every 4 (four) hours as needed for pain. 09/11/20   Persons, Bevely Palmer, PA  REXULTI 1 MG TABS tablet Take 1 mg by mouth at bedtime.  08/21/19   [provider]  simvastatin (ZOCOR) 20 MG tablet Take 20 mg by mouth daily.    [provider]  tadalafil (CIALIS) 20 MG tablet TAKE 1 TABLET BY MOUTH ONCE DAILY AS NEEDED FOR ERECTILE DYSFUNCTION 01/20/21   Cleon Gustin, MD      Allergies    Hydrocodone, Seroquel [quetiapine], and Trazodone and nefazodone    Review of Systems   Review of Systems  Musculoskeletal:  Positive for arthralgias, back pain and neck pain.  Neurological:  Positive for headaches.  All other systems reviewed and are negative.   Physical Exam Updated Vital Signs BP (!)  176/119   Pulse 76   Temp (!) 97.4 F (36.3 C) (Oral)   Resp 13   Ht '6\' 2"'$  (1.88 m)   Wt 96.2 kg   SpO2 100%   BMI 27.22 kg/m  Physical Exam Vitals and nursing note reviewed.  Constitutional:      General: He is not in acute distress.    Appearance: Normal appearance. He is not ill-appearing or toxic-appearing.  HENT:     Head: Normocephalic and atraumatic.     Mouth/Throat:     Mouth: Mucous membranes are moist.     Pharynx: Oropharynx is clear.  Eyes:     General: No scleral icterus.    Extraocular Movements: Extraocular movements intact.     Pupils: Pupils are equal, round, and reactive to light.  Cardiovascular:     Rate and Rhythm: Normal rate and regular rhythm.     Pulses: Normal pulses.     Heart sounds: No murmur heard. Pulmonary:     Effort: Pulmonary effort is normal. No respiratory distress.     Breath sounds: Normal breath sounds.  Abdominal:     General: Bowel sounds are normal.     Palpations: Abdomen is soft.     Tenderness: There is no abdominal tenderness.  Musculoskeletal:        General: Swelling and tenderness present.     Cervical back: Normal range of motion. Tenderness and bony tenderness present.     Thoracic back: Bony tenderness present.     Lumbar back: Bony tenderness present.     Comments: Diffuse spinal TTP. No obvious step offs or deformities. No swelling.   Left ankle TTP of the lateral malleolus. Mild swelling to the lateral ankle. No deformity. DP pulse palpable.   Skin:    General: Skin is warm and dry.     Capillary Refill: Capillary refill takes less than 2 seconds.  Neurological:     General: No focal deficit present.     Mental Status: He is alert and oriented to person, place, and time. Mental status is at baseline.  Psychiatric:        Mood and Affect: Mood normal.        Behavior: Behavior normal.        Thought Content: Thought content normal.        Judgment: Judgment normal.     ED Results / Procedures / Treatments    Labs (all labs ordered are listed, but only abnormal results are displayed) Labs Reviewed  COMPREHENSIVE METABOLIC PANEL - Abnormal; Notable for the following components:      Result Value   Sodium 134 (*)    Glucose, Bld 100 (*)    All other components within normal limits  CBG MONITORING, ED - Abnormal; Notable for the following components:   Glucose-Capillary 100 (*)    All  other components within normal limits  LACTIC ACID, PLASMA  CBC WITH DIFFERENTIAL/PLATELET  TROPONIN I (HIGH SENSITIVITY)    EKG None  Radiology DG Ankle Complete Left  Result Date: 11/18/2022 CLINICAL DATA:  Lateral left ankle pain after fall. EXAM: LEFT ANKLE COMPLETE - 3+ VIEW COMPARISON:  None Available. FINDINGS: There is no evidence of fracture or dislocation. Ankle mortise is preserved. Intact talar dome. Base of the fifth metatarsal is intact. There is no evidence of arthropathy or other focal bone abnormality. There is a small ankle joint effusion. IMPRESSION: Small joint effusion. No fracture or dislocation. Electronically Signed   By: Keith Rake M.D.   On: 11/18/2022 20:18   CT Thoracic Spine Wo Contrast  Result Date: 11/18/2022 CLINICAL DATA:  [place] down 3 steps with subsequent back pain EXAM: CT THORACIC AND LUMBAR SPINE WITHOUT CONTRAST TECHNIQUE: Multidetector CT imaging of the thoracic and lumbar spine was performed without contrast. Multiplanar CT image reconstructions were also generated. RADIATION DOSE REDUCTION: This exam was performed according to the departmental dose-optimization program which includes automated exposure control, adjustment of the mA and/or kV according to patient size and/or use of iterative reconstruction technique. COMPARISON:  MRI lumbar spine 10/29/2020 and MRI thoracolumbar spine 11/16/2018 FINDINGS: CT THORACIC SPINE FINDINGS Alignment: Normal. Vertebrae: No acute fracture or focal pathologic process. Paraspinal and other soft tissues: Negative. Disc levels: Disc  space height is maintained. Minimal multilevel spondylosis. CT LUMBAR SPINE FINDINGS Segmentation: 5 lumbar type vertebrae. Alignment: Normal. Vertebrae: No acute fracture or focal pathologic process. Paraspinal and other soft tissues: Negative. Disc levels: Mild spondylosis at L3-L4. Posterior disc osteophyte complex at L5-S1 causes mild effacement of the ventral thecal sac. No high-grade spinal canal narrowing. No significant neural foraminal narrowing. IMPRESSION: No acute fracture in the thoracolumbar spine. Electronically Signed   By: Placido Sou M.D.   On: 11/18/2022 20:01   CT Lumbar Spine Wo Contrast  Result Date: 11/18/2022 CLINICAL DATA:  [place] down 3 steps with subsequent back pain EXAM: CT THORACIC AND LUMBAR SPINE WITHOUT CONTRAST TECHNIQUE: Multidetector CT imaging of the thoracic and lumbar spine was performed without contrast. Multiplanar CT image reconstructions were also generated. RADIATION DOSE REDUCTION: This exam was performed according to the departmental dose-optimization program which includes automated exposure control, adjustment of the mA and/or kV according to patient size and/or use of iterative reconstruction technique. COMPARISON:  MRI lumbar spine 10/29/2020 and MRI thoracolumbar spine 11/16/2018 FINDINGS: CT THORACIC SPINE FINDINGS Alignment: Normal. Vertebrae: No acute fracture or focal pathologic process. Paraspinal and other soft tissues: Negative. Disc levels: Disc space height is maintained. Minimal multilevel spondylosis. CT LUMBAR SPINE FINDINGS Segmentation: 5 lumbar type vertebrae. Alignment: Normal. Vertebrae: No acute fracture or focal pathologic process. Paraspinal and other soft tissues: Negative. Disc levels: Mild spondylosis at L3-L4. Posterior disc osteophyte complex at L5-S1 causes mild effacement of the ventral thecal sac. No high-grade spinal canal narrowing. No significant neural foraminal narrowing. IMPRESSION: No acute fracture in the thoracolumbar  spine. Electronically Signed   By: Placido Sou M.D.   On: 11/18/2022 20:01   CT Cervical Spine Wo Contrast  Result Date: 11/18/2022 CLINICAL DATA:  Trauma EXAM: CT CERVICAL SPINE WITHOUT CONTRAST TECHNIQUE: Multidetector CT imaging of the cervical spine was performed without intravenous contrast. Multiplanar CT image reconstructions were also generated. RADIATION DOSE REDUCTION: This exam was performed according to the departmental dose-optimization program which includes automated exposure control, adjustment of the mA and/or kV according to patient size and/or use of iterative reconstruction  technique. COMPARISON:  None Available. FINDINGS: Alignment: Normal. Skull base and vertebrae: No acute fracture. No primary bone lesion or focal pathologic process. Soft tissues and spinal canal: No prevertebral fluid or swelling. No visible canal hematoma. Disc levels: No significant central canal or neural foraminal stenosis at any level. Upper chest: Negative. Other: None. IMPRESSION: No acute fracture or traumatic subluxation of the cervical spine. Electronically Signed   By: Ronney Asters M.D.   On: 11/18/2022 19:54   CT Head Wo Contrast  Result Date: 11/18/2022 CLINICAL DATA:  Status post fall. EXAM: CT HEAD WITHOUT CONTRAST TECHNIQUE: Contiguous axial images were obtained from the base of the skull through the vertex without intravenous contrast. RADIATION DOSE REDUCTION: This exam was performed according to the departmental dose-optimization program which includes automated exposure control, adjustment of the mA and/or kV according to patient size and/or use of iterative reconstruction technique. COMPARISON:  December 11, 2020 FINDINGS: Brain: No evidence of acute infarction, hemorrhage, hydrocephalus, extra-axial collection or mass lesion/mass effect. Vascular: No hyperdense vessel or unexpected calcification. Skull: Normal. Negative for fracture or focal lesion. Sinuses/Orbits: No acute finding. Other:  None. IMPRESSION: No acute intracranial pathology. Electronically Signed   By: Virgina Norfolk M.D.   On: 11/18/2022 19:53    Procedures Procedures   Medications Ordered in ED Medications  ketorolac (TORADOL) 15 MG/ML injection 15 mg (15 mg Intravenous Given 11/18/22 1949)  oxyCODONE-acetaminophen (PERCOCET/ROXICET) 5-325 MG per tablet 1 tablet (1 tablet Oral Given 11/18/22 2111)    ED Course/ Medical Decision Making/ A&P Clinical Course as of 11/18/22 2218  Wed Nov 18, 2022  2052 EKG not crossing into epic.  Normal sinus rhythm rate of 77 with nonspecific T waves inferiorly and V4 through 6. [MB]    Clinical Course User Index [MB] Hayden Rasmussen, MD    Medical Decision Making Amount and/or Complexity of Data Reviewed Labs: ordered. Radiology: ordered.  Risk Prescription drug management.  Initial Impression and Ddx 42 year old male who presents to the emergency department with fall. Patient PMH that increases complexity of ED encounter: Seizures, MI, diabetes, asthma, polysubstance abuse Differential: Mechanical fall, syncope, seizure, etc.  Interpretation of Diagnostics I independent reviewed and interpreted the labs as followed: CBC within normal limits, CMP within normal limits, lactic negative, troponin negative  - I independently visualized the following imaging with scope of interpretation limited to determining acute life threatening conditions related to emergency care: CT head, C-spine, T-spine, L-spine, left ankle, which revealed no acute findings  Patient Reassessment and Ultimate Disposition/Management Well-appearing 42 year old male who presents to the emergency department from correctional facility for a fall down about 12 stairs. This was a witnessed event however I do not have any report of seizure-like activity from the facility.  The patient does not remember however he reports no reported tongue biting or incontinence.  Given the fall down at least 12  stairs with loss of consciousness obtain CT head and C-spine.  Additionally is complaining of diffuse back pain so obtain a CT C, T, L-spine.  He also has swelling to his left ankle we obtained a plain film of this.  Obtain basic labs including a lactic which were all within normal limits.  Given the lack of elevated lactate, no reported seizure-like activity, tongue biting, incontinence I doubt that this patient had a seizure. EKG which showed some nonspecific T wave changes.  He has no chest pain or palpitations or shortness of breath.  Single troponin was negative.  Doubt ACS Orthostatic vitals  which were negative Not anemic or hypoglycemic  Unclear etiology of patient's fall.  It is unclear if he syncopized or not.  He has been evaluated here for about 4 hours with no recurring events.  His workup here has been very reassuring and feel that he is low risk at this time.  He was given pain control.  Feel that he can be discharged back to correctional facility.  Given return precautions for repeated episode.   Patient management required discussion with the following services or consulting groups:  None  Complexity of Problems Addressed Acute complicated illness or Injury  Additional Data Reviewed and Analyzed Further history obtained from: Past medical history and medications listed in the EMR, Prior ED visit notes, and Care Everywhere  Patient Encounter Risk Assessment SDOH impact on management  Final Clinical Impression(s) / ED Diagnoses Final diagnoses:  Fall, initial encounter    Rx / DC Orders ED Discharge Orders     None         Mickie Hillier, PA-C 11/18/22 2220    Hayden Rasmussen, MD 11/19/22 0930

## 2022-11-19 ENCOUNTER — Encounter: Payer: Self-pay | Admitting: Radiology

## 2023-01-06 ENCOUNTER — Emergency Department (HOSPITAL_COMMUNITY)
Admission: EM | Admit: 2023-01-06 | Discharge: 2023-01-06 | Disposition: A | Discharge status: Home / Self Care | Attending: Emergency Medicine | Admitting: Emergency Medicine

## 2023-01-06 ENCOUNTER — Encounter (HOSPITAL_COMMUNITY): Payer: Self-pay

## 2023-01-06 ENCOUNTER — Other Ambulatory Visit: Payer: Self-pay

## 2023-01-06 DIAGNOSIS — L03311 Cellulitis of abdominal wall: Secondary | ICD-10-CM | POA: Insufficient documentation

## 2023-01-06 LAB — CBC WITH DIFFERENTIAL/PLATELET
Abs Immature Granulocytes: 0.01 10*3/uL (ref 0.00–0.07)
Basophils Absolute: 0 10*3/uL (ref 0.0–0.1)
Basophils Relative: 0 %
Eosinophils Absolute: 0.1 10*3/uL (ref 0.0–0.5)
Eosinophils Relative: 2 %
HCT: 40 % (ref 39.0–52.0)
Hemoglobin: 14.3 g/dL (ref 13.0–17.0)
Immature Granulocytes: 0 %
Lymphocytes Relative: 16 %
Lymphs Abs: 1 10*3/uL (ref 0.7–4.0)
MCH: 33.9 pg (ref 26.0–34.0)
MCHC: 35.8 g/dL (ref 30.0–36.0)
MCV: 94.8 fL (ref 80.0–100.0)
Monocytes Absolute: 0.6 10*3/uL (ref 0.1–1.0)
Monocytes Relative: 9 %
Neutro Abs: 4.4 10*3/uL (ref 1.7–7.7)
Neutrophils Relative %: 73 %
Platelets: 191 10*3/uL (ref 150–400)
RBC: 4.22 MIL/uL (ref 4.22–5.81)
RDW: 12.4 % (ref 11.5–15.5)
WBC: 6.1 10*3/uL (ref 4.0–10.5)
nRBC: 0 % (ref 0.0–0.2)

## 2023-01-06 LAB — BASIC METABOLIC PANEL
Anion gap: 8 (ref 5–15)
BUN: 13 mg/dL (ref 6–20)
CO2: 28 mmol/L (ref 22–32)
Calcium: 8.6 mg/dL — ABNORMAL LOW (ref 8.9–10.3)
Chloride: 101 mmol/L (ref 98–111)
Creatinine, Ser: 0.98 mg/dL (ref 0.61–1.24)
GFR, Estimated: >60 mL/min (ref >60)
Glucose, Bld: 78 mg/dL (ref 70–99)
Potassium: 3.3 mmol/L — ABNORMAL LOW (ref 3.5–5.1)
Sodium: 137 mmol/L (ref 135–145)

## 2023-01-06 MED ORDER — OXYCODONE-ACETAMINOPHEN 5-325 MG PO TABS: 1.0000 | ORAL_TABLET | Freq: Four times a day (QID) | ORAL | 0 refills | Status: DC | PRN

## 2023-01-06 MED ORDER — LIDOCAINE-EPINEPHRINE (PF) 2 %-1:200000 IJ SOLN
10.0000 mL | Freq: Once | INTRAMUSCULAR | Status: DC
  Filled 2023-01-06: qty 20

## 2023-01-06 MED ORDER — OXYCODONE-ACETAMINOPHEN 5-325 MG PO TABS
2.0000 | ORAL_TABLET | Freq: Once | ORAL | Status: AC
  Administered 2023-01-06: 2 via ORAL
  Filled 2023-01-06: qty 2

## 2023-01-06 MED ORDER — LIDOCAINE-EPINEPHRINE-TETRACAINE (LET) TOPICAL GEL
3.0000 mL | Freq: Once | TOPICAL | Status: DC
  Filled 2023-01-06: qty 3

## 2023-01-06 MED ORDER — CLINDAMYCIN HCL 300 MG PO CAPS: 300.0000 mg | ORAL_CAPSULE | Freq: Four times a day (QID) | ORAL | 0 refills | Status: DC

## 2023-01-06 NOTE — ED Notes (Signed)
ED Provider at bedside. 

## 2023-01-06 NOTE — ED Provider Notes (Signed)
[place] EMERGENCY DEPARTMENT AT [facility] Provider Note   CSN: [id number] Arrival date & time: 01/06/23  1652     History {Add pertinent medical, surgical, social history, OB history to HPI:1} Chief Complaint  Patient presents with  . Wound Infection    Jermaine [name] is a 42 y.o. male.  HPI     Home Medications Prior to Admission medications   Medication Sig Start Date End Date Taking? Authorizing Provider  clindamycin (CLEOCIN) 300 MG capsule Take 1 capsule (300 mg total) by mouth 4 (four) times daily. X 7 days 01/06/23  Yes Tianni Escamilla, PA-C  oxyCODONE-acetaminophen (PERCOCET/ROXICET) 5-325 MG tablet Take 1 tablet by mouth every 6 (six) hours as needed for severe pain. 01/06/23  Yes Arthor Captain, PA-C  acetaminophen (TYLENOL) 325 MG tablet Take 650 mg by mouth every 6 (six) hours as needed for mild pain or headache.    [provider]  ALBUTEROL SULFATE HFA IN Inhale 2 puffs into the lungs every 4 (four) hours as needed (shortness of breath).    [provider]  atenolol (TENORMIN) 50 MG tablet Take 50 mg by mouth daily. 11/20/20   [provider]  benztropine (COGENTIN) 1 MG tablet Take 1 mg by mouth at bedtime. 09/11/19   [provider]  busPIRone (BUSPAR) 7.5 MG tablet Take 7.5 mg by mouth 3 (three) times daily. 09/11/19   [provider]  cloNIDine (CATAPRES) 0.1 MG tablet Take 1 tablet (0.1 mg total) by mouth 3 (three) times daily. Patient taking differently: Take 0.1-0.2 mg by mouth See admin instructions. Take 0.1 mg in the morning and 0.2 mg at night 03/03/19   Aldean Baker, NP  cyclobenzaprine (FLEXERIL) 10 MG tablet Take 10 mg by mouth every 8 (eight) hours as needed for muscle spasms. 07/01/20   [provider]  desvenlafaxine (PRISTIQ) 50 MG 24 hr tablet Take 50 mg by mouth daily. 09/28/19   [provider]  gabapentin (NEURONTIN) 600 MG tablet Take 600 mg by mouth 3 (three) times daily.  09/28/19   [provider]  hydrOXYzine (VISTARIL) 25 MG capsule Take 25-50 mg by mouth 3 (three) times daily. 11/06/19   [provider]  lamoTRIgine (LAMICTAL) 200 MG tablet Take 1 tablet (200 mg total) by mouth daily. 12/14/20   Pollyann Savoy, MD  levothyroxine (SYNTHROID, LEVOTHROID) 50 MCG tablet Take 50 mcg by mouth daily before breakfast.     [provider]  lithium carbonate 300 MG capsule Take 1 tablet (300 mg) by mouth in the morning and 2 tablets (600 mg) at bedtime: mood stabilization 12/14/20   Pollyann Savoy, MD  omeprazole (PRILOSEC) 20 MG capsule Take 1 capsule (20 mg total) by mouth daily. For acid reflux 09/02/18   Aldean Baker, NP  REXULTI 1 MG TABS tablet Take 1 mg by mouth at bedtime.  08/21/19   [provider]  simvastatin (ZOCOR) 20 MG tablet Take 20 mg by mouth daily.    [provider]  tadalafil (CIALIS) 20 MG tablet TAKE 1 TABLET BY MOUTH ONCE DAILY AS NEEDED FOR ERECTILE DYSFUNCTION 01/20/21   McKenzie, Mardene Celeste, MD      Allergies    Hydrocodone, Seroquel [quetiapine], and Trazodone and nefazodone    Review of Systems   Review of Systems  Physical Exam Updated Vital Signs BP (!) 130/94 (BP Location: Left Arm)   Pulse 99   Temp 98.1 F (36.7 C) (Oral)   Resp  18   Ht  (1.88 m)   Wt 96.2 kg   SpO2 98%   BMI 27.23 kg/m  Physical Exam  ED Results / Procedures / Treatments   Labs (all labs ordered are listed, but only abnormal results are displayed) Labs Reviewed  BASIC METABOLIC PANEL - Abnormal; Notable for the following components:      Result Value   Potassium 3.3 (*)    Calcium 8.6 (*)    All other components within normal limits  CBC WITH DIFFERENTIAL/PLATELET    EKG None  Radiology No results found.  Procedures Procedures  {Document cardiac monitor, telemetry assessment procedure when appropriate:1}  Medications Ordered in ED Medications  lidocaine-EPINEPHrine-tetracaine (LET)  topical gel (3 mLs Topical Not Given 01/06/23 1957)  lidocaine-EPINEPHrine (XYLOCAINE W/EPI) 2 %-1:200000 (PF) injection 10 mL (10 mLs Infiltration Not Given 01/06/23 1957)  oxyCODONE-acetaminophen (PERCOCET/ROXICET) 5-325 MG per tablet 2 tablet (2 tablets Oral Given 01/06/23 1859)    ED Course/ Medical Decision Making/ A&P   {   Click here for ABCD2, HEART and other calculatorsREFRESH Note before signing :1}                          Medical Decision Making Amount and/or Complexity of Data Reviewed Labs: ordered.  Risk Prescription drug management.   ***  {Document critical care time when appropriate:1} {Document review of labs and clinical decision tools ie heart score, Chads2Vasc2 etc:1}  {Document your independent review of radiology images, and any outside records:1} {Document your discussion with family members, caretakers, and with consultants:1} {Document social determinants of health affecting pt's care:1} {Document your decision making why or why not admission, treatments were needed:1} Final Clinical Impression(s) / ED Diagnoses Final diagnoses:  Cellulitis of abdominal wall    Rx / DC Orders ED Discharge Orders          Ordered    clindamycin (CLEOCIN) 300 MG capsule  4 times daily        01/06/23 1939    oxyCODONE-acetaminophen (PERCOCET/ROXICET) 5-325 MG tablet  Every 6 hours PRN        01/06/23 1941

## 2023-01-06 NOTE — ED Triage Notes (Signed)
Pt presents to ED from home with c/o wound on abdomen that he noticed a couple of days ago, thought it was a bug bite, squeezed bump- pus came out- now bump is hard and painful, warm to touch, redness surrounding wound is getting larger.

## 2023-01-06 NOTE — ED Notes (Signed)
Gave pt prepack. Went over [employer]. All questions answered. Ambulatory to lobby

## 2023-01-06 NOTE — Discharge Instructions (Addendum)
Contact a health care provider if: You have a fever. Your symptoms do not improve within 1-2 days of starting treatment or you develop new symptoms. Your bone or joint underneath the infected area becomes painful after the skin has healed. Your infection returns in the same area or another area. Signs of this may include: You notice a swollen bump in the infected area. Your red area gets larger, turns dark in color, or becomes more painful. Drainage increases. Pus or a bad smell develops in your infected area. You have more pain. You feel ill and have muscle aches and weakness. You develop vomiting or diarrhea that will not go away. Get help right away if: You notice red streaks coming from the infected area. You notice the skin turns purple or black and falls off. This symptom may be an emergency. Get help right away. Call 911. Do not wait to see if the symptom will go away. Do not drive yourself to the hospital. This information is not intended to replace advice given to you by your health care provider. Make sure you discuss any questions you have with your health care provider.

## 2023-01-07 MED FILL — Oxycodone w/ Acetaminophen Tab 5-325 MG: ORAL | Qty: 6 | Status: AC

## 2023-02-08 ENCOUNTER — Encounter: Payer: Self-pay | Admitting: Orthopedic Surgery

## 2023-02-08 ENCOUNTER — Ambulatory Visit: Payer: Medicaid Other | Admitting: Orthopedic Surgery

## 2023-02-08 ENCOUNTER — Other Ambulatory Visit (INDEPENDENT_AMBULATORY_CARE_PROVIDER_SITE_OTHER): Payer: Medicaid Other

## 2023-02-08 VITALS — BP 146/94 | HR 94 | Ht 74.0 in | Wt 210.0 lb

## 2023-02-08 DIAGNOSIS — G8929 Other chronic pain: Secondary | ICD-10-CM

## 2023-02-08 DIAGNOSIS — M25561 Pain in right knee: Secondary | ICD-10-CM | POA: Diagnosis not present

## 2023-02-08 DIAGNOSIS — M1711 Unilateral primary osteoarthritis, right knee: Secondary | ICD-10-CM | POA: Diagnosis not present

## 2023-02-08 DIAGNOSIS — M5416 Radiculopathy, lumbar region: Secondary | ICD-10-CM

## 2023-02-08 DIAGNOSIS — Z9889 Other specified postprocedural states: Secondary | ICD-10-CM

## 2023-02-08 DIAGNOSIS — Z87828 Personal history of other (healed) physical injury and trauma: Secondary | ICD-10-CM

## 2023-02-08 MED ORDER — NAPROXEN 500 MG PO TABS: 500.0000 mg | ORAL_TABLET | Freq: Two times a day (BID) | ORAL | 2 refills | Status: DC
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
## 2023-02-08 NOTE — Patient Instructions (Signed)
Physical therapy has been ordered for you at [facility]. They should call you to schedule, [phone] is the phone number to call, if you want to call to schedule.   

## 2023-02-15 ENCOUNTER — Emergency Department (HOSPITAL_COMMUNITY)
Admission: EM | Admit: 2023-02-15 | Discharge: 2023-02-16 | Disposition: A | Discharge status: Home / Self Care | Payer: Medicaid Other | Attending: Emergency Medicine | Admitting: Emergency Medicine

## 2023-02-15 ENCOUNTER — Other Ambulatory Visit: Payer: Self-pay

## 2023-02-15 DIAGNOSIS — M545 Low back pain, unspecified: Secondary | ICD-10-CM | POA: Diagnosis not present

## 2023-02-15 DIAGNOSIS — X58XXXA Exposure to other specified factors, initial encounter: Secondary | ICD-10-CM | POA: Diagnosis not present

## 2023-02-15 DIAGNOSIS — R7981 Abnormal blood-gas level: Secondary | ICD-10-CM | POA: Insufficient documentation

## 2023-02-15 DIAGNOSIS — S0001XA Abrasion of scalp, initial encounter: Secondary | ICD-10-CM | POA: Diagnosis not present

## 2023-02-15 DIAGNOSIS — G8929 Other chronic pain: Secondary | ICD-10-CM | POA: Diagnosis not present

## 2023-02-15 DIAGNOSIS — R258 Other abnormal involuntary movements: Secondary | ICD-10-CM | POA: Insufficient documentation

## 2023-02-15 DIAGNOSIS — R569 Unspecified convulsions: Secondary | ICD-10-CM

## 2023-02-15 DIAGNOSIS — S0990XA Unspecified injury of head, initial encounter: Secondary | ICD-10-CM | POA: Diagnosis present

## 2023-02-15 LAB — CBC WITH DIFFERENTIAL/PLATELET
Abs Immature Granulocytes: 0.02 10*3/uL (ref 0.00–0.07)
Basophils Absolute: 0 10*3/uL (ref 0.0–0.1)
Basophils Relative: 0 %
Eosinophils Absolute: 0.2 10*3/uL (ref 0.0–0.5)
Eosinophils Relative: 3 %
HCT: 43.3 % (ref 39.0–52.0)
Hemoglobin: 15 g/dL (ref 13.0–17.0)
Immature Granulocytes: 0 %
Lymphocytes Relative: 25 %
Lymphs Abs: 1.8 10*3/uL (ref 0.7–4.0)
MCH: 33.4 pg (ref 26.0–34.0)
MCHC: 34.6 g/dL (ref 30.0–36.0)
MCV: 96.4 fL (ref 80.0–100.0)
Monocytes Absolute: 0.6 10*3/uL (ref 0.1–1.0)
Monocytes Relative: 9 %
Neutro Abs: 4.4 10*3/uL (ref 1.7–7.7)
Neutrophils Relative %: 63 %
Platelets: 200 10*3/uL (ref 150–400)
RBC: 4.49 MIL/uL (ref 4.22–5.81)
RDW: 12.6 % (ref 11.5–15.5)
WBC: 7.1 10*3/uL (ref 4.0–10.5)
nRBC: 0 % (ref 0.0–0.2)

## 2023-02-15 LAB — COMPREHENSIVE METABOLIC PANEL
ALT: 29 U/L (ref 0–44)
AST: 36 U/L (ref 15–41)
Albumin: 3.8 g/dL (ref 3.5–5.0)
Alkaline Phosphatase: 80 U/L (ref 38–126)
Anion gap: 17 — ABNORMAL HIGH (ref 5–15)
BUN: 14 mg/dL (ref 6–20)
CO2: 17 mmol/L — ABNORMAL LOW (ref 22–32)
Calcium: 8.5 mg/dL — ABNORMAL LOW (ref 8.9–10.3)
Chloride: 103 mmol/L (ref 98–111)
Creatinine, Ser: 1.14 mg/dL (ref 0.61–1.24)
GFR, Estimated: >60 mL/min (ref >60)
Glucose, Bld: 97 mg/dL (ref 70–99)
Potassium: 3.3 mmol/L — ABNORMAL LOW (ref 3.5–5.1)
Sodium: 137 mmol/L (ref 135–145)
Total Bilirubin: 0.4 mg/dL (ref 0.3–1.2)
Total Protein: 7.2 g/dL (ref 6.5–8.1)

## 2023-02-15 LAB — ACETAMINOPHEN LEVEL: Acetaminophen (Tylenol), Serum: <10 ug/mL — ABNORMAL LOW (ref 10–30)

## 2023-02-15 LAB — SALICYLATE LEVEL: Salicylate Lvl: <7 mg/dL — ABNORMAL LOW (ref 7.0–30.0)

## 2023-02-15 LAB — ETHANOL: Alcohol, Ethyl (B): <10 mg/dL (ref <10)
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
## 2023-02-15 NOTE — ED Provider Notes (Signed)
[place] EMERGENCY DEPARTMENT AT [facility]  Provider Note  CSN: [id number] Arrival date & time: 02/15/23 2213  History Chief Complaint  Patient presents with   Seizures    Jermaine [name] is a 42 y.o. 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 EMS was called to bring him to the ED tonight for possible seizure. It isn't clear who called EMS, they reported the episode was unwitnessed while enroute he had additional episodes which stopped and started while the patient was asking for them to 'give him the good stuff'. He is somnolent and not able to give much history, states he is taking 'a blue pill' which he does not know the name of that was prescribed while he was in jail recently. Got out about a month ago.    Home Medications Prior to Admission medications   Not on File     Allergies    Hydrocodone, Seroquel [quetiapine], and Trazodone and nefazodone   Review of Systems   Review of Systems Please see HPI for pertinent positives and negatives  Physical Exam BP 130/77   Pulse 84   Temp (!) 97.4 F (36.3 C) (Axillary)   Resp (!) 28   SpO2 98%   Physical Exam Vitals and nursing note reviewed.  HENT:     Head: Normocephalic.     Comments: Abrasion on scalp, appears to be shaving injury    Nose: Nose normal.     Mouth/Throat:     Mouth: Mucous membranes are moist.  Eyes:     Extraocular Movements: Extraocular movements intact.     Conjunctiva/sclera: Conjunctivae normal.  Cardiovascular:     Rate and Rhythm: Normal rate.  Pulmonary:     Effort: Pulmonary effort is normal.     Breath sounds: Normal breath sounds.  Abdominal:     General: Abdomen is flat.     Palpations: Abdomen is soft.     Tenderness: There is no abdominal tenderness.  Musculoskeletal:        General: No swelling. Normal range of motion.     Cervical back: Neck supple.  Skin:    General: Skin is warm and  dry.  Neurological:     Comments: Somnolent, unable to perform exam  Psychiatric:     Comments: somnolent     ED Results / Procedures / Treatments   EKG EKG Interpretation  Date/Time:  Monday Feb 15 2023 23:25:15 EDT Ventricular Rate:  84 PR Interval:  139 QRS Duration: 106 QT Interval:  357 QTC Calculation: 422 R Axis:   161 Text Interpretation: Right and left arm electrode reversal, interpretation assumes no reversal Sinus or ectopic atrial rhythm Probable lateral infarct, age indeterminate No significant change since last tracing Confirmed by Susy Frizzle [id number]) on 02/15/2023 11:27:59 PM  Procedures Procedures  Medications Ordered in the ED Medications  ketorolac (TORADOL) 30 MG/ML injection 15 mg (15 mg Intravenous Given 02/16/23 0222)    Initial Impression and Plan  Patient here with a possible seizure-like episode, although unclear who called EMS. He is not able to give much additional history now. Will check labs including for toxic ingestion. Continue to monitor in the ED.   ED Course   Clinical Course as of 02/16/23 0312  Mon Feb 15, 2023  2346 CBC is normal.  [CS]  2349 CMP with mildly decreased bicarb and K, otherwise unremarkable.  [CS]  2357 I just spoke with wife  who is also a patient here. She reports he had a seizure around 2pm today while sitting on the stairs at their house. She reports he typically has an episode when he is stressed out and she reports they were arguing when this happened. He recovered and they went shopping later in the day. She came home a few hours later and found him unresponsive under his truck he had been working on. She reports he was not breathing so she performed CPR and 'got him back' at which point she called EMS. She also reports he is 'on a blue pill' but does not know the name of it. She reports he drinks occasionally, and uses cocaine frequently. Will add CT head given unclear circumstances, continued somnolence and abrasion  on scalp of unknown etiology.  [CS]  2359 EtOH, ASA and APAP are neg.  [CS]  Tue Feb 16, 2023  0110 I personally viewed the images from radiology studies and agree with radiologist interpretation: CT is negative.  [CS]  0212 Patient more alert now. Reports some back soreness. Will give a dose of toradol for pain. Encouraged to give a urine specimen to complete his medical evaluation.  [CS]  0256 UDS is positive for THC.  [CS]  0309 Patient reports no change in back pain with Toradol. Has history of chronic back pain, previously under pain management but no longer going. He is requesting something stronger for pain. Advised that opiates are not indicated at this time. Encouraged to follow up with his PCP for long term management. He is otherwise back to baseline and cleared for discharge home.  [CS]    Clinical Course User Index [CS] Pollyann Savoy, MD     MDM Rules/Calculators/A&P Medical Decision Making Problems Addressed: Chronic low back pain, unspecified back pain laterality, unspecified whether sciatica present: chronic illness or injury Seizure-like activity [facility]): acute illness or injury  Amount and/or Complexity of Data Reviewed Labs: ordered. Decision-making details documented in ED Course. Radiology: ordered and independent interpretation performed. Decision-making details documented in ED Course.  Risk Prescription drug management.     Final Clinical Impression(s) / ED Diagnoses Final diagnoses:  Seizure-like activity (HCC)  Chronic low back pain, unspecified back pain laterality, unspecified whether sciatica present    Rx / DC Orders ED Discharge Orders     None        Pollyann Savoy, MD 02/16/23 [phone]

## 2023-02-16 ENCOUNTER — Emergency Department (HOSPITAL_COMMUNITY): Payer: Medicaid Other

## 2023-02-16 LAB — RAPID URINE DRUG SCREEN, HOSP PERFORMED
Amphetamines: NOT DETECTED
Barbiturates: NOT DETECTED
Benzodiazepines: NOT DETECTED
Cocaine: NOT DETECTED
Opiates: NOT DETECTED
Tetrahydrocannabinol: POSITIVE — AB

## 2023-02-16 MED ORDER — KETOROLAC TROMETHAMINE 30 MG/ML IJ SOLN
15.0000 mg | Freq: Once | INTRAMUSCULAR | Status: AC
  Administered 2023-02-16: 15 mg via INTRAVENOUS
  Filled 2023-02-16: qty 1

## 2023-02-23 ENCOUNTER — Ambulatory Visit (HOSPITAL_COMMUNITY): Payer: Medicaid Other | Attending: Orthopedic Surgery | Admitting: Physical Therapy

## 2023-03-08 ENCOUNTER — Emergency Department (HOSPITAL_COMMUNITY): Payer: Medicaid Other

## 2023-03-08 ENCOUNTER — Other Ambulatory Visit: Payer: Self-pay

## 2023-03-08 ENCOUNTER — Encounter (HOSPITAL_COMMUNITY): Payer: Self-pay | Admitting: Emergency Medicine

## 2023-03-08 ENCOUNTER — Emergency Department (HOSPITAL_COMMUNITY)
Admission: EM | Admit: 2023-03-08 | Discharge: 2023-03-09 | Disposition: A | Discharge status: Home / Self Care | Payer: Medicaid Other | Attending: Emergency Medicine | Admitting: Emergency Medicine

## 2023-03-08 DIAGNOSIS — S62025A Nondisplaced fracture of middle third of navicular [scaphoid] bone of left wrist, initial encounter for closed fracture: Secondary | ICD-10-CM | POA: Insufficient documentation

## 2023-03-08 DIAGNOSIS — Y92410 Unspecified street and highway as the place of occurrence of the external cause: Secondary | ICD-10-CM | POA: Diagnosis not present

## 2023-03-08 DIAGNOSIS — S92255A Nondisplaced fracture of navicular [scaphoid] of left foot, initial encounter for closed fracture: Secondary | ICD-10-CM

## 2023-03-08 DIAGNOSIS — M542 Cervicalgia: Secondary | ICD-10-CM | POA: Diagnosis not present

## 2023-03-08 DIAGNOSIS — S80212A Abrasion, left knee, initial encounter: Secondary | ICD-10-CM | POA: Diagnosis not present

## 2023-03-08 DIAGNOSIS — Y9355 Activity, bike riding: Secondary | ICD-10-CM | POA: Insufficient documentation

## 2023-03-08 DIAGNOSIS — S40211A Abrasion of right shoulder, initial encounter: Secondary | ICD-10-CM | POA: Diagnosis not present

## 2023-03-08 DIAGNOSIS — S99922A Unspecified injury of left foot, initial encounter: Secondary | ICD-10-CM | POA: Diagnosis present

## 2023-03-08 DIAGNOSIS — S92002A Unspecified fracture of left calcaneus, initial encounter for closed fracture: Secondary | ICD-10-CM

## 2023-03-08 DIAGNOSIS — S92015A Nondisplaced fracture of body of left calcaneus, initial encounter for closed fracture: Secondary | ICD-10-CM | POA: Diagnosis not present
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
## 2023-03-09 ENCOUNTER — Emergency Department (HOSPITAL_COMMUNITY): Payer: Medicaid Other

## 2023-03-09 MED ORDER — OXYCODONE-ACETAMINOPHEN 5-325 MG PO TABS
1.0000 | ORAL_TABLET | Freq: Once | ORAL | Status: AC
  Administered 2023-03-09: 1 via ORAL
  Filled 2023-03-09: qty 1

## 2023-03-09 MED ORDER — OXYCODONE-ACETAMINOPHEN 5-325 MG PO TABS: 1.0000 | ORAL_TABLET | Freq: Four times a day (QID) | ORAL | 0 refills | Status: DC | PRN
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
## 2023-03-15 ENCOUNTER — Encounter: Payer: Self-pay | Admitting: Orthopedic Surgery

## 2023-03-15 ENCOUNTER — Other Ambulatory Visit (INDEPENDENT_AMBULATORY_CARE_PROVIDER_SITE_OTHER): Payer: Medicaid Other

## 2023-03-15 ENCOUNTER — Other Ambulatory Visit: Payer: Self-pay

## 2023-03-15 ENCOUNTER — Ambulatory Visit: Payer: Medicaid Other | Admitting: Orthopedic Surgery

## 2023-03-15 DIAGNOSIS — S8002XA Contusion of left knee, initial encounter: Secondary | ICD-10-CM | POA: Diagnosis not present

## 2023-03-15 DIAGNOSIS — M79604 Pain in right leg: Secondary | ICD-10-CM

## 2023-03-15 DIAGNOSIS — M25571 Pain in right ankle and joints of right foot: Secondary | ICD-10-CM

## 2023-03-15 DIAGNOSIS — S92002A Unspecified fracture of left calcaneus, initial encounter for closed fracture: Secondary | ICD-10-CM | POA: Diagnosis not present

## 2023-03-15 DIAGNOSIS — S9001XA Contusion of right ankle, initial encounter: Secondary | ICD-10-CM

## 2023-03-15 MED ORDER — OXYCODONE-ACETAMINOPHEN 5-325 MG PO TABS
1.0000 | ORAL_TABLET | Freq: Four times a day (QID) | ORAL | 0 refills | Status: DC | PRN
Start: 2023-03-15 — End: 2023-03-16

## 2023-03-15 MED ORDER — TIZANIDINE HCL 4 MG PO TABS
4.0000 mg | ORAL_TABLET | Freq: Four times a day (QID) | ORAL | 0 refills | Status: DC | PRN
Start: 2023-03-15 — End: 2023-03-16

## 2023-03-15 MED ORDER — TIZANIDINE HCL 4 MG PO TABS
4.0000 mg | ORAL_TABLET | Freq: Four times a day (QID) | ORAL | 0 refills | Status: DC | PRN
Start: 2023-03-15 — End: 2023-03-15

## 2023-03-15 MED ORDER — IBUPROFEN 800 MG PO TABS: 800.0000 mg | ORAL_TABLET | Freq: Three times a day (TID) | ORAL | 1 refills | Status: DC | PRN
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
## 2023-03-15 NOTE — Patient Instructions (Signed)
Use heat and ice as needed   Take medications as ordered   Take epsom salt baths

## 2023-03-15 NOTE — Telephone Encounter (Signed)
He told me Jermaine [name] now he wants [facility]

## 2023-03-15 NOTE — Telephone Encounter (Signed)
Also send his Zanaflex to [facility] on [address]

## 2023-03-15 NOTE — Telephone Encounter (Signed)
Patient came back in and stated that [facility] doesn't have the Oxycodone-Acetaminophen 7.5/325 MG in stock.  He is asking if Dr. Romeo Apple would please send it to [facility] on [address]

## 2023-03-16 ENCOUNTER — Other Ambulatory Visit: Payer: Self-pay

## 2023-03-16 MED ORDER — OXYCODONE-ACETAMINOPHEN 5-325 MG PO TABS
1.0000 | ORAL_TABLET | Freq: Four times a day (QID) | ORAL | 0 refills | Status: DC | PRN
Start: 2023-03-16 — End: 2023-03-21

## 2023-03-16 MED ORDER — TIZANIDINE HCL 4 MG PO TABS
4.0000 mg | ORAL_TABLET | Freq: Four times a day (QID) | ORAL | 0 refills | Status: DC | PRN
Start: 2023-03-16 — End: 2023-04-06

## 2023-03-16 NOTE — Telephone Encounter (Signed)
ibuprofen (ADVIL) 800 MG tablet Take 1 tablet (800 mg total) by mouth every 8 (eight) hours as needed. Qty 90 tablets   oxyCODONE-acetaminophen (PERCOCET/ROXICET) 5-325 MG tablet    Take 1 tablet by mouth every 6 (six) hours as needed for up to 5 days for severe pain.  Qty 20 Tablets     PATIENT USES [facility] ON [address]  [facility] is out of Oxycodone

## 2023-03-19 ENCOUNTER — Other Ambulatory Visit: Payer: Self-pay | Admitting: Orthopedic Surgery

## 2023-03-19 ENCOUNTER — Encounter: Payer: Self-pay | Admitting: Orthopedic Surgery

## 2023-03-21 ENCOUNTER — Emergency Department (HOSPITAL_COMMUNITY)
Admission: EM | Admit: 2023-03-21 | Discharge: 2023-03-21 | Disposition: A | Discharge status: Home / Self Care | Payer: Medicaid Other | Attending: Emergency Medicine | Admitting: Emergency Medicine

## 2023-03-21 ENCOUNTER — Other Ambulatory Visit: Payer: Self-pay

## 2023-03-21 DIAGNOSIS — E119 Type 2 diabetes mellitus without complications: Secondary | ICD-10-CM | POA: Diagnosis not present

## 2023-03-21 DIAGNOSIS — I10 Essential (primary) hypertension: Secondary | ICD-10-CM | POA: Insufficient documentation

## 2023-03-21 DIAGNOSIS — I2510 Atherosclerotic heart disease of native coronary artery without angina pectoris: Secondary | ICD-10-CM | POA: Diagnosis not present

## 2023-03-21 DIAGNOSIS — J45909 Unspecified asthma, uncomplicated: Secondary | ICD-10-CM | POA: Diagnosis not present

## 2023-03-21 DIAGNOSIS — L02412 Cutaneous abscess of left axilla: Secondary | ICD-10-CM | POA: Diagnosis present

## 2023-03-21 MED ORDER — OXYCODONE-ACETAMINOPHEN 5-325 MG PO TABS
2.0000 | ORAL_TABLET | Freq: Once | ORAL | Status: AC
  Administered 2023-03-21: 2 via ORAL
  Filled 2023-03-21: qty 2

## 2023-03-21 MED ORDER — LIDOCAINE HCL (PF) 2 % IJ SOLN
INTRAMUSCULAR | Status: AC
  Filled 2023-03-21: qty 10

## 2023-03-21 MED ORDER — POVIDONE-IODINE 10 % EX SOLN
CUTANEOUS | Status: DC | PRN
  Filled 2023-03-21: qty 14.8

## 2023-03-21 MED ORDER — LIDOCAINE HCL (PF) 2 % IJ SOLN
10.0000 mL | Freq: Once | INTRAMUSCULAR | Status: AC
  Administered 2023-03-21: 10 mL

## 2023-03-21 MED ORDER — SULFAMETHOXAZOLE-TRIMETHOPRIM 800-160 MG PO TABS: 1.0000 | ORAL_TABLET | Freq: Two times a day (BID) | ORAL | 0 refills | Status: AC

## 2023-03-21 MED ORDER — OXYCODONE-ACETAMINOPHEN 5-325 MG PO TABS: 1.0000 | ORAL_TABLET | ORAL | 0 refills | Status: DC | PRN

## 2023-03-21 MED ORDER — SULFAMETHOXAZOLE-TRIMETHOPRIM 800-160 MG PO TABS
1.0000 | ORAL_TABLET | Freq: Once | ORAL | Status: AC
  Administered 2023-03-21: 1 via ORAL
  Filled 2023-03-21: qty 1

## 2023-03-21 NOTE — ED Notes (Signed)
Patient states he does have High BP. Unmediated.

## 2023-03-21 NOTE — ED Triage Notes (Signed)
Pt c/o left underarm cyst x 3 days. No drainage noted with redness . Afebrile.

## 2023-03-21 NOTE — Discharge Instructions (Signed)
Take the entire course of the antibiotics for 10 days.  Do not drive within 4 hours of taking the pain medication as this will make you drowsy.  Keep your site covered with a dressing until it is no longer draining.  I do want you to continue using warm Epsom salt soaks for 15 to 20 minutes several times daily keeping this open and draining until antibiotic has really helped to resolve this infection.

## 2023-03-21 NOTE — ED Provider Notes (Signed)
EMERGENCY DEPARTMENT AT [facility] Provider Note   CSN: [id number] Arrival date & time: 03/21/23  1544     History  Chief Complaint  Patient presents with   Cyst    Jermaine [name] is a 42 y.o. male with a history including hypertension, GERD, asthma, CAD with history of MI, diabetes and hyperlipidemia, infrequent episodes of random abscesses, stating he has been tested in the past and is negative for MRSA presenting for an abscess in his left axilla which started 3 days ago.  He has applied warm compresses to the site but has not helped it to start draining.  He has significant pain with palpation in moving the arm, he denies fevers or chills, nausea or vomiting.  The history is provided by the patient.       Home Medications Prior to Admission medications   Medication Sig Start Date End Date Taking? Authorizing Provider  oxyCODONE-acetaminophen (PERCOCET/ROXICET) 5-325 MG tablet Take 1 tablet by mouth every 4 (four) hours as needed. 03/21/23  Yes Mikala Podoll, Raynelle Fanning, PA-C  sulfamethoxazole-trimethoprim (BACTRIM DS) 800-160 MG tablet Take 1 tablet by mouth 2 (two) times daily for 7 days. 03/21/23 03/28/23 Yes Minal Stuller, Raynelle Fanning, PA-C  ibuprofen (ADVIL) 800 MG tablet Take 1 tablet (800 mg total) by mouth every 8 (eight) hours as needed. 03/15/23   Vickki Hearing, MD  tiZANidine (ZANAFLEX) 4 MG tablet Take 1 tablet (4 mg total) by mouth every 6 (six) hours as needed for muscle spasms. 03/16/23   Vickki Hearing, MD      Allergies    Hydrocodone, Seroquel [quetiapine], and Trazodone and nefazodone    Review of Systems   Review of Systems  Constitutional:  Negative for chills and fever.  HENT:  Negative for congestion and sore throat.   Eyes: Negative.   Respiratory:  Negative for chest tightness and shortness of breath.   Cardiovascular:  Negative for chest pain.  Gastrointestinal:  Negative for abdominal pain and nausea.  Genitourinary: Negative.   Musculoskeletal:   Negative for arthralgias, joint swelling and neck pain.  Skin:  Positive for color change. Negative for rash.       Negative except as mentioned in HPI.    Neurological:  Negative for dizziness, weakness, light-headedness, numbness and headaches.  Psychiatric/Behavioral: Negative.      Physical Exam Updated Vital Signs BP (!) 156/127 (BP Location: Right Arm)   Pulse (!) 115   Temp 98.1 F (36.7 C) (Oral)   Resp 18   Ht 6\' 2"  (1.88 m)   Wt 102.1 kg   SpO2 99%   BMI 28.89 kg/m  Physical Exam Constitutional:      Appearance: He is well-developed.  HENT:     Head: Normocephalic.  Cardiovascular:     Rate and Rhythm: Normal rate.  Pulmonary:     Effort: Pulmonary effort is normal.  Skin:    Findings: Abscess present.     Comments: 2 cm raised area of fluctuance, erythematous in the left axillary crease.  There is no spontaneous drainage from the site.  It is exquisitely tender to palpation.  Neurological:     Mental Status: He is alert and oriented to person, place, and time.     Sensory: No sensory deficit.     ED Results / Procedures / Treatments   Labs (all labs ordered are listed, but only abnormal results are displayed) Labs Reviewed - No data to display  EKG None  Radiology No results found.  Procedures Procedures    INCISION AND DRAINAGE Performed by: Burgess Amor Consent: Verbal consent obtained. Risks and benefits: risks, benefits and alternatives were discussed Type: abscess  Body area: left axilla  Anesthesia: local infiltration  Incision was made with a scalpel.  Local anesthetic: lidocaine 2% without epinephrine  Anesthetic total: 5 ml  Complexity: complex Blunt dissection to break up loculations  Drainage: purulent  Drainage amount: moderate  Packing material: none  Patient tolerance: Patient tolerated the procedure well with no immediate complications.    Medications Ordered in ED Medications  povidone-iodine (BETADINE) 10 %  external solution ( Topical Given 03/21/23 1809)  lidocaine HCl (PF) (XYLOCAINE) 2 % injection 10 mL (10 mLs Other Given 03/21/23 1809)  oxyCODONE-acetaminophen (PERCOCET/ROXICET) 5-325 MG per tablet 2 tablet (2 tablets Oral Given 03/21/23 1839)  sulfamethoxazole-trimethoprim (BACTRIM DS) 800-160 MG per tablet 1 tablet (1 tablet Oral Given 03/21/23 1855)    ED Course/ Medical Decision Making/ A&P                             Medical Decision Making Patient presenting with a 3-day history of infected abscess in left axilla, prior history of same.  He does have some subtle changes in his axilla suggesting perhaps some mild hidradenitis suppurativa.  Abscess is amenable to drainage and patient tolerated the procedure very well once the site was anesthetized.  Discussed home treatment including continued warm soaks, he was started on Bactrim.  Return precautions were outlined.  Risk Prescription drug management.           Final Clinical Impression(s) / ED Diagnoses Final diagnoses:  Abscess of axilla, left    Rx / DC Orders ED Discharge Orders          Ordered    sulfamethoxazole-trimethoprim (BACTRIM DS) 800-160 MG tablet  2 times daily        03/21/23 1843    oxyCODONE-acetaminophen (PERCOCET/ROXICET) 5-325 MG tablet  Every 4 hours PRN        03/21/23 1843              Burgess Amor, PA-C 03/21/23 2222    Terrilee Files, MD 03/22/23 1110

## 2023-03-22 ENCOUNTER — Ambulatory Visit: Payer: Medicaid Other | Admitting: Orthopedic Surgery

## 2023-03-23 MED ORDER — OXYCODONE-ACETAMINOPHEN 5-325 MG PO TABS: 1.0000 | ORAL_TABLET | Freq: Three times a day (TID) | ORAL | 0 refills | Status: AC | PRN

## 2023-04-01 ENCOUNTER — Other Ambulatory Visit: Payer: Self-pay | Admitting: Orthopedic Surgery

## 2023-04-01 DIAGNOSIS — T148XXA Other injury of unspecified body region, initial encounter: Secondary | ICD-10-CM

## 2023-04-01 MED ORDER — OXYCODONE-ACETAMINOPHEN 5-325 MG PO TABS: 1.0000 | ORAL_TABLET | Freq: Four times a day (QID) | ORAL | 0 refills | Status: DC | PRN

## 2023-04-01 NOTE — Progress Notes (Signed)
Requested Prescriptions   Signed Prescriptions Disp Refills   oxyCODONE-acetaminophen (PERCOCET/ROXICET) 5-325 MG tablet 20 tablet 0    Sig: Take 1 tablet by mouth every 6 (six) hours as needed for up to 5 days.

## 2023-04-06 ENCOUNTER — Other Ambulatory Visit: Payer: Self-pay | Admitting: Orthopedic Surgery

## 2023-04-06 DIAGNOSIS — T148XXA Other injury of unspecified body region, initial encounter: Secondary | ICD-10-CM

## 2023-04-06 MED ORDER — TIZANIDINE HCL 4 MG PO TABS
4.0000 mg | ORAL_TABLET | Freq: Four times a day (QID) | ORAL | 0 refills | Status: DC | PRN
Start: 2023-04-06 — End: 2023-06-28

## 2023-04-07 MED ORDER — OXYCODONE-ACETAMINOPHEN 5-325 MG PO TABS
1.0000 | ORAL_TABLET | Freq: Three times a day (TID) | ORAL | 0 refills | Status: AC | PRN
Start: 2023-04-07 — End: ?

## 2023-04-08 NOTE — Congregational Nurse Program (Unsigned)
Patient presented with c/o high blood pressure. Referred to MD, stated he would go. He is not taking medication because did not like the way it made him feel.

## 2023-04-12 ENCOUNTER — Ambulatory Visit: Payer: Medicaid Other | Admitting: Orthopedic Surgery

## 2023-04-13 ENCOUNTER — Other Ambulatory Visit: Payer: Self-pay

## 2023-04-13 ENCOUNTER — Ambulatory Visit (HOSPITAL_COMMUNITY): Payer: MEDICAID | Attending: Orthopedic Surgery | Admitting: Physical Therapy

## 2023-04-13 ENCOUNTER — Encounter (HOSPITAL_COMMUNITY): Payer: Self-pay | Admitting: Physical Therapy

## 2023-04-13 DIAGNOSIS — G8929 Other chronic pain: Secondary | ICD-10-CM | POA: Insufficient documentation

## 2023-04-13 DIAGNOSIS — R2689 Other abnormalities of gait and mobility: Secondary | ICD-10-CM | POA: Insufficient documentation

## 2023-04-13 DIAGNOSIS — M6281 Muscle weakness (generalized): Secondary | ICD-10-CM | POA: Insufficient documentation

## 2023-04-13 DIAGNOSIS — R29898 Other symptoms and signs involving the musculoskeletal system: Secondary | ICD-10-CM | POA: Insufficient documentation

## 2023-04-13 DIAGNOSIS — M25561 Pain in right knee: Secondary | ICD-10-CM | POA: Diagnosis present
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
## 2023-04-14 ENCOUNTER — Other Ambulatory Visit: Payer: Self-pay | Admitting: Orthopedic Surgery

## 2023-04-14 DIAGNOSIS — T148XXA Other injury of unspecified body region, initial encounter: Secondary | ICD-10-CM

## 2023-04-16 ENCOUNTER — Other Ambulatory Visit: Payer: Self-pay | Admitting: Orthopedic Surgery

## 2023-04-16 DIAGNOSIS — T148XXA Other injury of unspecified body region, initial encounter: Secondary | ICD-10-CM

## 2023-04-21 ENCOUNTER — Ambulatory Visit (HOSPITAL_COMMUNITY): Payer: MEDICAID | Admitting: Physical Therapy

## 2023-04-29 ENCOUNTER — Ambulatory Visit (HOSPITAL_COMMUNITY): Payer: MEDICAID | Attending: Orthopedic Surgery

## 2023-04-29 ENCOUNTER — Encounter (HOSPITAL_COMMUNITY): Payer: Self-pay
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
## 2023-05-04 ENCOUNTER — Encounter (HOSPITAL_COMMUNITY): Payer: Self-pay | Admitting: Physical Therapy

## 2023-05-04 ENCOUNTER — Ambulatory Visit (HOSPITAL_COMMUNITY): Payer: MEDICAID | Admitting: Physical Therapy

## 2023-05-04 ENCOUNTER — Telehealth (HOSPITAL_COMMUNITY): Payer: Self-pay | Admitting: Physical Therapy
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
## 2023-05-04 NOTE — Telephone Encounter (Signed)
Patient no show #3, (first one, patient was not phoned to follow up but 2nd one was). Phoned today but unable to leave voicemail. Patient to be discharged due to non compliance with attendance policy as he has no showed last 3 appointments and has not returned since evaluation. Remaining appointments cancelled. Patient will need new order if he wishes to return.  3:07 PM, 05/04/23 Wyman Songster PT, DPT Physical Therapist at [facility]

## 2023-05-04 NOTE — Therapy (Signed)
[facility] Outpatient Rehabilitation at [facility] [address] [place], [place], [address] Phone: [phone]   Fax:  [phone]  Patient Details  Name: Jermaine [name] MRN: [id number] Date of Birth: [birth year] Referring Provider:  No ref. provider found  Encounter Date: 05/04/2023   PHYSICAL THERAPY DISCHARGE SUMMARY  Visits from Start of Care: 1  Current functional level related to goals / functional outcomes: Unknown as patient has not returned. Patient discharged due to 3 no show appointments and has not returned since evaluation.    Remaining deficits: Unknown as patient has not returned.    Education / Equipment: HEP   Patient agrees to discharge. Patient goals were not met. Patient is being discharged due to not returning since the last visit.  3:02 PM, 05/04/23 Wyman Songster PT, DPT Physical Therapist at [facility]    [facility] Outpatient Rehabilitation at [facility] [address] [place], [place], [address] Phone: [phone]   Fax:  [phone]

## 2023-05-06 ENCOUNTER — Ambulatory Visit: Payer: MEDICAID | Admitting: Orthopedic Surgery

## 2023-05-11 ENCOUNTER — Ambulatory Visit (HOSPITAL_COMMUNITY): Payer: MEDICAID | Admitting: Physical Therapy

## 2023-05-18 ENCOUNTER — Ambulatory Visit (HOSPITAL_COMMUNITY): Payer: MEDICAID

## 2023-05-31 ENCOUNTER — Ambulatory Visit: Payer: MEDICAID | Admitting: Orthopedic Surgery

## 2023-05-31 VITALS — BP 105/45 | HR 85 | Ht 74.0 in

## 2023-05-31 DIAGNOSIS — S93622D Sprain of tarsometatarsal ligament of left foot, subsequent encounter: Secondary | ICD-10-CM

## 2023-05-31 DIAGNOSIS — S92015D Nondisplaced fracture of body of left calcaneus, subsequent encounter for fracture with routine healing: Secondary | ICD-10-CM

## 2023-05-31 DIAGNOSIS — T148XXA Other injury of unspecified body region, initial encounter: Secondary | ICD-10-CM

## 2023-05-31 MED ORDER — GABAPENTIN 300 MG PO CAPS
300.0000 mg | ORAL_CAPSULE | Freq: Three times a day (TID) | ORAL | 5 refills | Status: AC
Start: 2023-05-31 — End: ?

## 2023-05-31 MED ORDER — ACETAMINOPHEN-CODEINE 300-60 MG PO TABS
1.0000 | ORAL_TABLET | ORAL | 0 refills | Status: DC | PRN
Start: 2023-05-31 — End: 2023-06-14

## 2023-05-31 MED ORDER — IBUPROFEN 800 MG PO TABS
800.0000 mg | ORAL_TABLET | Freq: Three times a day (TID) | ORAL | 1 refills | Status: AC | PRN
Start: 2023-05-31 — End: ?

## 2023-05-31 NOTE — Patient Instructions (Signed)
While we are working on your approval for MRI please go ahead and call to schedule your appointment with [facility] within at least one (1) week.   Central Scheduling [phone]  
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
## 2023-06-09 ENCOUNTER — Ambulatory Visit (HOSPITAL_COMMUNITY)
Admission: RE | Admit: 2023-06-09 | Discharge: 2023-06-09 | Disposition: A | Discharge status: Home / Self Care | Payer: MEDICAID | Source: Ambulatory Visit | Attending: Orthopedic Surgery | Admitting: Orthopedic Surgery

## 2023-06-09 DIAGNOSIS — S92015D Nondisplaced fracture of body of left calcaneus, subsequent encounter for fracture with routine healing: Secondary | ICD-10-CM | POA: Insufficient documentation

## 2023-06-14 ENCOUNTER — Other Ambulatory Visit: Payer: Self-pay | Admitting: Orthopedic Surgery

## 2023-06-14 DIAGNOSIS — S93622D Sprain of tarsometatarsal ligament of left foot, subsequent encounter: Secondary | ICD-10-CM

## 2023-06-14 DIAGNOSIS — T148XXA Other injury of unspecified body region, initial encounter: Secondary | ICD-10-CM

## 2023-06-14 DIAGNOSIS — S92015D Nondisplaced fracture of body of left calcaneus, subsequent encounter for fracture with routine healing: Secondary | ICD-10-CM

## 2023-06-14 NOTE — Telephone Encounter (Signed)
Dr. Mort Sawyers pt - spoke w/the patient, he is requesting a refill on Tylenol #4 300-60mg , 30 tablets, Every 4 hours PRN for moderate pain to be sent to [facility] on [address]

## 2023-06-15 MED ORDER — ACETAMINOPHEN-CODEINE 300-60 MG PO TABS
1.0000 | ORAL_TABLET | ORAL | 0 refills | Status: AC | PRN
Start: 2023-06-15 — End: ?

## 2023-06-21 ENCOUNTER — Ambulatory Visit (INDEPENDENT_AMBULATORY_CARE_PROVIDER_SITE_OTHER): Payer: MEDICAID | Admitting: Orthopedic Surgery

## 2023-06-21 ENCOUNTER — Encounter: Payer: Self-pay | Admitting: Orthopedic Surgery

## 2023-06-21 VITALS — BP 135/103 | HR 109

## 2023-06-21 DIAGNOSIS — S93622D Sprain of tarsometatarsal ligament of left foot, subsequent encounter: Secondary | ICD-10-CM

## 2023-06-21 DIAGNOSIS — S92015D Nondisplaced fracture of body of left calcaneus, subsequent encounter for fracture with routine healing: Secondary | ICD-10-CM | POA: Diagnosis not present

## 2023-06-21 DIAGNOSIS — S8265XA Nondisplaced fracture of lateral malleolus of left fibula, initial encounter for closed fracture: Secondary | ICD-10-CM | POA: Diagnosis not present

## 2023-06-21 DIAGNOSIS — G90522 Complex regional pain syndrome I of left lower limb: Secondary | ICD-10-CM | POA: Diagnosis not present

## 2023-06-21 DIAGNOSIS — S92215G Nondisplaced fracture of cuboid bone of left foot, subsequent encounter for fracture with delayed healing: Secondary | ICD-10-CM

## 2023-06-21 DIAGNOSIS — T148XXA Other injury of unspecified body region, initial encounter: Secondary | ICD-10-CM

## 2023-06-21 MED ORDER — GABAPENTIN 400 MG PO CAPS
400.0000 mg | ORAL_CAPSULE | Freq: Three times a day (TID) | ORAL | 0 refills | Status: DC
Start: 2023-06-21 — End: 2023-08-02

## 2023-06-21 MED ORDER — ACETAMINOPHEN-CODEINE 300-60 MG PO TABS
1.0000 | ORAL_TABLET | ORAL | 0 refills | Status: DC | PRN
Start: 2023-06-21 — End: 2023-07-02
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
## 2023-06-28 ENCOUNTER — Other Ambulatory Visit: Payer: Self-pay

## 2023-06-28 ENCOUNTER — Encounter (HOSPITAL_COMMUNITY): Payer: Self-pay | Admitting: *Deleted

## 2023-06-28 ENCOUNTER — Emergency Department (HOSPITAL_COMMUNITY)
Admission: EM | Admit: 2023-06-28 | Discharge: 2023-06-28 | Disposition: A | Discharge status: Home / Self Care | Payer: MEDICAID | Attending: Emergency Medicine | Admitting: Emergency Medicine

## 2023-06-28 DIAGNOSIS — I2510 Atherosclerotic heart disease of native coronary artery without angina pectoris: Secondary | ICD-10-CM | POA: Diagnosis not present

## 2023-06-28 DIAGNOSIS — E119 Type 2 diabetes mellitus without complications: Secondary | ICD-10-CM | POA: Diagnosis not present

## 2023-06-28 DIAGNOSIS — Z7984 Long term (current) use of oral hypoglycemic drugs: Secondary | ICD-10-CM | POA: Diagnosis not present

## 2023-06-28 DIAGNOSIS — I10 Essential (primary) hypertension: Secondary | ICD-10-CM | POA: Diagnosis not present

## 2023-06-28 DIAGNOSIS — L03114 Cellulitis of left upper limb: Secondary | ICD-10-CM | POA: Diagnosis present

## 2023-06-28 LAB — BASIC METABOLIC PANEL
Anion gap: 9 (ref 5–15)
BUN: 10 mg/dL (ref 6–20)
CO2: 24 mmol/L (ref 22–32)
Calcium: 8.6 mg/dL — ABNORMAL LOW (ref 8.9–10.3)
Chloride: 101 mmol/L (ref 98–111)
Creatinine, Ser: 1.01 mg/dL (ref 0.61–1.24)
GFR, Estimated: >60 mL/min (ref >60)
Glucose, Bld: 97 mg/dL (ref 70–99)
Potassium: 3.3 mmol/L — ABNORMAL LOW (ref 3.5–5.1)
Sodium: 134 mmol/L — ABNORMAL LOW (ref 135–145)

## 2023-06-28 LAB — CBC WITH DIFFERENTIAL/PLATELET
Abs Immature Granulocytes: 0.03 10*3/uL (ref 0.00–0.07)
Basophils Absolute: 0 10*3/uL (ref 0.0–0.1)
Basophils Relative: 0 %
Eosinophils Absolute: 0.2 10*3/uL (ref 0.0–0.5)
Eosinophils Relative: 2 %
HCT: 41.3 % (ref 39.0–52.0)
Hemoglobin: 14.8 g/dL (ref 13.0–17.0)
Immature Granulocytes: 0 %
Lymphocytes Relative: 20 %
Lymphs Abs: 1.5 10*3/uL (ref 0.7–4.0)
MCH: 33.9 pg (ref 26.0–34.0)
MCHC: 35.8 g/dL (ref 30.0–36.0)
MCV: 94.5 fL (ref 80.0–100.0)
Monocytes Absolute: 0.6 10*3/uL (ref 0.1–1.0)
Monocytes Relative: 8 %
Neutro Abs: 5.2 10*3/uL (ref 1.7–7.7)
Neutrophils Relative %: 70 %
Platelets: 181 10*3/uL (ref 150–400)
RBC: 4.37 MIL/uL (ref 4.22–5.81)
RDW: 12.5 % (ref 11.5–15.5)
WBC: 7.5 10*3/uL (ref 4.0–10.5)
nRBC: 0 % (ref 0.0–0.2)

## 2023-06-28 LAB — MRSA NEXT GEN BY PCR, NASAL: MRSA by PCR Next Gen: DETECTED — AB

## 2023-06-28 MED ORDER — DOXYCYCLINE HYCLATE 100 MG PO CAPS: 100.0000 mg | ORAL_CAPSULE | Freq: Two times a day (BID) | ORAL | 0 refills | Status: DC

## 2023-06-28 MED ORDER — KETOROLAC TROMETHAMINE 30 MG/ML IJ SOLN
15.0000 mg | Freq: Once | INTRAMUSCULAR | Status: AC
  Administered 2023-06-28: 15 mg via INTRAVENOUS
  Filled 2023-06-28: qty 1

## 2023-06-28 MED ORDER — CLINDAMYCIN PHOSPHATE 600 MG/50ML IV SOLN
600.0000 mg | Freq: Once | INTRAVENOUS | Status: AC
  Administered 2023-06-28: 600 mg via INTRAVENOUS
  Filled 2023-06-28: qty 50

## 2023-06-28 NOTE — ED Notes (Addendum)
Alert, NAD, calm, interactive, restless, pacing, "ready to go". Verbalizes his g/f (also a pt) is up for d/c and he is ready to go.

## 2023-06-28 NOTE — Discharge Instructions (Addendum)
Take entire course of the antibiotics prescribed, your first dose this evening.  Elevation and warm compresses to your forearm can also help resolve this infection quicker.  Get rechecked by your primary doctor or by returning here if you think you are infection is getting worse, worse pain, swelling or spreading redness beyond the first 48 hours as it will take this amount of time for the antibiotic to start working.

## 2023-06-28 NOTE — ED Notes (Signed)
Pt in hallway on cellphone. No needs at this time.

## 2023-06-28 NOTE — ED Triage Notes (Addendum)
Here by POV from home for L arm swelling with pitting edema, redness, and various scattered scant lesions/bites. Reports some drainage. No known insect bites. Works as a Curator on cars. Skin warm and clammy. CMS and ROM intact. Skin intactness questionable. Compartments soft. Pulses strong. LLE in CAM walker.

## 2023-06-28 NOTE — ED Notes (Signed)
Pts IV has been taken out. 

## 2023-06-28 NOTE — ED Notes (Signed)
PT wanted IV out, pt stated he is leaving due to another patient bothering him.

## 2023-06-28 NOTE — ED Notes (Signed)
Pt walking to the ED main entrance and was able to discuss with pt risks of leaving AMA. Pt verbalizes understanding of risks. IV taken out

## 2023-06-28 NOTE — ED Provider Notes (Signed)
[place] EMERGENCY DEPARTMENT AT [facility] Provider Note   CSN: [id number] Arrival date & time: 06/28/23  4098     History {Add pertinent medical, surgical, social history, OB history to HPI:1} Chief Complaint  Patient presents with   Arm Swelling    Jermaine [name] is a 42 y.o. male with a history including type 2 diabetes, hyperlipidemia, hypertension, CAD with a history of MI and infrequent episodes of cellulitis and skin abscesses presenting with pain, redness and swelling of his left lateral forearm which started several days ago.  It started as a small papule which has drained small amounts of clear to yellow fluid.  He has been applying iodine to the skin daily to keep it clean and has been covering it with a Band-Aid.  He has had prior similar infections, most recently seen here in April for an abscess in his left axilla.  Denies fevers or chills, he endorses localized pain at the site only.  He denies IV drug use.  To his knowledge he has been screened for MRSA years ago which was negative.  The history is provided by the patient.       Home Medications Prior to Admission medications   Medication Sig Start Date End Date Taking? Authorizing Provider  acetaminophen-codeine (TYLENOL #4) 300-60 MG tablet Take 1 tablet by mouth every 4 (four) hours as needed for moderate pain. 06/21/23   Vickki Hearing, MD  gabapentin (NEURONTIN) 400 MG capsule Take 1 capsule (400 mg total) by mouth 3 (three) times daily. 06/21/23   Vickki Hearing, MD  ibuprofen (ADVIL) 800 MG tablet Take 1 tablet (800 mg total) by mouth every 8 (eight) hours as needed. 03/15/23   Vickki Hearing, MD  tiZANidine (ZANAFLEX) 4 MG tablet Take 1 tablet (4 mg total) by mouth every 6 (six) hours as needed for muscle spasms. 04/06/23   Vickki Hearing, MD      Allergies    Hydrocodone, Seroquel [quetiapine], and Trazodone and nefazodone    Review of Systems   Review of Systems   Constitutional:  Negative for chills and fever.  HENT:  Negative for congestion.   Eyes: Negative.   Respiratory:  Negative for chest tightness and shortness of breath.   Cardiovascular:  Negative for chest pain.  Gastrointestinal:  Negative for abdominal pain and nausea.  Genitourinary: Negative.   Musculoskeletal:  Negative for arthralgias, joint swelling and neck pain.  Skin:  Positive for color change and wound. Negative for rash.  Neurological:  Negative for dizziness, weakness, light-headedness, numbness and headaches.  Psychiatric/Behavioral: Negative.    All other systems reviewed and are negative.   Physical Exam Updated Vital Signs BP (!) 167/107 (BP Location: Right Arm)   Pulse 94   Temp 98.6 F (37 C) (Oral)   Resp 20   Ht 6\' 2"  (1.88 m)   Wt 113.4 kg   SpO2 98%   BMI 32.10 kg/m  Physical Exam Vitals and nursing note reviewed.  Constitutional:      Appearance: He is well-developed.  HENT:     Head: Normocephalic and atraumatic.  Eyes:     Conjunctiva/sclera: Conjunctivae normal.  Cardiovascular:     Rate and Rhythm: Normal rate and regular rhythm.     Heart sounds: Normal heart sounds.  Pulmonary:     Effort: Pulmonary effort is normal.     Breath sounds: Normal breath sounds. No wheezing.  Abdominal:     General: Bowel sounds are  normal.     Palpations: Abdomen is soft.     Tenderness: There is no abdominal tenderness.  Musculoskeletal:        General: Normal range of motion.     Cervical back: Normal range of motion.  Skin:    General: Skin is warm and dry.  Neurological:     Mental Status: He is alert.     ED Results / Procedures / Treatments   Labs (all labs ordered are listed, but only abnormal results are displayed) Labs Reviewed - No data to display  EKG None  Radiology No results found.  Procedures Procedures  {Document cardiac monitor, telemetry assessment procedure when appropriate:1}  Medications Ordered in ED Medications  - No data to display  ED Course/ Medical Decision Making/ A&P   {   Click here for ABCD2, HEART and other calculatorsREFRESH Note before signing :1}                              Medical Decision Making  ***  {Document critical care time when appropriate:1} {Document review of labs and clinical decision tools ie heart score, Chads2Vasc2 etc:1}  {Document your independent review of radiology images, and any outside records:1} {Document your discussion with family members, caretakers, and with consultants:1} {Document social determinants of health affecting pt's care:1} {Document your decision making why or why not admission, treatments were needed:1} Final Clinical Impression(s) / ED Diagnoses Final diagnoses:  None    Rx / DC Orders ED Discharge Orders     None

## 2023-07-02 ENCOUNTER — Other Ambulatory Visit: Payer: Self-pay | Admitting: Orthopedic Surgery

## 2023-07-02 DIAGNOSIS — T148XXA Other injury of unspecified body region, initial encounter: Secondary | ICD-10-CM

## 2023-07-02 DIAGNOSIS — S93622D Sprain of tarsometatarsal ligament of left foot, subsequent encounter: Secondary | ICD-10-CM

## 2023-07-02 DIAGNOSIS — S92015D Nondisplaced fracture of body of left calcaneus, subsequent encounter for fracture with routine healing: Secondary | ICD-10-CM

## 2023-07-05 ENCOUNTER — Telehealth: Payer: Self-pay | Admitting: Orthopedic Surgery

## 2023-07-05 ENCOUNTER — Encounter: Payer: Self-pay | Admitting: Orthopedic Surgery

## 2023-07-05 MED ORDER — ACETAMINOPHEN-CODEINE 300-60 MG PO TABS
1.0000 | ORAL_TABLET | ORAL | 0 refills | Status: DC | PRN
Start: 2023-07-05 — End: 2023-08-02

## 2023-07-05 NOTE — Telephone Encounter (Signed)
Dr. Mort Sawyers pt - spoke w/the patient, he is requesting a call from Dr. Romeo Apple STAT.  He stated that he also sent a mychart message.  He stated that he doesn't feel like that things in his MRI results were disclosed to him.

## 2023-07-06 ENCOUNTER — Telehealth: Payer: Self-pay | Admitting: Orthopedic Surgery

## 2023-07-06 NOTE — Telephone Encounter (Signed)
-----   Message from Fuller Canada sent at 07/06/2023  7:34 AM EDT ----- Call with mri

## 2023-07-06 NOTE — Telephone Encounter (Signed)
Called went to voice mail.

## 2023-07-06 NOTE — Telephone Encounter (Signed)
CLINICAL DATA:  Foot trauma, Lisfranc injury suspected.   EXAM: MRI OF THE LEFT FOOT WITHOUT CONTRAST   TECHNIQUE: Multiplanar, multisequence MR imaging of the left foot was performed. No intravenous contrast was administered.   COMPARISON:  Left foot radiographs 03/09/2023. Left ankle MRI 05/22/2020.   FINDINGS: Bones/Joint/Cartilage   Large field-of-view imaging was performed, encompassing the entire foot and ankle on the sagittal images. The distal toes are not included on the axial or coronal images. There is multifocal bone marrow edema. There is prominent marrow edema throughout the 2nd metatarsal head and neck without discrete fracture or typical signs of Freiberg infraction. Mild marrow edema is present in the plantar head of the 4th metatarsal. There is a small effusion of the 2nd metatarsophalangeal joint.   Multifocal bone marrow edema throughout the midfoot, involving the navicular, cuneiform bones and cuboid. Corresponding decreased T1 marrow signal is most advanced in the lateral cuneiform and suspicious for a nondisplaced fracture. There is mild edema within the bases of the 2nd through 4th metatarsals. The alignment appears normal at the Lisfranc joint. No acute abnormality identified in the talus.   Small avulsion fracture involving the plantar aspect of the calcaneal tuberosity with associated marrow edema. There is a marrow lesion centrally in the calcaneal body which measures 1.9 x 1.3 cm. This appears well circumscribed with T2 hyperintensity and appears nonaggressive, although is not present on the previous MRI.   No acute osseous findings at the ankle. 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 Mild tibiotalar degenerative changes.   Ligaments   The Lisfranc ligament is slightly indistinct on the long axis images and may be partially torn.   Muscles and Tendons   The ankle tendons appear intact, without significant tenosynovitis. There  is thickening of the plantar fascia adjacent to avulsion fracture of the calcaneal tuberosity. There is edema throughout the lateral extensor musculature of the foot.   Soft tissues   Dorsal subcutaneous edema without focal fluid collection.   IMPRESSION: 1. Multifocal bone marrow edema throughout the midfoot, most advanced in the lateral cuneiform where there is suspected nondisplaced fracture. 2. Possible partial tear of the Lisfranc ligament. The alignment appears normal at the Lisfranc joint. 3. Small avulsion fracture of the plantar aspect of the calcaneal tuberosity with associated marrow edema and thickening of the plantar fascia. 4. Nonspecific marrow edema in the 2nd and 4th metatarsal heads which could reflect bone contusions, stress mediated edema or atypical Freiberg infraction. 5. Indeterminate marrow lesion within the calcaneal body, not present on the previous MRI from 2021. This demonstrates no aggressive characteristics. 6. 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 7. Dorsal forefoot soft tissue swelling and muscular edema.     Electronically Signed   By: Carey Bullocks M.D.   On: 06/18/2023 12:26

## 2023-07-08 ENCOUNTER — Telehealth: Payer: Self-pay | Admitting: Orthopedic Surgery

## 2023-07-08 ENCOUNTER — Encounter: Payer: Self-pay | Admitting: Orthopedic Surgery

## 2023-07-08 NOTE — Telephone Encounter (Signed)
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

## 2023-07-13 ENCOUNTER — Other Ambulatory Visit: Payer: Self-pay | Admitting: Orthopedic Surgery

## 2023-07-13 NOTE — Telephone Encounter (Signed)
Dr. Mort [name] pt - spoke w/the patient, stated that when Dr. Romeo Apple discussed his MRI w/him he told him he would send in Hydrocodone for him to [facility] on [address].  He stated it hasn't been sent in and he's hurting.

## 2023-07-14 MED ORDER — HYDROCODONE-ACETAMINOPHEN 5-325 MG PO TABS: 1.0000 | ORAL_TABLET | Freq: Four times a day (QID) | ORAL | 0 refills | Status: DC | PRN

## 2023-07-17 ENCOUNTER — Emergency Department (HOSPITAL_COMMUNITY)
Admission: EM | Admit: 2023-07-17 | Discharge: 2023-07-17 | Disposition: A | Discharge status: Home / Self Care | Payer: MEDICAID | Attending: Emergency Medicine | Admitting: Emergency Medicine

## 2023-07-17 ENCOUNTER — Other Ambulatory Visit: Payer: Self-pay

## 2023-07-17 ENCOUNTER — Encounter (HOSPITAL_COMMUNITY): Payer: Self-pay

## 2023-07-17 DIAGNOSIS — R519 Headache, unspecified: Secondary | ICD-10-CM | POA: Insufficient documentation

## 2023-07-17 DIAGNOSIS — E119 Type 2 diabetes mellitus without complications: Secondary | ICD-10-CM | POA: Insufficient documentation

## 2023-07-17 DIAGNOSIS — I2510 Atherosclerotic heart disease of native coronary artery without angina pectoris: Secondary | ICD-10-CM | POA: Diagnosis not present

## 2023-07-17 DIAGNOSIS — F17210 Nicotine dependence, cigarettes, uncomplicated: Secondary | ICD-10-CM | POA: Diagnosis not present

## 2023-07-17 DIAGNOSIS — J45909 Unspecified asthma, uncomplicated: Secondary | ICD-10-CM | POA: Insufficient documentation

## 2023-07-17 DIAGNOSIS — R5381 Other malaise: Secondary | ICD-10-CM | POA: Diagnosis not present

## 2023-07-17 DIAGNOSIS — M791 Myalgia, unspecified site: Secondary | ICD-10-CM | POA: Insufficient documentation

## 2023-07-17 DIAGNOSIS — R110 Nausea: Secondary | ICD-10-CM | POA: Insufficient documentation

## 2023-07-17 DIAGNOSIS — I10 Essential (primary) hypertension: Secondary | ICD-10-CM | POA: Diagnosis not present

## 2023-07-17 DIAGNOSIS — R5383 Other fatigue: Secondary | ICD-10-CM | POA: Insufficient documentation

## 2023-07-17 LAB — CBC WITH DIFFERENTIAL/PLATELET
Abs Immature Granulocytes: 0.04 10*3/uL (ref 0.00–0.07)
Basophils Absolute: 0 10*3/uL (ref 0.0–0.1)
Basophils Relative: 0 %
Eosinophils Absolute: 0.2 10*3/uL (ref 0.0–0.5)
Eosinophils Relative: 2 %
HCT: 44.4 % (ref 39.0–52.0)
Hemoglobin: 15.6 g/dL (ref 13.0–17.0)
Immature Granulocytes: 1 %
Lymphocytes Relative: 23 %
Lymphs Abs: 2 10*3/uL (ref 0.7–4.0)
MCH: 33.6 pg (ref 26.0–34.0)
MCHC: 35.1 g/dL (ref 30.0–36.0)
MCV: 95.7 fL (ref 80.0–100.0)
Monocytes Absolute: 0.4 10*3/uL (ref 0.1–1.0)
Monocytes Relative: 5 %
Neutro Abs: 5.9 10*3/uL (ref 1.7–7.7)
Neutrophils Relative %: 69 %
Platelets: 175 10*3/uL (ref 150–400)
RBC: 4.64 MIL/uL (ref 4.22–5.81)
RDW: 12.2 % (ref 11.5–15.5)
WBC: 8.5 10*3/uL (ref 4.0–10.5)
nRBC: 0 % (ref 0.0–0.2)

## 2023-07-17 LAB — COMPREHENSIVE METABOLIC PANEL
ALT: 21 U/L (ref 0–44)
AST: 19 U/L (ref 15–41)
Albumin: 4.1 g/dL (ref 3.5–5.0)
Alkaline Phosphatase: 72 U/L (ref 38–126)
Anion gap: 9 (ref 5–15)
BUN: 12 mg/dL (ref 6–20)
CO2: 28 mmol/L (ref 22–32)
Calcium: 9.2 mg/dL (ref 8.9–10.3)
Chloride: 98 mmol/L (ref 98–111)
Creatinine, Ser: 0.88 mg/dL (ref 0.61–1.24)
GFR, Estimated: >60 mL/min (ref >60)
Glucose, Bld: 97 mg/dL (ref 70–99)
Potassium: 4.3 mmol/L (ref 3.5–5.1)
Sodium: 135 mmol/L (ref 135–145)
Total Bilirubin: 0.3 mg/dL (ref 0.3–1.2)
Total Protein: 6.8 g/dL (ref 6.5–8.1)

## 2023-07-17 LAB — CK: Total CK: 116 U/L (ref 49–397)

## 2023-07-17 MED ORDER — HYDROCODONE-ACETAMINOPHEN 5-325 MG PO TABS
2.0000 | ORAL_TABLET | Freq: Once | ORAL | Status: AC
  Administered 2023-07-17: 2 via ORAL
  Filled 2023-07-17: qty 2

## 2023-07-17 MED ORDER — GABAPENTIN 400 MG PO CAPS
400.0000 mg | ORAL_CAPSULE | Freq: Once | ORAL | Status: AC
  Administered 2023-07-17: 400 mg via ORAL
  Filled 2023-07-17: qty 1

## 2023-07-17 NOTE — ED Provider Notes (Signed)
  Physical Exam  BP (!) 185/114   Pulse 78   Temp (!) 97 F (36.1 C) (Axillary)   Resp 16   Ht 6\' 2"  (1.88 m)   Wt 113.4 kg   SpO2 99%   BMI 32.10 kg/m   Physical Exam Vitals and nursing note reviewed.  HENT:     Head: Normocephalic and atraumatic.  Eyes:     Pupils: Pupils are equal, round, and reactive to light.  Cardiovascular:     Rate and Rhythm: Normal rate and regular rhythm.  Pulmonary:     Effort: Pulmonary effort is normal.     Breath sounds: Normal breath sounds.  Abdominal:     Palpations: Abdomen is soft.     Tenderness: There is no abdominal tenderness.  Skin:    General: Skin is warm and dry.  Neurological:     Mental Status: He is alert.  Psychiatric:        Mood and Affect: Mood normal.     Procedures  Procedures  ED Course / MDM   Clinical Course as of 07/17/23 0923  Sat Jul 17, 2023  0916 Laboratory workup including CBC CMP CK show no acute findings.  Informed patient of these results.  Blood cultures show no growth as preliminary result.  Instructed patient for PCP follow-up.  Replaced lower extremity splint.  Discussed return precautions. [MP]    Clinical Course User Index [MP] Royanne Foots, DO   Medical Decision Making I, Estelle June DO, have assumed care of this patient from the previous provider pending remainder of laboratory workup, reevaluation and disposition  Amount and/or Complexity of Data Reviewed Labs: ordered.  Risk Prescription drug management.   Final diagnosis Headache Nausea       Royanne Foots, DO 07/17/23 1610

## 2023-07-17 NOTE — ED Triage Notes (Signed)
Pov from home. Cc of headache for a couple days with nausea. Also wants his mri explained to him regarding  his foot- has some concerns and questions.  Was recently treated for MRSA and isnt feeling any better.

## 2023-07-17 NOTE — ED Notes (Signed)
Pt was given urinal

## 2023-07-17 NOTE — Discharge Instructions (Addendum)
You were seen in the emergency department for headache and nausea Your blood work looked completely normal here today with no evidence of acute infection Blood cultures show no growth yet with the final report still pending It is important that you follow-up with your primary care doctor within 1 week for reevaluation Continue taking all previous prescribed indications Return to the emerged part for severe pain or any other concerns

## 2023-07-18 ENCOUNTER — Emergency Department (HOSPITAL_COMMUNITY): Payer: MEDICAID

## 2023-07-18 ENCOUNTER — Emergency Department (HOSPITAL_COMMUNITY)
Admission: EM | Admit: 2023-07-18 | Discharge: 2023-07-18 | Disposition: A | Discharge status: Home / Self Care | Payer: MEDICAID | Attending: Emergency Medicine | Admitting: Emergency Medicine

## 2023-07-18 ENCOUNTER — Encounter (HOSPITAL_COMMUNITY): Payer: Self-pay | Admitting: Emergency Medicine

## 2023-07-18 ENCOUNTER — Other Ambulatory Visit: Payer: Self-pay

## 2023-07-18 DIAGNOSIS — S52614A Nondisplaced fracture of right ulna styloid process, initial encounter for closed fracture: Secondary | ICD-10-CM | POA: Diagnosis not present

## 2023-07-18 DIAGNOSIS — R569 Unspecified convulsions: Secondary | ICD-10-CM | POA: Diagnosis present

## 2023-07-18 LAB — BASIC METABOLIC PANEL
Anion gap: 12 (ref 5–15)
BUN: 13 mg/dL (ref 6–20)
CO2: 23 mmol/L (ref 22–32)
Calcium: 8.6 mg/dL — ABNORMAL LOW (ref 8.9–10.3)
Chloride: 100 mmol/L (ref 98–111)
Creatinine, Ser: 0.88 mg/dL (ref 0.61–1.24)
GFR, Estimated: >60 mL/min (ref >60)
Glucose, Bld: 124 mg/dL — ABNORMAL HIGH (ref 70–99)
Potassium: 3.4 mmol/L — ABNORMAL LOW (ref 3.5–5.1)
Sodium: 135 mmol/L (ref 135–145)

## 2023-07-18 LAB — CBC
HCT: 43.6 % (ref 39.0–52.0)
Hemoglobin: 15.6 g/dL (ref 13.0–17.0)
MCH: 33.7 pg (ref 26.0–34.0)
MCHC: 35.8 g/dL (ref 30.0–36.0)
MCV: 94.2 fL (ref 80.0–100.0)
Platelets: 180 10*3/uL (ref 150–400)
RBC: 4.63 MIL/uL (ref 4.22–5.81)
RDW: 12.4 % (ref 11.5–15.5)
WBC: 11.2 10*3/uL — ABNORMAL HIGH (ref 4.0–10.5)
nRBC: 0 % (ref 0.0–0.2)

## 2023-07-18 LAB — CBG MONITORING, ED: Glucose-Capillary: 143 mg/dL — ABNORMAL HIGH (ref 70–99)
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
## 2023-07-18 NOTE — ED Notes (Signed)
Pt states body is hurting all over and head is pounding. No meds today

## 2023-07-22 LAB — CULTURE, BLOOD (ROUTINE X 2)
Culture: NO GROWTH
Culture: NO GROWTH
Special Requests: ADEQUATE
Special Requests: ADEQUATE

## 2023-07-27 ENCOUNTER — Other Ambulatory Visit: Payer: Self-pay | Admitting: Orthopedic Surgery

## 2023-07-27 MED ORDER — HYDROCODONE-ACETAMINOPHEN 5-325 MG PO TABS: 1.0000 | ORAL_TABLET | Freq: Four times a day (QID) | ORAL | 0 refills | Status: DC | PRN
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
## 2023-07-30 ENCOUNTER — Other Ambulatory Visit: Payer: Self-pay | Admitting: Orthopedic Surgery

## 2023-07-30 DIAGNOSIS — G90522 Complex regional pain syndrome I of left lower limb: Secondary | ICD-10-CM

## 2023-08-02 ENCOUNTER — Other Ambulatory Visit: Payer: Self-pay

## 2023-08-02 ENCOUNTER — Inpatient Hospital Stay (HOSPITAL_COMMUNITY)
Admission: EM | Admit: 2023-08-02 | Discharge: 2023-08-05 | DRG: 917 | Disposition: A | Discharge status: Home / Self Care | Payer: MEDICAID | Attending: Internal Medicine | Admitting: Internal Medicine

## 2023-08-02 ENCOUNTER — Ambulatory Visit (INDEPENDENT_AMBULATORY_CARE_PROVIDER_SITE_OTHER): Payer: MEDICAID | Admitting: Orthopedic Surgery

## 2023-08-02 ENCOUNTER — Emergency Department (HOSPITAL_COMMUNITY): Payer: MEDICAID

## 2023-08-02 ENCOUNTER — Other Ambulatory Visit (INDEPENDENT_AMBULATORY_CARE_PROVIDER_SITE_OTHER): Payer: MEDICAID

## 2023-08-02 ENCOUNTER — Encounter (HOSPITAL_COMMUNITY): Payer: Self-pay | Admitting: Emergency Medicine

## 2023-08-02 DIAGNOSIS — I5021 Acute systolic (congestive) heart failure: Secondary | ICD-10-CM

## 2023-08-02 DIAGNOSIS — T426X6A Underdosing of other antiepileptic and sedative-hypnotic drugs, initial encounter: Secondary | ICD-10-CM | POA: Diagnosis present

## 2023-08-02 DIAGNOSIS — G9349 Other encephalopathy: Secondary | ICD-10-CM | POA: Diagnosis present

## 2023-08-02 DIAGNOSIS — I427 Cardiomyopathy due to drug and external agent: Secondary | ICD-10-CM | POA: Diagnosis present

## 2023-08-02 DIAGNOSIS — Z716 Tobacco abuse counseling: Secondary | ICD-10-CM

## 2023-08-02 DIAGNOSIS — F1910 Other psychoactive substance abuse, uncomplicated: Secondary | ICD-10-CM | POA: Diagnosis present

## 2023-08-02 DIAGNOSIS — F603 Borderline personality disorder: Secondary | ICD-10-CM | POA: Diagnosis present

## 2023-08-02 DIAGNOSIS — N179 Acute kidney failure, unspecified: Secondary | ICD-10-CM | POA: Diagnosis present

## 2023-08-02 DIAGNOSIS — E119 Type 2 diabetes mellitus without complications: Secondary | ICD-10-CM

## 2023-08-02 DIAGNOSIS — Z91199 Patient's noncompliance with other medical treatment and regimen due to unspecified reason: Secondary | ICD-10-CM

## 2023-08-02 DIAGNOSIS — G90522 Complex regional pain syndrome I of left lower limb: Secondary | ICD-10-CM

## 2023-08-02 DIAGNOSIS — T405X1A Poisoning by cocaine, accidental (unintentional), initial encounter: Secondary | ICD-10-CM | POA: Diagnosis present

## 2023-08-02 DIAGNOSIS — I480 Paroxysmal atrial fibrillation: Secondary | ICD-10-CM | POA: Diagnosis present

## 2023-08-02 DIAGNOSIS — E1165 Type 2 diabetes mellitus with hyperglycemia: Secondary | ICD-10-CM | POA: Diagnosis present

## 2023-08-02 DIAGNOSIS — K219 Gastro-esophageal reflux disease without esophagitis: Secondary | ICD-10-CM | POA: Diagnosis present

## 2023-08-02 DIAGNOSIS — Z91128 Patient's intentional underdosing of medication regimen for other reason: Secondary | ICD-10-CM

## 2023-08-02 DIAGNOSIS — G8928 Other chronic postprocedural pain: Secondary | ICD-10-CM

## 2023-08-02 DIAGNOSIS — R4182 Altered mental status, unspecified: Secondary | ICD-10-CM | POA: Insufficient documentation

## 2023-08-02 DIAGNOSIS — F419 Anxiety disorder, unspecified: Secondary | ICD-10-CM | POA: Diagnosis present

## 2023-08-02 DIAGNOSIS — F319 Bipolar disorder, unspecified: Secondary | ICD-10-CM | POA: Diagnosis present

## 2023-08-02 DIAGNOSIS — E785 Hyperlipidemia, unspecified: Secondary | ICD-10-CM | POA: Diagnosis present

## 2023-08-02 DIAGNOSIS — Z823 Family history of stroke: Secondary | ICD-10-CM

## 2023-08-02 DIAGNOSIS — I214 Non-ST elevation (NSTEMI) myocardial infarction: Principal | ICD-10-CM | POA: Insufficient documentation

## 2023-08-02 DIAGNOSIS — I4891 Unspecified atrial fibrillation: Secondary | ICD-10-CM | POA: Diagnosis present

## 2023-08-02 DIAGNOSIS — I428 Other cardiomyopathies: Secondary | ICD-10-CM | POA: Diagnosis not present

## 2023-08-02 DIAGNOSIS — Z1152 Encounter for screening for COVID-19: Secondary | ICD-10-CM

## 2023-08-02 DIAGNOSIS — Z8673 Personal history of transient ischemic attack (TIA), and cerebral infarction without residual deficits: Secondary | ICD-10-CM

## 2023-08-02 DIAGNOSIS — I2510 Atherosclerotic heart disease of native coronary artery without angina pectoris: Secondary | ICD-10-CM | POA: Diagnosis present

## 2023-08-02 DIAGNOSIS — S92902S Unspecified fracture of left foot, sequela: Secondary | ICD-10-CM

## 2023-08-02 DIAGNOSIS — S92015D Nondisplaced fracture of body of left calcaneus, subsequent encounter for fracture with routine healing: Secondary | ICD-10-CM

## 2023-08-02 DIAGNOSIS — F17210 Nicotine dependence, cigarettes, uncomplicated: Secondary | ICD-10-CM | POA: Diagnosis present

## 2023-08-02 DIAGNOSIS — S93622D Sprain of tarsometatarsal ligament of left foot, subsequent encounter: Secondary | ICD-10-CM

## 2023-08-02 DIAGNOSIS — M25521 Pain in right elbow: Secondary | ICD-10-CM

## 2023-08-02 DIAGNOSIS — S52614A Nondisplaced fracture of right ulna styloid process, initial encounter for closed fracture: Secondary | ICD-10-CM

## 2023-08-02 DIAGNOSIS — D72829 Elevated white blood cell count, unspecified: Secondary | ICD-10-CM | POA: Diagnosis present

## 2023-08-02 DIAGNOSIS — G40909 Epilepsy, unspecified, not intractable, without status epilepticus: Secondary | ICD-10-CM | POA: Diagnosis present

## 2023-08-02 DIAGNOSIS — F1410 Cocaine abuse, uncomplicated: Secondary | ICD-10-CM | POA: Diagnosis present

## 2023-08-02 DIAGNOSIS — F1210 Cannabis abuse, uncomplicated: Secondary | ICD-10-CM | POA: Diagnosis present

## 2023-08-02 DIAGNOSIS — Z885 Allergy status to narcotic agent status: Secondary | ICD-10-CM

## 2023-08-02 DIAGNOSIS — S8265XD Nondisplaced fracture of lateral malleolus of left fibula, subsequent encounter for closed fracture with routine healing: Secondary | ICD-10-CM

## 2023-08-02 DIAGNOSIS — Z720 Tobacco use: Secondary | ICD-10-CM | POA: Diagnosis present

## 2023-08-02 DIAGNOSIS — Z833 Family history of diabetes mellitus: Secondary | ICD-10-CM

## 2023-08-02 DIAGNOSIS — S8265XA Nondisplaced fracture of lateral malleolus of left fibula, initial encounter for closed fracture: Secondary | ICD-10-CM

## 2023-08-02 DIAGNOSIS — R7989 Other specified abnormal findings of blood chemistry: Secondary | ICD-10-CM | POA: Diagnosis not present

## 2023-08-02 DIAGNOSIS — Z560 Unemployment, unspecified: Secondary | ICD-10-CM

## 2023-08-02 DIAGNOSIS — Z8249 Family history of ischemic heart disease and other diseases of the circulatory system: Secondary | ICD-10-CM

## 2023-08-02 DIAGNOSIS — I10 Essential (primary) hypertension: Secondary | ICD-10-CM | POA: Diagnosis present

## 2023-08-02 DIAGNOSIS — I21A1 Myocardial infarction type 2: Secondary | ICD-10-CM | POA: Diagnosis present

## 2023-08-02 DIAGNOSIS — J45909 Unspecified asthma, uncomplicated: Secondary | ICD-10-CM | POA: Diagnosis present

## 2023-08-02 DIAGNOSIS — I63442 Cerebral infarction due to embolism of left cerebellar artery: Secondary | ICD-10-CM | POA: Diagnosis present

## 2023-08-02 DIAGNOSIS — G894 Chronic pain syndrome: Secondary | ICD-10-CM | POA: Diagnosis not present

## 2023-08-02 DIAGNOSIS — G8929 Other chronic pain: Secondary | ICD-10-CM

## 2023-08-02 DIAGNOSIS — G9341 Metabolic encephalopathy: Secondary | ICD-10-CM | POA: Diagnosis present

## 2023-08-02 DIAGNOSIS — Z5986 Financial insecurity: Secondary | ICD-10-CM

## 2023-08-02 DIAGNOSIS — S92215G Nondisplaced fracture of cuboid bone of left foot, subsequent encounter for fracture with delayed healing: Secondary | ICD-10-CM | POA: Diagnosis not present

## 2023-08-02 DIAGNOSIS — I252 Old myocardial infarction: Secondary | ICD-10-CM

## 2023-08-02 DIAGNOSIS — Z79899 Other long term (current) drug therapy: Secondary | ICD-10-CM

## 2023-08-02 DIAGNOSIS — Z888 Allergy status to other drugs, medicaments and biological substances status: Secondary | ICD-10-CM

## 2023-08-02 DIAGNOSIS — I639 Cerebral infarction, unspecified: Secondary | ICD-10-CM | POA: Diagnosis not present

## 2023-08-02 DIAGNOSIS — Z800 Family history of malignant neoplasm of digestive organs: Secondary | ICD-10-CM

## 2023-08-02 LAB — COMPREHENSIVE METABOLIC PANEL
ALT: 34 U/L (ref 0–44)
AST: 47 U/L — ABNORMAL HIGH (ref 15–41)
Albumin: 4 g/dL (ref 3.5–5.0)
Alkaline Phosphatase: 91 U/L (ref 38–126)
Anion gap: 18 — ABNORMAL HIGH (ref 5–15)
BUN: 16 mg/dL (ref 6–20)
CO2: 19 mmol/L — ABNORMAL LOW (ref 22–32)
Calcium: 8.9 mg/dL (ref 8.9–10.3)
Chloride: 100 mmol/L (ref 98–111)
Creatinine, Ser: 1.72 mg/dL — ABNORMAL HIGH (ref 0.61–1.24)
GFR, Estimated: 50 mL/min — ABNORMAL LOW (ref >60)
Glucose, Bld: 291 mg/dL — ABNORMAL HIGH (ref 70–99)
Potassium: 4.5 mmol/L (ref 3.5–5.1)
Sodium: 137 mmol/L (ref 135–145)
Total Bilirubin: 0.2 mg/dL (ref <1.2)
Total Protein: 7 g/dL (ref 6.5–8.1)

## 2023-08-02 LAB — URINALYSIS, ROUTINE W REFLEX MICROSCOPIC
Bilirubin Urine: NEGATIVE
Glucose, UA: >=500 mg/dL — AB
Ketones, ur: NEGATIVE mg/dL
Leukocytes,Ua: NEGATIVE
Nitrite: NEGATIVE
Protein, ur: 100 mg/dL — AB
Specific Gravity, Urine: 1.014 (ref 1.005–1.030)
pH: 5 (ref 5.0–8.0)

## 2023-08-02 LAB — CBC WITH DIFFERENTIAL/PLATELET
Abs Immature Granulocytes: 1.02 10*3/uL — ABNORMAL HIGH (ref 0.00–0.07)
Basophils Absolute: 0.2 10*3/uL — ABNORMAL HIGH (ref 0.0–0.1)
Basophils Relative: 1 %
Eosinophils Absolute: 0.1 10*3/uL (ref 0.0–0.5)
Eosinophils Relative: 0 %
HCT: 46.7 % (ref 39.0–52.0)
Hemoglobin: 15.9 g/dL (ref 13.0–17.0)
Immature Granulocytes: 4 %
Lymphocytes Relative: 4 %
Lymphs Abs: 1 10*3/uL (ref 0.7–4.0)
MCH: 34.1 pg — ABNORMAL HIGH (ref 26.0–34.0)
MCHC: 34 g/dL (ref 30.0–36.0)
MCV: 100.2 fL — ABNORMAL HIGH (ref 80.0–100.0)
Monocytes Absolute: 0.4 10*3/uL (ref 0.1–1.0)
Monocytes Relative: 1 %
Neutro Abs: 26.7 10*3/uL — ABNORMAL HIGH (ref 1.7–7.7)
Neutrophils Relative %: 90 %
Platelets: 210 10*3/uL (ref 150–400)
RBC: 4.66 MIL/uL (ref 4.22–5.81)
RDW: 12.8 % (ref 11.5–15.5)
WBC: 29.4 10*3/uL — ABNORMAL HIGH (ref 4.0–10.5)
nRBC: 0 % (ref 0.0–0.2)

## 2023-08-02 LAB — RAPID URINE DRUG SCREEN, HOSP PERFORMED
Amphetamines: NOT DETECTED
Barbiturates: NOT DETECTED
Benzodiazepines: NOT DETECTED
Cocaine: POSITIVE — AB
Opiates: POSITIVE — AB
Tetrahydrocannabinol: POSITIVE — AB

## 2023-08-02 LAB — CBG MONITORING, ED: Glucose-Capillary: 395 mg/dL — ABNORMAL HIGH (ref 70–99)

## 2023-08-02 LAB — ETHANOL: Alcohol, Ethyl (B): <10 mg/dL (ref <10)

## 2023-08-02 LAB — TROPONIN I (HIGH SENSITIVITY)
Troponin I (High Sensitivity): 781 ng/L (ref <18)
Troponin I (High Sensitivity): 2023 ng/L (ref <18)

## 2023-08-02 LAB — PROCALCITONIN: Procalcitonin: 0.22 ng/mL

## 2023-08-02 MED ORDER — SODIUM CHLORIDE 0.9 % IV BOLUS
1000.0000 mL | Freq: Once | INTRAVENOUS | Status: AC
  Administered 2023-08-02: 1000 mL via INTRAVENOUS

## 2023-08-02 MED ORDER — DILTIAZEM HCL-DEXTROSE 125-5 MG/125ML-% IV SOLN (PREMIX)
5.0000 mg/h | INTRAVENOUS | Status: DC
  Administered 2023-08-02: 5 mg/h via INTRAVENOUS
  Filled 2023-08-02 (×2): qty 125

## 2023-08-02 MED ORDER — HYDROCODONE-ACETAMINOPHEN 5-325 MG PO TABS: 1.0000 | ORAL_TABLET | Freq: Four times a day (QID) | ORAL | 0 refills | Status: DC | PRN

## 2023-08-02 MED ORDER — GABAPENTIN 400 MG PO CAPS: 400.0000 mg | ORAL_CAPSULE | Freq: Three times a day (TID) | ORAL | 0 refills | Status: DC

## 2023-08-02 MED ORDER — DILTIAZEM HCL 25 MG/5ML IV SOLN
10.0000 mg | Freq: Once | INTRAVENOUS | Status: AC
  Administered 2023-08-02: 10 mg via INTRAVENOUS
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
## 2023-08-02 NOTE — ED Notes (Addendum)
Pt to CT on monitor at this time with primary RN.
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
## 2023-08-02 NOTE — ED Notes (Signed)
Pt back from CT - call bell within reach. Tolerated scan.
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
## 2023-08-02 NOTE — ED Notes (Signed)
Ok'd per EDP for patient to have ice chips.

## 2023-08-02 NOTE — ED Notes (Signed)
Pt placed on 2L  due to hx of Sleep apnea, per patient. O2 sats improved from 87-89% to 96%.

## 2023-08-03 ENCOUNTER — Other Ambulatory Visit (HOSPITAL_COMMUNITY): Payer: MEDICAID

## 2023-08-03 ENCOUNTER — Inpatient Hospital Stay (HOSPITAL_COMMUNITY): Payer: MEDICAID

## 2023-08-03 ENCOUNTER — Other Ambulatory Visit (HOSPITAL_COMMUNITY): Payer: Self-pay | Admitting: *Deleted

## 2023-08-03 DIAGNOSIS — I214 Non-ST elevation (NSTEMI) myocardial infarction: Secondary | ICD-10-CM | POA: Diagnosis not present

## 2023-08-03 DIAGNOSIS — R4182 Altered mental status, unspecified: Secondary | ICD-10-CM | POA: Diagnosis not present

## 2023-08-03 DIAGNOSIS — D72829 Elevated white blood cell count, unspecified: Secondary | ICD-10-CM | POA: Insufficient documentation

## 2023-08-03 DIAGNOSIS — Z720 Tobacco use: Secondary | ICD-10-CM

## 2023-08-03 DIAGNOSIS — F1910 Other psychoactive substance abuse, uncomplicated: Secondary | ICD-10-CM | POA: Diagnosis not present

## 2023-08-03 DIAGNOSIS — I4891 Unspecified atrial fibrillation: Secondary | ICD-10-CM | POA: Diagnosis not present

## 2023-08-03 DIAGNOSIS — I639 Cerebral infarction, unspecified: Secondary | ICD-10-CM | POA: Diagnosis not present

## 2023-08-03 DIAGNOSIS — G8929 Other chronic pain: Secondary | ICD-10-CM | POA: Insufficient documentation

## 2023-08-03 DIAGNOSIS — G894 Chronic pain syndrome: Secondary | ICD-10-CM

## 2023-08-03 DIAGNOSIS — E119 Type 2 diabetes mellitus without complications: Secondary | ICD-10-CM

## 2023-08-03 LAB — COMPREHENSIVE METABOLIC PANEL
ALT: 40 U/L (ref 0–44)
AST: 85 U/L — ABNORMAL HIGH (ref 15–41)
Albumin: 3.6 g/dL (ref 3.5–5.0)
Alkaline Phosphatase: 61 U/L (ref 38–126)
Anion gap: 11 (ref 5–15)
BUN: 17 mg/dL (ref 6–20)
CO2: 20 mmol/L — ABNORMAL LOW (ref 22–32)
Calcium: 7.9 mg/dL — ABNORMAL LOW (ref 8.9–10.3)
Chloride: 105 mmol/L (ref 98–111)
Creatinine, Ser: 1.21 mg/dL (ref 0.61–1.24)
GFR, Estimated: >60 mL/min (ref >60)
Glucose, Bld: 74 mg/dL (ref 70–99)
Potassium: 4.6 mmol/L (ref 3.5–5.1)
Sodium: 136 mmol/L (ref 135–145)
Total Bilirubin: 0.4 mg/dL (ref <1.2)
Total Protein: 6.2 g/dL — ABNORMAL LOW (ref 6.5–8.1)

## 2023-08-03 LAB — CBG MONITORING, ED
Glucose-Capillary: 100 mg/dL — ABNORMAL HIGH (ref 70–99)
Glucose-Capillary: 110 mg/dL — ABNORMAL HIGH (ref 70–99)
Glucose-Capillary: 117 mg/dL — ABNORMAL HIGH (ref 70–99)
Glucose-Capillary: 71 mg/dL (ref 70–99)

## 2023-08-03 LAB — CBC WITH DIFFERENTIAL/PLATELET
Abs Immature Granulocytes: 0.56 10*3/uL — ABNORMAL HIGH (ref 0.00–0.07)
Basophils Absolute: 0.1 10*3/uL (ref 0.0–0.1)
Basophils Relative: 0 %
Eosinophils Absolute: 0 10*3/uL (ref 0.0–0.5)
Eosinophils Relative: 0 %
HCT: 41.5 % (ref 39.0–52.0)
Hemoglobin: 14.5 g/dL (ref 13.0–17.0)
Immature Granulocytes: 2 %
Lymphocytes Relative: 5 %
Lymphs Abs: 1.2 10*3/uL (ref 0.7–4.0)
MCH: 34.2 pg — ABNORMAL HIGH (ref 26.0–34.0)
MCHC: 34.9 g/dL (ref 30.0–36.0)
MCV: 97.9 fL (ref 80.0–100.0)
Monocytes Absolute: 1.4 10*3/uL — ABNORMAL HIGH (ref 0.1–1.0)
Monocytes Relative: 6 %
Neutro Abs: 22.4 10*3/uL — ABNORMAL HIGH (ref 1.7–7.7)
Neutrophils Relative %: 87 %
Platelets: 178 10*3/uL (ref 150–400)
RBC: 4.24 MIL/uL (ref 4.22–5.81)
RDW: 12.9 % (ref 11.5–15.5)
WBC: 25.7 10*3/uL — ABNORMAL HIGH (ref 4.0–10.5)
nRBC: 0.1 % (ref 0.0–0.2)

## 2023-08-03 LAB — SARS CORONAVIRUS 2 BY RT PCR: SARS Coronavirus 2 by RT PCR: NEGATIVE

## 2023-08-03 LAB — ECHOCARDIOGRAM COMPLETE
AR max vel: 2.86 cm2
AV Area VTI: 2.94 cm2
AV Area mean vel: 2.85 cm2
AV Mean grad: 3.7 mm[Hg]
AV Peak grad: 6.9 mm[Hg]
Ao pk vel: 1.31 m/s
Area-P 1/2: 4.15 cm2
Calc EF: 34.5 %
Height: 74 in
S' Lateral: 4.8 cm
Single Plane A2C EF: 34.8 %
Single Plane A4C EF: 34.3 %
Weight: 3421.54 [oz_av]

## 2023-08-03 LAB — TROPONIN I (HIGH SENSITIVITY)
Troponin I (High Sensitivity): 6410 ng/L (ref <18)
Troponin I (High Sensitivity): 10306 ng/L (ref <18)

## 2023-08-03 LAB — TSH: TSH: 0.751 u[IU]/mL (ref 0.350–4.500)

## 2023-08-03 LAB — GLUCOSE, CAPILLARY
Glucose-Capillary: 114 mg/dL — ABNORMAL HIGH (ref 70–99)
Glucose-Capillary: 102 mg/dL — ABNORMAL HIGH (ref 70–99)
Glucose-Capillary: 142 mg/dL — ABNORMAL HIGH (ref 70–99)
Glucose-Capillary: 139 mg/dL — ABNORMAL HIGH (ref 70–99)

## 2023-08-03 LAB — MAGNESIUM: Magnesium: 1.7 mg/dL (ref 1.7–2.4)

## 2023-08-03 LAB — HEPARIN LEVEL (UNFRACTIONATED)
Heparin Unfractionated: 0.34 [IU]/mL (ref 0.30–0.70)
Heparin Unfractionated: 0.2 [IU]/mL — ABNORMAL LOW (ref 0.30–0.70)
Heparin Unfractionated: 0.32 [IU]/mL (ref 0.30–0.70)

## 2023-08-03 LAB — HEMOGLOBIN A1C
Hgb A1c MFr Bld: 5.2 % (ref 4.8–5.6)
Mean Plasma Glucose: 102.54 mg/dL

## 2023-08-03 LAB — HIV ANTIBODY (ROUTINE TESTING W REFLEX): HIV Screen 4th Generation wRfx: NONREACTIVE

## 2023-08-03 MED ORDER — ONDANSETRON HCL 4 MG/2ML IJ SOLN
4.0000 mg | Freq: Four times a day (QID) | INTRAMUSCULAR | Status: DC | PRN
  Administered 2023-08-03: 4 mg via INTRAVENOUS
  Filled 2023-08-03: qty 2

## 2023-08-03 MED ORDER — MORPHINE SULFATE (PF) 2 MG/ML IV SOLN
2.0000 mg | INTRAVENOUS | Status: DC | PRN
  Administered 2023-08-03 – 2023-08-05 (×16): 2 mg via INTRAVENOUS
  Filled 2023-08-03 (×16): qty 1

## 2023-08-03 MED ORDER — ACETAMINOPHEN 650 MG RE SUPP: 650.0000 mg | Freq: Four times a day (QID) | RECTAL | Status: DC | PRN

## 2023-08-03 MED ORDER — ONDANSETRON HCL 4 MG PO TABS: 4.0000 mg | ORAL_TABLET | Freq: Four times a day (QID) | ORAL | Status: DC | PRN

## 2023-08-03 MED ORDER — NICOTINE 14 MG/24HR TD PT24
14.0000 mg | MEDICATED_PATCH | Freq: Every day | TRANSDERMAL | Status: DC
  Administered 2023-08-03 – 2023-08-05 (×3): 14 mg via TRANSDERMAL
  Filled 2023-08-03 (×3): qty 1

## 2023-08-03 MED ORDER — HEPARIN BOLUS VIA INFUSION
4000.0000 [IU] | Freq: Once | INTRAVENOUS | Status: AC
  Administered 2023-08-03: 4000 [IU] via INTRAVENOUS

## 2023-08-03 MED ORDER — SODIUM CHLORIDE 0.9 % WEIGHT BASED INFUSION
1.0000 mL/kg/h | INTRAVENOUS | Status: DC
  Administered 2023-08-04 (×2): 1 mL/kg/h via INTRAVENOUS

## 2023-08-03 MED ORDER — ACETAMINOPHEN 325 MG PO TABS
650.0000 mg | ORAL_TABLET | Freq: Four times a day (QID) | ORAL | Status: DC | PRN
  Administered 2023-08-04 – 2023-08-05 (×2): 650 mg via ORAL
  Filled 2023-08-03 (×3): qty 2

## 2023-08-03 MED ORDER — ASPIRIN 81 MG PO TBEC: 81.0000 mg | DELAYED_RELEASE_TABLET | Freq: Every day | ORAL | Status: DC

## 2023-08-03 MED ORDER — GABAPENTIN 400 MG PO CAPS
400.0000 mg | ORAL_CAPSULE | Freq: Three times a day (TID) | ORAL | Status: DC
  Administered 2023-08-03 – 2023-08-05 (×7): 400 mg via ORAL
  Filled 2023-08-03 (×7): qty 1

## 2023-08-03 MED ORDER — HEPARIN (PORCINE) 25000 UT/250ML-% IV SOLN
1550.0000 [IU]/h | INTRAVENOUS | Status: DC
  Administered 2023-08-03: 1100 [IU]/h via INTRAVENOUS
  Administered 2023-08-04 – 2023-08-05 (×2): 1450 [IU]/h via INTRAVENOUS
  Filled 2023-08-03 (×4): qty 250

## 2023-08-03 MED ORDER — HEPARIN BOLUS VIA INFUSION
1000.0000 [IU] | Freq: Once | INTRAVENOUS | Status: AC
  Administered 2023-08-03: 1000 [IU] via INTRAVENOUS
  Filled 2023-08-03: qty 1000

## 2023-08-03 MED ORDER — STROKE: EARLY STAGES OF RECOVERY BOOK
Freq: Once | Status: AC
  Filled 2023-08-03: qty 1

## 2023-08-03 MED ORDER — DICLOFENAC SODIUM 1 % EX GEL
2.0000 g | Freq: Four times a day (QID) | CUTANEOUS | Status: DC
  Administered 2023-08-03 – 2023-08-05 (×3): 2 g via TOPICAL
  Filled 2023-08-03: qty 100

## 2023-08-03 MED ORDER — ASPIRIN 81 MG PO CHEW
81.0000 mg | CHEWABLE_TABLET | ORAL | Status: AC
  Administered 2023-08-04: 81 mg via ORAL
  Filled 2023-08-03: qty 1

## 2023-08-03 MED ORDER — OXYCODONE HCL 5 MG PO TABS
5.0000 mg | ORAL_TABLET | ORAL | Status: DC | PRN
  Administered 2023-08-03 – 2023-08-05 (×6): 5 mg via ORAL
  Filled 2023-08-03 (×5): qty 1

## 2023-08-03 MED ORDER — INSULIN ASPART 100 UNIT/ML IJ SOLN
0.0000 [IU] | INTRAMUSCULAR | Status: DC
  Administered 2023-08-03 (×2): 2 [IU] via SUBCUTANEOUS

## 2023-08-03 MED ORDER — CARVEDILOL 3.125 MG PO TABS
3.1250 mg | ORAL_TABLET | Freq: Two times a day (BID) | ORAL | Status: DC
  Administered 2023-08-03 – 2023-08-04 (×4): 3.125 mg via ORAL
  Filled 2023-08-03 (×4): qty 1

## 2023-08-03 MED ORDER — ROSUVASTATIN CALCIUM 20 MG PO TABS
40.0000 mg | ORAL_TABLET | Freq: Every day | ORAL | Status: DC
  Administered 2023-08-03 – 2023-08-05 (×2): 40 mg via ORAL
  Filled 2023-08-03 (×3): qty 2

## 2023-08-03 MED ORDER — ASPIRIN 81 MG PO TBEC
81.0000 mg | DELAYED_RELEASE_TABLET | Freq: Every day | ORAL | Status: DC
  Administered 2023-08-03: 81 mg via ORAL
  Filled 2023-08-03: qty 1

## 2023-08-03 MED ORDER — SODIUM CHLORIDE 0.9 % WEIGHT BASED INFUSION
3.0000 mL/kg/h | INTRAVENOUS | Status: AC
  Administered 2023-08-04: 3 mL/kg/h via INTRAVENOUS

## 2023-08-03 NOTE — Assessment & Plan Note (Signed)
-   Glucose up to 395 - Sliding scale coverage - Patient reports taking no medications for this at home - Continue to monitor

## 2023-08-03 NOTE — Assessment & Plan Note (Signed)
Continue gabapentin.

## 2023-08-03 NOTE — Assessment & Plan Note (Signed)
-   Patient's troponin has increased from 781>> 2023>> 6400 - Cardiology informed and request admission over to [facility] - Patient is waiting on a progressive bed - She is on diltiazem drip due to A-fib RVR - She is on heparin drip for NSTEMI - Chest x-ray done at 9:10 PM is still not resulted - Continue to monitor

## 2023-08-03 NOTE — ED Notes (Signed)
Pts Fiance provided an update - she is requesting to be updated when the patient gets a room in the hospital. Contact number [phone].
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
## 2023-08-03 NOTE — Assessment & Plan Note (Signed)
-   Reports half a pack per day - Nicotine patch ordered - Continue to monitor
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
## 2023-08-03 NOTE — Progress Notes (Signed)
*  PRELIMINARY RESULTS* Echocardiogram 2D Echocardiogram has been performed.  Jermaine [name] 08/03/2023, 12:33 PM

## 2023-08-03 NOTE — Assessment & Plan Note (Signed)
-   Leukocytosis of 29.4 - Likely acute phase reactant - Procalcitonin 0.22 - No infectious symptoms - Continue to monitor
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
## 2023-08-03 NOTE — ED Notes (Signed)
Hospitalist at the bedside 
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
## 2023-08-03 NOTE — ED Notes (Signed)
Lab at the bedside at this time to obtain trop level. Per lab the earlier obtainment had hemolyzed so there was a need for recollect. Pts heparin level obtained at this time as well.
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
## 2023-08-03 NOTE — Assessment & Plan Note (Signed)
-   UDS is positive for opiates, cocaine, THC - Patient reports opiates are prescribed - Reports last use of cocaine was on 10 November - Counseled on importance of cessation especially given cardiac health - Continue to monitor

## 2023-08-03 NOTE — Assessment & Plan Note (Signed)
-   EKG shows a heart rate of 133, atrial fibrillation, QTc 484 - TSH is within normal limits - Cardiology consulted and recommends admission over to [facility] - Continue diltiazem drip - Defer further cardiac imaging such as echo to cardiology - Patient is anticoagulated with heparin - Continue to monitor
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
## 2023-08-03 NOTE — ED Notes (Signed)
Pt O2 not reading continuously so I go into his room and check about every 30 min unless the reading looks very unhealthy from the computer that is sitting in front of his room

## 2023-08-04 ENCOUNTER — Inpatient Hospital Stay (HOSPITAL_COMMUNITY): Payer: MEDICAID

## 2023-08-04 ENCOUNTER — Other Ambulatory Visit (HOSPITAL_COMMUNITY): Payer: Self-pay

## 2023-08-04 ENCOUNTER — Ambulatory Visit (HOSPITAL_COMMUNITY): Admission: RE | Admit: 2023-08-04 | Payer: MEDICAID | Source: Home / Self Care | Admitting: Cardiovascular Disease

## 2023-08-04 DIAGNOSIS — I4891 Unspecified atrial fibrillation: Secondary | ICD-10-CM | POA: Diagnosis not present

## 2023-08-04 DIAGNOSIS — I5021 Acute systolic (congestive) heart failure: Secondary | ICD-10-CM

## 2023-08-04 DIAGNOSIS — I480 Paroxysmal atrial fibrillation: Secondary | ICD-10-CM | POA: Diagnosis not present

## 2023-08-04 DIAGNOSIS — I214 Non-ST elevation (NSTEMI) myocardial infarction: Secondary | ICD-10-CM | POA: Diagnosis not present

## 2023-08-04 DIAGNOSIS — I639 Cerebral infarction, unspecified: Secondary | ICD-10-CM | POA: Diagnosis not present

## 2023-08-04 DIAGNOSIS — F1910 Other psychoactive substance abuse, uncomplicated: Secondary | ICD-10-CM | POA: Diagnosis not present

## 2023-08-04 LAB — GLUCOSE, CAPILLARY
Glucose-Capillary: 108 mg/dL — ABNORMAL HIGH (ref 70–99)
Glucose-Capillary: 102 mg/dL — ABNORMAL HIGH (ref 70–99)
Glucose-Capillary: 91 mg/dL (ref 70–99)
Glucose-Capillary: 102 mg/dL — ABNORMAL HIGH (ref 70–99)
Glucose-Capillary: 108 mg/dL — ABNORMAL HIGH (ref 70–99)

## 2023-08-04 LAB — CBC
HCT: 38.6 % — ABNORMAL LOW (ref 39.0–52.0)
Hemoglobin: 13.5 g/dL (ref 13.0–17.0)
MCH: 33.7 pg (ref 26.0–34.0)
MCHC: 35 g/dL (ref 30.0–36.0)
MCV: 96.3 fL (ref 80.0–100.0)
Platelets: 139 10*3/uL — ABNORMAL LOW (ref 150–400)
RBC: 4.01 MIL/uL — ABNORMAL LOW (ref 4.22–5.81)
RDW: 12.6 % (ref 11.5–15.5)
WBC: 11.4 10*3/uL — ABNORMAL HIGH (ref 4.0–10.5)
nRBC: 0 % (ref 0.0–0.2)

## 2023-08-04 LAB — LIPID PANEL
Cholesterol: 148 mg/dL (ref 0–200)
HDL: 37 mg/dL — ABNORMAL LOW (ref >40)
LDL Cholesterol: 76 mg/dL (ref 0–99)
Total CHOL/HDL Ratio: 4 {ratio}
Triglycerides: 177 mg/dL — ABNORMAL HIGH (ref <150)
VLDL: 35 mg/dL (ref 0–40)

## 2023-08-04 LAB — BASIC METABOLIC PANEL
Anion gap: 8 (ref 5–15)
BUN: 14 mg/dL (ref 6–20)
CO2: 25 mmol/L (ref 22–32)
Calcium: 8.4 mg/dL — ABNORMAL LOW (ref 8.9–10.3)
Chloride: 103 mmol/L (ref 98–111)
Creatinine, Ser: 1.09 mg/dL (ref 0.61–1.24)
GFR, Estimated: >60 mL/min (ref >60)
Glucose, Bld: 114 mg/dL — ABNORMAL HIGH (ref 70–99)
Potassium: 3.6 mmol/L (ref 3.5–5.1)
Sodium: 136 mmol/L (ref 135–145)

## 2023-08-04 LAB — HEPARIN LEVEL (UNFRACTIONATED)
Heparin Unfractionated: 0.28 [IU]/mL — ABNORMAL LOW (ref 0.30–0.70)
Heparin Unfractionated: 0.34 [IU]/mL (ref 0.30–0.70)

## 2023-08-04 LAB — MRSA NEXT GEN BY PCR, NASAL: MRSA by PCR Next Gen: DETECTED — AB

## 2023-08-04 MED ORDER — CHLORHEXIDINE GLUCONATE CLOTH 2 % EX PADS
6.0000 | MEDICATED_PAD | Freq: Every day | CUTANEOUS | Status: DC
  Administered 2023-08-05: 6 via TOPICAL

## 2023-08-04 MED ORDER — MUPIROCIN 2 % EX OINT
1.0000 | TOPICAL_OINTMENT | Freq: Two times a day (BID) | CUTANEOUS | Status: DC
  Administered 2023-08-04: 1 via NASAL
  Filled 2023-08-04: qty 22

## 2023-08-04 MED ORDER — IOPAMIDOL (ISOVUE-370) INJECTION 76%
75.0000 mL | Freq: Once | INTRAVENOUS | Status: AC | PRN
  Administered 2023-08-04: 75 mL via INTRAVENOUS

## 2023-08-04 NOTE — Progress Notes (Signed)
(+)   MRSA screen; MRSA PCR standing orders implemented.
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
## 2023-08-04 NOTE — Progress Notes (Addendum)
CSW received consult for patient. CSW spoke with patient at bedside. CSW offered patient [facility] Guide resources.Patient accepted. All questions answered. No further questions reported at this time.
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
## 2023-08-04 NOTE — TOC Initial Note (Signed)
Transition of Care [facility]) - Initial/Assessment Note    Patient Details  Name: Jermaine [name] MRN: [id number] Date of Birth: 07/29/1981  Transition of Care [facility]) CM/SW Contact:    Leone Haven, RN Phone Number: 08/04/2023, 4:07 PM  Clinical Narrative:                 From home, NSTEMI, on hep drip, for heart cath today.  TOC following.        Patient Goals and CMS Choice            Expected Discharge Plan and Services                                              Prior Living Arrangements/Services                       Activities of Daily Living      Permission Sought/Granted                  Emotional Assessment              Admission diagnosis:  A-fib [facility]) [I48.91] NSTEMI (non-ST elevated myocardial infarction) [facility]) [I21.4] Patient Active Problem List   Diagnosis Date Noted   Acute systolic heart failure (HCC) 08/04/2023   NSTEMI (non-ST elevated myocardial infarction) (HCC) 08/03/2023   Chronic pain 08/03/2023   Leukocytosis 08/03/2023   Altered mental status 08/03/2023   A-fib (HCC) 08/02/2023   Protrusion of lumbar intervertebral disc 04/25/2021   Polysubstance abuse (HCC) 12/14/2020   Substance induced mood disorder (HCC) 12/14/2020   Ankle impingement syndrome, left    Pneumonia 01/29/2020   Diabetes mellitus type 2 in nonobese [facility])    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   Drug-induced erectile dysfunction 09/27/2019   Pain in left testicle 09/27/2019   S/P right knee arthroscopy 03/16/19 03/21/2019   Old bucket handle tear of lateral meniscus of right knee    Bipolar 1 disorder, depressed (HCC) 02/26/2019   Major depressive disorder, recurrent episode (HCC) 08/25/2018   Bipolar 1 disorder (HCC) 08/01/2018   Borderline personality disorder (HCC) 08/01/2018   Obstructive sleep apnea (adult) (pediatric) 08/01/2018   Unspecified convulsions (HCC) 08/01/2018   Obstipation 03/14/2018   Opioid dependence (HCC)  02/09/2018   Thrombocytopenia (HCC) 02/09/2018   Tobacco abuse 02/09/2018   Polypharmacy 12/23/2017   Constipation 11/23/2017   GERD (gastroesophageal reflux disease) 11/23/2017   Nausea with vomiting 11/23/2017   Rectal bleeding 11/23/2017   Abnormal weight loss 11/23/2017   Mood disorder (HCC) 10/28/2017   Seizure-like activity (HCC) 10/28/2017   Bipolar disorder current episode depressed (HCC) 05/26/2015   Hypertension 05/26/2015   Suicidal ideation 05/26/2015   Bipolar disorder, current episode mixed (HCC) 05/26/2015   PCP:  Benita Stabile, MD Pharmacy:   [facility] [phone] - [place], [place] - [address] [name] [address] Phone: [phone] Fax: [phone]     Social Determinants of Health (SDOH) Social History: SDOH Screenings   Food Insecurity: Food Insecurity Present (08/25/2018)  Transportation Needs: No Transportation Needs (08/25/2018)  Alcohol Screen: Low Risk  (02/26/2019)  Depression (PHQ2-9): Medium Risk (12/11/2020)  Financial Resource Strain: Medium Risk (08/25/2018)  Physical Activity: Insufficiently Active (08/25/2018)  Social Connections: Somewhat Isolated (08/25/2018)  Stress: Stress  Concern Present (08/25/2018)  Tobacco Use: High Risk (08/02/2023)   SDOH Interventions: Food Insecurity Interventions: Other (Comment) (CSW offered [facility] [employer]. Patient accepted.)   Readmission Risk Interventions     No data to display

## 2023-08-04 NOTE — Progress Notes (Signed)
Orthopedic Tech Progress Note Patient Details:  Jermaine [name] 1980/12/23 [id number] AFO has been ordered from [facility]  Patient ID: Jermaine [name], male   DOB: 1981-03-07, 42 y.o.   MRN: [id number]  Jermaine [name] 08/04/2023, 2:25 PM
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
## 2023-08-04 NOTE — Plan of Care (Signed)
Problem: Education: Goal: Ability to describe self-care measures that may prevent or decrease complications (Diabetes Survival Skills Education) will improve Outcome: Progressing Goal: Individualized Educational Video(s) Outcome: Progressing   Problem: Coping: Goal: Ability to adjust to condition or change in health will improve Outcome: Progressing   Problem: Fluid Volume: Goal: Ability to maintain a balanced intake and output will improve Outcome: Progressing   Problem: Health Behavior/Discharge Planning: Goal: Ability to identify and utilize available resources and services will improve Outcome: Progressing Goal: Ability to manage health-related needs will improve Outcome: Progressing   Problem: Metabolic: Goal: Ability to maintain appropriate glucose levels will improve Outcome: Progressing   Problem: Nutritional: Goal: Maintenance of adequate nutrition will improve Outcome: Progressing Goal: Progress toward achieving an optimal weight will improve Outcome: Progressing   Problem: Skin Integrity: Goal: Risk for impaired skin integrity will decrease Outcome: Progressing   Problem: Tissue Perfusion: Goal: Adequacy of tissue perfusion will improve Outcome: Progressing   Problem: Education: Goal: Knowledge of General Education information will improve Description: Including pain rating scale, medication(s)/side effects and non-pharmacologic comfort measures Outcome: Progressing   Problem: Health Behavior/Discharge Planning: Goal: Ability to manage health-related needs will improve Outcome: Progressing   Problem: Clinical Measurements: Goal: Ability to maintain clinical measurements within normal limits will improve Outcome: Progressing Goal: Will remain free from infection Outcome: Progressing Goal: Diagnostic test results will improve Outcome: Progressing Goal: Respiratory complications will improve Outcome: Progressing Goal: Cardiovascular complication will  be avoided Outcome: Progressing   Problem: Activity: Goal: Risk for activity intolerance will decrease Outcome: Progressing   Problem: Nutrition: Goal: Adequate nutrition will be maintained Outcome: Progressing   Problem: Coping: Goal: Level of anxiety will decrease Outcome: Progressing   Problem: Elimination: Goal: Will not experience complications related to bowel motility Outcome: Progressing Goal: Will not experience complications related to urinary retention Outcome: Progressing   Problem: Pain Management: Goal: General experience of comfort will improve Outcome: Progressing   Problem: Safety: Goal: Ability to remain free from injury will improve Outcome: Progressing   Problem: Skin Integrity: Goal: Risk for impaired skin integrity will decrease Outcome: Progressing   Problem: Education: Goal: Understanding of CV disease, CV risk reduction, and recovery process will improve Outcome: Progressing Goal: Individualized Educational Video(s) Outcome: Progressing   Problem: Activity: Goal: Ability to return to baseline activity level will improve Outcome: Progressing   Problem: Cardiovascular: Goal: Ability to achieve and maintain adequate cardiovascular perfusion will improve Outcome: Progressing Goal: Vascular access site(s) Level 0-1 will be maintained Outcome: Progressing   Problem: Health Behavior/Discharge Planning: Goal: Ability to safely manage health-related needs after discharge will improve Outcome: Progressing   Problem: Education: Goal: Knowledge of disease or condition will improve Outcome: Progressing Goal: Knowledge of secondary prevention will improve (MUST DOCUMENT ALL) Outcome: Progressing Goal: Knowledge of patient specific risk factors will improve Loraine Leriche N/A or DELETE if not current risk factor) Outcome: Progressing   Problem: Ischemic Stroke/TIA Tissue Perfusion: Goal: Complications of ischemic stroke/TIA will be minimized Outcome:  Progressing   Problem: Coping: Goal: Will verbalize positive feelings about self Outcome: Progressing Goal: Will identify appropriate support needs Outcome: Progressing   Problem: Health Behavior/Discharge Planning: Goal: Ability to manage health-related needs will improve Outcome: Progressing Goal: Goals will be collaboratively established with patient/family Outcome: Progressing   Problem: Self-Care: Goal: Ability to participate in self-care as condition permits will improve Outcome: Progressing Goal: Verbalization of feelings and concerns over difficulty with self-care will improve Outcome: Progressing Goal: Ability to communicate needs accurately will improve Outcome: Progressing  Problem: Nutrition: Goal: Risk of aspiration will decrease Outcome: Progressing Goal: Dietary intake will improve Outcome: Progressing

## 2023-08-04 NOTE — Progress Notes (Addendum)
STROKE TEAM PROGRESS NOTE   SUBJECTIVE (INTERVAL HISTORY) His fiance on the phone, Dr. Berdine Dance is at the bedside.  Overall his condition is rapidly improving.  He still complaining of left-sided numbness more at left leg lateral part.  Plan for cardiac cath tomorrow.   OBJECTIVE Temp:  [97.4 F (36.3 C)-98.6 F (37 C)] 98.1 F (36.7 C) (11/13 1238) Pulse Rate:  [83-100] 83 (11/13 1238) Cardiac Rhythm: Normal sinus rhythm (11/13 0700) Resp:  [18-20] 18 (11/13 1238) BP: (121-144)/(77-101) 135/95 (11/13 1238) SpO2:  [91 %-100 %] 100 % (11/13 1238)  Recent Labs  Lab 08/03/23 2046 08/03/23 2321 08/04/23 0350 08/04/23 0901 08/04/23 1237  GLUCAP 142* 139* 108* 102* 91   Recent Labs  Lab 08/02/23 2048 08/03/23 0146 08/04/23 0324  NA 137 136 136  K 4.5 4.6 3.6  CL 100 105 103  CO2 19* 20* 25  GLUCOSE 291* 74 114*  BUN 16 17 14   CREATININE 1.72* 1.21 1.09  CALCIUM 8.9 7.9* 8.4*  MG  --  1.7  --    Recent Labs  Lab 08/02/23 2048 08/03/23 0146  AST 47* 85*  ALT 34 40  ALKPHOS 91 61  BILITOT 0.2 0.4  PROT 7.0 6.2*  ALBUMIN 4.0 3.6   Recent Labs  Lab 08/02/23 2048 08/03/23 0146 08/04/23 0324  WBC 29.4* 25.7* 11.4*  NEUTROABS 26.7* 22.4*  --   HGB 15.9 14.5 13.5  HCT 46.7 41.5 38.6*  MCV 100.2* 97.9 96.3  PLT 210 178 139*   No results for input(s): "CKTOTAL", "CKMB", "CKMBINDEX", "TROPONINI" in the last 168 hours. No results for input(s): "LABPROT", "INR" in the last 72 hours. Recent Labs    08/02/23 2202  COLORURINE YELLOW  LABSPEC 1.014  PHURINE 5.0  GLUCOSEU >=500*  HGBUR MODERATE*  BILIRUBINUR NEGATIVE  KETONESUR NEGATIVE  PROTEINUR 100*  NITRITE NEGATIVE  LEUKOCYTESUR NEGATIVE       Component Value Date/Time   CHOL 148 08/04/2023 0324   TRIG 177 (H) 08/04/2023 0324   HDL 37 (L) 08/04/2023 0324   CHOLHDL 4.0 08/04/2023 0324   VLDL 35 08/04/2023 0324   LDLCALC 76 08/04/2023 0324   Lab Results  Component Value Date   HGBA1C 5.2 08/03/2023       Component Value Date/Time   LABOPIA POSITIVE (A) 08/02/2023 2202   COCAINSCRNUR POSITIVE (A) 08/02/2023 2202   LABBENZ NONE DETECTED 08/02/2023 2202   AMPHETMU NONE DETECTED 08/02/2023 2202   THCU POSITIVE (A) 08/02/2023 2202   LABBARB NONE DETECTED 08/02/2023 2202    Recent Labs  Lab 08/02/23 2048  ETH <10    I have personally reviewed the radiological images below and agree with the radiology interpretations.  CT ANGIO HEAD NECK W WO CM  Result Date: 08/04/2023 CLINICAL DATA:  Acute infarcts on MRI, determine embolic source EXAM: CT ANGIOGRAPHY HEAD AND NECK WITH AND WITHOUT CONTRAST TECHNIQUE: Multidetector CT imaging of the head and neck was performed using the standard protocol during bolus administration of intravenous contrast. Multiplanar CT image reconstructions and MIPs were obtained to evaluate the vascular anatomy. Carotid stenosis measurements (when applicable) are obtained utilizing NASCET criteria, using the distal internal carotid diameter as the denominator. RADIATION DOSE REDUCTION: This exam was performed according to the departmental dose-optimization program which includes automated exposure control, adjustment of the mA and/or kV according to patient size and/or use of iterative reconstruction technique. CONTRAST:  75 mL Isovue 370 COMPARISON:  No prior CTA available, correlation is made with 08/02/2023 CT head  and 08/03/2023 MRI head FINDINGS: CT HEAD FINDINGS Brain: Hypodensity in the left cerebellum, which is new compared to 08/02/2023, correlates the acute infarct noted on the interval MRI. No definite CT correlate is seen for the right frontal lobe infarcts. No evidence of additional acute infarct. No hemorrhage, mass, mass effect, or midline shift. No hydrocephalus or extra-axial fluid collection. Vascular: No hyperdense vessel. Skull: Negative for fracture or focal lesion. Sinuses/Orbits: Mucosal thickening and air-fluid level in the left maxillary sinus, left  greater than right sphenoid sinus, ethmoid air cells, and left frontal sinus. No acute finding in the orbits. Other: The mastoid air cells are well aerated. CTA NECK FINDINGS Aortic arch: Four-vessel arch, with the left vertebral artery originating from the aorta. Imaged portion shows no evidence of aneurysm or dissection. No significant stenosis of the major arch vessel origins. Right carotid system: No evidence of dissection, occlusion, or hemodynamically significant stenosis (greater than 50%). Left carotid system: No evidence of dissection, occlusion, or hemodynamically significant stenosis (greater than 50%). Vertebral arteries: Right dominant system. No evidence of dissection, occlusion, or hemodynamically significant stenosis (greater than 50%). Skeleton: No acute osseous abnormality. Degenerative changes in the cervical spine. Edentulous. Other neck: No acute finding. Upper chest: No focal pulmonary opacity or pleural effusion. Review of the MIP images confirms the above findings CTA HEAD FINDINGS Anterior circulation: Both internal carotid arteries are patent to the termini, without significant stenosis. A1 segments patent. Normal anterior communicating artery. Anterior cerebral arteries are patent to their distal aspects without significant stenosis. No M1 stenosis or occlusion. MCA branches perfused to their distal aspects without significant stenosis. Posterior circulation: Vertebral arteries patent to the vertebrobasilar junction without significant stenosis. Posterior inferior cerebellar arteries patent proximally. Basilar patent to its distal aspect without significant stenosis. Superior cerebellar arteries patent proximally. Patent P1 segments. PCAs perfused to their distal aspects without significant stenosis. The bilateral posterior communicating arteries are not visualized. Venous sinuses: As permitted by contrast timing, patent. Anatomic variants: None significant. No evidence of aneurysm or  vascular malformation. Review of the MIP images confirms the above findings IMPRESSION: 1. No intracranial large vessel occlusion or significant stenosis. 2. No hemodynamically significant stenosis in the neck. 3. Hypodensity in the left cerebellum correlates with the acute infarct noted on the MRI. No definite CT correlate is seen for the right frontal lobe infarcts. No additional acute intracranial process. 4. Paranasal sinus disease, which could represent acute sinusitis in the appropriate clinical setting. Electronically Signed   By: Wiliam Ke M.D.   On: 08/04/2023 12:18   MR BRAIN WO CONTRAST  Result Date: 08/03/2023 CLINICAL DATA:  Pain and decreased sensation in left foot below knee since he woke up today EXAM: MRI HEAD WITHOUT CONTRAST TECHNIQUE: Multiplanar, multiecho pulse sequences of the brain and surrounding structures were obtained without intravenous contrast. COMPARISON:  11/18/2017 MRI head, correlation is also made with 08/02/2023 CT head FINDINGS: Brain: Restricted diffusion with ADC correlate left cerebellum (series 3, image 13-15) and in the right frontal cortex (series 3, images 42-44), with an additional punctate infarct in the posterior right frontal white matter (series 3, image 40). These areas are associated with increased T2 hyperintense signal. No evidence of acute hemorrhage, mass, mass effect, or midline shift. No hydrocephalus or extra-axial collection. Normal cerebral volume for age. Vascular: Normal arterial flow voids. Skull and upper cervical spine: Normal marrow signal. Sinuses/Orbits: Mucosal thickening and air-fluid levels in the left maxillary sinus, left greater than right sphenoid sinus, ethmoid air cells,  and left frontal sinus. No acute finding in the orbits. Other: Trace fluid in the mastoid air cells. IMPRESSION: 1. Acute infarcts in the left cerebellum and right frontal cortex and white matter. Given multiple vascular territories, consider a central embolic  etiology. 2. Mucosal thickening and air-fluid levels in the paranasal sinuses, which can be seen in the setting of acute sinusitis. These results will be called to the ordering clinician or representative by the Radiologist Assistant, and communication documented in the PACS or [employer]. Electronically Signed   By: Wiliam Ke M.D.   On: 08/03/2023 19:54   ECHOCARDIOGRAM COMPLETE  Result Date: 08/03/2023    ECHOCARDIOGRAM REPORT   Patient Name:   Jermaine [name] Date of Exam: 08/03/2023 Medical Rec #:  [id number]      Height:       74.0 in Accession #:    [id number]     Weight:       213.8 lb Date of Birth:  [birth year]      BSA:          2.236 m Patient Age:    42 years       BP:           146/94 mmHg Patient Gender: M              HR:           88 bpm. [facility] Procedure: 2D Echo, Cardiac Doppler and Color Doppler Indications:    NSTEMI ;21.4  History:        Patient has no prior history of Echocardiogram examinations. CAD                 and Previous Myocardial Infarction, Arrythmias:Atrial                 Fibrillation; Risk Factors:Hypertension, Diabetes and Current                 Smoker. Altered mental status, Polysubstance abuse (HCC),                 Bipolar 1 disorder (HCC), Obstructive sleep apnea.  Sonographer:    Celesta Gentile RCS Referring Phys: [id number] Ellsworth Lennox IMPRESSIONS  1. Left ventricular ejection fraction, by estimation, is 35 to 40%. The left ventricle has mild to moderately decreased function. The left ventricle demonstrates global hypokinesis. The left ventricular internal cavity size was mildly dilated. Left ventricular diastolic parameters are indeterminate.  2. Right ventricular systolic function is normal. The right ventricular size is normal. Tricuspid regurgitation signal is inadequate for assessing PA pressure.  3. The mitral valve is abnormal. Mild mitral valve regurgitation. No evidence of mitral stenosis.  4. The aortic valve is tricuspid.  Aortic valve regurgitation is not visualized. No aortic stenosis is present.  5. The inferior vena cava is dilated in size with >50% respiratory variability, suggesting right atrial pressure of 8 mmHg. FINDINGS  Left Ventricle: Left ventricular ejection fraction, by estimation, is 35 to 40%. The left ventricle has mild to moderately decreased function. The left ventricle demonstrates global hypokinesis. The left ventricular internal cavity size was mildly dilated. There is no left ventricular hypertrophy. Left ventricular diastolic parameters are indeterminate. Right Ventricle: The right ventricular size is normal. No increase in right ventricular wall thickness. Right ventricular systolic function is normal. Tricuspid regurgitation signal is inadequate for assessing PA pressure. Left Atrium: Left atrial size was normal in size. Right Atrium: Right atrial size was normal  in size. Pericardium: There is no evidence of pericardial effusion. Mitral Valve: The mitral valve is abnormal. Mild mitral valve regurgitation. No evidence of mitral valve stenosis. Tricuspid Valve: The tricuspid valve is normal in structure. Tricuspid valve regurgitation is not demonstrated. No evidence of tricuspid stenosis. Aortic Valve: The aortic valve is tricuspid. Aortic valve regurgitation is not visualized. No aortic stenosis is present. Aortic valve mean gradient measures 3.7 mmHg. Aortic valve peak gradient measures 6.9 mmHg. Aortic valve area, by VTI measures 2.94 cm. Pulmonic Valve: The pulmonic valve was not well visualized. Pulmonic valve regurgitation is not visualized. No evidence of pulmonic stenosis. Aorta: The aortic root is normal in size and structure. Venous: The inferior vena cava is dilated in size with greater than 50% respiratory variability, suggesting right atrial pressure of 8 mmHg. IAS/Shunts: No atrial level shunt detected by color flow Doppler.  LEFT VENTRICLE PLAX 2D LVIDd:         6.00 cm      Diastology LVIDs:          4.80 cm      LV e' medial:    7.40 cm/s LV PW:         1.10 cm      LV E/e' medial:  13.9 LV IVS:        1.00 cm      LV e' lateral:   7.40 cm/s LVOT diam:     2.30 cm      LV E/e' lateral: 13.9 LV SV:         67 LV SV Index:   30 LVOT Area:     4.15 cm  LV Volumes (MOD) LV vol d, MOD A2C: 181.0 ml LV vol d, MOD A4C: 169.0 ml LV vol s, MOD A2C: 118.0 ml LV vol s, MOD A4C: 111.0 ml LV SV MOD A2C:     63.0 ml LV SV MOD A4C:     169.0 ml LV SV MOD BP:      61.0 ml RIGHT VENTRICLE RV S prime:     10.30 cm/s TAPSE (M-mode): 1.8 cm LEFT ATRIUM             Index        RIGHT ATRIUM           Index LA diam:        4.30 cm 1.92 cm/m   RA Area:     13.00 cm LA Vol (A2C):   79.6 ml 35.60 ml/m  RA Volume:   32.70 ml  14.62 ml/m LA Vol (A4C):   46.4 ml 20.75 ml/m LA Biplane Vol: 62.9 ml 28.13 ml/m  AORTIC VALVE AV Area (Vmax):    2.86 cm AV Area (Vmean):   2.85 cm AV Area (VTI):     2.94 cm AV Vmax:           131.08 cm/s AV Vmean:          90.415 cm/s AV VTI:            0.229 m AV Peak Grad:      6.9 mmHg AV Mean Grad:      3.7 mmHg LVOT Vmax:         90.20 cm/s LVOT Vmean:        62.000 cm/s LVOT VTI:          0.162 m LVOT/AV VTI ratio: 0.71  AORTA Ao Root diam: 3.90 cm MITRAL VALVE MV Area (PHT): 4.15 cm  SHUNTS MV Decel Time: 183 msec     Systemic VTI:  0.16 m MV E velocity: 103.00 cm/s  Systemic Diam: 2.30 cm MV A velocity: 73.70 cm/s MV E/A ratio:  1.40 Dina Rich MD Electronically signed by Dina Rich MD Signature Date/Time: 08/03/2023/1:31:27 PM    Final    DG Chest Port 1 View  Result Date: 08/02/2023 CLINICAL DATA:  Shortness of breath EXAM: PORTABLE CHEST 1 VIEW COMPARISON:  07/16/2020 FINDINGS: Interstitial coarsening and hazy ground-glass opacity in the left lung. The right lung is clear. Stable cardiomediastinal silhouette. No pleural effusion or pneumothorax. IMPRESSION: Interstitial coarsening and hazy ground-glass opacity in the left lung, suspicious for atypical pneumonia.  Electronically Signed   By: Minerva Fester M.D.   On: 08/02/2023 23:48   CT Cervical Spine Wo Contrast  Result Date: 08/02/2023 CLINICAL DATA:  Found down in bathroom EXAM: CT CERVICAL SPINE WITHOUT CONTRAST TECHNIQUE: Multidetector CT imaging of the cervical spine was performed without intravenous contrast. Multiplanar CT image reconstructions were also generated. RADIATION DOSE REDUCTION: This exam was performed according to the departmental dose-optimization program which includes automated exposure control, adjustment of the mA and/or kV according to patient size and/or use of iterative reconstruction technique. COMPARISON:  CT 03/09/2023 FINDINGS: Alignment: Straightening of the cervical spine. No subluxation. Facet alignment is within normal limits. Skull base and vertebrae: No acute fracture. No primary bone lesion or focal pathologic process. Soft tissues and spinal canal: No prevertebral fluid or swelling. No visible canal hematoma. Disc levels:  Mild degenerative changes C4-C5 and C5-C6. Upper chest: Hazy left apical infiltrate or edema Other: None IMPRESSION: 1. Straightening of the cervical spine with mild degenerative changes. No acute osseous abnormality. 2. Hazy left apical infiltrate or edema. Electronically Signed   By: Jasmine Pang M.D.   On: 08/02/2023 23:16   CT Head Wo Contrast  Result Date: 08/02/2023 CLINICAL DATA:  Trauma EXAM: CT HEAD WITHOUT CONTRAST TECHNIQUE: Contiguous axial images were obtained from the base of the skull through the vertex without intravenous contrast. RADIATION DOSE REDUCTION: This exam was performed according to the departmental dose-optimization program which includes automated exposure control, adjustment of the mA and/or kV according to patient size and/or use of iterative reconstruction technique. COMPARISON:  head CT 02/08/2023 FINDINGS: Brain: No evidence of acute infarction, hemorrhage, hydrocephalus, extra-axial collection or mass lesion/mass  effect. Vascular: No hyperdense vessel or unexpected calcification. Skull: Normal. Negative for fracture or focal lesion. Sinuses/Orbits: There are air-fluid levels in the left sphenoid sinus, left frontal sinus and left sphenoid sinus. Orbits are within normal limits. Other: None. IMPRESSION: 1. No acute intracranial process. 2. Air-fluid levels in the left sphenoid sinus, left frontal sinus and left sphenoid sinus. Correlate for acute sinusitis. Electronically Signed   By: Darliss Cheney M.D.   On: 08/02/2023 23:13   DG Elbow 2 Views Right  Result Date: 08/02/2023 Right elbow AP lateral right elbow Pain along the right lateral elbow over the right lateral epicondyle AP and lateral views were taken to rule out fracture No obvious fracture seen Subtle radial head and neck fractures can be missed on initial plain films Repeat imaging if clinically necessary Impression normal right elbow   DG Wrist Complete Right  Result Date: 07/18/2023 CLINICAL DATA:  Wrist pain after injury.  Seizure like activity. EXAM: RIGHT WRIST - COMPLETE 3+ VIEW COMPARISON:  None Available. FINDINGS: There is a fracture involving the ulnar styloid with mild distraction of the fracture fragments on the order of 2  mm. No additional fracture or dislocation identified. Signs of remote deformity involving the base of the fifth meta carpal bone with volar angulation. IMPRESSION: Acute fracture involving the ulnar styloid with mild distraction of the fracture fragments. Electronically Signed   By: Signa Kell M.D.   On: 07/18/2023 13:04     PHYSICAL EXAM  Temp:  [97.4 F (36.3 C)-98.6 F (37 C)] 98.1 F (36.7 C) (11/13 1238) Pulse Rate:  [83-100] 83 (11/13 1238) Resp:  [18-20] 18 (11/13 1238) BP: (121-144)/(77-101) 135/95 (11/13 1238) SpO2:  [91 %-100 %] 100 % (11/13 1238)  General - Well nourished, well developed, in no apparent distress.  Ophthalmologic - fundi not visualized due to noncooperation.  Cardiovascular -  Regular rhythm and rate.  Neuro - awake, alert, eyes open, orientated to age, place, time and people. No aphasia, fluent language, following all simple commands. Able to name and repeat. No gaze palsy, tracking bilaterally, visual field full, PERRL. No facial droop. Tongue midline. RUE and RLE 5/5, LUE and LLE 4/5 with lack of effort on exam. Sensation subjectively decreased on the left face and LUE, decreased on the lateral half lf LLE, b/l FTN intact, gait not tested.     ASSESSMENT/PLAN Mr. Jermaine [name] is a 42 y.o. male with history of substance abuse, bipolar disorder, anxiety, hypertension, hyperlipidemia, diabetes, pseudoseizure, smoker admitted for drug overdose and complaint left-sided numbness, found to have A-fib subsequently. No tPA given due to window.    Stroke:  left cerebellum small, as well as right MCA/ACA, and MCA punctate infarcts, embolic pattern may secondary to newly diagnosed A-fib CT no acute abnormality CT head and neck unremarkable MRI acute infarct in left cerebellum and right frontal cortex and white matter 2D Echo EF 35 to 40% LDL 76 HgbA1c 5.2 UDS positive for cocaine, opiates and THC Heparin IV for VTE prophylaxis No antithrombotic prior to admission, now on aspirin 81 mg daily and heparin IV.  Ongoing aggressive stroke risk factor management Therapy recommendations: Home health PT and OT Disposition: Pending  Non-STEMI Cardiomyopathy Troponin [id number] Cardiology on board Cardiac cath pending tomorrow EF 35 to 40% On aspirin and heparin IV On Coreg  No diagnosed A-fib Paroxysmal versus lone A-fib triggered by non-STEMI Cardiology on board On heparin IV and Coreg May not need long-term anticoagulation per Dr. Drue Stager  Diabetes HgbA1c 5.2 goal < 7.0 Controlled CBG monitoring SSI DM education and close PCP follow up  Hypertension Stable On Coreg Long term BP goal normotensive  Hyperlipidemia Home meds: None LDL 76, goal <  70 Now on Crestor 40 Continue statin at discharge  Substance abuse UDS positive for cocaine, THC and opiates Cessation education provided Patient is willing to create  Tobacco abuse Current smoker Smoking cessation counseling provided Nicotine patch provided Pt is willing to quit  Other Stroke Risk Factors   Other Active Problems Bipolar disorder Anxiety ?  Pseudoseizure Mild leukocytosis, WBC 11.4  Hospital day # 2    Marvel Plan, MD PhD Stroke Neurology 08/04/2023 1:34 PM    To contact Stroke Continuity provider, please refer to [email or web address]. After hours, contact General Neurology

## 2023-08-04 NOTE — TOC Benefit Eligibility Note (Signed)
Patient Product/process development scientist completed.    The patient is insured through [employer] [place].     Ran test claim for Eliquis 5 mg and the current 30 day co-pay is $4.00.  Ran test claim for Xarelto 20 mg and the current 30 day co-pay is $4.00.  This test claim was processed through [facility]- copay amounts may vary at other pharmacies due to pharmacy/plan contracts, or as the patient moves through the different stages of their insurance plan.     Roland Earl, CPHT Pharmacy Technician III Certified Patient Advocate [facility] Patient Advocate Team Direct Number: [phone]  Fax: [phone]
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
## 2023-08-05 ENCOUNTER — Encounter (HOSPITAL_COMMUNITY): Payer: Self-pay | Admitting: Cardiovascular Disease

## 2023-08-05 ENCOUNTER — Other Ambulatory Visit (HOSPITAL_COMMUNITY): Payer: Self-pay

## 2023-08-05 ENCOUNTER — Encounter (HOSPITAL_COMMUNITY)
Admission: EM | Disposition: A | Discharge status: Home / Self Care | Payer: Self-pay | Source: Home / Self Care | Attending: Internal Medicine

## 2023-08-05 DIAGNOSIS — I480 Paroxysmal atrial fibrillation: Secondary | ICD-10-CM | POA: Diagnosis not present

## 2023-08-05 DIAGNOSIS — R7989 Other specified abnormal findings of blood chemistry: Secondary | ICD-10-CM

## 2023-08-05 DIAGNOSIS — I5021 Acute systolic (congestive) heart failure: Secondary | ICD-10-CM | POA: Diagnosis not present

## 2023-08-05 DIAGNOSIS — I428 Other cardiomyopathies: Secondary | ICD-10-CM

## 2023-08-05 DIAGNOSIS — I214 Non-ST elevation (NSTEMI) myocardial infarction: Secondary | ICD-10-CM | POA: Diagnosis not present

## 2023-08-05 DIAGNOSIS — F1410 Cocaine abuse, uncomplicated: Secondary | ICD-10-CM | POA: Diagnosis not present

## 2023-08-05 DIAGNOSIS — F1910 Other psychoactive substance abuse, uncomplicated: Secondary | ICD-10-CM | POA: Diagnosis not present

## 2023-08-05 HISTORY — PX: LEFT HEART CATH AND CORONARY ANGIOGRAPHY: CATH118249

## 2023-08-05 LAB — BASIC METABOLIC PANEL
Anion gap: 7 (ref 5–15)
BUN: 11 mg/dL (ref 6–20)
CO2: 25 mmol/L (ref 22–32)
Calcium: 8.6 mg/dL — ABNORMAL LOW (ref 8.9–10.3)
Chloride: 101 mmol/L (ref 98–111)
Creatinine, Ser: 1.21 mg/dL (ref 0.61–1.24)
GFR, Estimated: >60 mL/min (ref >60)
Glucose, Bld: 99 mg/dL (ref 70–99)
Potassium: 3.6 mmol/L (ref 3.5–5.1)
Sodium: 133 mmol/L — ABNORMAL LOW (ref 135–145)

## 2023-08-05 LAB — CBC
HCT: 39.9 % (ref 39.0–52.0)
Hemoglobin: 13.9 g/dL (ref 13.0–17.0)
MCH: 32.9 pg (ref 26.0–34.0)
MCHC: 34.8 g/dL (ref 30.0–36.0)
MCV: 94.5 fL (ref 80.0–100.0)
Platelets: 137 10*3/uL — ABNORMAL LOW (ref 150–400)
RBC: 4.22 MIL/uL (ref 4.22–5.81)
RDW: 12.3 % (ref 11.5–15.5)
WBC: 6.2 10*3/uL (ref 4.0–10.5)
nRBC: 0 % (ref 0.0–0.2)

## 2023-08-05 LAB — HEPARIN LEVEL (UNFRACTIONATED): Heparin Unfractionated: 0.24 [IU]/mL — ABNORMAL LOW (ref 0.30–0.70)

## 2023-08-05 LAB — GLUCOSE, CAPILLARY
Glucose-Capillary: 105 mg/dL — ABNORMAL HIGH (ref 70–99)
Glucose-Capillary: 112 mg/dL — ABNORMAL HIGH (ref 70–99)
Glucose-Capillary: 116 mg/dL — ABNORMAL HIGH (ref 70–99)
Glucose-Capillary: 92 mg/dL (ref 70–99)

## 2023-08-05 SURGERY — LEFT HEART CATH AND CORONARY ANGIOGRAPHY
Anesthesia: LOCAL

## 2023-08-05 MED ORDER — APIXABAN 5 MG PO TABS
5.0000 mg | ORAL_TABLET | Freq: Two times a day (BID) | ORAL | Status: DC
  Administered 2023-08-05: 5 mg via ORAL
  Filled 2023-08-05: qty 1

## 2023-08-05 MED ORDER — INFLUENZA VIRUS VACC SPLIT PF (FLUZONE) 0.5 ML IM SUSY: 0.5000 mL | PREFILLED_SYRINGE | INTRAMUSCULAR | Status: DC

## 2023-08-05 MED ORDER — IOHEXOL 350 MG/ML SOLN
INTRAVENOUS | Status: DC | PRN
  Administered 2023-08-05: 72 mL

## 2023-08-05 MED ORDER — VERAPAMIL HCL 2.5 MG/ML IV SOLN
INTRAVENOUS | Status: DC | PRN
  Administered 2023-08-05: 10 mL via INTRA_ARTERIAL

## 2023-08-05 MED ORDER — ASPIRIN 81 MG PO CHEW
81.0000 mg | CHEWABLE_TABLET | ORAL | Status: AC
  Administered 2023-08-05: 81 mg via ORAL
  Filled 2023-08-05: qty 1

## 2023-08-05 MED ORDER — MIDAZOLAM HCL 2 MG/2ML IJ SOLN
INTRAMUSCULAR | Status: AC
Start: 2023-08-05 — End: ?
  Filled 2023-08-05: qty 2

## 2023-08-05 MED ORDER — APIXABAN 5 MG PO TABS
5.0000 mg | ORAL_TABLET | Freq: Two times a day (BID) | ORAL | 0 refills | Status: DC
  Filled 2023-08-05: qty 60, 30d supply, fill #0

## 2023-08-05 MED ORDER — LIDOCAINE HCL (PF) 1 % IJ SOLN
INTRAMUSCULAR | Status: DC | PRN
  Administered 2023-08-05: 2 mL via INTRADERMAL

## 2023-08-05 MED ORDER — LABETALOL HCL 5 MG/ML IV SOLN: 10.0000 mg | INTRAVENOUS | Status: DC | PRN

## 2023-08-05 MED ORDER — SODIUM CHLORIDE 0.9% FLUSH: 3.0000 mL | INTRAVENOUS | Status: DC | PRN

## 2023-08-05 MED ORDER — SODIUM CHLORIDE 0.9 % IV SOLN: 250.0000 mL | INTRAVENOUS | Status: DC | PRN

## 2023-08-05 MED ORDER — HYDRALAZINE HCL 20 MG/ML IJ SOLN: 10.0000 mg | INTRAMUSCULAR | Status: DC | PRN

## 2023-08-05 MED ORDER — MIDAZOLAM HCL 2 MG/2ML IJ SOLN
INTRAMUSCULAR | Status: DC | PRN
  Administered 2023-08-05: 2 mg via INTRAVENOUS

## 2023-08-05 MED ORDER — SODIUM CHLORIDE 0.9 % IV SOLN: INTRAVENOUS | Status: DC

## 2023-08-05 MED ORDER — SODIUM CHLORIDE 0.9% FLUSH
3.0000 mL | Freq: Two times a day (BID) | INTRAVENOUS | Status: DC
  Administered 2023-08-05: 3 mL via INTRAVENOUS

## 2023-08-05 MED ORDER — LOSARTAN POTASSIUM 25 MG PO TABS: 25.0000 mg | ORAL_TABLET | Freq: Every day | ORAL | Status: DC

## 2023-08-05 MED ORDER — LOSARTAN POTASSIUM 25 MG PO TABS
25.0000 mg | ORAL_TABLET | Freq: Every day | ORAL | 0 refills | Status: DC
  Filled 2023-08-05: qty 30, 30d supply, fill #0

## 2023-08-05 MED ORDER — VERAPAMIL HCL 2.5 MG/ML IV SOLN
INTRAVENOUS | Status: AC
  Filled 2023-08-05: qty 2

## 2023-08-05 MED ORDER — ROSUVASTATIN CALCIUM 40 MG PO TABS
40.0000 mg | ORAL_TABLET | Freq: Every day | ORAL | 0 refills | Status: DC
  Filled 2023-08-05: qty 30, 30d supply, fill #0

## 2023-08-05 MED ORDER — FENTANYL CITRATE (PF) 100 MCG/2ML IJ SOLN
INTRAMUSCULAR | Status: DC | PRN
  Administered 2023-08-05: 50 ug via INTRAVENOUS

## 2023-08-05 MED ORDER — HEPARIN SODIUM (PORCINE) 1000 UNIT/ML IJ SOLN
INTRAMUSCULAR | Status: DC | PRN
  Administered 2023-08-05: 5000 [IU] via INTRAVENOUS

## 2023-08-05 MED ORDER — HEPARIN (PORCINE) IN NACL 1000-0.9 UT/500ML-% IV SOLN
INTRAVENOUS | Status: DC | PRN
  Administered 2023-08-05 (×2): 500 mL

## 2023-08-05 MED ORDER — FENTANYL CITRATE (PF) 100 MCG/2ML IJ SOLN
INTRAMUSCULAR | Status: AC
  Filled 2023-08-05: qty 2

## 2023-08-05 MED ORDER — HEPARIN SODIUM (PORCINE) 1000 UNIT/ML IJ SOLN
INTRAMUSCULAR | Status: AC
  Filled 2023-08-05: qty 10

## 2023-08-05 MED ORDER — CARVEDILOL 3.125 MG PO TABS
3.1250 mg | ORAL_TABLET | Freq: Two times a day (BID) | ORAL | 0 refills | Status: DC
  Filled 2023-08-05: qty 60, 30d supply, fill #0

## 2023-08-05 SURGICAL SUPPLY — 8 items
CATH 5FR JL3.5 JR4 ANG PIG MP (CATHETERS) IMPLANT
CATH INFINITI 5 FR 3DRC (CATHETERS) IMPLANT
DEVICE RAD COMP TR BAND LRG (VASCULAR PRODUCTS) IMPLANT
GLIDESHEATH SLEND SS 6F .021 (SHEATH) IMPLANT
GUIDEWIRE INQWIRE 1.5J.035X260 (WIRE) IMPLANT
INQWIRE 1.5J .035X260CM (WIRE) ×1
PACK CARDIAC CATHETERIZATION (CUSTOM PROCEDURE TRAY) ×2 IMPLANT
SET ATX-X65L (MISCELLANEOUS) IMPLANT

## 2023-08-05 NOTE — Interval H&P Note (Signed)
History and Physical Interval Note:  08/05/2023 8:43 AM  [name]  has presented today for surgery, with the diagnosis of NSTEMI.  The various methods of treatment have been discussed with the patient and family. After consideration of risks, benefits and other options for treatment, the patient has consented to  Procedure(s): LEFT HEART CATH AND CORONARY ANGIOGRAPHY (N/A) as a surgical intervention.  The patient's history has been reviewed, patient examined, no change in status, stable for surgery.  I have reviewed the patient's chart and labs.  Questions were answered to the patient's satisfaction.    Cath Lab Visit (complete for each Cath Lab visit)  Clinical Evaluation Leading to the Procedure:   ACS: Yes.    Non-ACS:    Anginal Classification: CCS II  Anti-ischemic medical therapy: No Therapy  Non-Invasive Test Results: No non-invasive testing performed  Prior CABG: No previous CABG        Jermaine [name]
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
## 2023-08-05 NOTE — Progress Notes (Signed)
CSW received consult for patient. CSW offered patient outpatient substance use treatment services resources. Patient politely declined. All questions answered. No further questions reported at this time.

## 2023-08-05 NOTE — Progress Notes (Signed)
STROKE TEAM PROGRESS NOTE   SUBJECTIVE (INTERVAL HISTORY) No family is at the bedside. He had cardiac cath today, no CAD. Put on eliquis, off heparin IV.   OBJECTIVE Temp:  [97.5 F (36.4 C)-98.5 F (36.9 C)] 97.5 F (36.4 C) (11/14 0936) Pulse Rate:  [0-83] 69 (11/14 0948) Cardiac Rhythm: Normal sinus rhythm (11/14 0700) Resp:  [11-18] 16 (11/14 0936) BP: (121-152)/(83-103) 127/93 (11/14 0948) SpO2:  [94 %-98 %] 95 % (11/14 0948) Weight:  [89.1 kg] 89.1 kg (11/14 0735)  Recent Labs  Lab 08/04/23 1948 08/05/23 0051 08/05/23 0432 08/05/23 0737 08/05/23 1150  GLUCAP 108* 116* 92 105* 112*   Recent Labs  Lab 08/02/23 2048 08/03/23 0146 08/04/23 0324 08/05/23 0546  NA 137 136 136 133*  K 4.5 4.6 3.6 3.6  CL 100 105 103 101  CO2 19* 20* 25 25  GLUCOSE 291* 74 114* 99  BUN 16 17 14 11   CREATININE 1.72* 1.21 1.09 1.21  CALCIUM 8.9 7.9* 8.4* 8.6*  MG  --  1.7  --   --    Recent Labs  Lab 08/02/23 2048 08/03/23 0146  AST 47* 85*  ALT 34 40  ALKPHOS 91 61  BILITOT 0.2 0.4  PROT 7.0 6.2*  ALBUMIN 4.0 3.6   Recent Labs  Lab 08/02/23 2048 08/03/23 0146 08/04/23 0324 08/05/23 0546  WBC 29.4* 25.7* 11.4* 6.2  NEUTROABS 26.7* 22.4*  --   --   HGB 15.9 14.5 13.5 13.9  HCT 46.7 41.5 38.6* 39.9  MCV 100.2* 97.9 96.3 94.5  PLT 210 178 139* 137*   No results for input(s): "CKTOTAL", "CKMB", "CKMBINDEX", "TROPONINI" in the last 168 hours. No results for input(s): "LABPROT", "INR" in the last 72 hours. Recent Labs    08/02/23 2202  COLORURINE YELLOW  LABSPEC 1.014  PHURINE 5.0  GLUCOSEU >=500*  HGBUR MODERATE*  BILIRUBINUR NEGATIVE  KETONESUR NEGATIVE  PROTEINUR 100*  NITRITE NEGATIVE  LEUKOCYTESUR NEGATIVE       Component Value Date/Time   CHOL 148 08/04/2023 0324   TRIG 177 (H) 08/04/2023 0324   HDL 37 (L) 08/04/2023 0324   CHOLHDL 4.0 08/04/2023 0324   VLDL 35 08/04/2023 0324   LDLCALC 76 08/04/2023 0324   Lab Results  Component Value Date    HGBA1C 5.2 08/03/2023      Component Value Date/Time   LABOPIA POSITIVE (A) 08/02/2023 2202   COCAINSCRNUR POSITIVE (A) 08/02/2023 2202   LABBENZ NONE DETECTED 08/02/2023 2202   AMPHETMU NONE DETECTED 08/02/2023 2202   THCU POSITIVE (A) 08/02/2023 2202   LABBARB NONE DETECTED 08/02/2023 2202    Recent Labs  Lab 08/02/23 2048  ETH <10    I have personally reviewed the radiological images below and agree with the radiology interpretations.  CARDIAC CATHETERIZATION  Result Date: 08/05/2023 No angiographic evidence of CAD Normal LV filling pressures Recommendations: No further ischemic workup.   CT ANGIO HEAD NECK W WO CM  Result Date: 08/04/2023 CLINICAL DATA:  Acute infarcts on MRI, determine embolic source EXAM: CT ANGIOGRAPHY HEAD AND NECK WITH AND WITHOUT CONTRAST TECHNIQUE: Multidetector CT imaging of the head and neck was performed using the standard protocol during bolus administration of intravenous contrast. Multiplanar CT image reconstructions and MIPs were obtained to evaluate the vascular anatomy. Carotid stenosis measurements (when applicable) are obtained utilizing NASCET criteria, using the distal internal carotid diameter as the denominator. RADIATION DOSE REDUCTION: This exam was performed according to the departmental dose-optimization program which includes automated exposure control,  adjustment of the mA and/or kV according to patient size and/or use of iterative reconstruction technique. CONTRAST:  75 mL Isovue 370 COMPARISON:  No prior CTA available, correlation is made with 08/02/2023 CT head and 08/03/2023 MRI head FINDINGS: CT HEAD FINDINGS Brain: Hypodensity in the left cerebellum, which is new compared to 08/02/2023, correlates the acute infarct noted on the interval MRI. No definite CT correlate is seen for the right frontal lobe infarcts. No evidence of additional acute infarct. No hemorrhage, mass, mass effect, or midline shift. No hydrocephalus or extra-axial  fluid collection. Vascular: No hyperdense vessel. Skull: Negative for fracture or focal lesion. Sinuses/Orbits: Mucosal thickening and air-fluid level in the left maxillary sinus, left greater than right sphenoid sinus, ethmoid air cells, and left frontal sinus. No acute finding in the orbits. Other: The mastoid air cells are well aerated. CTA NECK FINDINGS Aortic arch: Four-vessel arch, with the left vertebral artery originating from the aorta. Imaged portion shows no evidence of aneurysm or dissection. No significant stenosis of the major arch vessel origins. Right carotid system: No evidence of dissection, occlusion, or hemodynamically significant stenosis (greater than 50%). Left carotid system: No evidence of dissection, occlusion, or hemodynamically significant stenosis (greater than 50%). Vertebral arteries: Right dominant system. No evidence of dissection, occlusion, or hemodynamically significant stenosis (greater than 50%). Skeleton: No acute osseous abnormality. Degenerative changes in the cervical spine. Edentulous. Other neck: No acute finding. Upper chest: No focal pulmonary opacity or pleural effusion. Review of the MIP images confirms the above findings CTA HEAD FINDINGS Anterior circulation: Both internal carotid arteries are patent to the termini, without significant stenosis. A1 segments patent. Normal anterior communicating artery. Anterior cerebral arteries are patent to their distal aspects without significant stenosis. No M1 stenosis or occlusion. MCA branches perfused to their distal aspects without significant stenosis. Posterior circulation: Vertebral arteries patent to the vertebrobasilar junction without significant stenosis. Posterior inferior cerebellar arteries patent proximally. Basilar patent to its distal aspect without significant stenosis. Superior cerebellar arteries patent proximally. Patent P1 segments. PCAs perfused to their distal aspects without significant stenosis. The  bilateral posterior communicating arteries are not visualized. Venous sinuses: As permitted by contrast timing, patent. Anatomic variants: None significant. No evidence of aneurysm or vascular malformation. Review of the MIP images confirms the above findings IMPRESSION: 1. No intracranial large vessel occlusion or significant stenosis. 2. No hemodynamically significant stenosis in the neck. 3. Hypodensity in the left cerebellum correlates with the acute infarct noted on the MRI. No definite CT correlate is seen for the right frontal lobe infarcts. No additional acute intracranial process. 4. Paranasal sinus disease, which could represent acute sinusitis in the appropriate clinical setting. Electronically Signed   By: Wiliam Ke M.D.   On: 08/04/2023 12:18   MR BRAIN WO CONTRAST  Result Date: 08/03/2023 CLINICAL DATA:  Pain and decreased sensation in left foot below knee since he woke up today EXAM: MRI HEAD WITHOUT CONTRAST TECHNIQUE: Multiplanar, multiecho pulse sequences of the brain and surrounding structures were obtained without intravenous contrast. COMPARISON:  11/18/2017 MRI head, correlation is also made with 08/02/2023 CT head FINDINGS: Brain: Restricted diffusion with ADC correlate left cerebellum (series 3, image 13-15) and in the right frontal cortex (series 3, images 42-44), with an additional punctate infarct in the posterior right frontal white matter (series 3, image 40). These areas are associated with increased T2 hyperintense signal. No evidence of acute hemorrhage, mass, mass effect, or midline shift. No hydrocephalus or extra-axial collection. Normal cerebral volume  for age. Vascular: Normal arterial flow voids. Skull and upper cervical spine: Normal marrow signal. Sinuses/Orbits: Mucosal thickening and air-fluid levels in the left maxillary sinus, left greater than right sphenoid sinus, ethmoid air cells, and left frontal sinus. No acute finding in the orbits. Other: Trace fluid in  the mastoid air cells. IMPRESSION: 1. Acute infarcts in the left cerebellum and right frontal cortex and white matter. Given multiple vascular territories, consider a central embolic etiology. 2. Mucosal thickening and air-fluid levels in the paranasal sinuses, which can be seen in the setting of acute sinusitis. These results will be called to the ordering clinician or representative by the Radiologist Assistant, and communication documented in the PACS or [employer]. Electronically Signed   By: Wiliam Ke M.D.   On: 08/03/2023 19:54   ECHOCARDIOGRAM COMPLETE  Result Date: 08/03/2023    ECHOCARDIOGRAM REPORT   Patient Name:   Jermaine [name] Date of Exam: 08/03/2023 Medical Rec #:  [id number]      Height:       74.0 in Accession #:    [id number]     Weight:       213.8 lb Date of Birth:  1981/07/26      BSA:          2.236 m Patient Age:    42 years       BP:           146/94 mmHg Patient Gender: M              HR:           88 bpm. [facility] Procedure: 2D Echo, Cardiac Doppler and Color Doppler Indications:    NSTEMI ;21.4  History:        Patient has no prior history of Echocardiogram examinations. CAD                 and Previous Myocardial Infarction, Arrythmias:Atrial                 Fibrillation; Risk Factors:Hypertension, Diabetes and Current                 Smoker. Altered mental status, Polysubstance abuse (HCC),                 Bipolar 1 disorder (HCC), Obstructive sleep apnea.  Sonographer:    Celesta Gentile RCS Referring Phys: [id number] Ellsworth Lennox IMPRESSIONS  1. Left ventricular ejection fraction, by estimation, is 35 to 40%. The left ventricle has mild to moderately decreased function. The left ventricle demonstrates global hypokinesis. The left ventricular internal cavity size was mildly dilated. Left ventricular diastolic parameters are indeterminate.  2. Right ventricular systolic function is normal. The right ventricular size is normal. Tricuspid regurgitation  signal is inadequate for assessing PA pressure.  3. The mitral valve is abnormal. Mild mitral valve regurgitation. No evidence of mitral stenosis.  4. The aortic valve is tricuspid. Aortic valve regurgitation is not visualized. No aortic stenosis is present.  5. The inferior vena cava is dilated in size with >50% respiratory variability, suggesting right atrial pressure of 8 mmHg. FINDINGS  Left Ventricle: Left ventricular ejection fraction, by estimation, is 35 to 40%. The left ventricle has mild to moderately decreased function. The left ventricle demonstrates global hypokinesis. The left ventricular internal cavity size was mildly dilated. There is no left ventricular hypertrophy. Left ventricular diastolic parameters are indeterminate. Right Ventricle: The right ventricular size is normal. No increase in  right ventricular wall thickness. Right ventricular systolic function is normal. Tricuspid regurgitation signal is inadequate for assessing PA pressure. Left Atrium: Left atrial size was normal in size. Right Atrium: Right atrial size was normal in size. Pericardium: There is no evidence of pericardial effusion. Mitral Valve: The mitral valve is abnormal. Mild mitral valve regurgitation. No evidence of mitral valve stenosis. Tricuspid Valve: The tricuspid valve is normal in structure. Tricuspid valve regurgitation is not demonstrated. No evidence of tricuspid stenosis. Aortic Valve: The aortic valve is tricuspid. Aortic valve regurgitation is not visualized. No aortic stenosis is present. Aortic valve mean gradient measures 3.7 mmHg. Aortic valve peak gradient measures 6.9 mmHg. Aortic valve area, by VTI measures 2.94 cm. Pulmonic Valve: The pulmonic valve was not well visualized. Pulmonic valve regurgitation is not visualized. No evidence of pulmonic stenosis. Aorta: The aortic root is normal in size and structure. Venous: The inferior vena cava is dilated in size with greater than 50% respiratory variability,  suggesting right atrial pressure of 8 mmHg. IAS/Shunts: No atrial level shunt detected by color flow Doppler.  LEFT VENTRICLE PLAX 2D LVIDd:         6.00 cm      Diastology LVIDs:         4.80 cm      LV e' medial:    7.40 cm/s LV PW:         1.10 cm      LV E/e' medial:  13.9 LV IVS:        1.00 cm      LV e' lateral:   7.40 cm/s LVOT diam:     2.30 cm      LV E/e' lateral: 13.9 LV SV:         67 LV SV Index:   30 LVOT Area:     4.15 cm  LV Volumes (MOD) LV vol d, MOD A2C: 181.0 ml LV vol d, MOD A4C: 169.0 ml LV vol s, MOD A2C: 118.0 ml LV vol s, MOD A4C: 111.0 ml LV SV MOD A2C:     63.0 ml LV SV MOD A4C:     169.0 ml LV SV MOD BP:      61.0 ml RIGHT VENTRICLE RV S prime:     10.30 cm/s TAPSE (M-mode): 1.8 cm LEFT ATRIUM             Index        RIGHT ATRIUM           Index LA diam:        4.30 cm 1.92 cm/m   RA Area:     13.00 cm LA Vol (A2C):   79.6 ml 35.60 ml/m  RA Volume:   32.70 ml  14.62 ml/m LA Vol (A4C):   46.4 ml 20.75 ml/m LA Biplane Vol: 62.9 ml 28.13 ml/m  AORTIC VALVE AV Area (Vmax):    2.86 cm AV Area (Vmean):   2.85 cm AV Area (VTI):     2.94 cm AV Vmax:           131.08 cm/s AV Vmean:          90.415 cm/s AV VTI:            0.229 m AV Peak Grad:      6.9 mmHg AV Mean Grad:      3.7 mmHg LVOT Vmax:         90.20 cm/s LVOT Vmean:        62.000  cm/s LVOT VTI:          0.162 m LVOT/AV VTI ratio: 0.71  AORTA Ao Root diam: 3.90 cm MITRAL VALVE MV Area (PHT): 4.15 cm     SHUNTS MV Decel Time: 183 msec     Systemic VTI:  0.16 m MV E velocity: 103.00 cm/s  Systemic Diam: 2.30 cm MV A velocity: 73.70 cm/s MV E/A ratio:  1.40 Dina Rich MD Electronically signed by Dina Rich MD Signature Date/Time: 08/03/2023/1:31:27 PM    Final    DG Chest Port 1 View  Result Date: 08/02/2023 CLINICAL DATA:  Shortness of breath EXAM: PORTABLE CHEST 1 VIEW COMPARISON:  07/16/2020 FINDINGS: Interstitial coarsening and hazy ground-glass opacity in the left lung. The right lung is clear. Stable  cardiomediastinal silhouette. No pleural effusion or pneumothorax. IMPRESSION: Interstitial coarsening and hazy ground-glass opacity in the left lung, suspicious for atypical pneumonia. Electronically Signed   By: Minerva Fester M.D.   On: 08/02/2023 23:48   CT Cervical Spine Wo Contrast  Result Date: 08/02/2023 CLINICAL DATA:  Found down in bathroom EXAM: CT CERVICAL SPINE WITHOUT CONTRAST TECHNIQUE: Multidetector CT imaging of the cervical spine was performed without intravenous contrast. Multiplanar CT image reconstructions were also generated. RADIATION DOSE REDUCTION: This exam was performed according to the departmental dose-optimization program which includes automated exposure control, adjustment of the mA and/or kV according to patient size and/or use of iterative reconstruction technique. COMPARISON:  CT 03/09/2023 FINDINGS: Alignment: Straightening of the cervical spine. No subluxation. Facet alignment is within normal limits. Skull base and vertebrae: No acute fracture. No primary bone lesion or focal pathologic process. Soft tissues and spinal canal: No prevertebral fluid or swelling. No visible canal hematoma. Disc levels:  Mild degenerative changes C4-C5 and C5-C6. Upper chest: Hazy left apical infiltrate or edema Other: None IMPRESSION: 1. Straightening of the cervical spine with mild degenerative changes. No acute osseous abnormality. 2. Hazy left apical infiltrate or edema. Electronically Signed   By: Jasmine Pang M.D.   On: 08/02/2023 23:16   CT Head Wo Contrast  Result Date: 08/02/2023 CLINICAL DATA:  Trauma EXAM: CT HEAD WITHOUT CONTRAST TECHNIQUE: Contiguous axial images were obtained from the base of the skull through the vertex without intravenous contrast. RADIATION DOSE REDUCTION: This exam was performed according to the departmental dose-optimization program which includes automated exposure control, adjustment of the mA and/or kV according to patient size and/or use of  iterative reconstruction technique. COMPARISON:  head CT 02/08/2023 FINDINGS: Brain: No evidence of acute infarction, hemorrhage, hydrocephalus, extra-axial collection or mass lesion/mass effect. Vascular: No hyperdense vessel or unexpected calcification. Skull: Normal. Negative for fracture or focal lesion. Sinuses/Orbits: There are air-fluid levels in the left sphenoid sinus, left frontal sinus and left sphenoid sinus. Orbits are within normal limits. Other: None. IMPRESSION: 1. No acute intracranial process. 2. Air-fluid levels in the left sphenoid sinus, left frontal sinus and left sphenoid sinus. Correlate for acute sinusitis. Electronically Signed   By: Darliss Cheney M.D.   On: 08/02/2023 23:13   DG Elbow 2 Views Right  Result Date: 08/02/2023 Right elbow AP lateral right elbow Pain along the right lateral elbow over the right lateral epicondyle AP and lateral views were taken to rule out fracture No obvious fracture seen Subtle radial head and neck fractures can be missed on initial plain films Repeat imaging if clinically necessary Impression normal right elbow   DG Wrist Complete Right  Result Date: 07/18/2023 CLINICAL DATA:  Wrist pain after injury.  Seizure like activity. EXAM: RIGHT WRIST - COMPLETE 3+ VIEW COMPARISON:  None Available. FINDINGS: There is a fracture involving the ulnar styloid with mild distraction of the fracture fragments on the order of 2 mm. No additional fracture or dislocation identified. Signs of remote deformity involving the base of the fifth meta carpal bone with volar angulation. IMPRESSION: Acute fracture involving the ulnar styloid with mild distraction of the fracture fragments. Electronically Signed   By: Signa Kell M.D.   On: 07/18/2023 13:04     PHYSICAL EXAM  Temp:  [97.5 F (36.4 C)-98.5 F (36.9 C)] 97.5 F (36.4 C) (11/14 0936) Pulse Rate:  [0-83] 69 (11/14 0948) Resp:  [11-18] 16 (11/14 0936) BP: (121-152)/(83-103) 127/93 (11/14 0948) SpO2:   [94 %-98 %] 95 % (11/14 0948) Weight:  [89.1 kg] 89.1 kg (11/14 0735)  General - Well nourished, well developed, in no apparent distress.  Ophthalmologic - fundi not visualized due to noncooperation.  Cardiovascular - Regular rhythm and rate.  Neuro - awake, alert, eyes open, orientated to age, place, time and people. No aphasia, fluent language, following all simple commands. Able to name and repeat. No gaze palsy, tracking bilaterally, visual field full, PERRL. No facial droop. Tongue midline. 5/5 in all extremities. Sensation subjectively decreased on the left face and LUE, decreased on the lateral half lf LLE, b/l FTN intact, gait not tested.     ASSESSMENT/PLAN Mr. KOBIN DETORO is a 42 y.o. male with history of substance abuse, bipolar disorder, anxiety, hypertension, hyperlipidemia, diabetes, pseudoseizure, smoker admitted for drug overdose and complaint left-sided numbness, found to have A-fib subsequently. No tPA given due to window.    Stroke:  left cerebellum small, as well as right MCA/ACA, and MCA punctate infarcts, embolic pattern may secondary to newly diagnosed A-fib CT no acute abnormality CT head and neck unremarkable MRI acute infarct in left cerebellum and right frontal cortex and white matter 2D Echo EF 35 to 40% LDL 76 HgbA1c 5.2 UDS positive for cocaine, opiates and THC Heparin IV for VTE prophylaxis No antithrombotic prior to admission, now on eliquis 5mg  bid Ongoing aggressive stroke risk factor management Therapy recommendations: Home health PT and OT Disposition: home today  Non-STEMI Cardiomyopathy Troponin [id number],306 Cardiology on board Cardiac cath pending tomorrow EF 35 to 40% On aspirin and heparin IV, now switched to eliquis 5mg  bid On Coreg  New diagnosed A-fib Paroxysmal versus lone A-fib triggered by non-STEMI Cardiology on board On heparin IV and Coreg Now heparin switched to eliquis  Diabetes HgbA1c 5.2 goal <  7.0 Controlled CBG monitoring SSI DM education and close PCP follow up  Hypertension Stable On Coreg Long term BP goal normotensive  Hyperlipidemia Home meds: None LDL 76, goal < 70 Now on Crestor 40 Continue statin at discharge  Substance abuse UDS positive for cocaine, THC and opiates Cessation education provided Patient is willing to create  Tobacco abuse Current smoker Smoking cessation counseling provided Nicotine patch provided Pt is willing to quit  Other Stroke Risk Factors   Other Active Problems Bipolar disorder Anxiety ?  Pseudoseizure Mild leukocytosis, WBC 11.4  Hospital day # 3  Neurology will sign off. Please call with questions. Pt will follow up with Dr. Terrace Arabia at [facility] in about 4 weeks. Thanks for the consult.   Marvel Plan, MD PhD Stroke Neurology 08/05/2023 1:03 PM    To contact Stroke Continuity provider, please refer to [email or web address]. After hours, contact General Neurology

## 2023-08-05 NOTE — Progress Notes (Signed)
Since patient has talked with doctor, he was told he may go home todaY after removal of TRBand, no intervention in cath lab today, patient is refusing to keep tele monitoring on,,tele cc advised
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
## 2023-08-05 NOTE — Discharge Summary (Signed)
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 Jasmine Pang M.D.   On: 08/02/2023 23:16   CT Head Wo Contrast  Result Date: 08/02/2023 CLINICAL DATA:  Trauma EXAM: CT HEAD WITHOUT CONTRAST TECHNIQUE: Contiguous axial images were obtained from the base of the skull through the vertex without intravenous contrast. RADIATION DOSE REDUCTION: This exam was performed according to the departmental dose-optimization program which includes automated exposure control, adjustment of the mA and/or kV according to patient size and/or use of iterative reconstruction technique. COMPARISON:  head CT 02/08/2023 FINDINGS: Brain: No evidence of acute infarction, hemorrhage, hydrocephalus, extra-axial collection or mass lesion/mass effect. Vascular: No hyperdense vessel or unexpected calcification. Skull: Normal. Negative for fracture or focal lesion. Sinuses/Orbits: There are air-fluid levels in the left sphenoid sinus, left frontal sinus and left sphenoid  sinus. Orbits are within normal limits. Other: None. IMPRESSION: 1. No acute intracranial process. 2. Air-fluid levels in the left sphenoid sinus, left frontal sinus and left sphenoid sinus. Correlate for acute sinusitis. Electronically Signed   By: Darliss Cheney M.D.   On: 08/02/2023 23:13   DG Elbow 2 Views Right  Result Date: 08/02/2023 Right elbow AP lateral right elbow Pain along the right lateral elbow over the right lateral epicondyle AP and lateral views were taken to rule out fracture No obvious fracture seen Subtle radial head and neck fractures can be missed on initial plain films Repeat imaging if clinically necessary Impression normal right elbow   DG Wrist Complete Right  Result Date: 07/18/2023 CLINICAL DATA:  Wrist pain after injury.  Seizure like activity. EXAM: RIGHT WRIST - COMPLETE 3+ VIEW COMPARISON:  None Available. FINDINGS: There is a fracture involving the ulnar styloid with mild distraction of the fracture fragments on the order of 2 mm. No additional fracture or dislocation identified. Signs of remote deformity involving the base of the fifth meta carpal bone with volar angulation. IMPRESSION: Acute fracture involving the ulnar styloid with mild distraction of the fracture fragments. Electronically Signed   By: Signa Kell M.D.   On: 07/18/2023 13:04    Labs: BNP (last 3 results) No results for input(s): "BNP" in the last 8760 hours. Basic Metabolic Panel: Recent Labs  Lab 08/02/23 2048 08/03/23 0146 08/04/23 0324 08/05/23 0546  NA 137 136 136 133*  K 4.5 4.6 3.6 3.6  CL 100 105 103 101  CO2 19* 20* 25 25  GLUCOSE 291* 74 114* 99  BUN 16 17 14 11   CREATININE 1.72* 1.21 1.09 1.21  CALCIUM 8.9 7.9* 8.4* 8.6*  MG  --  1.7  --   --    Liver Function Tests: Recent Labs  Lab 08/02/23 2048 08/03/23 0146  AST 47* 85*  ALT 34 40  ALKPHOS 91 61  BILITOT 0.2 0.4  PROT 7.0 6.2*  ALBUMIN 4.0 3.6   No results for input(s): "LIPASE", "AMYLASE" in the last  168 hours. No results for input(s): "AMMONIA" in the last 168 hours. CBC: Recent Labs  Lab 08/02/23 2048 08/03/23 0146 08/04/23 0324 08/05/23 0546  WBC 29.4* 25.7* 11.4* 6.2  NEUTROABS 26.7* 22.4*  --   --  HGB 15.9 14.5 13.5 13.9  HCT 46.7 41.5 38.6* 39.9  MCV 100.2* 97.9 96.3 94.5  PLT 210 178 139* 137*   Cardiac Enzymes: No results for input(s): "CKTOTAL", "CKMB", "CKMBINDEX", "TROPONINI" in the last 168 hours. BNP: Invalid input(s): "POCBNP" CBG: Recent Labs  Lab 08/04/23 1948 08/05/23 0051 08/05/23 0432 08/05/23 0737 08/05/23 1150  GLUCAP 108* 116* 92 105* 112*   D-Dimer No results for input(s): "DDIMER" in the last 72 hours. Hgb A1c Recent Labs    08/03/23 0146  HGBA1C 5.2   Lipid Profile Recent Labs    08/04/23 0324  CHOL 148  HDL 37*  LDLCALC 76  TRIG 409*  CHOLHDL 4.0   Thyroid function studies Recent Labs    08/03/23 0146  TSH 0.751   Anemia work up No results for input(s): "VITAMINB12", "FOLATE", "FERRITIN", "TIBC", "IRON", "RETICCTPCT" in the last 72 hours. Urinalysis    Component Value Date/Time   COLORURINE YELLOW 08/02/2023 2202   APPEARANCEUR HAZY (A) 08/02/2023 2202   LABSPEC 1.014 08/02/2023 2202   PHURINE 5.0 08/02/2023 2202   GLUCOSEU >=500 (A) 08/02/2023 2202   HGBUR MODERATE (A) 08/02/2023 2202   BILIRUBINUR NEGATIVE 08/02/2023 2202   KETONESUR NEGATIVE 08/02/2023 2202   PROTEINUR 100 (A) 08/02/2023 2202   UROBILINOGEN 0.2 12/18/2009 1312   NITRITE NEGATIVE 08/02/2023 2202   LEUKOCYTESUR NEGATIVE 08/02/2023 2202   Sepsis Labs Recent Labs  Lab 08/02/23 2048 08/03/23 0146 08/04/23 0324 08/05/23 0546  WBC 29.4* 25.7* 11.4* 6.2   Microbiology Recent Results (from the past 240 hour(s))  SARS Coronavirus 2 by RT PCR (hospital order, performed in [facility] hospital lab) *cepheid single result test* Anterior Nasal Swab     Status: None   Collection Time: 08/02/23 10:55 PM   Specimen: Anterior Nasal Swab  Result  Value Ref Range Status   SARS Coronavirus 2 by RT PCR NEGATIVE NEGATIVE Final    Comment: (NOTE) SARS-CoV-2 target nucleic acids are NOT DETECTED.  The SARS-CoV-2 RNA is generally detectable in upper and lower respiratory specimens during the acute phase of infection. The lowest concentration of SARS-CoV-2 viral copies this assay can detect is 250 copies / mL. A negative result does not preclude SARS-CoV-2 infection and should not be used as the sole basis for treatment or other patient management decisions.  A negative result may occur with improper specimen collection / handling, submission of specimen other than nasopharyngeal swab, presence of viral mutation(s) within the areas targeted by this assay, and inadequate number of viral copies (<250 copies / mL). A negative result must be combined with clinical observations, patient history, and epidemiological information.  Fact Sheet for Patients:   RoadLapTop.co.za  Fact Sheet for Healthcare Providers: http://kim-miller.com/  This test is not yet approved or  cleared by the [place] FDA and has been authorized for detection and/or diagnosis of SARS-CoV-2 by FDA under an Emergency Use Authorization (EUA).  This EUA will remain in effect (meaning this test can be used) for the duration of the COVID-19 declaration under Section 564(b)(1) of the Act, 21 U.S.C. section 360bbb-3(b)(1), unless the authorization is terminated or revoked sooner.  Performed at [facility], [address] [place], [place] [address]   MRSA Next Gen by PCR, Nasal     Status: Abnormal   Collection Time: 08/04/23  9:25 AM   Specimen: Nasal Mucosa; Nasal Swab  Result Value Ref Range Status   MRSA by PCR Next Gen DETECTED (A) NOT DETECTED Final    Comment:  RESULT CALLED TO, READ BACK BY AND VERIFIED WITH: C. FRANKLIN RN, AT 1255 08/04/23 D. Leighton Roach (NOTE) The GeneXpert MRSA Assay (FDA approved for NASAL  specimens only), is one component of a comprehensive MRSA colonization surveillance program. It is not intended to diagnose MRSA infection nor to guide or monitor treatment for MRSA infections. Test performance is not FDA approved in patients less than 37 years old. Performed at [facility], [address], [place], [place] [address]      Time coordinating discharge: 35 minutes  SIGNED: Lanae Boast, MD  [facility] 08/05/2023, 1:09 PM  If 7PM-7AM, please contact night-coverage [email or web address]

## 2023-08-05 NOTE — Progress Notes (Addendum)
Patient Name: Jermaine [name] Date of Encounter: 08/05/2023 [facility] Cardiologist: Dina Rich, MD   Interval Summary  .    Patient's main complaint is lower back and leg pain. He denies chest pain, palpitations, shortness of breath. Scheduled for cath today  Vital Signs .    Vitals:   08/04/23 1610 08/04/23 1951 08/05/23 0055 08/05/23 0428  BP: 129/84 (!) 140/93 (!) 152/83 133/89  Pulse: 82 83 82 66  Resp: 18 18 16 18   Temp: 98.1 F (36.7 C) 98.5 F (36.9 C) 97.9 F (36.6 C) 98 F (36.7 C)  TempSrc: Oral Oral Oral Oral  SpO2: 97% 97% 97% 95%  Weight:      Height:        Intake/Output Summary (Last 24 hours) at 08/05/2023 0726 Last data filed at 08/05/2023 0615 Gross per 24 hour  Intake 1675.01 ml  Output 450 ml  Net 1225.01 ml      08/02/2023    8:16 PM 07/18/2023   11:29 AM 07/17/2023    6:24 AM  Last 3 Weights  Weight (lbs) 213 lb 13.5 oz 215 lb 250 lb  Weight (kg) 97 kg 97.523 kg 113.399 kg      Telemetry/ECG    NSR with HR in the 60s-70s - Personally Reviewed  Physical Exam .   GEN: No acute distress.  Laying flat in the bed  Neck: No JVD Cardiac: RRR, no murmurs, rubs, or gallops. Radial pulses 2+ bilaterally  Respiratory: Clear to auscultation bilaterally. Normal work of breathing on room air  GI: Soft, nontender, non-distended  MS: No edema in BLE   Assessment & Plan .     NSTEMI  - Patient was found in a hotel unresponsive. Mentation improved with 8 g narcan  - hsTn 781>2023>6410>10,306 - Echocardiogram this admission showed EF 35-40% with global hypokinesis, normal RV function, mild MR  - Possible that MI was cocaine induced. However, patient also reports exertional chest pain over the past several months. Has a significant family history of premature MI and continues to smoke cigarettes. Likely he will have some degree of CAD  - Patient also found to have acute stroke- discussed case with neurology who were OK with cath   - Patient scheduled for cardiac catheterization today - Continue IV heparin - Continue carvedilol - Continue crestor  - Continue ASA  - Patient counseled on cocaine and tobacco cessation   Acute CVA  - Imaging showed acute infarcts in the left cerebellum and right frontal cortex  - Suspected to be cardio embolic with newly diagnosed atrial fibrillation  - Followed by neurology  - On IV heparin as above   Acute Systolic Heart Failure  - Echocardiogram this admission with EF 35-40% with global hypokinesis, normal RV function  - BNP not drawn- patient appears euvolemic on exam - Initial CXR showed hazy ground-glass opacity in the left lung suspicious for atypical pneumonia. WBC initially elevated to 29, difficult to interpret in the setting of him being found down  New onset Afib with RVR  - Maintaining NSR per telemetry  - Continue carvedilol - Continue IV heparin pending cath   HLD  - Lipid panel this admission showed LDL 76  - Continue crestor 40 mg daily   Substance Use  Tobacco use  - Urine drug screen positive for THC, cocaine. Also positive for opiates, has prescription for norco for chronic pain   - Discussed importance of tobacco cessation and cocaine avoidance   HTN -  Patient reports long history of HTN, was not on medications prior to admission  - Continue carvedilol   For questions or updates, please contact [facility] Please consult [email or web address] for contact info under        Signed, Jonita Albee, PA-C

## 2023-08-05 NOTE — Progress Notes (Signed)
Discharge instructions given. Patient verbalized understanding and all questions were answered. Per case manager, DME will be delivered to the discharge lounge.

## 2023-08-06 ENCOUNTER — Other Ambulatory Visit: Payer: Self-pay | Admitting: Orthopedic Surgery

## 2023-08-06 DIAGNOSIS — S92015D Nondisplaced fracture of body of left calcaneus, subsequent encounter for fracture with routine healing: Secondary | ICD-10-CM

## 2023-08-06 DIAGNOSIS — S92215G Nondisplaced fracture of cuboid bone of left foot, subsequent encounter for fracture with delayed healing: Secondary | ICD-10-CM

## 2023-08-06 DIAGNOSIS — S93622D Sprain of tarsometatarsal ligament of left foot, subsequent encounter: Secondary | ICD-10-CM

## 2023-08-09 MED ORDER — HYDROCODONE-ACETAMINOPHEN 5-325 MG PO TABS: 1.0000 | ORAL_TABLET | Freq: Four times a day (QID) | ORAL | 0 refills | Status: DC | PRN

## 2023-08-12 ENCOUNTER — Telehealth: Payer: Self-pay | Admitting: Orthopedic Surgery

## 2023-08-12 DIAGNOSIS — S92215G Nondisplaced fracture of cuboid bone of left foot, subsequent encounter for fracture with delayed healing: Secondary | ICD-10-CM

## 2023-08-12 DIAGNOSIS — S93622D Sprain of tarsometatarsal ligament of left foot, subsequent encounter: Secondary | ICD-10-CM

## 2023-08-12 DIAGNOSIS — S92015D Nondisplaced fracture of body of left calcaneus, subsequent encounter for fracture with routine healing: Secondary | ICD-10-CM

## 2023-08-12 MED ORDER — HYDROCODONE-ACETAMINOPHEN 5-325 MG PO TABS: 1.0000 | ORAL_TABLET | Freq: Four times a day (QID) | ORAL | 0 refills | Status: DC | PRN

## 2023-08-15 DIAGNOSIS — R748 Abnormal levels of other serum enzymes: Secondary | ICD-10-CM | POA: Insufficient documentation

## 2023-08-15 DIAGNOSIS — E785 Hyperlipidemia, unspecified: Secondary | ICD-10-CM | POA: Insufficient documentation

## 2023-08-15 DIAGNOSIS — E878 Other disorders of electrolyte and fluid balance, not elsewhere classified: Secondary | ICD-10-CM | POA: Insufficient documentation

## 2023-08-15 DIAGNOSIS — I639 Cerebral infarction, unspecified: Secondary | ICD-10-CM | POA: Insufficient documentation

## 2023-08-15 DIAGNOSIS — E782 Mixed hyperlipidemia: Secondary | ICD-10-CM | POA: Insufficient documentation

## 2023-08-16 ENCOUNTER — Other Ambulatory Visit: Payer: Self-pay | Admitting: Orthopaedic Surgery

## 2023-08-16 DIAGNOSIS — R519 Headache, unspecified: Secondary | ICD-10-CM | POA: Insufficient documentation

## 2023-08-16 DIAGNOSIS — S92215G Nondisplaced fracture of cuboid bone of left foot, subsequent encounter for fracture with delayed healing: Secondary | ICD-10-CM

## 2023-08-16 DIAGNOSIS — S93622D Sprain of tarsometatarsal ligament of left foot, subsequent encounter: Secondary | ICD-10-CM

## 2023-08-16 DIAGNOSIS — S92015D Nondisplaced fracture of body of left calcaneus, subsequent encounter for fracture with routine healing: Secondary | ICD-10-CM

## 2023-08-17 MED ORDER — HYDROCODONE-ACETAMINOPHEN 5-325 MG PO TABS: 1.0000 | ORAL_TABLET | Freq: Four times a day (QID) | ORAL | 0 refills | Status: DC | PRN

## 2023-08-23 ENCOUNTER — Other Ambulatory Visit: Payer: Self-pay | Admitting: Orthopedic Surgery

## 2023-08-23 DIAGNOSIS — S93622D Sprain of tarsometatarsal ligament of left foot, subsequent encounter: Secondary | ICD-10-CM

## 2023-08-23 DIAGNOSIS — S92015D Nondisplaced fracture of body of left calcaneus, subsequent encounter for fracture with routine healing: Secondary | ICD-10-CM

## 2023-08-23 DIAGNOSIS — S92215G Nondisplaced fracture of cuboid bone of left foot, subsequent encounter for fracture with delayed healing: Secondary | ICD-10-CM

## 2023-08-23 MED ORDER — HYDROCODONE-ACETAMINOPHEN 5-325 MG PO TABS: 1.0000 | ORAL_TABLET | Freq: Four times a day (QID) | ORAL | 0 refills | Status: DC | PRN

## 2023-08-26 ENCOUNTER — Other Ambulatory Visit: Payer: Self-pay | Admitting: Orthopedic Surgery

## 2023-08-26 DIAGNOSIS — S92015D Nondisplaced fracture of body of left calcaneus, subsequent encounter for fracture with routine healing: Secondary | ICD-10-CM

## 2023-08-26 DIAGNOSIS — S92215G Nondisplaced fracture of cuboid bone of left foot, subsequent encounter for fracture with delayed healing: Secondary | ICD-10-CM

## 2023-08-26 DIAGNOSIS — S93622D Sprain of tarsometatarsal ligament of left foot, subsequent encounter: Secondary | ICD-10-CM

## 2023-08-30 MED ORDER — HYDROCODONE-ACETAMINOPHEN 5-325 MG PO TABS: 1.0000 | ORAL_TABLET | Freq: Four times a day (QID) | ORAL | 0 refills | Status: DC | PRN

## 2023-08-30 NOTE — Telephone Encounter (Signed)
Needs pain management referral. 

## 2023-09-08 ENCOUNTER — Other Ambulatory Visit: Payer: Self-pay | Admitting: Orthopedic Surgery

## 2023-09-08 DIAGNOSIS — S92215G Nondisplaced fracture of cuboid bone of left foot, subsequent encounter for fracture with delayed healing: Secondary | ICD-10-CM

## 2023-09-08 DIAGNOSIS — S92015D Nondisplaced fracture of body of left calcaneus, subsequent encounter for fracture with routine healing: Secondary | ICD-10-CM

## 2023-09-08 DIAGNOSIS — S93622D Sprain of tarsometatarsal ligament of left foot, subsequent encounter: Secondary | ICD-10-CM

## 2023-09-08 MED ORDER — HYDROCODONE-ACETAMINOPHEN 5-325 MG PO TABS: 1.0000 | ORAL_TABLET | Freq: Four times a day (QID) | ORAL | 0 refills | Status: DC | PRN

## 2023-09-09 ENCOUNTER — Encounter: Payer: Self-pay | Admitting: Cardiology

## 2023-09-09 ENCOUNTER — Ambulatory Visit: Payer: MEDICAID | Attending: Cardiology | Admitting: Cardiology

## 2023-09-09 VITALS — BP 130/80 | HR 86 | Ht 74.0 in | Wt 207.6 lb

## 2023-09-09 DIAGNOSIS — I480 Paroxysmal atrial fibrillation: Secondary | ICD-10-CM

## 2023-09-09 DIAGNOSIS — I5020 Unspecified systolic (congestive) heart failure: Secondary | ICD-10-CM | POA: Diagnosis not present

## 2023-09-09 DIAGNOSIS — I214 Non-ST elevation (NSTEMI) myocardial infarction: Secondary | ICD-10-CM | POA: Diagnosis not present

## 2023-09-09 DIAGNOSIS — E782 Mixed hyperlipidemia: Secondary | ICD-10-CM

## 2023-09-09 DIAGNOSIS — I428 Other cardiomyopathies: Secondary | ICD-10-CM

## 2023-09-09 DIAGNOSIS — Z720 Tobacco use: Secondary | ICD-10-CM

## 2023-09-09 DIAGNOSIS — I6349 Cerebral infarction due to embolism of other cerebral artery: Secondary | ICD-10-CM

## 2023-09-09 DIAGNOSIS — I10 Essential (primary) hypertension: Secondary | ICD-10-CM | POA: Diagnosis not present

## 2023-09-09 DIAGNOSIS — F1910 Other psychoactive substance abuse, uncomplicated: Secondary | ICD-10-CM

## 2023-09-09 MED ORDER — SPIRONOLACTONE 25 MG PO TABS: 12.5000 mg | ORAL_TABLET | Freq: Every day | ORAL | 3 refills | Status: DC

## 2023-09-09 NOTE — Patient Instructions (Signed)
Medication Instructions:  Your physician has recommended you make the following change in your medication:   Start Spironolactone 12.5 mg Daily   *If you need a refill on your cardiac medications before your next appointment, please call your pharmacy*   Lab Work: Your physician recommends that you return for lab work at next lab draw ( BMET)   If you have labs (blood work) drawn today and your tests are completely normal, you will receive your results only by: MyChart Message (if you have MyChart) OR A paper copy in the mail If you have any lab test that is abnormal or we need to change your treatment, we will call you to review the results.   Testing/Procedures: Your physician has requested that you have an echocardiogram. Echocardiography is a painless test that uses sound waves to create images of your heart. It provides your doctor with information about the size and shape of your heart and how well your heart's chambers and valves are working. This procedure takes approximately one hour. There are no restrictions for this procedure. Please do NOT wear cologne, perfume, aftershave, or lotions (deodorant is allowed). Please arrive 15 minutes prior to your appointment time.  Please note: We ask at that you not bring children with you during ultrasound (echo/ vascular) testing. Due to room size and safety concerns, children are not allowed in the ultrasound rooms during exams. Our front office staff cannot provide observation of children in our lobby area while testing is being conducted. An adult accompanying a patient to their appointment will only be allowed in the ultrasound room at the discretion of the ultrasound technician under special circumstances. We apologize for any inconvenience.    Follow-Up: At [facility], you and your health needs are our priority.  As part of our continuing mission to provide you with exceptional heart care, we have created designated  Provider Care Teams.  These Care Teams include your primary Cardiologist (physician) and Advanced Practice Providers (APPs -  Physician Assistants and Nurse Practitioners) who all work together to provide you with the care you need, when you need it.  We recommend signing up for the patient portal called "MyChart".  Sign up information is provided on this After Visit Summary.  MyChart is used to connect with patients for Virtual Visits (Telemedicine).  Patients are able to view lab/test results, encounter notes, upcoming appointments, etc.  Non-urgent messages can be sent to your provider as well.   To learn more about what you can do with MyChart, go to [email or web address].    Your next appointment:   2 month(s)  Provider:   You may see Dina Rich, MD or one of the following Advanced Practice Providers on your designated Care Team:   Randall An, PA-C  Jacolyn Reedy, [place]     Other Instructions Thank you for choosing [facility]!
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
## 2023-09-13 ENCOUNTER — Ambulatory Visit: Payer: MEDICAID | Admitting: Orthopedic Surgery

## 2023-09-24 ENCOUNTER — Ambulatory Visit (HOSPITAL_COMMUNITY): Admission: RE | Admit: 2023-09-24 | Payer: MEDICAID | Source: Ambulatory Visit

## 2023-09-25 ENCOUNTER — Other Ambulatory Visit: Payer: Self-pay | Admitting: Orthopedic Surgery

## 2023-09-25 DIAGNOSIS — G90522 Complex regional pain syndrome I of left lower limb: Secondary | ICD-10-CM

## 2023-09-25 DIAGNOSIS — S92215G Nondisplaced fracture of cuboid bone of left foot, subsequent encounter for fracture with delayed healing: Secondary | ICD-10-CM

## 2023-09-25 DIAGNOSIS — S93622D Sprain of tarsometatarsal ligament of left foot, subsequent encounter: Secondary | ICD-10-CM

## 2023-09-25 DIAGNOSIS — S92015D Nondisplaced fracture of body of left calcaneus, subsequent encounter for fracture with routine healing: Secondary | ICD-10-CM

## 2023-09-27 MED ORDER — GABAPENTIN 400 MG PO CAPS: 400.0000 mg | ORAL_CAPSULE | Freq: Three times a day (TID) | ORAL | 0 refills | Status: DC

## 2023-10-19 ENCOUNTER — Encounter (HOSPITAL_COMMUNITY): Payer: Self-pay | Admitting: *Deleted

## 2023-10-19 ENCOUNTER — Inpatient Hospital Stay (HOSPITAL_COMMUNITY)
Admission: EM | Admit: 2023-10-19 | Discharge: 2023-10-23 | DRG: 093 | Disposition: A | Discharge status: Home / Self Care | Payer: MEDICAID | Attending: Internal Medicine | Admitting: Internal Medicine

## 2023-10-19 ENCOUNTER — Inpatient Hospital Stay (HOSPITAL_COMMUNITY): Payer: MEDICAID

## 2023-10-19 ENCOUNTER — Other Ambulatory Visit: Payer: Self-pay

## 2023-10-19 ENCOUNTER — Emergency Department (HOSPITAL_COMMUNITY): Payer: MEDICAID

## 2023-10-19 DIAGNOSIS — I482 Chronic atrial fibrillation, unspecified: Secondary | ICD-10-CM

## 2023-10-19 DIAGNOSIS — I10 Essential (primary) hypertension: Secondary | ICD-10-CM | POA: Diagnosis present

## 2023-10-19 DIAGNOSIS — T45516A Underdosing of anticoagulants, initial encounter: Secondary | ICD-10-CM | POA: Diagnosis present

## 2023-10-19 DIAGNOSIS — E8809 Other disorders of plasma-protein metabolism, not elsewhere classified: Secondary | ICD-10-CM | POA: Diagnosis present

## 2023-10-19 DIAGNOSIS — I110 Hypertensive heart disease with heart failure: Secondary | ICD-10-CM | POA: Diagnosis present

## 2023-10-19 DIAGNOSIS — E876 Hypokalemia: Secondary | ICD-10-CM | POA: Diagnosis present

## 2023-10-19 DIAGNOSIS — R29810 Facial weakness: Secondary | ICD-10-CM | POA: Diagnosis present

## 2023-10-19 DIAGNOSIS — Z8673 Personal history of transient ischemic attack (TIA), and cerebral infarction without residual deficits: Secondary | ICD-10-CM

## 2023-10-19 DIAGNOSIS — Z7982 Long term (current) use of aspirin: Secondary | ICD-10-CM

## 2023-10-19 DIAGNOSIS — Z6826 Body mass index (BMI) 26.0-26.9, adult: Secondary | ICD-10-CM

## 2023-10-19 DIAGNOSIS — J45909 Unspecified asthma, uncomplicated: Secondary | ICD-10-CM | POA: Diagnosis present

## 2023-10-19 DIAGNOSIS — Z79899 Other long term (current) drug therapy: Secondary | ICD-10-CM

## 2023-10-19 DIAGNOSIS — F17210 Nicotine dependence, cigarettes, uncomplicated: Secondary | ICD-10-CM | POA: Diagnosis present

## 2023-10-19 DIAGNOSIS — K5900 Constipation, unspecified: Secondary | ICD-10-CM | POA: Diagnosis present

## 2023-10-19 DIAGNOSIS — I2510 Atherosclerotic heart disease of native coronary artery without angina pectoris: Secondary | ICD-10-CM | POA: Diagnosis present

## 2023-10-19 DIAGNOSIS — G2581 Restless legs syndrome: Secondary | ICD-10-CM | POA: Diagnosis present

## 2023-10-19 DIAGNOSIS — Z91120 Patient's intentional underdosing of medication regimen due to financial hardship: Secondary | ICD-10-CM | POA: Diagnosis not present

## 2023-10-19 DIAGNOSIS — Z8249 Family history of ischemic heart disease and other diseases of the circulatory system: Secondary | ICD-10-CM

## 2023-10-19 DIAGNOSIS — K219 Gastro-esophageal reflux disease without esophagitis: Secondary | ICD-10-CM | POA: Diagnosis present

## 2023-10-19 DIAGNOSIS — Z91148 Patient's other noncompliance with medication regimen for other reason: Secondary | ICD-10-CM

## 2023-10-19 DIAGNOSIS — E119 Type 2 diabetes mellitus without complications: Secondary | ICD-10-CM | POA: Diagnosis present

## 2023-10-19 DIAGNOSIS — R29818 Other symptoms and signs involving the nervous system: Secondary | ICD-10-CM | POA: Diagnosis present

## 2023-10-19 DIAGNOSIS — E663 Overweight: Secondary | ICD-10-CM | POA: Diagnosis present

## 2023-10-19 DIAGNOSIS — R2990 Unspecified symptoms and signs involving the nervous system: Principal | ICD-10-CM

## 2023-10-19 DIAGNOSIS — F319 Bipolar disorder, unspecified: Secondary | ICD-10-CM | POA: Diagnosis present

## 2023-10-19 DIAGNOSIS — E782 Mixed hyperlipidemia: Secondary | ICD-10-CM | POA: Diagnosis present

## 2023-10-19 DIAGNOSIS — Z7901 Long term (current) use of anticoagulants: Secondary | ICD-10-CM | POA: Diagnosis not present

## 2023-10-19 DIAGNOSIS — I4891 Unspecified atrial fibrillation: Secondary | ICD-10-CM | POA: Diagnosis present

## 2023-10-19 DIAGNOSIS — E46 Unspecified protein-calorie malnutrition: Secondary | ICD-10-CM | POA: Diagnosis not present

## 2023-10-19 DIAGNOSIS — Z7951 Long term (current) use of inhaled steroids: Secondary | ICD-10-CM | POA: Diagnosis not present

## 2023-10-19 DIAGNOSIS — Z888 Allergy status to other drugs, medicaments and biological substances status: Secondary | ICD-10-CM

## 2023-10-19 DIAGNOSIS — I480 Paroxysmal atrial fibrillation: Secondary | ICD-10-CM | POA: Diagnosis not present

## 2023-10-19 DIAGNOSIS — R569 Unspecified convulsions: Secondary | ICD-10-CM | POA: Diagnosis not present

## 2023-10-19 DIAGNOSIS — Z885 Allergy status to narcotic agent status: Secondary | ICD-10-CM

## 2023-10-19 DIAGNOSIS — D696 Thrombocytopenia, unspecified: Secondary | ICD-10-CM | POA: Diagnosis present

## 2023-10-19 DIAGNOSIS — Z833 Family history of diabetes mellitus: Secondary | ICD-10-CM

## 2023-10-19 DIAGNOSIS — G8321 Monoplegia of upper limb affecting right dominant side: Principal | ICD-10-CM | POA: Diagnosis present

## 2023-10-19 DIAGNOSIS — R931 Abnormal findings on diagnostic imaging of heart and coronary circulation: Secondary | ICD-10-CM

## 2023-10-19 DIAGNOSIS — I252 Old myocardial infarction: Secondary | ICD-10-CM

## 2023-10-19 LAB — DIFFERENTIAL
Abs Immature Granulocytes: 0.01 10*3/uL (ref 0.00–0.07)
Basophils Absolute: 0 10*3/uL (ref 0.0–0.1)
Basophils Relative: 0 %
Eosinophils Absolute: 0 10*3/uL (ref 0.0–0.5)
Eosinophils Relative: 1 %
Immature Granulocytes: 0 %
Lymphocytes Relative: 20 %
Lymphs Abs: 0.8 10*3/uL (ref 0.7–4.0)
Monocytes Absolute: 0.3 10*3/uL (ref 0.1–1.0)
Monocytes Relative: 7 %
Neutro Abs: 2.9 10*3/uL (ref 1.7–7.7)
Neutrophils Relative %: 72 %

## 2023-10-19 LAB — CBC
HCT: 39.1 % (ref 39.0–52.0)
Hemoglobin: 14 g/dL (ref 13.0–17.0)
MCH: 33.7 pg (ref 26.0–34.0)
MCHC: 35.8 g/dL (ref 30.0–36.0)
MCV: 94.2 fL (ref 80.0–100.0)
Platelets: 133 10*3/uL — ABNORMAL LOW (ref 150–400)
RBC: 4.15 MIL/uL — ABNORMAL LOW (ref 4.22–5.81)
RDW: 12 % (ref 11.5–15.5)
WBC: 4 10*3/uL (ref 4.0–10.5)
nRBC: 0 % (ref 0.0–0.2)

## 2023-10-19 LAB — COMPREHENSIVE METABOLIC PANEL
ALT: 15 U/L (ref 0–44)
AST: 17 U/L (ref 15–41)
Albumin: 3.4 g/dL — ABNORMAL LOW (ref 3.5–5.0)
Alkaline Phosphatase: 46 U/L (ref 38–126)
Anion gap: 10 (ref 5–15)
BUN: 11 mg/dL (ref 6–20)
CO2: 23 mmol/L (ref 22–32)
Calcium: 8 mg/dL — ABNORMAL LOW (ref 8.9–10.3)
Chloride: 105 mmol/L (ref 98–111)
Creatinine, Ser: 1.12 mg/dL (ref 0.61–1.24)
GFR, Estimated: >60 mL/min (ref >60)
Glucose, Bld: 89 mg/dL (ref 70–99)
Potassium: 3.2 mmol/L — ABNORMAL LOW (ref 3.5–5.1)
Sodium: 138 mmol/L (ref 135–145)
Total Bilirubin: 0.7 mg/dL (ref 0.0–1.2)
Total Protein: 5.9 g/dL — ABNORMAL LOW (ref 6.5–8.1)

## 2023-10-19 LAB — PROTIME-INR
INR: 1.2 (ref 0.8–1.2)
Prothrombin Time: 15.7 s — ABNORMAL HIGH (ref 11.4–15.2)

## 2023-10-19 LAB — APTT: aPTT: 27 s (ref 24–36)

## 2023-10-19 LAB — ETHANOL: Alcohol, Ethyl (B): <10 mg/dL (ref <10)

## 2023-10-19 MED ORDER — ASPIRIN 325 MG PO TABS
325.0000 mg | ORAL_TABLET | Freq: Once | ORAL | Status: AC
  Administered 2023-10-19: 325 mg via ORAL
  Filled 2023-10-19: qty 1

## 2023-10-19 MED ORDER — IOHEXOL 350 MG/ML SOLN
75.0000 mL | Freq: Once | INTRAVENOUS | Status: AC | PRN
  Administered 2023-10-19: 75 mL via INTRAVENOUS

## 2023-10-19 MED ORDER — LEVETIRACETAM IN NACL 1000 MG/100ML IV SOLN
1000.0000 mg | Freq: Once | INTRAVENOUS | Status: AC
Start: 2023-10-19 — End: 2023-10-19
  Administered 2023-10-19: 1000 mg via INTRAVENOUS
  Filled 2023-10-19: qty 100

## 2023-10-19 NOTE — ED Triage Notes (Signed)
Pt here because he was sent home after having stroke symptoms with seizures yesterday around lunch time. Pt seen at [facility] yesterday and was discharged x 3 times per family member.   + right facial droop + right arm weakness.

## 2023-10-19 NOTE — ED Notes (Signed)
Gave pt a [place] sandwich and cola

## 2023-10-19 NOTE — ED Notes (Signed)
Patient stand-offish and reluctant to cooperate with nursing assessment. Questioned why everyone keeps asking him the same questions all of the time. This nurse explained that due to his symptoms, he will be checked at intervals to monitor his symptoms and the progression or regression of them, as well as that this nurse just came onto shift and was completing her initial shift assessment. This nurse also advised the patient that there is an order for a urine sample, to which the patient responded with "I haven't drank anything in a week"; and became aggressive stating "no one is putting a catheter in me either", even though a catheter was never mentioned.
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
## 2023-10-19 NOTE — ED Provider Notes (Signed)
Jermaine [name] EMERGENCY DEPARTMENT AT [facility] Provider Note   CSN: [id number] Arrival date & time: 10/19/23  1530     History  Chief Complaint  Patient presents with   Seizures   Facial Droop    Rt facial droop, rt arm weakness    Jermaine [name] is a 43 y.o. male.   Seizures Patient presents for strokelike symptoms.  Medical history includes seizures, polysubstance abuse, BPD, depression, anxiety, GERD, HTN, HLD, CAD, arthritis, atrial fibrillation, CVA.  Per chart review, he was last seen by neurology 5 years ago.  This was following seizure-like activity, onset in 2018.  He was previously on Depakote, Keppra, Trileptal, lithium.  At time of last annual visit, he was no longer taking Depakote or Lamictal.  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  He does state that he is currently prescribed Keppra.  He was admitted in November for new onset A-fib and acute CVA.  He was started on beta-blocker and anticoagulation.  He states that he has been taking his Eliquis but will frequently miss doses.  2 days ago, he had onset of head pressure and seizure-like activities.  He was seen multiple times at [facility].  While at [facility] yesterday, around noon, he had another seizure-like episode.  At that time, he developed strokelike symptoms with facial droop, right-sided weakness, and right-sided numbness.  He was reportedly sent home in the condition.  Symptoms have been persistent since that time.       Home Medications Prior to Admission medications   Medication Sig Start Date End Date Taking? Authorizing Provider  ACCU-CHEK GUIDE TEST test strip USE TO CHECK BLOOD GLUCOSE EVERY DAY 08/30/23   [provider]  Accu-Chek Softclix Lancets lancets daily. 08/31/23   [provider]  apixaban (ELIQUIS) 5 MG TABS tablet Take 1 tablet (5 mg total) by mouth 2 (two) times daily. 08/05/23 09/09/23  Lanae Boast, MD  Blood Glucose Monitoring Suppl  (ACCU-CHEK GUIDE ME) w/Device KIT See admin instructions. 08/31/23   [provider]  carvedilol (COREG) 3.125 MG tablet Take 1 tablet (3.125 mg total) by mouth 2 (two) times daily with a meal. 08/05/23 09/09/23  Lanae Boast, MD  gabapentin (NEURONTIN) 400 MG capsule Take 1 capsule (400 mg total) by mouth 3 (three) times daily. 09/27/23   Vickki Hearing, MD  HYDROcodone-acetaminophen (NORCO/VICODIN) 5-325 MG tablet Take 1 tablet by mouth every 6 (six) hours as needed for moderate pain (pain score 4-6). 09/08/23   Vickki Hearing, MD  losartan (COZAAR) 25 MG tablet Take 1 tablet (25 mg total) by mouth daily. 08/06/23 09/09/23  Lanae Boast, MD  PAIN RELIEF EXTRA STRENGTH 500 MG tablet Take 1,000 mg by mouth every 6 (six) hours as needed for mild pain (pain score 1-3). 07/21/23   [provider]  rosuvastatin (CRESTOR) 40 MG tablet Take 1 tablet (40 mg total) by mouth daily. 08/06/23 09/09/23  Lanae Boast, MD  spironolactone (ALDACTONE) 25 MG tablet Take 0.5 tablets (12.5 mg total) by mouth daily. 09/09/23 12/08/23  Charlsie Quest, NP  SYMBICORT 80-4.5 MCG/ACT inhaler Inhale 2 puffs into the lungs 2 (two) times daily. 08/16/23   [provider]      Allergies    Hydrocodone, Seroquel [quetiapine], and Trazodone and nefazodone    Review of Systems   Review of Systems  Neurological:  Positive for seizures, facial asymmetry, speech difficulty, weakness, numbness and headaches.  All other systems  reviewed and are negative.   Physical Exam Updated Vital Signs BP (!) 122/92 (BP Location: Left Arm)   Pulse 84   Temp 98.2 F (36.8 C) (Oral)   Resp 18   Ht 6\' 2"  (1.88 m)   SpO2 100%   BMI 26.65 kg/m  Physical Exam Vitals and nursing note reviewed.  Constitutional:      General: He is not in acute distress.    Appearance: He is well-developed. He is not toxic-appearing or diaphoretic.  HENT:     Head: Normocephalic and atraumatic.     Right Ear: External ear  normal.     Left Ear: External ear normal.     Nose: Nose normal.  Eyes:     Conjunctiva/sclera: Conjunctivae normal.  Cardiovascular:     Rate and Rhythm: Normal rate and regular rhythm.  Pulmonary:     Effort: Pulmonary effort is normal. No respiratory distress.  Abdominal:     General: There is no distension.     Palpations: Abdomen is soft.  Musculoskeletal:        General: No swelling or deformity.     Cervical back: Neck supple.  Skin:    General: Skin is warm and dry.  Neurological:     Mental Status: He is alert and oriented to person, place, and time.     Cranial Nerves: Cranial nerve deficit, dysarthria and facial asymmetry present.     Sensory: Sensory deficit present.     Motor: Weakness and pronator drift present.  Psychiatric:        Mood and Affect: Mood normal.        Behavior: Behavior normal.     ED Results / Procedures / Treatments   Labs (all labs ordered are listed, but only abnormal results are displayed) Labs Reviewed  PROTIME-INR - Abnormal; Notable for the following components:      Result Value   Prothrombin Time 15.7 (*)    All other components within normal limits  CBC - Abnormal; Notable for the following components:   RBC 4.15 (*)    Platelets 133 (*)    All other components within normal limits  COMPREHENSIVE METABOLIC PANEL - Abnormal; Notable for the following components:   Potassium 3.2 (*)    Calcium 8.0 (*)    Total Protein 5.9 (*)    Albumin 3.4 (*)    All other components within normal limits  APTT  DIFFERENTIAL  ETHANOL  RAPID URINE DRUG SCREEN, HOSP PERFORMED  URINALYSIS, ROUTINE W REFLEX MICROSCOPIC    EKG EKG Interpretation Date/Time:  Tuesday October 19 2023 17:32:16 EST Ventricular Rate:  85 PR Interval:  136 QRS Duration:  110 QT Interval:  407 QTC Calculation: 484 R Axis:   95  Text Interpretation: Sinus rhythm Borderline right axis deviation Borderline T wave abnormalities Borderline prolonged QT interval  Confirmed by Gloris Manchester ([id number]) on 10/19/2023 8:40:53 PM  Radiology MR BRAIN WO CONTRAST Result Date: 10/19/2023 CLINICAL DATA:  Neuro deficit, acute, stroke suspected EXAM: MRI HEAD WITHOUT CONTRAST TECHNIQUE: Multiplanar, multiecho pulse sequences of the brain and surrounding structures were obtained without intravenous contrast. COMPARISON:  MRI August 03, 2023 FINDINGS: Brain: No acute infarction, hemorrhage, hydrocephalus, extra-axial collection or mass lesion. Small remote left cerebellar infarct. Vascular: Major arterial flow voids are maintained. Skull and upper cervical spine: Normal marrow signal. Sinuses/Orbits: Mild paranasal sinus mucosal thickening. No acute orbital findings. Other: No mastoid effusions. IMPRESSION: No evidence of acute intracranial abnormality. Electronically Signed   By:  Feliberto Harts M.D.   On: 10/19/2023 21:48   CT ANGIO HEAD NECK W WO CM Result Date: 10/19/2023 CLINICAL DATA:  Neuro deficit, acute, stroke suspected EXAM: CT ANGIOGRAPHY HEAD AND NECK WITH AND WITHOUT CONTRAST TECHNIQUE: Multidetector CT imaging of the head and neck was performed using the standard protocol during bolus administration of intravenous contrast. Multiplanar CT image reconstructions and MIPs were obtained to evaluate the vascular anatomy. Carotid stenosis measurements (when applicable) are obtained utilizing NASCET criteria, using the distal internal carotid diameter as the denominator. RADIATION DOSE REDUCTION: This exam was performed according to the departmental dose-optimization program which includes automated exposure control, adjustment of the mA and/or kV according to patient size and/or use of iterative reconstruction technique. CONTRAST:  75mL OMNIPAQUE IOHEXOL 350 MG/ML SOLN COMPARISON:  None Available. FINDINGS: CT HEAD FINDINGS Brain: No evidence of acute infarction, hemorrhage, hydrocephalus, extra-axial collection or mass lesion/mass effect. Vascular: See below. Skull: No acute  fracture. Sinuses/Orbits: Clear sinuses.  No acute orbital findings. Other: No mastoid effusions. Review of the MIP images confirms the above findings CTA NECK FINDINGS Aortic arch: Great vessel origins are patent without significant stenosis. Right carotid system: No evidence of dissection, stenosis (50% or greater), or occlusion. Left carotid system: No evidence of dissection, stenosis (50% or greater), or occlusion. Vertebral arteries: Right dominant. No evidence of dissection, stenosis (50% or greater), or occlusion. Skeleton: No acute abnormality on limited assessment. Other neck: No acute abnormality on limited assessment. Upper chest: Visualized lung apices are clear. Review of the MIP images confirms the above findings CTA HEAD FINDINGS Anterior circulation: Bilateral intracranial ICAs, MCAs, and ACAs are patent without proximal hemodynamically significant stenosis. Posterior circulation: Bilateral intradural vertebral arteries, basilar artery and bilateral posterior cerebral arteries are patent without proximal hemodynamically significant stenosis. Venous sinuses: As permitted by contrast timing, patent. Review of the MIP images confirms the above findings IMPRESSION: 1. No emergent large vessel occlusion or proximal hemodynamically significant stenosis. 2. No evidence of acute intracranial abnormality. Electronically Signed   By: Feliberto Harts M.D.   On: 10/19/2023 19:26    Procedures Procedures    Medications Ordered in ED Medications  iohexol (OMNIPAQUE) 350 MG/ML injection 75 mL (75 mLs Intravenous Contrast Given 10/19/23 1839)  levETIRAcetam (KEPPRA) IVPB 1000 mg/100 mL premix (0 mg Intravenous Stopped 10/19/23 2100)  aspirin tablet 325 mg (325 mg Oral Given 10/19/23 2041)    ED Course/ Medical Decision Making/ A&P                                 Medical Decision Making Amount and/or Complexity of Data Reviewed Labs: ordered. Radiology: ordered.  Risk OTC drugs. Prescription  drug management. Decision regarding hospitalization.   This patient presents to the ED for concern of strokelike symptoms, this involves an extensive number of treatment options, and is a complaint that carries with it a high risk of complications and morbidity.  The differential diagnosis includes CVA, Todd's paralysis, ICH, neoplasm, complex migraine   Co morbidities that complicate the patient evaluation  seizures, polysubstance abuse, BPD, depression, anxiety, GERD, HTN, HLD, CAD, arthritis, atrial fibrillation, CVA   Additional history obtained:  Additional history obtained from patient's family member External records from outside source obtained and reviewed including EMR   Lab Tests:  I Ordered, and personally interpreted labs.  The pertinent results include: Normal kidney function, mild hypokalemia with otherwise normal electrolytes, normal hemoglobin, no leukocytosis  Imaging Studies ordered:  I ordered imaging studies including CTA head and neck, MRI brain I independently visualized and interpreted imaging which showed no acute findings I agree with the radiologist interpretation   Cardiac Monitoring: / EKG:  The patient was maintained on a cardiac monitor.  I personally viewed and interpreted the cardiac monitored which showed an underlying rhythm of: Sinus rhythm   Consultations Obtained:  I requested consultation with the neurologist, Dr. Derry Lory,  and discussed lab and imaging findings as well as pertinent plan - they recommend: Admission to [facility]   Problem List / ED Course / Critical interventions / Medication management  Patient presenting for strokelike symptoms.  Onset was around noon yesterday.  On arrival in the ED, he has right-sided facial droop with forehead sparing, right arm and leg weakness, right arm and leg diminished sensation.  Presentation concerning for LVO.  Unfortunately, onset was greater than 24 hours ago.  Patient is currently  on Eliquis but does frequently miss doses.  Workup was initiated.  CTA of head and neck did not show acute findings.  On reassessment, patient continuing to show facial asymmetry and endorsing right-sided weakness.  MRI was ordered.  I spoke with neurologist on-call, Dr. Derry Lory, who agrees with MRI.  He advised to return to 5 mg dose of aspirin.  Patient to resume Eliquis if MRI is negative.  He requests admission to [facility] for neurology evaluation and continuous EEG if needed.  Patient was admitted for further management.   Social Determinants of Health:  History of polysubstance abuse        Final Clinical Impression(s) / ED Diagnoses Final diagnoses:  Stroke-like symptoms    Rx / DC Orders ED Discharge Orders     None         Gloris Manchester, MD 10/19/23 2351

## 2023-10-19 NOTE — ED Notes (Signed)
Patient transported to MRI

## 2023-10-19 NOTE — ED Notes (Signed)
Pt is refusing to give a urine sample due to previous visits of having a drug screen done. Hospitalitis and RN notified

## 2023-10-19 NOTE — ED Notes (Addendum)
Patient transported to CT

## 2023-10-20 ENCOUNTER — Inpatient Hospital Stay (HOSPITAL_COMMUNITY): Payer: MEDICAID

## 2023-10-20 DIAGNOSIS — Z8673 Personal history of transient ischemic attack (TIA), and cerebral infarction without residual deficits: Secondary | ICD-10-CM

## 2023-10-20 DIAGNOSIS — R569 Unspecified convulsions: Secondary | ICD-10-CM | POA: Diagnosis not present

## 2023-10-20 DIAGNOSIS — I10 Essential (primary) hypertension: Secondary | ICD-10-CM

## 2023-10-20 DIAGNOSIS — E8809 Other disorders of plasma-protein metabolism, not elsewhere classified: Secondary | ICD-10-CM | POA: Insufficient documentation

## 2023-10-20 DIAGNOSIS — E876 Hypokalemia: Secondary | ICD-10-CM | POA: Insufficient documentation

## 2023-10-20 DIAGNOSIS — R29818 Other symptoms and signs involving the nervous system: Secondary | ICD-10-CM | POA: Diagnosis not present

## 2023-10-20 LAB — CBC
HCT: 36 % — ABNORMAL LOW (ref 39.0–52.0)
Hemoglobin: 12.8 g/dL — ABNORMAL LOW (ref 13.0–17.0)
MCH: 32.7 pg (ref 26.0–34.0)
MCHC: 35.6 g/dL (ref 30.0–36.0)
MCV: 91.8 fL (ref 80.0–100.0)
Platelets: 131 10*3/uL — ABNORMAL LOW (ref 150–400)
RBC: 3.92 MIL/uL — ABNORMAL LOW (ref 4.22–5.81)
RDW: 12 % (ref 11.5–15.5)
WBC: 3.2 10*3/uL — ABNORMAL LOW (ref 4.0–10.5)
nRBC: 0 % (ref 0.0–0.2)

## 2023-10-20 LAB — ECHOCARDIOGRAM COMPLETE
AR max vel: 2.94 cm2
AV Area VTI: 3.12 cm2
AV Area mean vel: 2.9 cm2
AV Mean grad: 3 mm[Hg]
AV Peak grad: 4.8 mm[Hg]
Ao pk vel: 1.1 m/s
Area-P 1/2: 2.66 cm2
Height: 74 in
S' Lateral: 4.7 cm

## 2023-10-20 LAB — MAGNESIUM: Magnesium: 1.9 mg/dL (ref 1.7–2.4)

## 2023-10-20 LAB — RAPID URINE DRUG SCREEN, HOSP PERFORMED
Amphetamines: POSITIVE — AB
Barbiturates: NOT DETECTED
Benzodiazepines: NOT DETECTED
Cocaine: NOT DETECTED
Opiates: NOT DETECTED
Tetrahydrocannabinol: POSITIVE — AB

## 2023-10-20 LAB — COMPREHENSIVE METABOLIC PANEL
ALT: 13 U/L (ref 0–44)
AST: 16 U/L (ref 15–41)
Albumin: 3 g/dL — ABNORMAL LOW (ref 3.5–5.0)
Alkaline Phosphatase: 46 U/L (ref 38–126)
Anion gap: 10 (ref 5–15)
BUN: 10 mg/dL (ref 6–20)
CO2: 23 mmol/L (ref 22–32)
Calcium: 7.9 mg/dL — ABNORMAL LOW (ref 8.9–10.3)
Chloride: 105 mmol/L (ref 98–111)
Creatinine, Ser: 1.12 mg/dL (ref 0.61–1.24)
GFR, Estimated: >60 mL/min (ref >60)
Glucose, Bld: 97 mg/dL (ref 70–99)
Potassium: 3.2 mmol/L — ABNORMAL LOW (ref 3.5–5.1)
Sodium: 138 mmol/L (ref 135–145)
Total Bilirubin: 0.5 mg/dL (ref 0.0–1.2)
Total Protein: 5.5 g/dL — ABNORMAL LOW (ref 6.5–8.1)

## 2023-10-20 LAB — LIPID PANEL
Cholesterol: 76 mg/dL (ref 0–200)
HDL: 25 mg/dL — ABNORMAL LOW (ref >40)
LDL Cholesterol: 39 mg/dL (ref 0–99)
Total CHOL/HDL Ratio: 3 {ratio}
Triglycerides: 58 mg/dL (ref <150)
VLDL: 12 mg/dL (ref 0–40)

## 2023-10-20 LAB — PHOSPHORUS: Phosphorus: 3.1 mg/dL (ref 2.5–4.6)

## 2023-10-20 LAB — HEMOGLOBIN A1C
Hgb A1c MFr Bld: 5 % (ref 4.8–5.6)
Mean Plasma Glucose: 96.8 mg/dL

## 2023-10-20 MED ORDER — ACETAMINOPHEN 650 MG RE SUPP: 650.0000 mg | Freq: Four times a day (QID) | RECTAL | Status: DC | PRN

## 2023-10-20 MED ORDER — ROSUVASTATIN CALCIUM 20 MG PO TABS: 40.0000 mg | ORAL_TABLET | Freq: Every day | ORAL | Status: DC

## 2023-10-20 MED ORDER — APIXABAN 5 MG PO TABS
5.0000 mg | ORAL_TABLET | Freq: Two times a day (BID) | ORAL | Status: DC
  Administered 2023-10-20 – 2023-10-23 (×7): 5 mg via ORAL
  Filled 2023-10-20 (×7): qty 1

## 2023-10-20 MED ORDER — ACETAMINOPHEN 325 MG PO TABS: 650.0000 mg | ORAL_TABLET | Freq: Four times a day (QID) | ORAL | Status: DC | PRN

## 2023-10-20 MED ORDER — ACETAMINOPHEN 325 MG PO TABS
650.0000 mg | ORAL_TABLET | Freq: Four times a day (QID) | ORAL | Status: DC | PRN
  Filled 2023-10-20: qty 2

## 2023-10-20 MED ORDER — LOSARTAN POTASSIUM 25 MG PO TABS: 25.0000 mg | ORAL_TABLET | Freq: Every day | ORAL | Status: DC

## 2023-10-20 MED ORDER — ENSURE ENLIVE PO LIQD
237.0000 mL | Freq: Two times a day (BID) | ORAL | Status: DC
  Administered 2023-10-21 – 2023-10-23 (×5): 237 mL via ORAL

## 2023-10-20 MED ORDER — APIXABAN 5 MG PO TABS: 5.0000 mg | ORAL_TABLET | Freq: Two times a day (BID) | ORAL | Status: DC

## 2023-10-20 MED ORDER — POTASSIUM CHLORIDE CRYS ER 20 MEQ PO TBCR
40.0000 meq | EXTENDED_RELEASE_TABLET | Freq: Once | ORAL | Status: AC
  Administered 2023-10-20: 40 meq via ORAL
  Filled 2023-10-20: qty 2

## 2023-10-20 MED ORDER — ACETAMINOPHEN 325 MG PO TABS
650.0000 mg | ORAL_TABLET | Freq: Four times a day (QID) | ORAL | Status: DC | PRN
  Administered 2023-10-20 – 2023-10-22 (×4): 650 mg via ORAL
  Filled 2023-10-20 (×3): qty 2

## 2023-10-20 MED ORDER — ROSUVASTATIN CALCIUM 20 MG PO TABS
40.0000 mg | ORAL_TABLET | Freq: Every day | ORAL | Status: DC
  Administered 2023-10-20 – 2023-10-23 (×4): 40 mg via ORAL
  Filled 2023-10-20 (×4): qty 2

## 2023-10-20 MED ORDER — CARVEDILOL 3.125 MG PO TABS: 3.1250 mg | ORAL_TABLET | Freq: Two times a day (BID) | ORAL | Status: DC

## 2023-10-20 MED ORDER — ONDANSETRON HCL 4 MG/2ML IJ SOLN: 4.0000 mg | Freq: Four times a day (QID) | INTRAMUSCULAR | Status: DC | PRN

## 2023-10-20 MED ORDER — LOSARTAN POTASSIUM 25 MG PO TABS
25.0000 mg | ORAL_TABLET | Freq: Every day | ORAL | Status: DC
  Administered 2023-10-20 – 2023-10-23 (×4): 25 mg via ORAL
  Filled 2023-10-20 (×4): qty 1

## 2023-10-20 MED ORDER — CARVEDILOL 3.125 MG PO TABS
3.1250 mg | ORAL_TABLET | Freq: Two times a day (BID) | ORAL | Status: DC
  Administered 2023-10-20 – 2023-10-23 (×6): 3.125 mg via ORAL
  Filled 2023-10-20 (×6): qty 1

## 2023-10-20 MED ORDER — ONDANSETRON HCL 4 MG PO TABS: 4.0000 mg | ORAL_TABLET | Freq: Four times a day (QID) | ORAL | Status: DC | PRN

## 2023-10-20 NOTE — Plan of Care (Signed)

## 2023-10-20 NOTE — Progress Notes (Addendum)
Not able to arouse pt to cooperate to place EEG leads. Patient appears to be sleep, As schedule allows, EEG tech will come back after the lab leave out.

## 2023-10-20 NOTE — Progress Notes (Signed)
Inpatient Rehab Admissions Coordinator:   Per therapy recommendations, patient was screened for CIR candidacy by Megan Salon, MS, CCC-SLP. At this time, Pt. does not appear to demonstrate medical necessity to justify in hospital rehabilitation/CIR. I  will not pursue a rehab consult for this Pt.   Recommend other rehab venues to be pursued.  Please contact me with any questions.  Megan Salon, MS, CCC-SLP Rehab Admissions Coordinator  [phone] (celll) [phone] (office)

## 2023-10-20 NOTE — Procedures (Signed)
Patient Name: Jermaine [name]  MRN: [id number]  Epilepsy Attending: Charlsie Quest  Referring Physician/Provider: Erick Blinks, MD  Duration: 10/20/2023 4098 to 10/21/2023 1191   Patient history: 43 y.o. male with hx of anxieety, CAD, HTN, HLD, OSA, ?seizures, emolib strokes and afibb on AC but non compliance as he can't afford it. He presents with 2-3 months episodes of pressure in his head like he is on a roller coaster or going up the mountain, then he is dazed off. Lasts about 2-3 mins and then back to his baseline. They have been increasing in frequency and reports now having about 10 a day. EEG to evaluate for seizure  Level of alertness: Awake, asleep  AEDs during EEG study: None  Technical aspects: This EEG study was done with scalp electrodes positioned according to the 10-20 International system of electrode placement. Electrical activity was reviewed with band pass filter of 1-70Hz , sensitivity of 7 uV/mm, display speed of 52mm/sec with a 60Hz  notched filter applied as appropriate. EEG data were recorded continuously and digitally stored.  Video monitoring was available and reviewed as appropriate.  Description: The posterior dominant rhythm consists of 8 Hz activity of moderate voltage (25-35 uV) seen predominantly in posterior head regions, symmetric and reactive to eye opening and eye closing. Sleep was characterized by vertex waves, sleep spindles (12 to 14 Hz), maximal frontocentral region. Hyperventilation and photic stimulation were not performed.     IMPRESSION: This study is within normal limits. No seizures or epileptiform discharges were seen throughout the recording.  A normal interictal EEG does not exclude the diagnosis of epilepsy.  [name]

## 2023-10-20 NOTE — Evaluation (Signed)
Physical Therapy Evaluation Patient Details Name: Jermaine [name] MRN: [id number] DOB: April 23, 1981 Today's Date: 10/20/2023  History of Present Illness  43 y.o. male presents to ED 1/28 after 2-day onset of seizure-like activities that lead to R-sided facial droop, numbness and weakness and increased in ear pressure. Seen at [facility] last week and discharged however symptoms persisted. MRI and CT of head show no evidence of acute abnormality. EEG ordered PMH CAD, type 2 diabetes, hyperlipidemia seizure disorder, chronic pain/anxiety disorder/bipolar disorder/depression  Clinical Impression  Pt reports increased onset of seizures since November, resulting in falls, transient R sided weakness and impaired vision. Prior to November pt was independent in mobility, and ADLs, significant other assisted with iADLs. Pt is currently limited by increased fatigue, R-sided weakness and tone, R eye visual impairments, resulting in decreased strength, balance and endurance. Pt is currently supervision for bed mobility and min A for transfers and lateral stepping along EoB. Ambulation deferred due to EEG wires still in place. Patient will benefit from intensive inpatient follow-up therapy, >3 hours/day. PT will continue to follow acutely.      If plan is discharge home, recommend the following: A little help with bathing/dressing/bathroom;A lot of help with walking and/or transfers;Assistance with cooking/housework;Direct supervision/assist for medications management;Direct supervision/assist for financial management;Assist for transportation;Help with stairs or ramp for entrance   Can travel by private vehicle    Yes    Equipment Recommendations Rolling walker (2 wheels)  Recommendations for Other Services  Rehab consult    Functional Status Assessment Patient has had a recent decline in their functional status and demonstrates the ability to make significant improvements in function in a reasonable  and predictable amount of time.     Precautions / Restrictions Precautions Precautions: Fall Precaution Comments: many falls prior to hospitalization Restrictions Weight Bearing Restrictions Per Provider Order: No      Mobility  Bed Mobility Overal bed mobility: Needs Assistance Bed Mobility: Supine to Sit, Sit to Supine     Supine to sit: Supervision Sit to supine: Supervision   General bed mobility comments: increased time and effort, to come to EoB    Transfers Overall transfer level: Needs assistance Equipment used: Rolling walker (2 wheels) Transfers: Sit to/from Stand, Bed to chair/wheelchair/BSC Sit to Stand: Min assist           General transfer comment: minA for power up and steadying in standing. able to maintain standing but requires steadying for lateral stepping to his L to come up in the bed    Ambulation/Gait               General Gait Details: deferred due to being connected to EEG     Balance Overall balance assessment: Needs assistance Sitting-balance support: Feet supported, No upper extremity supported, Single extremity supported Sitting balance-Leahy Scale: Fair     Standing balance support: Bilateral upper extremity supported, During functional activity, Reliant on assistive device for balance Standing balance-Leahy Scale: Poor Standing balance comment: benefits from outside assist for dynamic balance                             Pertinent Vitals/Pain Pain Assessment Pain Assessment: Faces Faces Pain Scale: Hurts a little bit Pain Location: generalized, Pain Descriptors / Indicators: Constant, Aching, Sore Pain Intervention(s): Limited activity within patient's tolerance, Monitored during session, Repositioned    Home Living Family/patient expects to be discharged to:: Other (Comment) [employer]) Living Arrangements: Spouse/significant other  Available Help at Discharge: Family;Available 24 hours/day Type of Home:   (handicap accessible motel room) Home Access: Level entry       Home Layout: One level Home Equipment: Hand held shower head;Grab bars - toilet;Grab bars - tub/shower Additional Comments: no DME at this time    Prior Function Prior Level of Function : Needs assist             Mobility Comments: prior to November pt was independent with mobility, in the last 2 months has been spending most of his time in bed, gets up to use bathroom ADLs Comments: significant other assists with bathing and dressing     Extremity/Trunk Assessment   Upper Extremity Assessment Upper Extremity Assessment: Defer to OT evaluation    Lower Extremity Assessment Lower Extremity Assessment: RLE deficits/detail;LLE deficits/detail RLE Deficits / Details: R hip, knee and ankle AROM limited due to weakness, when PT tries to range, cogwheel motion and increasing tone especially in hip flexion and knee extension strength grossly 2/5 RLE Sensation: decreased light touch;decreased proprioception RLE Coordination: decreased fine motor;decreased gross motor LLE Deficits / Details: L hip, knee and ankle AROM improved limited due to weakness, some tone with PROM, not as strong as R, strength grossly 2+/5 LLE Sensation: decreased light touch LLE Coordination: decreased fine motor;decreased gross motor    Cervical / Trunk Assessment Cervical / Trunk Assessment: Normal  Communication   Communication Communication: No apparent difficulties  Cognition Arousal: Alert Behavior During Therapy: Flat affect Overall Cognitive Status: Within Functional Limits for tasks assessed                                          General Comments General comments (skin integrity, edema, etc.): pt with disconjugate gaze with R eye, endorses occasional double vision, not during this sessioin but has significant tunnel vision        Assessment/Plan    PT Assessment Patient needs continued PT services  PT  Problem List Decreased strength;Decreased range of motion;Decreased activity tolerance;Decreased balance;Decreased mobility;Decreased coordination;Decreased cognition       PT Treatment Interventions DME instruction;Gait training;Functional mobility training;Therapeutic activities;Therapeutic exercise;Balance training;Neuromuscular re-education;Cognitive remediation;Patient/family education    PT Goals (Current goals can be found in the Care Plan section)  Acute Rehab PT Goals PT Goal Formulation: With patient/family Time For Goal Achievement: 11/03/23 Potential to Achieve Goals: Fair    Frequency Min 1X/week        AM-PAC PT "6 Clicks" Mobility  Outcome Measure Help needed turning from your back to your side while in a flat bed without using bedrails?: None Help needed moving from lying on your back to sitting on the side of a flat bed without using bedrails?: A Little Help needed moving to and from a bed to a chair (including a wheelchair)?: A Little Help needed standing up from a chair using your arms (e.g., wheelchair or bedside chair)?: A Little Help needed to walk in hospital room?: A Lot Help needed climbing 3-5 steps with a railing? : Total 6 Click Score: 16    End of Session Equipment Utilized During Treatment: Gait belt Activity Tolerance: Patient tolerated treatment well Patient left: in bed;with call bell/phone within reach;with bed alarm set Nurse Communication: Mobility status;Other (comment) (asking if he can get breakfast) PT Visit Diagnosis: Unsteadiness on feet (R26.81);Repeated falls (R29.6);Muscle weakness (generalized) (M62.81);History of falling (Z91.81);Difficulty in walking, not elsewhere  classified (R26.2);Other symptoms and signs involving the nervous system (Z61.096)    Time: 0454-0981 PT Time Calculation (min) (ACUTE ONLY): 18 min   Charges:   PT Evaluation $PT Eval Moderate Complexity: 1 Mod   PT General Charges $$ ACUTE PT VISIT: 1 Visit          Lometa Riggin B. Beverely Risen PT, DPT Acute Rehabilitation Services Please use secure chat or  Call Office [phone]   Elon Alas Fleet 10/20/2023, 10:15 AM

## 2023-10-20 NOTE — Plan of Care (Signed)
Problem: Education: Goal: Knowledge of General Education information will improve Description: Including pain rating scale, medication(s)/side effects and non-pharmacologic comfort measures Outcome: Progressing   Problem: Health Behavior/Discharge Planning: Goal: Ability to manage health-related needs will improve Outcome: Progressing   Problem: Safety: Goal: Ability to remain free from injury will improve Outcome: Progressing

## 2023-10-20 NOTE — TOC Initial Note (Signed)
Transition of Care [facility]) - Initial/Assessment Note    Patient Details  Name: Jermaine [name] MRN: [id number] Date of Birth: Jun 04, 1981  Transition of Care [facility] CM/SW Contact:    Kermit Balo, RN Phone Number: 10/20/2023, 11:50 AM  Clinical Narrative:                  Pt is from a motel with his girlfriend.  No DME.  He manages his own medications and does admit to forgetting his medications at times. CM asked the girlfriend if she could assist with his medications and she voiced, yes.  Pt does have issues with transportation. CM has added transportation information to his AVS to assist with getting to appointments.  Current recommendations are for CIR. Awaiting CIR eval. Pt still on LTEEG. TOC following.  Expected Discharge Plan: IP Rehab Facility Barriers to Discharge: Continued Medical Work up   Patient Goals and CMS Choice   CMS Medicare.gov Compare Post Acute Care list provided to:: Patient Choice offered to / list presented to : Patient      Expected Discharge Plan and Services   Discharge Planning Services: CM Consult Post Acute Care Choice: IP Rehab Living arrangements for the past 2 months: Hotel/Motel                                      Prior Living Arrangements/Services Living arrangements for the past 2 months: Hotel/Motel Lives with:: Significant Other Patient language and need for interpreter reviewed:: Yes Do you feel safe going back to the place where you live?: Yes        Care giver support system in place?: Yes (comment)   Criminal Activity/Legal Involvement Pertinent to Current Situation/Hospitalization: No - Comment as needed  Activities of Daily Living      Permission Sought/Granted                  Emotional Assessment Appearance:: Appears stated age Attitude/Demeanor/Rapport: Lethargic   Orientation: : Oriented to Self, Oriented to Place, Oriented to Situation   Psych Involvement: No (comment)  Admission diagnosis:   Stroke-like symptoms [R29.90] Acute focal neurological deficit [R29.818] Patient Active Problem List   Diagnosis Date Noted   Hypokalemia 10/20/2023   Hypoalbuminemia due to protein-calorie malnutrition (HCC) 10/20/2023   History of CVA (cerebrovascular accident) 10/20/2023   Acute focal neurological deficit 10/19/2023   Headache 08/16/2023   Elevated liver enzymes 08/15/2023   Ischemic stroke (HCC) 08/15/2023   Mixed hyperlipidemia 08/15/2023   Electrolyte imbalance 08/15/2023   Acute systolic heart failure (HCC) 08/04/2023   NSTEMI (non-ST elevated myocardial infarction) (HCC) 08/03/2023   Chronic pain 08/03/2023   Leukocytosis 08/03/2023   Altered mental status 08/03/2023   A-fib (HCC) 08/02/2023   Protrusion of lumbar intervertebral disc 04/25/2021   Polysubstance abuse (HCC) 12/14/2020   Substance induced mood disorder (HCC) 12/14/2020   Ankle impingement syndrome, left    Pneumonia 01/29/2020   Diabetes mellitus type 2 in nonobese [facility])    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   Drug-induced erectile dysfunction 09/27/2019   Pain in left testicle 09/27/2019   S/P right knee arthroscopy 03/16/19 03/21/2019   Old bucket handle tear of lateral meniscus of right knee    Bipolar 1 disorder, depressed (HCC) 02/26/2019   Major depressive disorder, recurrent episode (HCC) 08/25/2018   Bipolar 1 disorder (HCC) 08/01/2018   Borderline personality disorder (HCC) 08/01/2018   Obstructive sleep  apnea (adult) (pediatric) 08/01/2018   Unspecified convulsions (HCC) 08/01/2018   Obstipation 03/14/2018   Opioid dependence (HCC) 02/09/2018   Thrombocytopenia (HCC) 02/09/2018   Tobacco abuse 02/09/2018   Polypharmacy 12/23/2017   Constipation 11/23/2017   GERD (gastroesophageal reflux disease) 11/23/2017   Nausea with vomiting 11/23/2017   Rectal bleeding 11/23/2017   Abnormal weight loss 11/23/2017   Mood disorder (HCC) 10/28/2017   Seizure-like activity (HCC) 10/28/2017   Bipolar disorder  current episode depressed (HCC) 05/26/2015   Essential hypertension 05/26/2015   Suicidal ideation 05/26/2015   Bipolar disorder, current episode mixed (HCC) 05/26/2015   PCP:  Benita Stabile, MD Pharmacy:   [facility] [phone] - [place], [place] - [address] [place] [address] Phone: [phone] Fax: [phone]  [facility] [address] [place] [address] Phone: [phone] Fax: [phone]     Social Drivers of Health (SDOH) Social History: SDOH Screenings   Food Insecurity: No Food Insecurity (10/20/2023)  Housing: Unknown (10/20/2023)  Recent Concern: Housing - High Risk (08/05/2023)  Transportation Needs: No Transportation Needs (10/20/2023)  Utilities: Not At Risk (10/20/2023)  Alcohol Screen: Low Risk  (02/26/2019)  Depression (PHQ2-9): Medium Risk (12/11/2020)  Financial Resource Strain: High Risk (10/19/2023)  Physical Activity: Insufficiently Active (08/25/2018)  Social Connections: Somewhat Isolated (08/25/2018)  Stress: Stress Concern Present (08/25/2018)  Tobacco Use: High Risk (10/19/2023)   SDOH Interventions:     Readmission Risk Interventions     No data to display
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
## 2023-10-20 NOTE — Progress Notes (Signed)
LTM EEG hooked up and running - no initial skin breakdown - push button tested - [facility] monitoring.

## 2023-10-20 NOTE — Progress Notes (Signed)
PROGRESS NOTE    Jermaine [name]  ZOX:[id number] DOB: 02/15/1981 DOA: 10/19/2023 PCP: Benita Stabile, MD   Brief Narrative:    Jermaine [name] is a 43 y.o. male with medical history significant of CAD, type 2 diabetes, hyperlipidemia seizure disorder, chronic pain/anxiety disorder/bipolar disorder/depression who presents to the emergency department due to 2-day onset of seizure-like activities and increased in ear pressure.  He was admitted for concerns of acute focal neurologic deficit and possible seizure activity.  He he underwent continuous EEG video monitoring overnight with no acute findings noted.  He agrees to be noncompliant with his home medications.  Assessment & Plan:   Principal Problem:   Acute focal neurological deficit Active Problems:   Essential hypertension   A-fib (HCC)   Mixed hyperlipidemia   Hypokalemia   Hypoalbuminemia due to protein-calorie malnutrition (HCC)   History of CVA (cerebrovascular accident)  Assessment and Plan:   Acute focal neurologic deficit, rule out acute ischemic stroke. Patient presents with facial asymmetry MRI head without contrast showed no evidence of acute intracranial abnormality CT Head and neck with and without contrast showed no emergent large vessel occlusion or proximal hemodynamically significant stenosis. Echocardiogram pending Continue aspirin and statin Continue fall precautions and neuro checks LDL 39 and A1c 5% Continue PT/OT eval and treat Bedside swallow eval by nursing prior to diet Neurologist (Dr.Khaliqdina) was consulted and recommended admitting patient to [facility] per EDP. Patient has undergone overnight continuous EEG video monitoring with no significant findings noted. UDS pending   Thrombocytopenia possibly reactive Platelets 133, continue to monitor   Hypokalemia K+ 3.2, this will be replenished   Hypoalbuminemia Albumin 3.4, protein supplement will be provided   History of CVA Continue Crestor,  Eliquis, appears to be noncompliant on medications   Atrial fibrillation with RVR-improved Continue Coreg, Eliquis, appears to be noncompliant on his medications   Essential hypertension Patient is outside the window for permissive hypertension Continue Coreg, losartan   Mixed hyperlipidemia Continue Crestor   DVT prophylaxis: Eliquis Code Status: Full Family Communication: None at bedside Disposition Plan: Further evaluation per neurology pending Status is: Inpatient Remains inpatient appropriate because: Need for close monitoring.   Consultants:  Neurology  Procedures:  None  Antimicrobials:  None   Subjective: Patient seen and evaluated today with no new acute complaints or concerns. No acute concerns or events noted overnight.  He is currently undergoing EEG monitoring.  Objective: Vitals:   10/19/23 2100 10/19/23 2348 10/20/23 0339 10/20/23 0828  BP: (!) 156/91 (!) 122/92 (!) 129/90 126/85  Pulse: 78 84 85 86  Resp: 11 18 18 17   Temp:  98.2 F (36.8 C) 98.5 F (36.9 C) 98.4 F (36.9 C)  TempSrc:  Oral Oral Oral  SpO2: 96% 100% 98% 97%  Height:        Intake/Output Summary (Last 24 hours) at 10/20/2023 1015 Last data filed at 10/20/2023 0953 Gross per 24 hour  Intake 100 ml  Output 900 ml  Net -800 ml   There were no vitals filed for this visit.  Examination:  General exam: Appears calm and comfortable  Respiratory system: Clear to auscultation. Respiratory effort normal. Cardiovascular system: S1 & S2 heard, RRR.  Gastrointestinal system: Abdomen is soft Central nervous system: Alert and awake Extremities: No edema Skin: No significant lesions noted Psychiatry: Flat affect.    Data Reviewed: I have personally reviewed following labs and imaging studies  CBC: Recent Labs  Lab 10/19/23 1731 10/20/23 0537  WBC 4.0  3.2*  NEUTROABS 2.9  --   HGB 14.0 12.8*  HCT 39.1 36.0*  MCV 94.2 91.8  PLT 133* 131*   Basic Metabolic Panel: Recent  Labs  Lab 10/19/23 1731 10/20/23 0537  NA 138 138  K 3.2* 3.2*  CL 105 105  CO2 23 23  GLUCOSE 89 97  BUN 11 10  CREATININE 1.12 1.12  CALCIUM 8.0* 7.9*  MG  --  1.9  PHOS  --  3.1   GFR: CrCl cannot be calculated (Unknown ideal weight.). Liver Function Tests: Recent Labs  Lab 10/19/23 1731 10/20/23 0537  AST 17 16  ALT 15 13  ALKPHOS 46 46  BILITOT 0.7 0.5  PROT 5.9* 5.5*  ALBUMIN 3.4* 3.0*   No results for input(s): "LIPASE", "AMYLASE" in the last 168 hours. No results for input(s): "AMMONIA" in the last 168 hours. Coagulation Profile: Recent Labs  Lab 10/19/23 1731  INR 1.2   Cardiac Enzymes: No results for input(s): "CKTOTAL", "CKMB", "CKMBINDEX", "TROPONINI" in the last 168 hours. BNP (last 3 results) No results for input(s): "PROBNP" in the last 8760 hours. HbA1C: Recent Labs    10/20/23 0537  HGBA1C 5.0   CBG: No results for input(s): "GLUCAP" in the last 168 hours. Lipid Profile: Recent Labs    10/20/23 0537  CHOL 76  HDL 25*  LDLCALC 39  TRIG 58  CHOLHDL 3.0   Thyroid Function Tests: No results for input(s): "TSH", "T4TOTAL", "FREET4", "T3FREE", "THYROIDAB" in the last 72 hours. Anemia Panel: No results for input(s): "VITAMINB12", "FOLATE", "FERRITIN", "TIBC", "IRON", "RETICCTPCT" in the last 72 hours. Sepsis Labs: No results for input(s): "PROCALCITON", "LATICACIDVEN" in the last 168 hours.  No results found for this or any previous visit (from the past 240 hours).       Radiology Studies: Overnight EEG with video Result Date: 10/20/2023 Charlsie Quest, MD     10/20/2023  9:14 AM Patient Name: Jermaine [name] MRN: [id number] Epilepsy Attending: Charlsie Quest Referring Physician/Provider: Erick Blinks, MD Duration: 10/20/2023 2440 to 0915 Patient history: 43 y.o. male with hx of anxieety, CAD, HTN, HLD, OSA, ?seizures, emolib strokes and afibb on [facility] but non compliance as he can't afford it. He presents with 2-3 months episodes  of pressure in his head like he is on a roller coaster or going up the mountain, then he is dazed off. Lasts about 2-3 mins and then back to his baseline. They have been increasing in frequency and reports now having about 10 a day. EEG to evaluate for seizure Level of alertness: Awake, asleep AEDs during EEG study: None Technical aspects: This EEG study was done with scalp electrodes positioned according to the 10-20 International system of electrode placement. Electrical activity was reviewed with band pass filter of 1-70Hz , sensitivity of 7 uV/mm, display speed of 39mm/sec with a 60Hz  notched filter applied as appropriate. EEG data were recorded continuously and digitally stored.  Video monitoring was available and reviewed as appropriate. Description: The posterior dominant rhythm consists of 8 Hz activity of moderate voltage (25-35 uV) seen predominantly in posterior head regions, symmetric and reactive to eye opening and eye closing. Sleep was characterized by vertex waves, sleep spindles (12 to 14 Hz), maximal frontocentral region. Hyperventilation and photic stimulation were not performed.   IMPRESSION: This study is within normal limits. No seizures or epileptiform discharges were seen throughout the recording. A normal interictal EEG does not exclude the diagnosis of epilepsy. Charlsie Quest   MR BRAIN  WO CONTRAST Result Date: 10/19/2023 CLINICAL DATA:  Neuro deficit, acute, stroke suspected EXAM: MRI HEAD WITHOUT CONTRAST TECHNIQUE: Multiplanar, multiecho pulse sequences of the brain and surrounding structures were obtained without intravenous contrast. COMPARISON:  MRI August 03, 2023 FINDINGS: Brain: No acute infarction, hemorrhage, hydrocephalus, extra-axial collection or mass lesion. Small remote left cerebellar infarct. Vascular: Major arterial flow voids are maintained. Skull and upper cervical spine: Normal marrow signal. Sinuses/Orbits: Mild paranasal sinus mucosal thickening. No acute  orbital findings. Other: No mastoid effusions. IMPRESSION: No evidence of acute intracranial abnormality. Electronically Signed   By: Feliberto Harts M.D.   On: 10/19/2023 21:48   CT ANGIO HEAD NECK W WO CM Result Date: 10/19/2023 CLINICAL DATA:  Neuro deficit, acute, stroke suspected EXAM: CT ANGIOGRAPHY HEAD AND NECK WITH AND WITHOUT CONTRAST TECHNIQUE: Multidetector CT imaging of the head and neck was performed using the standard protocol during bolus administration of intravenous contrast. Multiplanar CT image reconstructions and MIPs were obtained to evaluate the vascular anatomy. Carotid stenosis measurements (when applicable) are obtained utilizing NASCET criteria, using the distal internal carotid diameter as the denominator. RADIATION DOSE REDUCTION: This exam was performed according to the departmental dose-optimization program which includes automated exposure control, adjustment of the mA and/or kV according to patient size and/or use of iterative reconstruction technique. CONTRAST:  75mL OMNIPAQUE IOHEXOL 350 MG/ML SOLN COMPARISON:  None Available. FINDINGS: CT HEAD FINDINGS Brain: No evidence of acute infarction, hemorrhage, hydrocephalus, extra-axial collection or mass lesion/mass effect. Vascular: See below. Skull: No acute fracture. Sinuses/Orbits: Clear sinuses.  No acute orbital findings. Other: No mastoid effusions. Review of the MIP images confirms the above findings CTA NECK FINDINGS Aortic arch: Great vessel origins are patent without significant stenosis. Right carotid system: No evidence of dissection, stenosis (50% or greater), or occlusion. Left carotid system: No evidence of dissection, stenosis (50% or greater), or occlusion. Vertebral arteries: Right dominant. No evidence of dissection, stenosis (50% or greater), or occlusion. Skeleton: No acute abnormality on limited assessment. Other neck: No acute abnormality on limited assessment. Upper chest: Visualized lung apices are clear.  Review of the MIP images confirms the above findings CTA HEAD FINDINGS Anterior circulation: Bilateral intracranial ICAs, MCAs, and ACAs are patent without proximal hemodynamically significant stenosis. Posterior circulation: Bilateral intradural vertebral arteries, basilar artery and bilateral posterior cerebral arteries are patent without proximal hemodynamically significant stenosis. Venous sinuses: As permitted by contrast timing, patent. Review of the MIP images confirms the above findings IMPRESSION: 1. No emergent large vessel occlusion or proximal hemodynamically significant stenosis. 2. No evidence of acute intracranial abnormality. Electronically Signed   By: Feliberto Harts M.D.   On: 10/19/2023 19:26        Scheduled Meds:  apixaban  5 mg Oral BID   carvedilol  3.125 mg Oral BID WC   feeding supplement  237 mL Oral BID BM   losartan  25 mg Oral Daily   potassium chloride  40 mEq Oral Once   rosuvastatin  40 mg Oral Daily     LOS: 1 day    Time spent: 55 minutes    Milo Schreier Hoover Brunette, DO [facility]  If 7PM-7AM, please contact night-coverage [email or web address] 10/20/2023, 10:15 AM

## 2023-10-20 NOTE — Plan of Care (Signed)
  Problem: Education: Goal: Knowledge of disease or condition will improve Outcome: Progressing Goal: Knowledge of patient specific risk factors will improve (DELETE if not current risk factor) Outcome: Progressing   Problem: Nutrition: Goal: Risk of aspiration will decrease Outcome: Progressing Goal: Dietary intake will improve Outcome: Progressing

## 2023-10-20 NOTE — Progress Notes (Addendum)
LTM maint complete - no skin breakdown under: Fp1 Fp2  Head wrap placed [facility] monitored, Event button test confirmed by [facility].

## 2023-10-20 NOTE — Plan of Care (Signed)

## 2023-10-21 DIAGNOSIS — I480 Paroxysmal atrial fibrillation: Secondary | ICD-10-CM

## 2023-10-21 DIAGNOSIS — R569 Unspecified convulsions: Secondary | ICD-10-CM | POA: Diagnosis not present

## 2023-10-21 DIAGNOSIS — I10 Essential (primary) hypertension: Secondary | ICD-10-CM | POA: Diagnosis not present

## 2023-10-21 DIAGNOSIS — R29818 Other symptoms and signs involving the nervous system: Secondary | ICD-10-CM | POA: Diagnosis not present

## 2023-10-21 DIAGNOSIS — R931 Abnormal findings on diagnostic imaging of heart and coronary circulation: Secondary | ICD-10-CM

## 2023-10-21 DIAGNOSIS — E876 Hypokalemia: Secondary | ICD-10-CM | POA: Diagnosis not present

## 2023-10-21 LAB — CBC
HCT: 38.5 % — ABNORMAL LOW (ref 39.0–52.0)
Hemoglobin: 13.7 g/dL (ref 13.0–17.0)
MCH: 32.9 pg (ref 26.0–34.0)
MCHC: 35.6 g/dL (ref 30.0–36.0)
MCV: 92.3 fL (ref 80.0–100.0)
Platelets: 141 10*3/uL — ABNORMAL LOW (ref 150–400)
RBC: 4.17 MIL/uL — ABNORMAL LOW (ref 4.22–5.81)
RDW: 12 % (ref 11.5–15.5)
WBC: 4.9 10*3/uL (ref 4.0–10.5)
nRBC: 0 % (ref 0.0–0.2)

## 2023-10-21 LAB — BASIC METABOLIC PANEL
Anion gap: 10 (ref 5–15)
BUN: 9 mg/dL (ref 6–20)
CO2: 25 mmol/L (ref 22–32)
Calcium: 8.1 mg/dL — ABNORMAL LOW (ref 8.9–10.3)
Chloride: 103 mmol/L (ref 98–111)
Creatinine, Ser: 1.12 mg/dL (ref 0.61–1.24)
GFR, Estimated: >60 mL/min (ref >60)
Glucose, Bld: 89 mg/dL (ref 70–99)
Potassium: 3.1 mmol/L — ABNORMAL LOW (ref 3.5–5.1)
Sodium: 138 mmol/L (ref 135–145)

## 2023-10-21 LAB — MAGNESIUM: Magnesium: 1.8 mg/dL (ref 1.7–2.4)

## 2023-10-21 MED ORDER — POTASSIUM CHLORIDE CRYS ER 20 MEQ PO TBCR
40.0000 meq | EXTENDED_RELEASE_TABLET | ORAL | Status: AC
Start: 2023-10-21 — End: 2023-10-21
  Administered 2023-10-21 (×2): 40 meq via ORAL
  Filled 2023-10-21 (×2): qty 2

## 2023-10-21 MED ORDER — DIPHENHYDRAMINE HCL 25 MG PO CAPS
25.0000 mg | ORAL_CAPSULE | Freq: Once | ORAL | Status: AC
  Administered 2023-10-21: 25 mg via ORAL
  Filled 2023-10-21: qty 1

## 2023-10-21 MED ORDER — MAGNESIUM SULFATE 2 GM/50ML IV SOLN
2.0000 g | Freq: Once | INTRAVENOUS | Status: AC
  Administered 2023-10-21: 2 g via INTRAVENOUS
  Filled 2023-10-21: qty 50

## 2023-10-21 MED ORDER — ORAL CARE MOUTH RINSE: 15.0000 mL | OROMUCOSAL | Status: DC | PRN

## 2023-10-21 NOTE — Evaluation (Signed)
Occupational Therapy Evaluation Patient Details Name: Jermaine [name] MRN: [id number] DOB: 02/13/1981 Today's Date: 10/21/2023   History of Present Illness 43 y.o. male presents to ED 1/28 after 2-day onset of seizure-like activities that lead to R-sided facial droop, numbness and weakness and increased in ear pressure. Seen at [facility] last week and discharged however symptoms persisted. MRI and CT of head show no evidence of acute abnormality. EEG ordered PMH CAD, type 2 diabetes, hyperlipidemia seizure disorder, chronic pain/anxiety disorder/bipolar disorder/depression   Clinical Impression   PTA pt lives independently in a motel with his girlfriend. Pt seen while on EEG. Pt most likely close to baseline level of function and states his girlfriend can assist with medication and financial management after DC. Pt demonstrates mild deficits with memory, which he states is normal for him. Acute OT will follow to facilitate safe DC home but but not anticipate the need for OT follow up.       If plan is discharge home, recommend the following: Direct supervision/assist for medications management;Direct supervision/assist for financial management;Assist for transportation    Functional Status Assessment  Patient has had a recent decline in their functional status and demonstrates the ability to make significant improvements in function in a reasonable and predictable amount of time.  Equipment Recommendations   (RW)    Recommendations for Other Services       Precautions / Restrictions Precautions Precautions: Fall Precaution Comments: Pt. reported fatique in standing and wanted to use the RW for his own safety Restrictions Weight Bearing Restrictions Per Provider Order: No      Mobility Bed Mobility Overal bed mobility: Needs Assistance Bed Mobility: Supine to Sit, Sit to Supine     Supine to sit: Supervision Sit to supine: Supervision        Transfers Overall  transfer level: Needs assistance Equipment used: Rolling walker (2 wheels) Transfers: Sit to/from Stand Sit to Stand: Contact guard assist                  Balance Overall balance assessment: Needs assistance Sitting-balance support: No upper extremity supported, Feet supported Sitting balance-Leahy Scale: Good     Standing balance support: Reliant on assistive device for balance Standing balance-Leahy Scale: Fair                             ADL either performed or assessed with clinical judgement   ADL Overall ADL's : Needs assistance/impaired Eating/Feeding: Independent;Set up   Grooming: Wash/dry face;Wash/dry hands;Supervision/safety;Standing   Upper Body Bathing: Set up;Supervision/ safety   Lower Body Bathing: Sit to/from stand;Supervison/ safety;Set up   Upper Body Dressing : Set up;Sitting   Lower Body Dressing: Sit to/from stand;Set up;Supervision/safety   Toilet Transfer: Ambulation;Regular Toilet;Contact guard assist   Toileting- Clothing Manipulation and Hygiene: Supervision/safety;Sit to/from stand       Functional mobility during ADLs: Contact guard assist;Rolling walker (2 wheels)       Vision Baseline Vision/History: 1 Wears glasses Ability to See in Adequate Light: 1 Impaired Patient Visual Report: Blurring of vision;Eye fatigue/eye pain/headache (Pt reported some blurring of vision but that it was not different and can make an eye appointment on his own.  He was able to read a wall sign and the clock in very low light) Vision Assessment?: Wears glasses for reading     Perception         Praxis  Pertinent Vitals/Pain Pain Assessment Pain Location: entire body Pain Descriptors / Indicators: Aching     Extremity/Trunk Assessment Upper Extremity Assessment Upper Extremity Assessment: RUE deficits/detail RUE Deficits / Details: Upon MMT patient with decreased strengthe as compaired to LUE, however during functional  tasks no deficit noted.RUE grossly 4/5; pt with inconsistent use of RUE on command however using functionally wihtout difficulty RUE Sensation:  (reports numbness in hand - not a new occurance)   Lower Extremity Assessment Lower Extremity Assessment: Defer to PT evaluation   Cervical / Trunk Assessment Cervical / Trunk Assessment: Normal   Communication Communication Communication: No apparent difficulties   Cognition Arousal: Alert Behavior During Therapy: Anxious Overall Cognitive Status: Within Functional Limits for tasks assessed                                 General Comments: Assessed with the Short Blessed Test of Memory and Concentration. Scored a 4 which places him WNL; demonstrated diffeiculty with working memory, which per pt is his baseline     General Comments       Exercises     Shoulder Instructions      Home Living Family/patient expects to be discharged to:: Other (Comment) [employer]) Living Arrangements: Spouse/significant other Available Help at Discharge: Available 24 hours/day;Friend(s) Type of Home: Other(Comment) Home Access: Level entry     Home Layout: One level     Bathroom Shower/Tub: Tub/shower unit;Curtain   Firefighter: Handicapped height Bathroom Accessibility: Yes How Accessible: Accessible via walker Home Equipment: Hand held shower head;Grab bars - toilet;Grab bars - tub/shower   Additional Comments: No seat in the shower at this time      Prior Functioning/Environment Prior Level of Function : Independent/Modified Independent  Cognitive Assist :  (including meds and finances (IADLS))             ADLs Comments: pt. reported he was able to do ADLS without assist, gf assists with meds and finances        OT Problem List: Decreased strength;Decreased activity tolerance;Decreased safety awareness;Impaired balance (sitting and/or standing)      OT Treatment/Interventions: Self-care/ADL training;Therapeutic  exercise;DME and/or AE instruction;Therapeutic activities;Balance training;Patient/family education    OT Goals(Current goals can be found in the care plan section) Acute Rehab OT Goals Patient Stated Goal: to rest OT Goal Formulation: With patient Time For Goal Achievement: 11/04/23 Potential to Achieve Goals: Good  OT Frequency: Min 1X/week    Co-evaluation              AM-PAC OT "6 Clicks" Daily Activity     Outcome Measure Help from another person eating meals?: None Help from another person taking care of personal grooming?: A Little Help from another person toileting, which includes using toliet, bedpan, or urinal?: A Little Help from another person bathing (including washing, rinsing, drying)?: A Little Help from another person to put on and taking off regular upper body clothing?: A Little Help from another person to put on and taking off regular lower body clothing?: A Little 6 Click Score: 19   End of Session Equipment Utilized During Treatment: Gait belt;Rolling walker (2 wheels) Nurse Communication: Mobility status  Activity Tolerance: Patient limited by fatigue Patient left: in bed;with bed alarm set;with call bell/phone within reach  OT Visit Diagnosis: Unsteadiness on feet (R26.81);Muscle weakness (generalized) (M62.81);Pain Pain - part of body:  ("all over")  Time: 1131-1155 OT Time Calculation (min): 24 min Charges:  OT General Charges $OT Visit: 1 Visit OT Evaluation $OT Eval Low Complexity: 1 Low OT Treatments $Self Care/Home Management : 8-22 mins  Luisa Dago, OT/L   Acute OT Clinical Specialist Acute Rehabilitation Services Pager [phone] Office [phone]   [facility] 10/21/2023, 2:04 PM

## 2023-10-21 NOTE — Plan of Care (Signed)

## 2023-10-21 NOTE — Progress Notes (Signed)
PROGRESS NOTE    Jermaine [name]  ZOX:[id number] DOB: 1981/02/25 DOA: 10/19/2023 PCP: Benita Stabile, MD    Chief Complaint  Patient presents with   Seizures   Facial Droop    Rt facial droop, rt arm weakness    Brief Narrative:  Jermaine [name] is a 43 y.o. male with medical history significant of CAD, type 2 diabetes, hyperlipidemia seizure disorder, chronic pain/anxiety disorder/bipolar disorder/depression who presents to the emergency department due to 2-day onset of seizure-like activities and increased in ear pressure.  He was admitted for concerns of acute focal neurologic deficit and possible seizure activity.  He he underwent continuous EEG video monitoring overnight with no acute findings noted.  Patient noted to be noncompliant with his home medications    Assessment & Plan:   Principal Problem:   Acute focal neurological deficit Active Problems:   Essential hypertension   A-fib (HCC)   Mixed hyperlipidemia   Hypokalemia   Hypoalbuminemia due to protein-calorie malnutrition (HCC)   History of CVA (cerebrovascular accident)   Abnormal echocardiogram  #1 acute focal neurological deficits, rule out acute ischemic stroke -Patient noted to have presented with some facial symmetry, concern for seizure-like activities. -CT angiogram head and neck done with no LVO or proximal hemodynamically significant stenosis. -MRI head done negative for any acute intracranial abnormality. -2D echo with EF of 40 to 45%, left ventricular global hypokinesis, grade 1 DD, no atrial level shunt detected by color-flow Doppler. -Patient with no signs or symptoms of infection. -UDS done positive for amphetamines and THC however patient adamantly denies any recent use of amphetamines. -Patient on LTM EEG, with EEG findings so far with no seizures or epileptiform discharges seen throughout the recording. -Continue aspirin, statin. -Neurology following and appreciate input and recommendations.  2.   Hypokalemia -Potassium at 3.1, magnesium at 1.8. -Magnesium sulfate 2 g IV x 1. -K-Dur 40 mEq p.o. every 4 hours x 2 doses. -Repeat labs in AM.  3.  Thrombocytopenia -Felt likely reactive. -Patient with no overt bleeding. -Platelet count slowly improving daily.  4.  History of CVA -Continue Crestor, Eliquis for secondary stroke prophylaxis. -Per Dr.Shah, patient noted to be noncompliant with his medications.  5.  A-fib with RVR -Currently rate controlled on Coreg. -Eliquis for anticoagulation. -Patient with history of noncompliance.  6.  Hypertension -Continue Coreg, losartan.  7.  Hyperlipidemia -Continue Crestor.  8.  Abnormal 2D echo --2D echo with EF of 40 to 45%, left ventricular global hypokinesis, grade 1 DD, no atrial level shunt detected by color-flow Doppler. -2D echo unchanged from prior 2D echo. -Patient noted to have a cardiac catheterization done 08/05/2023 with no angiographic evidence of CAD, normal LV filling pressures and recommendations at that time was no further ischemic workup. -Outpatient follow-up with PCP.    DVT prophylaxis: Eliquis Code Status: Full Family Communication: Updated patient.  No family at bedside. Disposition: TBD  Status is: Inpatient Remains inpatient appropriate because: Severity of illness.    Consultants:  Neurology: Dr.Khaliqdina 10/20/2023  Procedures:  LTM EEG CT angiogram head 10/19/2023 MRI brain 10/19/2023 2D echo 10/20/2023   Antimicrobials: Anti-infectives (From admission, onward)    None         Subjective: Patient laying in bed with EEG electrodes on.  Denies any chest pain or shortness of breath.  Denies any abdominal pain.  Denies any recent use of amphetamines states last time he used amphetamines was several months ago and not sure why UDS was positive for  it.  Patient with complaints of left great toe numbness.  Objective: Vitals:   10/21/23 0355 10/21/23 0811 10/21/23 1220 10/21/23 1552  BP:  123/80 115/78 130/69 126/81  Pulse: 76 71 72 82  Resp: 18 17 17 17   Temp: 98.4 F (36.9 C) 98.7 F (37.1 C) 97.9 F (36.6 C) 98.3 F (36.8 C)  TempSrc: Oral Oral Oral Oral  SpO2: 99% 98% 100% 99%  Height:        Intake/Output Summary (Last 24 hours) at 10/21/2023 1627 Last data filed at 10/21/2023 1500 Gross per 24 hour  Intake --  Output 3400 ml  Net -3400 ml   There were no vitals filed for this visit.  Examination:  General exam: Appears calm and comfortable  Respiratory system: Clear to auscultation anterior lung fields.  No wheezes, no crackles, no rhonchi.  Fair air movement.Marland Kitchen Respiratory effort normal. Cardiovascular system: S1 & S2 heard, RRR. No JVD, murmurs, rubs, gallops or clicks. No pedal edema. Gastrointestinal system: Abdomen is nondistended, soft and nontender. No organomegaly or masses felt. Normal bowel sounds heard. Central nervous system: Alert and oriented.  Moving extremities spontaneously.  No focal neurological deficits. Extremities: Symmetric 5 x 5 power. Skin: No rashes, lesions or ulcers Psychiatry: Judgement and insight appear normal. Mood & affect appropriate.     Data Reviewed: I have personally reviewed following labs and imaging studies  CBC: Recent Labs  Lab 10/19/23 1731 10/20/23 0537 10/21/23 0601  WBC 4.0 3.2* 4.9  NEUTROABS 2.9  --   --   HGB 14.0 12.8* 13.7  HCT 39.1 36.0* 38.5*  MCV 94.2 91.8 92.3  PLT 133* 131* 141*    Basic Metabolic Panel: Recent Labs  Lab 10/19/23 1731 10/20/23 0537 10/21/23 0601  NA 138 138 138  K 3.2* 3.2* 3.1*  CL 105 105 103  CO2 23 23 25   GLUCOSE 89 97 89  BUN 11 10 9   CREATININE 1.12 1.12 1.12  CALCIUM 8.0* 7.9* 8.1*  MG  --  1.9 1.8  PHOS  --  3.1  --     GFR: CrCl cannot be calculated (Unknown ideal weight.).  Liver Function Tests: Recent Labs  Lab 10/19/23 1731 10/20/23 0537  AST 17 16  ALT 15 13  ALKPHOS 46 46  BILITOT 0.7 0.5  PROT 5.9* 5.5*  ALBUMIN 3.4* 3.0*     CBG: No results for input(s): "GLUCAP" in the last 168 hours.   No results found for this or any previous visit (from the past 240 hours).       Radiology Studies: ECHOCARDIOGRAM COMPLETE Result Date: 10/20/2023    ECHOCARDIOGRAM REPORT   Patient Name:   Jermaine [name] Date of Exam: 10/20/2023 Medical Rec #:  [id number]      Height:       74.0 in Accession #:    [id number]     Weight:       207.6 lb Date of Birth:  Jun 07, 1981      BSA:          2.208 m Patient Age:    42 years       BP:           149/84 mmHg Patient Gender: M              HR:           76 bpm. Exam Location:  Inpatient Procedure: 2D Echo, Cardiac Doppler and Color Doppler Indications:    Stroke  History:  Patient has prior history of Echocardiogram examinations, most                 recent 08/03/2023. CAD and Previous Myocardial Infarction; Risk                 Factors:Hypertension and Diabetes.  Sonographer:    Darlys Gales Referring Phys: [id number] OLADAPO ADEFESO IMPRESSIONS  1. Left ventricular ejection fraction, by estimation, is 40 to 45%. The left ventricle has mildly decreased function. The left ventricle demonstrates global hypokinesis. The left ventricular internal cavity size was mildly to moderately dilated. Left ventricular diastolic parameters are consistent with Grade I diastolic dysfunction (impaired relaxation).  2. Right ventricular systolic function is normal. The right ventricular size is normal.  3. The mitral valve is normal in structure. Trivial mitral valve regurgitation. No evidence of mitral stenosis.  4. The aortic valve is normal in structure. Aortic valve regurgitation is not visualized. No aortic stenosis is present.  5. The inferior vena cava is normal in size with greater than 50% respiratory variability, suggesting right atrial pressure of 3 mmHg. FINDINGS  Left Ventricle: Left ventricular ejection fraction, by estimation, is 40 to 45%. The left ventricle has mildly decreased function. The left  ventricle demonstrates global hypokinesis. The left ventricular internal cavity size was mildly to moderately dilated. There is no left ventricular hypertrophy. Left ventricular diastolic parameters are consistent with Grade I diastolic dysfunction (impaired relaxation). Right Ventricle: The right ventricular size is normal. No increase in right ventricular wall thickness. Right ventricular systolic function is normal. Left Atrium: Left atrial size was normal in size. Right Atrium: Right atrial size was normal in size. Pericardium: There is no evidence of pericardial effusion. Mitral Valve: The mitral valve is normal in structure. Trivial mitral valve regurgitation. No evidence of mitral valve stenosis. Tricuspid Valve: The tricuspid valve is normal in structure. Tricuspid valve regurgitation is not demonstrated. No evidence of tricuspid stenosis. Aortic Valve: The aortic valve is normal in structure. Aortic valve regurgitation is not visualized. No aortic stenosis is present. Aortic valve mean gradient measures 3.0 mmHg. Aortic valve peak gradient measures 4.8 mmHg. Aortic valve area, by VTI measures 3.12 cm. Pulmonic Valve: The pulmonic valve was normal in structure. Pulmonic valve regurgitation is trivial. No evidence of pulmonic stenosis. Aorta: The aortic root is normal in size and structure. Venous: The inferior vena cava is normal in size with greater than 50% respiratory variability, suggesting right atrial pressure of 3 mmHg. IAS/Shunts: No atrial level shunt detected by color flow Doppler.  LEFT VENTRICLE PLAX 2D LVIDd:         6.10 cm   Diastology LVIDs:         4.70 cm   LV e' medial:    5.98 cm/s LV PW:         0.90 cm   LV E/e' medial:  10.4 LV IVS:        0.90 cm   LV e' lateral:   5.98 cm/s LVOT diam:     2.10 cm   LV E/e' lateral: 10.4 LV SV:         65 LV SV Index:   29 LVOT Area:     3.46 cm  RIGHT VENTRICLE RV S prime:     10.40 cm/s TAPSE (M-mode): 2.7 cm LEFT ATRIUM             Index         RIGHT ATRIUM  Index LA Vol (A2C):   60.5 ml 27.40 ml/m  RA Area:     13.70 cm LA Vol (A4C):   25.2 ml 11.41 ml/m  RA Volume:   30.80 ml  13.95 ml/m LA Biplane Vol: 40.7 ml 18.43 ml/m  AORTIC VALVE AV Area (Vmax):    2.94 cm AV Area (Vmean):   2.90 cm AV Area (VTI):     3.12 cm AV Vmax:           110.00 cm/s AV Vmean:          84.200 cm/s AV VTI:            0.209 m AV Peak Grad:      4.8 mmHg AV Mean Grad:      3.0 mmHg LVOT Vmax:         93.40 cm/s LVOT Vmean:        70.600 cm/s LVOT VTI:          0.188 m LVOT/AV VTI ratio: 0.90 MITRAL VALVE MV Area (PHT): 2.66 cm    SHUNTS MV Decel Time: 285 msec    Systemic VTI:  0.19 m MV E velocity: 62.40 cm/s  Systemic Diam: 2.10 cm MV A velocity: 72.80 cm/s MV E/A ratio:  0.86 Arvilla Meres MD Electronically signed by Arvilla Meres MD Signature Date/Time: 10/20/2023/2:31:34 PM    Final    Overnight EEG with video Result Date: 10/20/2023 Charlsie Quest, MD     10/21/2023  9:32 AM Patient Name: Jermaine [name] MRN: [id number] Epilepsy Attending: Charlsie Quest Referring Physician/Provider: Erick Blinks, MD Duration: 10/20/2023 4098 to 10/21/2023 1191 Patient history: 43 y.o. male with hx of anxieety, CAD, HTN, HLD, OSA, ?seizures, emolib strokes and afibb on AC but non compliance as he can't afford it. He presents with 2-3 months episodes of pressure in his head like he is on a roller coaster or going up the mountain, then he is dazed off. Lasts about 2-3 mins and then back to his baseline. They have been increasing in frequency and reports now having about 10 a day. EEG to evaluate for seizure Level of alertness: Awake, asleep AEDs during EEG study: None Technical aspects: This EEG study was done with scalp electrodes positioned according to the 10-20 International system of electrode placement. Electrical activity was reviewed with band pass filter of 1-70Hz , sensitivity of 7 uV/mm, display speed of 49mm/sec with a 60Hz  notched filter applied as  appropriate. EEG data were recorded continuously and digitally stored.  Video monitoring was available and reviewed as appropriate. Description: The posterior dominant rhythm consists of 8 Hz activity of moderate voltage (25-35 uV) seen predominantly in posterior head regions, symmetric and reactive to eye opening and eye closing. Sleep was characterized by vertex waves, sleep spindles (12 to 14 Hz), maximal frontocentral region. Hyperventilation and photic stimulation were not performed.   IMPRESSION: This study is within normal limits. No seizures or epileptiform discharges were seen throughout the recording. A normal interictal EEG does not exclude the diagnosis of epilepsy. Charlsie Quest   MR BRAIN WO CONTRAST Result Date: 10/19/2023 CLINICAL DATA:  Neuro deficit, acute, stroke suspected EXAM: MRI HEAD WITHOUT CONTRAST TECHNIQUE: Multiplanar, multiecho pulse sequences of the brain and surrounding structures were obtained without intravenous contrast. COMPARISON:  MRI August 03, 2023 FINDINGS: Brain: No acute infarction, hemorrhage, hydrocephalus, extra-axial collection or mass lesion. Small remote left cerebellar infarct. Vascular: Major arterial flow voids are maintained. Skull and upper cervical spine: Normal marrow signal.  Sinuses/Orbits: Mild paranasal sinus mucosal thickening. No acute orbital findings. Other: No mastoid effusions. IMPRESSION: No evidence of acute intracranial abnormality. Electronically Signed   By: Feliberto Harts M.D.   On: 10/19/2023 21:48   CT ANGIO HEAD NECK W WO CM Result Date: 10/19/2023 CLINICAL DATA:  Neuro deficit, acute, stroke suspected EXAM: CT ANGIOGRAPHY HEAD AND NECK WITH AND WITHOUT CONTRAST TECHNIQUE: Multidetector CT imaging of the head and neck was performed using the standard protocol during bolus administration of intravenous contrast. Multiplanar CT image reconstructions and MIPs were obtained to evaluate the vascular anatomy. Carotid stenosis  measurements (when applicable) are obtained utilizing NASCET criteria, using the distal internal carotid diameter as the denominator. RADIATION DOSE REDUCTION: This exam was performed according to the departmental dose-optimization program which includes automated exposure control, adjustment of the mA and/or kV according to patient size and/or use of iterative reconstruction technique. CONTRAST:  75mL OMNIPAQUE IOHEXOL 350 MG/ML SOLN COMPARISON:  None Available. FINDINGS: CT HEAD FINDINGS Brain: No evidence of acute infarction, hemorrhage, hydrocephalus, extra-axial collection or mass lesion/mass effect. Vascular: See below. Skull: No acute fracture. Sinuses/Orbits: Clear sinuses.  No acute orbital findings. Other: No mastoid effusions. Review of the MIP images confirms the above findings CTA NECK FINDINGS Aortic arch: Great vessel origins are patent without significant stenosis. Right carotid system: No evidence of dissection, stenosis (50% or greater), or occlusion. Left carotid system: No evidence of dissection, stenosis (50% or greater), or occlusion. Vertebral arteries: Right dominant. No evidence of dissection, stenosis (50% or greater), or occlusion. Skeleton: No acute abnormality on limited assessment. Other neck: No acute abnormality on limited assessment. Upper chest: Visualized lung apices are clear. Review of the MIP images confirms the above findings CTA HEAD FINDINGS Anterior circulation: Bilateral intracranial ICAs, MCAs, and ACAs are patent without proximal hemodynamically significant stenosis. Posterior circulation: Bilateral intradural vertebral arteries, basilar artery and bilateral posterior cerebral arteries are patent without proximal hemodynamically significant stenosis. Venous sinuses: As permitted by contrast timing, patent. Review of the MIP images confirms the above findings IMPRESSION: 1. No emergent large vessel occlusion or proximal hemodynamically significant stenosis. 2. No evidence  of acute intracranial abnormality. Electronically Signed   By: Feliberto Harts M.D.   On: 10/19/2023 19:26        Scheduled Meds:  apixaban  5 mg Oral BID   carvedilol  3.125 mg Oral BID WC   feeding supplement  237 mL Oral BID BM   losartan  25 mg Oral Daily   rosuvastatin  40 mg Oral Daily   Continuous Infusions:   LOS: 2 days    Time spent: 40 minutes    Ramiro Harvest, MD [facility]   To contact the attending provider between 7A-7P or the covering provider during after hours 7P-7A, please log into the web site [email or web address] and access using universal [place] password for that web site. If you do not have the password, please call the hospital operator.  10/21/2023, 4:27 PM

## 2023-10-21 NOTE — Progress Notes (Signed)
NEUROLOGY CONSULT FOLLOW UP NOTE   Date of service: October 21, 2023 Patient Name: Jermaine [name] MRN:  [id number] DOB:  Oct 29, 1980  Interval Hx/subjective   No typical spells. EEG WNL thus far.  Vitals   Vitals:   10/21/23 0811 10/21/23 1220 10/21/23 1552 10/21/23 1654  BP: 115/78 130/69 126/81 133/78  Pulse: 71 72 82 77  Resp: 17 17 17    Temp: 98.7 F (37.1 C) 97.9 F (36.6 C) 98.3 F (36.8 C)   TempSrc: Oral Oral Oral   SpO2: 98% 100% 99%   Height:         Body mass index is 26.65 kg/m.  Physical Exam   General: Laying comfortably in bed; in no acute distress.  HENT: Normal oropharynx and mucosa. Normal external appearance of ears and nose.  Neck: Supple, no pain or tenderness  CV: No JVD. No peripheral edema.  Pulmonary: Symmetric Chest rise. Normal respiratory effort.  Abdomen: Soft to touch, non-tender.  Ext: No cyanosis, edema, or deformity  Skin: No rash. Normal palpation of skin.   Musculoskeletal: Normal digits and nails by inspection. No clubbing.   Neurologic Examination   Mental status/Cognition: Alert, oriented to self, place, month and year, good attention.  Speech/language: Fluent, comprehension intact, object naming intact, repetition intact.  Cranial nerves:   CN II Pupils equal and reactive to light, no VF deficits    CN III,IV,VI EOM intact, no gaze preference or deviation, no nystagmus    CN V normal sensation in V1, V2, and V3 segments bilaterally    CN VII Slight R facial asymmetry.   CN VIII normal hearing to speech    CN IX & X normal palatal elevation, no uvular deviation    CN XI 5/5 head turn and 5/5 shoulder shrug bilaterally    CN XII midline tongue protrusion     Motor:  Muscle bulk: normal, tone normal, pronator drift none tremor none Mvmt Root Nerve  Muscle Right Left Comments  SA C5/6 Ax Deltoid 5 5    EF C5/6 Mc Biceps 5 5    EE C6/7/8 Rad Triceps 5 5    WF C6/7 Med FCR        WE C7/8 PIN ECU        F Ab C8/T1 U ADM/FDI  5 5 Noted to have give away weakness in RUE  HF L1/2/3 Fem Illopsoas 5 5 Noted to have giveaway weakness in R hip flexion.  KE L2/3/4 Fem Quad 5 5    DF L4/5 D Peron Tib Ant 5 5    PF S1/2 Tibial Grc/Sol 5 5      Sensation:  Light touch Decreased to light touch in R face, R arm and R leg.   Pin prick     Temperature     Vibration    Proprioception      Coordination/Complex Motor:  - Finger to Nose intact BL - Heel to shin intact BL - Rapid alternating movement are intact BL - Gait: Deferred.  Medications  Current Facility-Administered Medications:    acetaminophen (TYLENOL) tablet 650 mg, 650 mg, Oral, Q6H PRN, 650 mg at 10/21/23 0017 **OR** acetaminophen (TYLENOL) suppository 650 mg, 650 mg, Rectal, Q6H PRN, Sherryll Burger, Pratik D, DO   apixaban (ELIQUIS) tablet 5 mg, 5 mg, Oral, BID, Sherryll Burger, Pratik D, DO, 5 mg at 10/21/23 6213   carvedilol (COREG) tablet 3.125 mg, 3.125 mg, Oral, BID WC, Shah, Pratik D, DO, 3.125 mg at 10/21/23 1654  feeding supplement (ENSURE ENLIVE / ENSURE PLUS) liquid 237 mL, 237 mL, Oral, BID BM, Adefeso, Oladapo, DO, 237 mL at 10/21/23 1334   losartan (COZAAR) tablet 25 mg, 25 mg, Oral, Daily, Sherryll Burger, Pratik D, DO, 25 mg at 10/21/23 0921   ondansetron (ZOFRAN) tablet 4 mg, 4 mg, Oral, Q6H PRN **OR** ondansetron (ZOFRAN) injection 4 mg, 4 mg, Intravenous, Q6H PRN, Adefeso, Oladapo, DO   Oral care mouth rinse, 15 mL, Mouth Rinse, PRN, Rodolph Bong, MD   rosuvastatin (CRESTOR) tablet 40 mg, 40 mg, Oral, Daily, Sherryll Burger, Pratik D, DO, 40 mg at 10/21/23 0981  Labs and Diagnostic Imaging   CBC:  Recent Labs  Lab 10/19/23 1731 10/20/23 0537 10/21/23 0601  WBC 4.0 3.2* 4.9  NEUTROABS 2.9  --   --   HGB 14.0 12.8* 13.7  HCT 39.1 36.0* 38.5*  MCV 94.2 91.8 92.3  PLT 133* 131* 141*    Basic Metabolic Panel:  Lab Results  Component Value Date   NA 138 10/21/2023   K 3.1 (L) 10/21/2023   CO2 25 10/21/2023   GLUCOSE 89 10/21/2023   BUN 9 10/21/2023    CREATININE 1.12 10/21/2023   CALCIUM 8.1 (L) 10/21/2023   GFRNONAA >60 10/21/2023   GFRAA >60 06/03/2020   Lipid Panel:  Lab Results  Component Value Date   LDLCALC 39 10/20/2023   HgbA1c:  Lab Results  Component Value Date   HGBA1C 5.0 10/20/2023   Urine Drug Screen:     Component Value Date/Time   LABOPIA NONE DETECTED 10/20/2023 1140   COCAINSCRNUR NONE DETECTED 10/20/2023 1140   LABBENZ NONE DETECTED 10/20/2023 1140   AMPHETMU POSITIVE (A) 10/20/2023 1140   THCU POSITIVE (A) 10/20/2023 1140   LABBARB NONE DETECTED 10/20/2023 1140    Alcohol Level     Component Value Date/Time   ETH <10 10/19/2023 1731   INR  Lab Results  Component Value Date   INR 1.2 10/19/2023   APTT  Lab Results  Component Value Date   APTT 27 10/19/2023   AED levels:  Lab Results  Component Value Date   LAMOTRIGINE 1.3 (L) 12/11/2020   LEVETIRACETA 29.0 12/23/2017    CT Head without contrast(Personally reviewed): CTH was negative for a large hypodensity concerning for a large territory infarct or hyperdensity concerning for an ICH   CT angio Head and Neck with contrast(Personally reviewed): No LVO.   MRI Brain(Personally reviewed): No acute abnormality  cEEG: WNL thus far  Assessment   Jermaine [name] is a 43 y.o. male with hx of anxieety, CAD, HTN, HLD, OSA, ?seizures, emolib strokes and afibb on AC but non compliance as he can't afford it. He presents with 2-3 months episodes of pressure in his head like he is on a roller coaster or going up the mountain, then he is dazed off. Lasts about 2-3 mins and then back to his baseline. They have been increasing in frequency and reports now having about 10 a day.   Exam with non organic appearing R facial asymmetry that is distractable, give away weakness noted in RUE and RLE with R sided numbness. Despite his symptoms, MRI Brain is negative for acute abnormalities.  He is on continuous EEG and has not had any typical spells since hookup.   EEG has been normal thus far.  UDS positive for THC and amphetamines. Denies substance use.  Recommendations   - Continue cEEG for spell capture ______________________________________________________________________   Signed, Jefferson Fuel, MD [facility]

## 2023-10-21 NOTE — Procedures (Addendum)
Patient Name: Jermaine [name]  MRN: [id number]  Epilepsy Attending: Charlsie Quest  Referring Physician/Provider: Erick Blinks, MD  Duration: 10/21/2023 1308 to 10/22/2023 0314   Patient history: 43 y.o. male with hx of anxieety, CAD, HTN, HLD, OSA, ?seizures, emolib strokes and afibb on AC but non compliance as he can't afford it. He presents with 2-3 months episodes of pressure in his head like he is on a roller coaster or going up the mountain, then he is dazed off. Lasts about 2-3 mins and then back to his baseline. They have been increasing in frequency and reports now having about 10 a day. EEG to evaluate for seizure   Level of alertness: Awake, asleep   AEDs during EEG study: None   Technical aspects: This EEG study was done with scalp electrodes positioned according to the 10-20 International system of electrode placement. Electrical activity was reviewed with band pass filter of 1-70Hz , sensitivity of 7 uV/mm, display speed of 28mm/sec with a 60Hz  notched filter applied as appropriate. EEG data were recorded continuously and digitally stored.  Video monitoring was available and reviewed as appropriate.   Description: The posterior dominant rhythm consists of 8 Hz activity of moderate voltage (25-35 uV) seen predominantly in posterior head regions, symmetric and reactive to eye opening and eye closing. Sleep was characterized by vertex waves, sleep spindles (12 to 14 Hz), maximal frontocentral region. Hyperventilation and photic stimulation were not performed.      IMPRESSION: This study is within normal limits. No seizures or epileptiform discharges were seen throughout the recording.   A normal interictal EEG does not exclude the diagnosis of epilepsy.   Sahvanna Mcmanigal Annabelle Harman

## 2023-10-22 DIAGNOSIS — R29818 Other symptoms and signs involving the nervous system: Secondary | ICD-10-CM | POA: Diagnosis not present

## 2023-10-22 DIAGNOSIS — I10 Essential (primary) hypertension: Secondary | ICD-10-CM | POA: Diagnosis not present

## 2023-10-22 DIAGNOSIS — I480 Paroxysmal atrial fibrillation: Secondary | ICD-10-CM | POA: Diagnosis not present

## 2023-10-22 DIAGNOSIS — R931 Abnormal findings on diagnostic imaging of heart and coronary circulation: Secondary | ICD-10-CM | POA: Diagnosis not present

## 2023-10-22 LAB — CBC
HCT: 41.2 % (ref 39.0–52.0)
Hemoglobin: 15 g/dL (ref 13.0–17.0)
MCH: 33.3 pg (ref 26.0–34.0)
MCHC: 36.4 g/dL — ABNORMAL HIGH (ref 30.0–36.0)
MCV: 91.6 fL (ref 80.0–100.0)
Platelets: 172 10*3/uL (ref 150–400)
RBC: 4.5 MIL/uL (ref 4.22–5.81)
RDW: 12 % (ref 11.5–15.5)
WBC: 8.1 10*3/uL (ref 4.0–10.5)
nRBC: 0 % (ref 0.0–0.2)

## 2023-10-22 LAB — BASIC METABOLIC PANEL
Anion gap: 11 (ref 5–15)
BUN: 13 mg/dL (ref 6–20)
CO2: 24 mmol/L (ref 22–32)
Calcium: 8.7 mg/dL — ABNORMAL LOW (ref 8.9–10.3)
Chloride: 102 mmol/L (ref 98–111)
Creatinine, Ser: 0.85 mg/dL (ref 0.61–1.24)
GFR, Estimated: >60 mL/min (ref >60)
Glucose, Bld: 85 mg/dL (ref 70–99)
Potassium: 3.5 mmol/L (ref 3.5–5.1)
Sodium: 137 mmol/L (ref 135–145)

## 2023-10-22 LAB — MAGNESIUM: Magnesium: 2 mg/dL (ref 1.7–2.4)

## 2023-10-22 MED ORDER — LORAZEPAM 2 MG/ML IJ SOLN
1.0000 mg | Freq: Four times a day (QID) | INTRAMUSCULAR | Status: DC | PRN
  Administered 2023-10-22 – 2023-10-23 (×2): 1 mg via INTRAVENOUS
  Filled 2023-10-22 (×2): qty 1

## 2023-10-22 MED ORDER — SORBITOL 70 % SOLN
30.0000 mL | Status: AC
Start: 2023-10-22 — End: 2023-10-22
  Administered 2023-10-22: 30 mL via ORAL
  Filled 2023-10-22 (×2): qty 30

## 2023-10-22 MED ORDER — DIPHENHYDRAMINE HCL 25 MG PO CAPS
25.0000 mg | ORAL_CAPSULE | Freq: Once | ORAL | Status: AC
  Administered 2023-10-22: 25 mg via ORAL
  Filled 2023-10-22: qty 1

## 2023-10-22 MED ORDER — POLYETHYLENE GLYCOL 3350 17 G PO PACK
17.0000 g | PACK | Freq: Every day | ORAL | Status: DC
  Administered 2023-10-22: 17 g via ORAL
  Filled 2023-10-22: qty 1

## 2023-10-22 MED ORDER — POTASSIUM CHLORIDE CRYS ER 10 MEQ PO TBCR
40.0000 meq | EXTENDED_RELEASE_TABLET | Freq: Once | ORAL | Status: AC
  Administered 2023-10-22: 40 meq via ORAL
  Filled 2023-10-22: qty 4

## 2023-10-22 NOTE — Discharge Instructions (Signed)

## 2023-10-22 NOTE — TOC Progression Note (Signed)
Transition of Care [facility]) - Progression Note    Patient Details  Name: Jermaine [name] MRN: [id number] Date of Birth: 08/04/1981  [facility]) CM/SW Contact  Kermit Balo, RN Phone Number: 10/22/2023, 3:50 PM  Clinical Narrative:     PT says he needs some support at d/c. He has been staying in a motel with a friend that is now not reachable. Pt is working on finding someone he can stay with after d/c for a short period of time.  Walker for home delivered to the room per [employer].  Pt will need transportation assistance at d/c.  Once d/c destination determined pt will need outpatient therapy arranged for PT.  TOC following.  Expected Discharge Plan: Skilled Nursing Facility Barriers to Discharge: Continued Medical Work up  Expected Discharge Plan and Services   Discharge Planning Services: CM Consult Post Acute Care Choice: IP Rehab Living arrangements for the past 2 months: Hotel/Motel                                       Social Determinants of Health (SDOH) Interventions SDOH Screenings   Food Insecurity: No Food Insecurity (10/20/2023)  Housing: Unknown (10/20/2023)  Recent Concern: Housing - High Risk (08/05/2023)  Transportation Needs: No Transportation Needs (10/20/2023)  Utilities: Not At Risk (10/20/2023)  Alcohol Screen: Low Risk  (02/26/2019)  Depression (PHQ2-9): Medium Risk (12/11/2020)  Financial Resource Strain: High Risk (10/19/2023)  Physical Activity: Insufficiently Active (08/25/2018)  Social Connections: Somewhat Isolated (08/25/2018)  Stress: Stress Concern Present (08/25/2018)  Tobacco Use: High Risk (10/19/2023)    Readmission Risk Interventions     No data to display

## 2023-10-22 NOTE — Progress Notes (Signed)
PROGRESS NOTE    Jermaine [name]  NWG:[id number] DOB: 04-29-1981 DOA: 10/19/2023 PCP: Benita Stabile, MD    Chief Complaint  Patient presents with   Seizures   Facial Droop    Rt facial droop, rt arm weakness    Brief Narrative:  Jermaine [name] is a 43 y.o. male with medical history significant of CAD, type 2 diabetes, hyperlipidemia seizure disorder, chronic pain/anxiety disorder/bipolar disorder/depression who presents to the emergency department due to 2-day onset of seizure-like activities and increased in ear pressure.  He was admitted for concerns of acute focal neurologic deficit and possible seizure activity.  He he underwent continuous EEG video monitoring overnight with no acute findings noted.  Patient noted to be noncompliant with his home medications    Assessment & Plan:   Principal Problem:   Acute focal neurological deficit Active Problems:   Essential hypertension   A-fib (HCC)   Mixed hyperlipidemia   Hypokalemia   Hypoalbuminemia due to protein-calorie malnutrition (HCC)   History of CVA (cerebrovascular accident)   Abnormal echocardiogram  #1 acute focal neurological deficits, rule out acute ischemic stroke -Patient noted to have presented with some facial symmetry, concern for seizure-like activities. -CT angiogram head and neck done with no LVO or proximal hemodynamically significant stenosis. -MRI head done negative for any acute intracranial abnormality. -2D echo with EF of 40 to 45%, left ventricular global hypokinesis, grade 1 DD, no atrial level shunt detected by color-flow Doppler. -Patient with no signs or symptoms of infection. -UDS done positive for amphetamines and THC however patient adamantly denies any recent use of amphetamines. -Patient on LTM EEG, with EEG findings so far with no seizures or epileptiform discharges seen throughout the recording. -Continue aspirin, statin. -Patient being followed by neurology and no further inpatient workup  recommended at this time per neurology and will need outpatient follow-up with neurology. -PT/OT. -TOC following to help with disposition.  2.  Hypokalemia -Potassium currently at 3.5 today.   -Magnesium at 2.0.   -K-Dur 40 mEq p.o. x 1.  -Repeat labs in AM.  3.  Thrombocytopenia -Felt likely reactive. -Patient with no overt bleeding. -Platelet count slowly improving daily.  4.  History of CVA -Continue Crestor, Eliquis for secondary stroke prophylaxis. -Per Dr.Shah, patient noted to be noncompliant with his medications.  5.  A-fib with RVR -Continue Coreg for rate control.   -Eliquis for anticoagulation.   -Patient with history of noncompliance.   6.  Hypertension -Continue losartan, Coreg.   7.  Hyperlipidemia -Crestor.   8.  Abnormal 2D echo --2D echo with EF of 40 to 45%, left ventricular global hypokinesis, grade 1 DD, no atrial level shunt detected by color-flow Doppler. -2D echo unchanged from prior 2D echo. -Patient noted to have a cardiac catheterization done 08/05/2023 with no angiographic evidence of CAD, normal LV filling pressures and recommendations at that time was no further ischemic workup. -Outpatient follow-up with PCP.    DVT prophylaxis: Eliquis Code Status: Full Family Communication: Updated patient.  No family at bedside. Disposition: TBD  Status is: Inpatient Remains inpatient appropriate because: Severity of illness.    Consultants:  Neurology: Dr.Khaliqdina 10/20/2023  Procedures:  LTM EEG CT angiogram head 10/19/2023 MRI brain 10/19/2023 2D echo 10/20/2023   Antimicrobials: Anti-infectives (From admission, onward)    None         Subjective: Laying in bed.  Denies any chest pain or shortness of breath.  No abdominal pain.  Still denies any amphetamine use  recently.    Objective: Vitals:   10/22/23 0421 10/22/23 0739 10/22/23 1107 10/22/23 1511  BP: 126/69 114/67 116/71 102/66  Pulse: 73 75 81 77  Resp: 18 17 18 18   Temp:  98.4 F (36.9 C) 98.1 F (36.7 C) 98.5 F (36.9 C) 98.2 F (36.8 C)  TempSrc: Axillary Oral Oral Oral  SpO2: 97% 98% 100% 98%  Height:        Intake/Output Summary (Last 24 hours) at 10/22/2023 1533 Last data filed at 10/22/2023 0600 Gross per 24 hour  Intake --  Output 1400 ml  Net -1400 ml   There were no vitals filed for this visit.  Examination:  General exam: NAD. Respiratory system: CTAB.  No wheezes, no crackles, no rhonchi.  Fair air movement.  Speaking in full sentences.   Cardiovascular system: RRR no murmurs rubs or gallops.  No JVD.  No lower extremity edema.  Gastrointestinal system: Abdomen soft, nontender, nondistended, positive bowel sounds.  No rebound.  No guarding.   Central nervous system: Alert and oriented.  Moving extremities spontaneously.  No focal neurological deficits. Extremities: Symmetric 5 x 5 power. Skin: No rashes, lesions or ulcers Psychiatry: Judgement and insight appear normal. Mood & affect appropriate.     Data Reviewed: I have personally reviewed following labs and imaging studies  CBC: Recent Labs  Lab 10/19/23 1731 10/20/23 0537 10/21/23 0601 10/22/23 0633  WBC 4.0 3.2* 4.9 8.1  NEUTROABS 2.9  --   --   --   HGB 14.0 12.8* 13.7 15.0  HCT 39.1 36.0* 38.5* 41.2  MCV 94.2 91.8 92.3 91.6  PLT 133* 131* 141* 172    Basic Metabolic Panel: Recent Labs  Lab 10/19/23 1731 10/20/23 0537 10/21/23 0601 10/22/23 0633  NA 138 138 138 137  K 3.2* 3.2* 3.1* 3.5  CL 105 105 103 102  CO2 23 23 25 24   GLUCOSE 89 97 89 85  BUN 11 10 9 13   CREATININE 1.12 1.12 1.12 0.85  CALCIUM 8.0* 7.9* 8.1* 8.7*  MG  --  1.9 1.8 2.0  PHOS  --  3.1  --   --     GFR: CrCl cannot be calculated (Unknown ideal weight.).  Liver Function Tests: Recent Labs  Lab 10/19/23 1731 10/20/23 0537  AST 17 16  ALT 15 13  ALKPHOS 46 46  BILITOT 0.7 0.5  PROT 5.9* 5.5*  ALBUMIN 3.4* 3.0*    CBG: No results for input(s): "GLUCAP" in the last 168  hours.   No results found for this or any previous visit (from the past 240 hours).       Radiology Studies: No results found.       Scheduled Meds:  apixaban  5 mg Oral BID   carvedilol  3.125 mg Oral BID WC   feeding supplement  237 mL Oral BID BM   losartan  25 mg Oral Daily   rosuvastatin  40 mg Oral Daily   Continuous Infusions:   LOS: 3 days    Time spent: 40 minutes    Ramiro Harvest, MD [facility]   To contact the attending provider between 7A-7P or the covering provider during after hours 7P-7A, please log into the web site [email or web address] and access using universal [place] password for that web site. If you do not have the password, please call the hospital operator.  10/22/2023, 3:33 PM

## 2023-10-22 NOTE — Progress Notes (Signed)
Physical Therapy Treatment Patient Details Name: Jermaine [name] MRN: [id number] DOB: 06/22/1981 Today's Date: 10/22/2023   History of Present Illness 43 y.o. male presents to ED 1/28 after 2-day onset of seizure-like activities that lead to R-sided facial droop, numbness and weakness and increased in ear pressure. Seen at [facility] last week and discharged however symptoms persisted. MRI and CT of head show no evidence of acute abnormality. Continuous  EEG performed with no episodes captured while running PMH CAD, type 2 diabetes, hyperlipidemia seizure disorder, chronic pain/anxiety disorder/bipolar disorder/depression    PT Comments  Pt is awake, supine in bed in darkened room on entry. Reluctantly allowed PT to open blind enough to see. Pt initially refuses getting up, but then is agreeable to get up to use the bathroom. Pt is supervision for bed mobility and contact guard for transfers and ambulation in room. Pt refuses further mobilization secondary to back pain, which he reports is his most limiting factor. Pt would benefit from OP Neuro PT and supervision for mobilization in his home environment. PT will continue to follow acutely.    If plan is discharge home, recommend the following: A little help with bathing/dressing/bathroom;A lot of help with walking and/or transfers;Assistance with cooking/housework;Direct supervision/assist for medications management;Direct supervision/assist for financial management;Assist for transportation;Help with stairs or ramp for entrance   Can travel by private vehicle      Yes  Equipment Recommendations  Rolling walker (2 wheels)       Precautions / Restrictions Precautions Precautions: Fall Precaution Comments: many falls prior to hospitalization Restrictions Weight Bearing Restrictions Per Provider Order: No     Mobility  Bed Mobility Overal bed mobility: Needs Assistance Bed Mobility: Supine to Sit, Sit to Supine     Supine to sit:  Supervision Sit to supine: Supervision   General bed mobility comments: increased time and effort, to come to EoB    Transfers Overall transfer level: Needs assistance Equipment used: Rolling walker (2 wheels) Transfers: Sit to/from Stand, Bed to chair/wheelchair/BSC Sit to Stand: Contact guard assist           General transfer comment: contact guard for safety, good power up and self steadying in RW.    Ambulation/Gait Ambulation/Gait assistance: Contact guard assist Gait Distance (Feet): 24 Feet Assistive device: Rolling walker (2 wheels) Gait Pattern/deviations: Step-through pattern, Trunk flexed Gait velocity: slowed Gait velocity interpretation: <1.8 ft/sec, indicate of risk for recurrent falls   General Gait Details: contact guard for safety to ambulate to bathroom, stop at sink to wash hands and face and return to bed, pt refuses further ambulation due to c/o back pain         Balance Overall balance assessment: Needs assistance Sitting-balance support: Feet supported, No upper extremity supported, Single extremity supported Sitting balance-Leahy Scale: Fair     Standing balance support: Bilateral upper extremity supported, During functional activity, Reliant on assistive device for balance Standing balance-Leahy Scale: Poor Standing balance comment: benefits from outside assist for dynamic balance                            Cognition Arousal: Alert Behavior During Therapy: Flat affect Overall Cognitive Status: Within Functional Limits for tasks assessed  General Comments General comments (skin integrity, edema, etc.): pt reports his vision is better      Pertinent Vitals/Pain Pain Assessment Pain Assessment: 0-10 Faces Pain Scale: Hurts a little bit Pain Location: generalized, Pain Descriptors / Indicators: Constant, Aching, Sore Pain Intervention(s): Limited activity within  patient's tolerance, Monitored during session, Repositioned, Patient requesting pain meds-RN notified     PT Goals (current goals can now be found in the care plan section) Acute Rehab PT Goals PT Goal Formulation: With patient/family Time For Goal Achievement: 11/03/23 Potential to Achieve Goals: Fair Progress towards PT goals: Progressing toward goals    Frequency    Min 1X/week          AM-PAC PT "6 Clicks" Mobility   Outcome Measure  Help needed turning from your back to your side while in a flat bed without using bedrails?: None Help needed moving from lying on your back to sitting on the side of a flat bed without using bedrails?: A Little Help needed moving to and from a bed to a chair (including a wheelchair)?: A Little Help needed standing up from a chair using your arms (e.g., wheelchair or bedside chair)?: A Little Help needed to walk in hospital room?: A Lot Help needed climbing 3-5 steps with a railing? : Total 6 Click Score: 16    End of Session Equipment Utilized During Treatment: Gait belt Activity Tolerance: Patient tolerated treatment well Patient left: in bed;with call bell/phone within reach;with bed alarm set Nurse Communication: Mobility status;Patient requests pain meds PT Visit Diagnosis: Unsteadiness on feet (R26.81);Repeated falls (R29.6);Muscle weakness (generalized) (M62.81);History of falling (Z91.81);Difficulty in walking, not elsewhere classified (R26.2);Other symptoms and signs involving the nervous system (R29.898)     Time: 1610-9604 PT Time Calculation (min) (ACUTE ONLY): 11 min  Charges:    $Gait Training: 8-22 mins PT General Charges $$ ACUTE PT VISIT: 1 Visit                     Konstance Happel B. Beverely Risen PT, DPT Acute Rehabilitation Services Please use secure chat or  Call Office [phone]    Elon Alas Fleet 10/22/2023, 11:19 AM

## 2023-10-22 NOTE — Progress Notes (Signed)
 LTM EEG disconnected - no skin breakdown at [facility].

## 2023-10-22 NOTE — Plan of Care (Signed)
  Problem: Education: Goal: Knowledge of General Education information will improve Description: Including pain rating scale, medication(s)/side effects and non-pharmacologic comfort measures Outcome: Progressing   Problem: Clinical Measurements: Goal: Respiratory complications will improve Outcome: Progressing Goal: Cardiovascular complication will be avoided Outcome: Progressing   Problem: Activity: Goal: Risk for activity intolerance will decrease Outcome: Progressing   Problem: Nutrition: Goal: Adequate nutrition will be maintained Outcome: Progressing   Problem: Coping: Goal: Level of anxiety will decrease Outcome: Progressing   Problem: Elimination: Goal: Will not experience complications related to urinary retention Outcome: Progressing   Problem: Education: Goal: Knowledge of disease or condition will improve Outcome: Progressing   Problem: Ischemic Stroke/TIA Tissue Perfusion: Goal: Complications of ischemic stroke/TIA will be minimized Outcome: Progressing   Problem: Coping: Goal: Will identify appropriate support needs Outcome: Progressing   Problem: Nutrition: Goal: Risk of aspiration will decrease Outcome: Progressing Goal: Dietary intake will improve Outcome: Progressing

## 2023-10-23 ENCOUNTER — Other Ambulatory Visit (HOSPITAL_COMMUNITY): Payer: Self-pay

## 2023-10-23 DIAGNOSIS — R29818 Other symptoms and signs involving the nervous system: Secondary | ICD-10-CM | POA: Diagnosis not present

## 2023-10-23 DIAGNOSIS — I480 Paroxysmal atrial fibrillation: Secondary | ICD-10-CM | POA: Diagnosis not present

## 2023-10-23 DIAGNOSIS — Z8673 Personal history of transient ischemic attack (TIA), and cerebral infarction without residual deficits: Secondary | ICD-10-CM | POA: Diagnosis not present

## 2023-10-23 DIAGNOSIS — R931 Abnormal findings on diagnostic imaging of heart and coronary circulation: Secondary | ICD-10-CM | POA: Diagnosis not present

## 2023-10-23 LAB — CBC
HCT: 41.3 % (ref 39.0–52.0)
Hemoglobin: 15.2 g/dL (ref 13.0–17.0)
MCH: 33.6 pg (ref 26.0–34.0)
MCHC: 36.8 g/dL — ABNORMAL HIGH (ref 30.0–36.0)
MCV: 91.4 fL (ref 80.0–100.0)
Platelets: 185 10*3/uL (ref 150–400)
RBC: 4.52 MIL/uL (ref 4.22–5.81)
RDW: 11.9 % (ref 11.5–15.5)
WBC: 6 10*3/uL (ref 4.0–10.5)
nRBC: 0 % (ref 0.0–0.2)

## 2023-10-23 LAB — BASIC METABOLIC PANEL
Anion gap: 11 (ref 5–15)
BUN: 17 mg/dL (ref 6–20)
CO2: 22 mmol/L (ref 22–32)
Calcium: 9.1 mg/dL (ref 8.9–10.3)
Chloride: 104 mmol/L (ref 98–111)
Creatinine, Ser: 1.05 mg/dL (ref 0.61–1.24)
GFR, Estimated: >60 mL/min (ref >60)
Glucose, Bld: 89 mg/dL (ref 70–99)
Potassium: 3.7 mmol/L (ref 3.5–5.1)
Sodium: 137 mmol/L (ref 135–145)

## 2023-10-23 MED ORDER — ONDANSETRON HCL 4 MG PO TABS
4.0000 mg | ORAL_TABLET | Freq: Four times a day (QID) | ORAL | 0 refills | Status: AC | PRN
  Filled 2023-10-23: qty 20, 5d supply, fill #0

## 2023-10-23 MED ORDER — SENNOSIDES-DOCUSATE SODIUM 8.6-50 MG PO TABS
1.0000 | ORAL_TABLET | Freq: Two times a day (BID) | ORAL | 0 refills | Status: AC
  Filled 2023-10-23: qty 60, 30d supply, fill #0

## 2023-10-23 MED ORDER — LOSARTAN POTASSIUM 25 MG PO TABS
25.0000 mg | ORAL_TABLET | Freq: Every day | ORAL | 1 refills | Status: AC
  Filled 2023-10-23 – 2023-12-17 (×3): qty 30, 30d supply, fill #0

## 2023-10-23 MED ORDER — ROSUVASTATIN CALCIUM 40 MG PO TABS
40.0000 mg | ORAL_TABLET | Freq: Every day | ORAL | 1 refills | Status: AC
  Filled 2023-10-23 – 2023-12-17 (×2): qty 30, 30d supply, fill #0

## 2023-10-23 MED ORDER — APIXABAN 5 MG PO TABS
5.0000 mg | ORAL_TABLET | Freq: Two times a day (BID) | ORAL | 1 refills | Status: AC
  Filled 2023-10-23 – 2023-12-17 (×2): qty 60, 30d supply, fill #0

## 2023-10-23 MED ORDER — CARVEDILOL 3.125 MG PO TABS
3.1250 mg | ORAL_TABLET | Freq: Two times a day (BID) | ORAL | 1 refills | Status: AC
  Filled 2023-10-23 – 2023-12-17 (×3): qty 60, 30d supply, fill #0

## 2023-10-23 MED ORDER — POLYETHYLENE GLYCOL 3350 17 G PO PACK
17.0000 g | PACK | Freq: Two times a day (BID) | ORAL | Status: DC
  Administered 2023-10-23: 17 g via ORAL
  Filled 2023-10-23: qty 1

## 2023-10-23 MED ORDER — POLYETHYLENE GLYCOL 3350 17 GM/SCOOP PO POWD
17.0000 g | Freq: Two times a day (BID) | ORAL | 0 refills | Status: AC
  Filled 2023-10-23: qty 476, 14d supply, fill #0

## 2023-10-23 MED ORDER — ACETAMINOPHEN 325 MG PO TABS: 650.0000 mg | ORAL_TABLET | Freq: Four times a day (QID) | ORAL | Status: AC | PRN

## 2023-10-23 MED ORDER — MAGNESIUM CITRATE PO SOLN: 1.0000 | Freq: Every day | ORAL | Status: DC | PRN

## 2023-10-23 MED ORDER — SENNOSIDES-DOCUSATE SODIUM 8.6-50 MG PO TABS
1.0000 | ORAL_TABLET | Freq: Two times a day (BID) | ORAL | Status: DC
  Administered 2023-10-23: 1 via ORAL
  Filled 2023-10-23: qty 1

## 2023-10-23 NOTE — Progress Notes (Signed)
Occupational Therapy Treatment Patient Details Name: Jermaine [name] MRN: [id number] DOB: 09-28-1980 Today's Date: 10/23/2023   History of present illness 43 y.o. male presents to ED 1/28 after 2-day onset of seizure-like activities that lead to R-sided facial droop, numbness and weakness and increased in ear pressure. Seen at [facility] last week and discharged however symptoms persisted. MRI and CT of head show no evidence of acute abnormality. Continuous  EEG performed with no episodes captured while running PMH CAD, type 2 diabetes, hyperlipidemia seizure disorder, chronic pain/anxiety disorder/bipolar disorder/depression   OT comments  Met pt in hallway ambulating independently without any AD. Pt at baseline iwht ADL tasks. Talked with girlfriend on the phone who is able to assist as needed after DC (recommend S with any medication management). Hnaded phone to pt's nurse to continue conversation. No further OT needs/OT signing off. Pt appropriate to DC home when medically cleared.       If plan is discharge home, recommend the following:  Direct supervision/assist for medications management   Equipment Recommendations  None recommended by OT    Recommendations for Other Services      Precautions / Restrictions Precautions Precautions: None       Mobility Bed Mobility Overal bed mobility: Independent                  Transfers Overall transfer level: Independent                       Balance Overall balance assessment: No apparent balance deficits (not formally assessed)                                         ADL either performed or assessed with clinical judgement   ADL Overall ADL's : At baseline                                            Extremity/Trunk Assessment Upper Extremity Assessment Upper Extremity Assessment: Overall WFL for tasks assessed   Lower Extremity Assessment Lower Extremity Assessment:  Defer to PT evaluation        Vision       Perception     Praxis      Cognition Arousal: Alert Behavior During Therapy: [facility] for tasks assessed/performed Overall Cognitive Status: Within Functional Limits for tasks assessed                                          Exercises      Shoulder Instructions       General Comments Walking in the hallway independently    Pertinent Vitals/ Pain       Pain Assessment Pain Assessment: No/denies pain  Home Living                                          Prior Functioning/Environment              Frequency  Min 1X/week        Progress Toward Goals  OT Goals(current goals can now be  found in the care plan section)  Progress towards OT goals: Goals met/education completed, patient discharged from OT  Acute Rehab OT Goals Patient Stated Goal: home today OT Goal Formulation: With patient ADL Goals Pt Will Perform Lower Body Bathing: with modified independence;sit to/from stand Pt Will Perform Lower Body Dressing: with modified independence;sit to/from stand Pt Will Transfer to Toilet: with modified independence;ambulating Additional ADL Goal #1: Pt will independently verbalize 3 compensatory strategies for memory to increase safety with IADL tasks  Plan      Co-evaluation                 AM-PAC OT "6 Clicks" Daily Activity     Outcome Measure   Help from another person eating meals?: None Help from another person taking care of personal grooming?: None Help from another person toileting, which includes using toliet, bedpan, or urinal?: None Help from another person bathing (including washing, rinsing, drying)?: None Help from another person to put on and taking off regular upper body clothing?: None Help from another person to put on and taking off regular lower body clothing?: None 6 Click Score: 24    End of Session    OT Visit Diagnosis: Unsteadiness on feet  (R26.81);Muscle weakness (generalized) (M62.81);Pain   Activity Tolerance Patient tolerated treatment well   Patient Left Other (comment) (standing in hallway wiht nsg)   Nurse Communication Other (comment) (speak wiht his girlfriend)        Time: 1249 (4098)-1191 OT Time Calculation (min): 10 min  Charges: OT General Charges $OT Visit: 1 Visit OT Treatments $Self Care/Home Management : 8-22 mins  Luisa Dago, OT/L   Acute OT Clinical Specialist Acute Rehabilitation Services Pager [phone] Office [phone]   [facility] 10/23/2023, 2:11 PM

## 2023-10-23 NOTE — Plan of Care (Signed)
  Problem: Education: Goal: Knowledge of General Education information will improve Description: Including pain rating scale, medication(s)/side effects and non-pharmacologic comfort measures Outcome: Adequate for Discharge   Problem: Health Behavior/Discharge Planning: Goal: Ability to manage health-related needs will improve Outcome: Adequate for Discharge   Problem: Clinical Measurements: Goal: Ability to maintain clinical measurements within normal limits will improve Outcome: Adequate for Discharge Goal: Will remain free from infection Outcome: Adequate for Discharge Goal: Diagnostic test results will improve Outcome: Adequate for Discharge Goal: Respiratory complications will improve Outcome: Adequate for Discharge Goal: Cardiovascular complication will be avoided Outcome: Adequate for Discharge   Problem: Activity: Goal: Risk for activity intolerance will decrease Outcome: Adequate for Discharge   Problem: Nutrition: Goal: Adequate nutrition will be maintained Outcome: Adequate for Discharge   Problem: Coping: Goal: Level of anxiety will decrease Outcome: Adequate for Discharge   Problem: Elimination: Goal: Will not experience complications related to bowel motility Outcome: Adequate for Discharge Goal: Will not experience complications related to urinary retention Outcome: Adequate for Discharge   Problem: Pain Managment: Goal: General experience of comfort will improve and/or be controlled Outcome: Adequate for Discharge   Problem: Safety: Goal: Ability to remain free from injury will improve Outcome: Adequate for Discharge   Problem: Skin Integrity: Goal: Risk for impaired skin integrity will decrease Outcome: Adequate for Discharge   Problem: Education: Goal: Knowledge of disease or condition will improve Outcome: Adequate for Discharge Goal: Knowledge of secondary prevention will improve (MUST DOCUMENT ALL) Outcome: Adequate for Discharge Goal:  Knowledge of patient specific risk factors will improve (DELETE if not current risk factor) Outcome: Adequate for Discharge   Problem: Ischemic Stroke/TIA Tissue Perfusion: Goal: Complications of ischemic stroke/TIA will be minimized Outcome: Adequate for Discharge   Problem: Coping: Goal: Will verbalize positive feelings about self Outcome: Adequate for Discharge Goal: Will identify appropriate support needs Outcome: Adequate for Discharge   Problem: Health Behavior/Discharge Planning: Goal: Ability to manage health-related needs will improve Outcome: Adequate for Discharge Goal: Goals will be collaboratively established with patient/family Outcome: Adequate for Discharge   Problem: Self-Care: Goal: Ability to participate in self-care as condition permits will improve Outcome: Adequate for Discharge Goal: Verbalization of feelings and concerns over difficulty with self-care will improve Outcome: Adequate for Discharge Goal: Ability to communicate needs accurately will improve Outcome: Adequate for Discharge   Problem: Nutrition: Goal: Risk of aspiration will decrease Outcome: Adequate for Discharge Goal: Dietary intake will improve Outcome: Adequate for Discharge

## 2023-10-23 NOTE — Plan of Care (Signed)

## 2023-10-23 NOTE — TOC Progression Note (Signed)
Transition of Care [facility]) - Progression Note    Patient Details  Name: Jermaine [name] MRN: [id number] Date of Birth: [birth year]  Transition of Care [facility]) CM/SW Contact  Dellie [place], [place] Phone Number: 10/23/2023, 12:59 PM  Clinical Narrative:   Met with pt who reports plan is to dc home with his wife Tammy. Pt called wife on phone during visit and she confirmed plan. Pt will dc to [address] [place], [place]. Pt states he has no one who can provide transportation at dc and unable to afford cost of taxi. Will provide pt with taxi voucher. MD updated.   Dellie Burns, MSW, LCSW [phone] (coverage)      Expected Discharge Plan: Skilled Nursing Facility Barriers to Discharge: Continued Medical Work up  Expected Discharge Plan and Services   Discharge Planning Services: CM Consult Post Acute Care Choice: IP Rehab Living arrangements for the past 2 months: Hotel/Motel                                       Social Determinants of Health (SDOH) Interventions SDOH Screenings   Food Insecurity: No Food Insecurity (10/20/2023)  Housing: Unknown (10/20/2023)  Recent Concern: Housing - High Risk (08/05/2023)  Transportation Needs: No Transportation Needs (10/20/2023)  Utilities: Not At Risk (10/20/2023)  Alcohol Screen: Low Risk  (02/26/2019)  Depression (PHQ2-9): Medium Risk (12/11/2020)  Financial Resource Strain: High Risk (10/19/2023)  Physical Activity: Insufficiently Active (08/25/2018)  Social Connections: Somewhat Isolated (08/25/2018)  Stress: Stress Concern Present (08/25/2018)  Tobacco Use: High Risk (10/19/2023)    Readmission Risk Interventions     No data to display

## 2023-10-23 NOTE — Progress Notes (Signed)
Patient had no typical spells captured during this admission and EEG was WNL. Neurology to sign off, patient may f/u with [facility] outpatient to see if he has had any further events. This will likely only need to be one appt given his exam had functional features and UDS was positive for amphetamines. If events become more frequent and are concerning clinically for seizures could consider dedicated EMU admission.  Bing Neighbors, MD [facility] [phone]  If 7pm- 7am, please page neurology on call as listed in AMION.

## 2023-10-23 NOTE — Discharge Summary (Signed)
Physician Discharge Summary  Jermaine [name]:[id number] DOB: [birth year] DOA: 10/19/2023  PCP: Benita Stabile, MD  Admit date: 10/19/2023 Discharge date: 10/23/2023  Time spent: 55 minutes  Recommendations for Outpatient Follow-up: 1 Follow-up with Benita Stabile, MD in 2 weeks.  On follow-up patient need a basic metabolic profile and magnesium level checked to follow-up on electrolytes and renal function.  Patient needed CBC done to follow-up on counts.  Patient's A-fib, hypertension and chronic medical issues will need to be followed up upon. Follow-up with [facility] in 4 weeks.   Discharge Diagnoses:  Principal Problem:   Acute focal neurological deficit Active Problems:   Essential hypertension   A-fib (HCC)   Mixed hyperlipidemia   Hypokalemia   Hypoalbuminemia due to protein-calorie malnutrition (HCC)   History of CVA (cerebrovascular accident)   Abnormal echocardiogram   Discharge Condition: Stable and improved.  Diet recommendation: Heart healthy  There were no vitals filed for this visit.  History of present illness:  HPI per Dr. Oscar La is a 43 y.o. male with medical history significant of CAD, type 2 diabetes, hyperlipidemia seizure disorder, chronic pain/anxiety disorder/bipolar disorder/depression who presents to the emergency department due to 2-day onset of seizure-like activities and increased in ear pressure.  Patient was seen at [facility] several times, while there yesterday, he had another seizure-like episode where he developed strokelike symptoms with facial droop, right-sided numbness and weakness.  Patient states that he was discharged home and that symptoms have been persistent since that time. He was admitted from 11/11 to 11/14 due to acute systolic CHF, multifocal ischemic infarct.   ED Course:  In the emergency department, BP was 140/130, other vital signs were within normal range.  Workup in the ED showed  normal CBC except for platelets of 133.  BMP was normal except for potassium of 3.2, albumin 3.12, alcohol level was less than 10. MRI head without contrast showed no evidence of acute intracranial abnormality CT Head and neck with and without contrast showed no emergent large vessel occlusion or proximal hemodynamically significant stenosis. Aspirin 375 mg x 1 was given, IV Keppra 1000 mg x 1 was given. Neurologist (Dr. Derry Lory) was consulted and recommended admitting patient to [facility] for further evaluation and management. Hospitalist was asked to admit patient for further evaluation and management.  Hospital Course:  #1 acute focal neurological deficits, rule out acute ischemic stroke/stroke ruled out. -Patient noted to have presented with some facial symmetry, concern for seizure-like activities. -CT angiogram head and neck done with no LVO or proximal hemodynamically significant stenosis. -MRI head done negative for any acute intracranial abnormality. -2D echo with EF of 40 to 45%, left ventricular global hypokinesis, grade 1 DD, no atrial level shunt detected by color-flow Doppler. -Patient with no signs or symptoms of infection. -UDS done positive for amphetamines and THC however patient adamantly denies any recent use of amphetamines. -Patient on LTM EEG, with EEG findings so far with no seizures or epileptiform discharges seen throughout the recording. -Patient was initially placed on aspirin, statin. -Patient being followed by neurology and no further inpatient workup recommended at this time per neurology and will need outpatient follow-up with neurology. -Patient seen by PT OT. -Patient will be discharged home with outpatient follow-up with PCP and neurology.   2.  Hypokalemia -Repleted during the hospitalization.    3.  Thrombocytopenia -Felt likely reactive. -Patient with no overt bleeding. -Thrombocytopenia had resolved by day of discharge.  4.  History of  CVA -Patient maintained on Crestor, Eliquis for secondary stroke prophylaxis. -Per Dr.Shah, patient noted to be noncompliant with his medications.   5.  A-fib with RVR -Patient maintained on Coreg for rate control.   -Eliquis for anticoagulation.   -Patient with history of noncompliance.  -Outpatient follow-up with PCP.   6.  Hypertension -Patient maintained on home regimen of losartan and Coreg that he was supposed to be on prior to admission.    7.  Hyperlipidemia -Patient placed on Crestor.    8.  Abnormal 2D echo --2D echo with EF of 40 to 45%, left ventricular global hypokinesis, grade 1 DD, no atrial level shunt detected by color-flow Doppler. -2D echo unchanged from prior 2D echo. -Patient noted to have a cardiac catheterization done 08/05/2023 with no angiographic evidence of CAD, normal LV filling pressures and recommendations at that time was no further ischemic workup. -Outpatient follow-up with PCP.  9.  Constipation -Patient maintained on bowel regimen of MiraLAX twice daily and Senokot-S twice daily. -Outpatient follow-up with PCP.  Procedures: LTM EEG CT angiogram head 10/19/2023 MRI brain 10/19/2023 2D echo 10/20/2023  Consultations: Neurology: Dr.Khaliqdina 10/20/2023    Discharge Exam: Vitals:   10/23/23 0800 10/23/23 1210  BP: (!) 118/99 134/88  Pulse: 86 97  Resp: 16 16  Temp: 98.5 F (36.9 C) 98.5 F (36.9 C)  SpO2: 98% 98%    General: NAD Cardiovascular: RRR no murmurs rubs or gallops.  No JVD.  No lower extremity edema. Respiratory: Clear to auscultation bilaterally.  No wheezes, no crackles, no rhonchi.  Fair air movement.  Speaking in full sentences.  Discharge Instructions   Discharge Instructions     Ambulatory referral to Neurology   Complete by: As directed    An appointment is requested in approximately: 4 weeks   Diet - low sodium heart healthy   Complete by: As directed    Increase activity slowly   Complete by: As directed     Increase activity slowly   Complete by: As directed       Allergies as of 10/23/2023       Reactions   Hydrocodone Itching   Seroquel [quetiapine] Other (See Comments)   Restless legs   Trazodone And Nefazodone Other (See Comments)   Restless legs        Medication List     TAKE these medications    acetaminophen 325 MG tablet Commonly known as: TYLENOL Take 2 tablets (650 mg total) by mouth every 6 (six) hours as needed for mild pain (pain score 1-3), moderate pain (pain score 4-6), fever or headache (or Fever >/= 101 high pain score (7-10)).   carvedilol 3.125 MG tablet Commonly known as: COREG Take 1 tablet (3.125 mg total) by mouth 2 (two) times daily with a meal.   Eliquis 5 MG Tabs tablet Generic drug: apixaban Take 1 tablet (5 mg total) by mouth 2 (two) times daily.   losartan 25 MG tablet Commonly known as: COZAAR Take 1 tablet (25 mg total) by mouth daily.   ondansetron 4 MG tablet Commonly known as: ZOFRAN Take 1 tablet (4 mg total) by mouth every 6 (six) hours as needed for nausea.   polyethylene glycol powder 17 GM/SCOOP powder Commonly known as: GLYCOLAX/MIRALAX Take 17 g (1 capful dissolved in water) by mouth 2 (two) times daily.   rosuvastatin 40 MG tablet Commonly known as: CRESTOR Take 1 tablet (40 mg total) by mouth daily.   Senna-S 8.6-50  MG tablet Generic drug: senna-docusate Take 1 tablet by mouth 2 (two) times daily.               Durable Medical Equipment  (From admission, onward)           Start     Ordered   10/22/23 1343  For home use only DME Walker rolling  Once       Question Answer Comment  Walker: With 5 Inch Wheels   Patient needs a walker to treat with the following condition Weakness      10/22/23 1342           Allergies  Allergen Reactions   Hydrocodone Itching   Seroquel [Quetiapine] Other (See Comments)    Restless legs   Trazodone And Nefazodone Other (See Comments)    Restless legs     Follow-up Information     [facility] Follow up.   Why: Please contact [facility] 2-3 days in advance to arrange needed transportation Contact information: [phone]        Benita Stabile, MD. Schedule an appointment as soon as possible for a visit in 2 week(s).   Specialty: Internal Medicine Why: call to set up referral to go to physical therapy Contact information: [address] [place] [facility] [address] [phone]         [facility] Follow up in 4 week(s).   Specialty: Neurology Contact information: [address] [place] [address] [phone]                 The results of significant diagnostics from this hospitalization (including imaging, microbiology, ancillary and laboratory) are listed below for reference.    Significant Diagnostic Studies: ECHOCARDIOGRAM COMPLETE Result Date: 10/20/2023    ECHOCARDIOGRAM REPORT   Patient Name:   Jermaine [name] Date of Exam: 10/20/2023 Medical Rec #:  [id number]      Height:       74.0 in Accession #:    [id number]     Weight:       207.6 lb Date of Birth:  22-May-1981      BSA:          2.208 m Patient Age:    42 years       BP:           149/84 mmHg Patient Gender: M              HR:           76 bpm. Exam Location:  Inpatient Procedure: 2D Echo, Cardiac Doppler and Color Doppler Indications:    Stroke  History:        Patient has prior history of Echocardiogram examinations, most                 recent 08/03/2023. CAD and Previous Myocardial Infarction; Risk                 Factors:Hypertension and Diabetes.  Sonographer:    Darlys Gales Referring Phys: [id number] OLADAPO ADEFESO IMPRESSIONS  1. Left ventricular ejection fraction, by estimation, is 40 to 45%. The left ventricle has mildly decreased function. The left ventricle demonstrates global hypokinesis. The left ventricular internal cavity size was mildly to moderately dilated. Left ventricular diastolic parameters  are consistent with Grade I diastolic dysfunction (impaired relaxation).  2. Right ventricular systolic function is normal. The right ventricular size is normal.  3. The mitral valve is normal in structure.  Trivial mitral valve regurgitation. No evidence of mitral stenosis.  4. The aortic valve is normal in structure. Aortic valve regurgitation is not visualized. No aortic stenosis is present.  5. The inferior vena cava is normal in size with greater than 50% respiratory variability, suggesting right atrial pressure of 3 mmHg. FINDINGS  Left Ventricle: Left ventricular ejection fraction, by estimation, is 40 to 45%. The left ventricle has mildly decreased function. The left ventricle demonstrates global hypokinesis. The left ventricular internal cavity size was mildly to moderately dilated. There is no left ventricular hypertrophy. Left ventricular diastolic parameters are consistent with Grade I diastolic dysfunction (impaired relaxation). Right Ventricle: The right ventricular size is normal. No increase in right ventricular wall thickness. Right ventricular systolic function is normal. Left Atrium: Left atrial size was normal in size. Right Atrium: Right atrial size was normal in size. Pericardium: There is no evidence of pericardial effusion. Mitral Valve: The mitral valve is normal in structure. Trivial mitral valve regurgitation. No evidence of mitral valve stenosis. Tricuspid Valve: The tricuspid valve is normal in structure. Tricuspid valve regurgitation is not demonstrated. No evidence of tricuspid stenosis. Aortic Valve: The aortic valve is normal in structure. Aortic valve regurgitation is not visualized. No aortic stenosis is present. Aortic valve mean gradient measures 3.0 mmHg. Aortic valve peak gradient measures 4.8 mmHg. Aortic valve area, by VTI measures 3.12 cm. Pulmonic Valve: The pulmonic valve was normal in structure. Pulmonic valve regurgitation is trivial. No evidence of pulmonic stenosis.  Aorta: The aortic root is normal in size and structure. Venous: The inferior vena cava is normal in size with greater than 50% respiratory variability, suggesting right atrial pressure of 3 mmHg. IAS/Shunts: No atrial level shunt detected by color flow Doppler.  LEFT VENTRICLE PLAX 2D LVIDd:         6.10 cm   Diastology LVIDs:         4.70 cm   LV e' medial:    5.98 cm/s LV PW:         0.90 cm   LV E/e' medial:  10.4 LV IVS:        0.90 cm   LV e' lateral:   5.98 cm/s LVOT diam:     2.10 cm   LV E/e' lateral: 10.4 LV SV:         65 LV SV Index:   29 LVOT Area:     3.46 cm  RIGHT VENTRICLE RV S prime:     10.40 cm/s TAPSE (M-mode): 2.7 cm LEFT ATRIUM             Index        RIGHT ATRIUM           Index LA Vol (A2C):   60.5 ml 27.40 ml/m  RA Area:     13.70 cm LA Vol (A4C):   25.2 ml 11.41 ml/m  RA Volume:   30.80 ml  13.95 ml/m LA Biplane Vol: 40.7 ml 18.43 ml/m  AORTIC VALVE AV Area (Vmax):    2.94 cm AV Area (Vmean):   2.90 cm AV Area (VTI):     3.12 cm AV Vmax:           110.00 cm/s AV Vmean:          84.200 cm/s AV VTI:            0.209 m AV Peak Grad:      4.8 mmHg AV Mean Grad:      3.0  mmHg LVOT Vmax:         93.40 cm/s LVOT Vmean:        70.600 cm/s LVOT VTI:          0.188 m LVOT/AV VTI ratio: 0.90 MITRAL VALVE MV Area (PHT): 2.66 cm    SHUNTS MV Decel Time: 285 msec    Systemic VTI:  0.19 m MV E velocity: 62.40 cm/s  Systemic Diam: 2.10 cm MV A velocity: 72.80 cm/s MV E/A ratio:  0.86 Arvilla Meres MD Electronically signed by Arvilla Meres MD Signature Date/Time: 10/20/2023/2:31:34 PM    Final    Overnight EEG with video Result Date: 10/20/2023 Charlsie Quest, MD     10/21/2023  9:32 AM Patient Name: Jermaine [name] MRN: [id number] Epilepsy Attending: Charlsie Quest Referring Physician/Provider: Erick Blinks, MD Duration: 10/20/2023 4098 to 10/21/2023 1191 Patient history: 43 y.o. male with hx of anxieety, CAD, HTN, HLD, OSA, ?seizures, emolib strokes and afibb on AC but non  compliance as he can't afford it. He presents with 2-3 months episodes of pressure in his head like he is on a roller coaster or going up the mountain, then he is dazed off. Lasts about 2-3 mins and then back to his baseline. They have been increasing in frequency and reports now having about 10 a day. EEG to evaluate for seizure Level of alertness: Awake, asleep AEDs during EEG study: None Technical aspects: This EEG study was done with scalp electrodes positioned according to the 10-20 International system of electrode placement. Electrical activity was reviewed with band pass filter of 1-70Hz , sensitivity of 7 uV/mm, display speed of 73mm/sec with a 60Hz  notched filter applied as appropriate. EEG data were recorded continuously and digitally stored.  Video monitoring was available and reviewed as appropriate. Description: The posterior dominant rhythm consists of 8 Hz activity of moderate voltage (25-35 uV) seen predominantly in posterior head regions, symmetric and reactive to eye opening and eye closing. Sleep was characterized by vertex waves, sleep spindles (12 to 14 Hz), maximal frontocentral region. Hyperventilation and photic stimulation were not performed.   IMPRESSION: This study is within normal limits. No seizures or epileptiform discharges were seen throughout the recording. A normal interictal EEG does not exclude the diagnosis of epilepsy. Charlsie Quest   MR BRAIN WO CONTRAST Result Date: 10/19/2023 CLINICAL DATA:  Neuro deficit, acute, stroke suspected EXAM: MRI HEAD WITHOUT CONTRAST TECHNIQUE: Multiplanar, multiecho pulse sequences of the brain and surrounding structures were obtained without intravenous contrast. COMPARISON:  MRI August 03, 2023 FINDINGS: Brain: No acute infarction, hemorrhage, hydrocephalus, extra-axial collection or mass lesion. Small remote left cerebellar infarct. Vascular: Major arterial flow voids are maintained. Skull and upper cervical spine: Normal marrow signal.  Sinuses/Orbits: Mild paranasal sinus mucosal thickening. No acute orbital findings. Other: No mastoid effusions. IMPRESSION: No evidence of acute intracranial abnormality. Electronically Signed   By: Feliberto Harts M.D.   On: 10/19/2023 21:48   CT ANGIO HEAD NECK W WO CM Result Date: 10/19/2023 CLINICAL DATA:  Neuro deficit, acute, stroke suspected EXAM: CT ANGIOGRAPHY HEAD AND NECK WITH AND WITHOUT CONTRAST TECHNIQUE: Multidetector CT imaging of the head and neck was performed using the standard protocol during bolus administration of intravenous contrast. Multiplanar CT image reconstructions and MIPs were obtained to evaluate the vascular anatomy. Carotid stenosis measurements (when applicable) are obtained utilizing NASCET criteria, using the distal internal carotid diameter as the denominator. RADIATION DOSE REDUCTION: This exam was performed according to the departmental dose-optimization program which  includes automated exposure control, adjustment of the mA and/or kV according to patient size and/or use of iterative reconstruction technique. CONTRAST:  75mL OMNIPAQUE IOHEXOL 350 MG/ML SOLN COMPARISON:  None Available. FINDINGS: CT HEAD FINDINGS Brain: No evidence of acute infarction, hemorrhage, hydrocephalus, extra-axial collection or mass lesion/mass effect. Vascular: See below. Skull: No acute fracture. Sinuses/Orbits: Clear sinuses.  No acute orbital findings. Other: No mastoid effusions. Review of the MIP images confirms the above findings CTA NECK FINDINGS Aortic arch: Great vessel origins are patent without significant stenosis. Right carotid system: No evidence of dissection, stenosis (50% or greater), or occlusion. Left carotid system: No evidence of dissection, stenosis (50% or greater), or occlusion. Vertebral arteries: Right dominant. No evidence of dissection, stenosis (50% or greater), or occlusion. Skeleton: No acute abnormality on limited assessment. Other neck: No acute abnormality on  limited assessment. Upper chest: Visualized lung apices are clear. Review of the MIP images confirms the above findings CTA HEAD FINDINGS Anterior circulation: Bilateral intracranial ICAs, MCAs, and ACAs are patent without proximal hemodynamically significant stenosis. Posterior circulation: Bilateral intradural vertebral arteries, basilar artery and bilateral posterior cerebral arteries are patent without proximal hemodynamically significant stenosis. Venous sinuses: As permitted by contrast timing, patent. Review of the MIP images confirms the above findings IMPRESSION: 1. No emergent large vessel occlusion or proximal hemodynamically significant stenosis. 2. No evidence of acute intracranial abnormality. Electronically Signed   By: Feliberto Harts M.D.   On: 10/19/2023 19:26    Microbiology: No results found for this or any previous visit (from the past 240 hours).   Labs: Basic Metabolic Panel: Recent Labs  Lab 10/19/23 1731 10/20/23 0537 10/21/23 0601 10/22/23 0633 10/23/23 0622  NA 138 138 138 137 137  K 3.2* 3.2* 3.1* 3.5 3.7  CL 105 105 103 102 104  CO2 23 23 25 24 22   GLUCOSE 89 97 89 85 89  BUN 11 10 9 13 17   CREATININE 1.12 1.12 1.12 0.85 1.05  CALCIUM 8.0* 7.9* 8.1* 8.7* 9.1  MG  --  1.9 1.8 2.0  --   PHOS  --  3.1  --   --   --    Liver Function Tests: Recent Labs  Lab 10/19/23 1731 10/20/23 0537  AST 17 16  ALT 15 13  ALKPHOS 46 46  BILITOT 0.7 0.5  PROT 5.9* 5.5*  ALBUMIN 3.4* 3.0*   No results for input(s): "LIPASE", "AMYLASE" in the last 168 hours. No results for input(s): "AMMONIA" in the last 168 hours. CBC: Recent Labs  Lab 10/19/23 1731 10/20/23 0537 10/21/23 0601 10/22/23 0633 10/23/23 0622  WBC 4.0 3.2* 4.9 8.1 6.0  NEUTROABS 2.9  --   --   --   --   HGB 14.0 12.8* 13.7 15.0 15.2  HCT 39.1 36.0* 38.5* 41.2 41.3  MCV 94.2 91.8 92.3 91.6 91.4  PLT 133* 131* 141* 172 185   Cardiac Enzymes: No results for input(s): "CKTOTAL", "CKMB",  "CKMBINDEX", "TROPONINI" in the last 168 hours. BNP: BNP (last 3 results) No results for input(s): "BNP" in the last 8760 hours.  ProBNP (last 3 results) No results for input(s): "PROBNP" in the last 8760 hours.  CBG: No results for input(s): "GLUCAP" in the last 168 hours.     Signed:  Ramiro Harvest MD.  [facility] 10/23/2023, 2:22 PM

## 2023-10-23 NOTE — Progress Notes (Signed)
AVS instructions given, he verbalized understanding. Patient ready for discharge

## 2023-10-26 ENCOUNTER — Encounter: Payer: Self-pay | Admitting: Neurology

## 2023-10-26 ENCOUNTER — Institutional Professional Consult (permissible substitution): Payer: MEDICAID | Admitting: Neurology

## 2023-11-10 ENCOUNTER — Ambulatory Visit: Payer: MEDICAID | Admitting: Student

## 2023-12-17 ENCOUNTER — Other Ambulatory Visit (HOSPITAL_COMMUNITY): Payer: Self-pay

## 2023-12-17 ENCOUNTER — Other Ambulatory Visit: Payer: Self-pay

## 2024-01-26 ENCOUNTER — Other Ambulatory Visit (HOSPITAL_COMMUNITY): Payer: Self-pay

## 2024-01-27 ENCOUNTER — Other Ambulatory Visit (HOSPITAL_COMMUNITY): Payer: Self-pay

## 2024-02-08 ENCOUNTER — Other Ambulatory Visit (HOSPITAL_COMMUNITY): Payer: Self-pay

## 2024-05-12 ENCOUNTER — Encounter: Payer: Self-pay | Admitting: Radiology

## 2024-07-11 ENCOUNTER — Other Ambulatory Visit (HOSPITAL_COMMUNITY): Payer: Self-pay

## 2024-07-24 ENCOUNTER — Encounter: Payer: Self-pay | Admitting: Radiology

## 2024-09-13 ENCOUNTER — Other Ambulatory Visit (HOSPITAL_COMMUNITY): Payer: Self-pay
